# Patient Record
Sex: Female | Born: 1985 | Race: Black or African American | Hispanic: No | Marital: Married | Smoking: Former smoker
Health system: Southern US, Community
[De-identification: ages and names within clinical notes are randomized; demographics above are authoritative.]

## PROBLEM LIST (undated history)

## (undated) DIAGNOSIS — N920 Excessive and frequent menstruation with regular cycle: Secondary | ICD-10-CM

## (undated) DIAGNOSIS — R0600 Dyspnea, unspecified: Secondary | ICD-10-CM

## (undated) DIAGNOSIS — I499 Cardiac arrhythmia, unspecified: Secondary | ICD-10-CM

## (undated) DIAGNOSIS — Z5189 Encounter for other specified aftercare: Secondary | ICD-10-CM

## (undated) DIAGNOSIS — F32A Depression, unspecified: Secondary | ICD-10-CM

## (undated) DIAGNOSIS — J45909 Unspecified asthma, uncomplicated: Secondary | ICD-10-CM

## (undated) DIAGNOSIS — Z8489 Family history of other specified conditions: Secondary | ICD-10-CM

## (undated) DIAGNOSIS — G935 Compression of brain: Secondary | ICD-10-CM

## (undated) DIAGNOSIS — T8859XA Other complications of anesthesia, initial encounter: Secondary | ICD-10-CM

## (undated) DIAGNOSIS — K219 Gastro-esophageal reflux disease without esophagitis: Secondary | ICD-10-CM

## (undated) DIAGNOSIS — E236 Other disorders of pituitary gland: Secondary | ICD-10-CM

## (undated) DIAGNOSIS — F419 Anxiety disorder, unspecified: Secondary | ICD-10-CM

## (undated) DIAGNOSIS — D649 Anemia, unspecified: Secondary | ICD-10-CM

## (undated) HISTORY — DX: Anemia, unspecified: D64.9

## (undated) HISTORY — DX: Excessive and frequent menstruation with regular cycle: N92.0

## (undated) HISTORY — PX: TUBAL LIGATION: SHX77

## (undated) HISTORY — PX: CHOLECYSTECTOMY: SHX55

---
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
## 2003-03-16 ENCOUNTER — Encounter (HOSPITAL_BASED_OUTPATIENT_CLINIC_OR_DEPARTMENT_OTHER): Payer: Self-pay | Admitting: General Surgery

## 2003-03-16 ENCOUNTER — Encounter: Admission: RE | Admit: 2003-03-16 | Discharge: 2003-03-16 | Payer: Self-pay | Admitting: General Surgery

## 2003-06-08 ENCOUNTER — Inpatient Hospital Stay (HOSPITAL_COMMUNITY): Admission: AD | Admit: 2003-06-08 | Discharge: 2003-06-08 | Payer: Self-pay | Admitting: Obstetrics and Gynecology

## 2003-07-22 ENCOUNTER — Inpatient Hospital Stay (HOSPITAL_COMMUNITY): Admission: AD | Admit: 2003-07-22 | Discharge: 2003-07-22 | Payer: Self-pay | Admitting: Obstetrics & Gynecology

## 2003-12-20 ENCOUNTER — Inpatient Hospital Stay (HOSPITAL_COMMUNITY): Admission: AD | Admit: 2003-12-20 | Discharge: 2003-12-20 | Payer: Self-pay | Admitting: *Deleted

## 2003-12-21 ENCOUNTER — Inpatient Hospital Stay (HOSPITAL_COMMUNITY): Admission: AD | Admit: 2003-12-21 | Discharge: 2003-12-21 | Payer: Self-pay | Admitting: Obstetrics and Gynecology

## 2004-03-08 ENCOUNTER — Inpatient Hospital Stay (HOSPITAL_COMMUNITY): Admission: AD | Admit: 2004-03-08 | Discharge: 2004-03-08 | Payer: Self-pay | Admitting: *Deleted

## 2004-03-13 ENCOUNTER — Inpatient Hospital Stay (HOSPITAL_COMMUNITY): Admission: AD | Admit: 2004-03-13 | Discharge: 2004-03-13 | Payer: Self-pay | Admitting: *Deleted

## 2004-05-14 ENCOUNTER — Emergency Department (HOSPITAL_COMMUNITY): Admission: EM | Admit: 2004-05-14 | Discharge: 2004-05-14 | Payer: Self-pay | Admitting: Emergency Medicine

## 2004-05-30 ENCOUNTER — Ambulatory Visit (HOSPITAL_COMMUNITY): Admission: RE | Admit: 2004-05-30 | Discharge: 2004-05-30 | Payer: Self-pay | Admitting: Emergency Medicine

## 2004-05-30 ENCOUNTER — Inpatient Hospital Stay (HOSPITAL_COMMUNITY): Admission: EM | Admit: 2004-05-30 | Discharge: 2004-06-01 | Payer: Self-pay | Admitting: Emergency Medicine

## 2004-05-31 ENCOUNTER — Encounter (INDEPENDENT_AMBULATORY_CARE_PROVIDER_SITE_OTHER): Payer: Self-pay | Admitting: *Deleted

## 2004-06-02 ENCOUNTER — Emergency Department (HOSPITAL_COMMUNITY): Admission: EM | Admit: 2004-06-02 | Discharge: 2004-06-02 | Payer: Self-pay | Admitting: Family Medicine

## 2004-06-25 ENCOUNTER — Emergency Department (HOSPITAL_COMMUNITY): Admission: EM | Admit: 2004-06-25 | Discharge: 2004-06-25 | Payer: Self-pay | Admitting: Emergency Medicine

## 2004-09-12 ENCOUNTER — Emergency Department (HOSPITAL_COMMUNITY): Admission: EM | Admit: 2004-09-12 | Discharge: 2004-09-12 | Payer: Self-pay | Admitting: Family Medicine

## 2005-02-17 ENCOUNTER — Emergency Department (HOSPITAL_COMMUNITY): Admission: EM | Admit: 2005-02-17 | Discharge: 2005-02-17 | Payer: Self-pay | Admitting: Family Medicine

## 2005-02-19 ENCOUNTER — Emergency Department (HOSPITAL_COMMUNITY): Admission: EM | Admit: 2005-02-19 | Discharge: 2005-02-19 | Payer: Self-pay | Admitting: Family Medicine

## 2005-03-10 ENCOUNTER — Emergency Department (HOSPITAL_COMMUNITY): Admission: EM | Admit: 2005-03-10 | Discharge: 2005-03-10 | Payer: Self-pay | Admitting: Family Medicine

## 2005-04-19 ENCOUNTER — Inpatient Hospital Stay (HOSPITAL_COMMUNITY): Admission: AD | Admit: 2005-04-19 | Discharge: 2005-04-19 | Payer: Self-pay | Admitting: Obstetrics and Gynecology

## 2005-04-23 ENCOUNTER — Inpatient Hospital Stay (HOSPITAL_COMMUNITY): Admission: AD | Admit: 2005-04-23 | Discharge: 2005-04-23 | Payer: Self-pay | Admitting: *Deleted

## 2005-06-20 ENCOUNTER — Inpatient Hospital Stay (HOSPITAL_COMMUNITY): Admission: AD | Admit: 2005-06-20 | Discharge: 2005-06-20 | Payer: Self-pay | Admitting: Obstetrics & Gynecology

## 2005-08-30 ENCOUNTER — Inpatient Hospital Stay (HOSPITAL_COMMUNITY): Admission: AD | Admit: 2005-08-30 | Discharge: 2005-08-30 | Payer: Self-pay | Admitting: Obstetrics and Gynecology

## 2005-10-08 ENCOUNTER — Inpatient Hospital Stay (HOSPITAL_COMMUNITY): Admission: AD | Admit: 2005-10-08 | Discharge: 2005-10-08 | Payer: Self-pay | Admitting: *Deleted

## 2005-10-09 ENCOUNTER — Inpatient Hospital Stay (HOSPITAL_COMMUNITY): Admission: AD | Admit: 2005-10-09 | Discharge: 2005-10-09 | Payer: Self-pay | Admitting: Gynecology

## 2005-11-26 ENCOUNTER — Inpatient Hospital Stay (HOSPITAL_COMMUNITY): Admission: AD | Admit: 2005-11-26 | Discharge: 2005-11-26 | Payer: Self-pay | Admitting: Gynecology

## 2005-12-02 ENCOUNTER — Ambulatory Visit (HOSPITAL_COMMUNITY): Admission: RE | Admit: 2005-12-02 | Discharge: 2005-12-02 | Payer: Self-pay | Admitting: Gynecology

## 2005-12-09 ENCOUNTER — Ambulatory Visit: Payer: Self-pay | Admitting: Obstetrics & Gynecology

## 2006-01-30 ENCOUNTER — Inpatient Hospital Stay (HOSPITAL_COMMUNITY): Admission: AD | Admit: 2006-01-30 | Discharge: 2006-01-30 | Payer: Self-pay | Admitting: Obstetrics and Gynecology

## 2006-01-30 ENCOUNTER — Ambulatory Visit: Payer: Self-pay | Admitting: *Deleted

## 2006-02-27 ENCOUNTER — Ambulatory Visit: Payer: Self-pay | Admitting: Obstetrics & Gynecology

## 2006-03-26 ENCOUNTER — Inpatient Hospital Stay (HOSPITAL_COMMUNITY): Admission: AD | Admit: 2006-03-26 | Discharge: 2006-03-26 | Payer: Self-pay | Admitting: Gynecology

## 2006-04-12 ENCOUNTER — Inpatient Hospital Stay (HOSPITAL_COMMUNITY): Admission: AD | Admit: 2006-04-12 | Discharge: 2006-04-12 | Payer: Self-pay | Admitting: Obstetrics & Gynecology

## 2006-04-12 ENCOUNTER — Ambulatory Visit: Payer: Self-pay | Admitting: Obstetrics and Gynecology

## 2006-04-13 ENCOUNTER — Inpatient Hospital Stay (HOSPITAL_COMMUNITY): Admission: AD | Admit: 2006-04-13 | Discharge: 2006-04-13 | Payer: Self-pay | Admitting: Obstetrics & Gynecology

## 2006-04-13 ENCOUNTER — Ambulatory Visit: Payer: Self-pay | Admitting: Obstetrics and Gynecology

## 2006-04-14 ENCOUNTER — Inpatient Hospital Stay (HOSPITAL_COMMUNITY): Admission: AD | Admit: 2006-04-14 | Discharge: 2006-04-16 | Payer: Self-pay | Admitting: Obstetrics & Gynecology

## 2006-04-14 ENCOUNTER — Ambulatory Visit: Payer: Self-pay | Admitting: Gynecology

## 2006-06-24 ENCOUNTER — Inpatient Hospital Stay (HOSPITAL_COMMUNITY): Admission: AD | Admit: 2006-06-24 | Discharge: 2006-06-24 | Payer: Self-pay | Admitting: Family Medicine

## 2007-01-01 ENCOUNTER — Inpatient Hospital Stay (HOSPITAL_COMMUNITY): Admission: AD | Admit: 2007-01-01 | Discharge: 2007-01-01 | Payer: Self-pay | Admitting: Obstetrics and Gynecology

## 2007-02-13 ENCOUNTER — Emergency Department (HOSPITAL_COMMUNITY): Admission: EM | Admit: 2007-02-13 | Discharge: 2007-02-13 | Payer: Self-pay | Admitting: Emergency Medicine

## 2007-06-08 ENCOUNTER — Emergency Department (HOSPITAL_COMMUNITY): Admission: EM | Admit: 2007-06-08 | Discharge: 2007-06-08 | Payer: Self-pay | Admitting: Family Medicine

## 2007-07-29 ENCOUNTER — Inpatient Hospital Stay (HOSPITAL_COMMUNITY): Admission: AD | Admit: 2007-07-29 | Discharge: 2007-07-29 | Payer: Self-pay | Admitting: Obstetrics and Gynecology

## 2007-12-10 ENCOUNTER — Emergency Department (HOSPITAL_COMMUNITY): Admission: EM | Admit: 2007-12-10 | Discharge: 2007-12-10 | Payer: Self-pay | Admitting: Family Medicine

## 2008-01-21 ENCOUNTER — Emergency Department (HOSPITAL_COMMUNITY): Admission: EM | Admit: 2008-01-21 | Discharge: 2008-01-21 | Payer: Self-pay | Admitting: Family Medicine

## 2008-01-22 ENCOUNTER — Emergency Department (HOSPITAL_COMMUNITY): Admission: EM | Admit: 2008-01-22 | Discharge: 2008-01-22 | Payer: Self-pay | Admitting: Emergency Medicine

## 2008-02-06 ENCOUNTER — Inpatient Hospital Stay (HOSPITAL_COMMUNITY): Admission: AD | Admit: 2008-02-06 | Discharge: 2008-02-06 | Payer: Self-pay | Admitting: Family Medicine

## 2008-02-08 ENCOUNTER — Inpatient Hospital Stay (HOSPITAL_COMMUNITY): Admission: AD | Admit: 2008-02-08 | Discharge: 2008-02-08 | Payer: Self-pay | Admitting: Family Medicine

## 2008-02-10 ENCOUNTER — Inpatient Hospital Stay (HOSPITAL_COMMUNITY): Admission: AD | Admit: 2008-02-10 | Discharge: 2008-02-10 | Payer: Self-pay | Admitting: Family Medicine

## 2008-02-10 ENCOUNTER — Encounter: Payer: Self-pay | Admitting: Family Medicine

## 2008-03-30 ENCOUNTER — Emergency Department (HOSPITAL_COMMUNITY): Admission: EM | Admit: 2008-03-30 | Discharge: 2008-03-30 | Payer: Self-pay | Admitting: Family Medicine

## 2008-07-17 ENCOUNTER — Emergency Department (HOSPITAL_COMMUNITY): Admission: EM | Admit: 2008-07-17 | Discharge: 2008-07-17 | Payer: Self-pay | Admitting: Family Medicine

## 2009-04-11 ENCOUNTER — Inpatient Hospital Stay (HOSPITAL_COMMUNITY): Admission: AD | Admit: 2009-04-11 | Discharge: 2009-04-13 | Payer: Self-pay | Admitting: Obstetrics and Gynecology

## 2009-04-12 ENCOUNTER — Encounter (INDEPENDENT_AMBULATORY_CARE_PROVIDER_SITE_OTHER): Payer: Self-pay | Admitting: Obstetrics and Gynecology

## 2009-06-13 ENCOUNTER — Emergency Department (HOSPITAL_COMMUNITY): Admission: EM | Admit: 2009-06-13 | Discharge: 2009-06-13 | Payer: Self-pay | Admitting: Emergency Medicine

## 2009-06-28 ENCOUNTER — Emergency Department (HOSPITAL_COMMUNITY): Admission: EM | Admit: 2009-06-28 | Discharge: 2009-06-28 | Payer: Self-pay | Admitting: Emergency Medicine

## 2009-11-09 ENCOUNTER — Emergency Department (HOSPITAL_COMMUNITY): Admission: EM | Admit: 2009-11-09 | Discharge: 2009-11-09 | Payer: Self-pay | Admitting: Emergency Medicine

## 2009-12-09 ENCOUNTER — Emergency Department (HOSPITAL_COMMUNITY): Admission: EM | Admit: 2009-12-09 | Discharge: 2009-12-09 | Payer: Self-pay | Admitting: Family Medicine

## 2009-12-26 ENCOUNTER — Emergency Department (HOSPITAL_COMMUNITY): Admission: EM | Admit: 2009-12-26 | Discharge: 2009-12-26 | Payer: Self-pay | Admitting: Emergency Medicine

## 2010-04-16 ENCOUNTER — Emergency Department (HOSPITAL_COMMUNITY): Admission: EM | Admit: 2010-04-16 | Discharge: 2010-04-16 | Payer: Self-pay | Admitting: Emergency Medicine

## 2010-05-28 ENCOUNTER — Emergency Department (HOSPITAL_COMMUNITY)
Admission: EM | Admit: 2010-05-28 | Discharge: 2010-05-28 | Payer: Self-pay | Source: Home / Self Care | Admitting: Emergency Medicine

## 2010-07-16 ENCOUNTER — Encounter: Payer: Self-pay | Admitting: Family Medicine

## 2010-08-16 ENCOUNTER — Emergency Department (HOSPITAL_COMMUNITY)
Admission: EM | Admit: 2010-08-16 | Discharge: 2010-08-16 | Disposition: A | Discharge status: Home / Self Care | Payer: Medicaid Other | Attending: Emergency Medicine | Admitting: Emergency Medicine

## 2010-08-16 DIAGNOSIS — X503XXA Overexertion from repetitive movements, initial encounter: Secondary | ICD-10-CM | POA: Insufficient documentation

## 2010-08-16 DIAGNOSIS — M545 Low back pain, unspecified: Secondary | ICD-10-CM | POA: Insufficient documentation

## 2010-08-16 DIAGNOSIS — S335XXA Sprain of ligaments of lumbar spine, initial encounter: Secondary | ICD-10-CM | POA: Insufficient documentation

## 2010-08-16 DIAGNOSIS — K219 Gastro-esophageal reflux disease without esophagitis: Secondary | ICD-10-CM | POA: Insufficient documentation

## 2010-08-16 DIAGNOSIS — Y990 Civilian activity done for income or pay: Secondary | ICD-10-CM | POA: Insufficient documentation

## 2010-09-09 LAB — POCT PREGNANCY, URINE: Preg Test, Ur: NEGATIVE

## 2010-09-10 LAB — RAPID STREP SCREEN (MED CTR MEBANE ONLY): Streptococcus, Group A Screen (Direct): NEGATIVE

## 2010-09-27 LAB — CBC
HCT: 34 % — ABNORMAL LOW (ref 36.0–46.0)
Hemoglobin: 11.2 g/dL — ABNORMAL LOW (ref 12.0–15.0)
MCHC: 32.9 g/dL (ref 30.0–36.0)
Platelets: 165 10*3/uL (ref 150–400)
RBC: 4.58 MIL/uL (ref 3.87–5.11)
WBC: 5.6 10*3/uL (ref 4.0–10.5)
WBC: 5.9 10*3/uL (ref 4.0–10.5)

## 2010-09-27 LAB — RPR: RPR Ser Ql: NONREACTIVE

## 2010-11-09 NOTE — Op Note (Signed)
NAMESUSSIE, MINOR           ACCOUNT NO.:  [id number]   MEDICAL RECORD NO.:  [id number]          PATIENT TYPE:  OUT   LOCATION:  ULT                          FACILITY:  [facility]   PHYSICIAN:  Sandria Bales. Ezzard Standing, M.D.  DATE OF BIRTH:  July 29, 1985   DATE OF PROCEDURE:  05/31/2004  DATE OF DISCHARGE:                                 OPERATIVE REPORT   PREOPERATIVE DIAGNOSIS:  Cholecystitis with cholelithiasis.   POSTOPERATIVE DIAGNOSIS:  Cholecystitis with cholelithiasis.   PROCEDURE:  Laparoscopic cholecystectomy with interoperative cholangiogram,  common bile duct exploration with #4 Fogarty catheter.   SURGEON:  Sandria Bales. Ezzard Standing, M.D.   ASSISTANT:  Gabrielle Dare. Janee Morn, M.D.   ANESTHESIA:  General endotracheal anesthesia.   ESTIMATED BLOOD LOSS:  Minimal.   INDICATIONS FOR PROCEDURE:  Ms. Kamen is a 25 year old black female who  was admitted last evening by Dr. Jimmye Norman with right upper quadrant pain,  cholelithiasis by ultrasound, signs and symptoms consistent with acute  cholecystitis and cholelithiasis.  Dr. Lindie Spruce was tied up this morning and  asked if I could do the operation.   DESCRIPTION OF PROCEDURE:  The patient presents to the operating room and  underwent a general endotracheal anesthetic by Dr. Jairo Ben.  She  was given 1 gram Ancef at the initiation of the procedure.  She had PA  stockings in place.  The abdomen was prepped with Betadine solution and  sterilely draped.  I went through an infraumbilical incision with sharp  dissection carried down to the abdominal cavity.  A 0 degree 10 mm Hasson  trocar was inserted through the umbilical incision and secured with a 0  Vicryl suture.  A 10 mm 0 degree laparoscope was inserted into the abdomen  and abdominal exploration carried out.   Right and left lobes of the liver were unremarkable.  The stomach was  unremarkable.  The remainder of the bowel that I could see was unremarkable.  Three additional  trocars were placed, a 10 mm subxiphoid trocar, a 5 mm  right subcostal, and 5 mm right subcostal.  The gallbladder was grasped and  rotated cephalad.  It was tense and maybe mildly edematous.  What was  impressive was actually the size of her common bile duct which I could see  laparoscopically.  I went on and dissected out the cystic artery which I  doubly endoclipped and divided.  I dissected out the cystic duct and placed  a clip on the gallbladder side of the cystic duct.   I shot an interoperative cholangiogram.  My first interoperative  cholangiogram did not show emptying in the common bile duct, actually the  common bile duct did not empty.  I gave her a mg of Glucagon, waited 10  minutes, shot another cholangiogram, again, the common bile duct did not  empty.  I then used a #4 Fogarty catheter that I passed down into the  duodenum and pulled it back through the ampulla.  At this time, I shot a  cholangiogram that showed, again, dilated common bile duct that emptied into  the duodenum.  The ampulla was fairly  long, it emptied to the third portion  of the duodenum and there was no filling debris noted.  This was felt to be  a normal interoperative cholangiogram.   The cystic duct was endoclipped and divided.  The gallbladder  was then  sharply and bluntly dissected from the gallbladder bed using primarily hook  Bovie coagulation.  Prior to complete division of the gallbladder from the  gallbladder bed, I revisualized the triangle of Calot, revisualized the  gallbladder bed, there is no bleeding and no bile leak.  I then divided the  gallbladder from the liver, placed it in an endocatch bag, and delivered it  through the umbilicus.  The abdomen was irrigated with about 2 liters of  saline.  I resinpected the bed and triangle of Calot and saw no bleeding or  bile leak.   The umbilical port was then closed with a 0 Vicryl suture.  I injected about  15 mL of  0.25% Marcaine.  The  skin at each site was closed with 5-0 Vicryl,  painted with tincture of Benzoin, and covered with Steri-Strips.  The  patient tolerated the procedure well and was transported to the recovery  room in good condition.  Sponge and needle counts were correct at the end of  the case.      Pervis Hocking   DHN/MEDQ  D:  05/31/2004  T:  05/31/2004  Job:  [id number]

## 2010-11-09 NOTE — Discharge Summary (Signed)
Breanna Miranda, Breanna Miranda           ACCOUNT NO.:  [id number]   MEDICAL RECORD NO.:  [id number]          PATIENT TYPE:  INP   LOCATION:  5731                         FACILITY:  [facility]   PHYSICIAN:  Sandria Bales. Ezzard Standing, M.D.  DATE OF BIRTH:  28-Oct-1985   DATE OF ADMISSION:  05/30/2004  DATE OF DISCHARGE:  06/01/2004                                 DISCHARGE SUMMARY   DISCHARGE DIAGNOSES:  1.  Cholecystitis with cholelithiasis.  2.  Common bile duct stone.   OPERATION PERFORMED:  Laparoscopic cholecystectomy with intraoperative  cholangiogram and common bile duct exploration with #4 biliary Fogarty on  05/31/2004.   HISTORY OF ILLNESS:  This is a 25 year old black female who was admitted on  05/30/2004, with biliary colic, cholelithiasis, right upper quadrant pain,  and signs and symptoms consistent with cholecystitis.   She had normal liver functions at that time.  She was originally admitted by  Dr. Megan Mans.  He was occupied and asked if I could proceed to do the case.   HOSPITAL COURSE:  I met and saw the patient on 05/31/2004, at which time, I  took her to the operating room.  She underwent a laparoscopic  cholecystectomy with intraoperative cholangiogram.  Postoperatively, she did  well.  By the first postoperative day, she was sore but afebrile.  Her  abdomen was soft, and she was ready for discharge.   DISCHARGE INSTRUCTIONS:  Vicodin for pain.  She is to return to the office  in two to three weeks.  Low-fat diet.   DISCHARGE CONDITION:  Good.   OF NOTE, THERE IS NO PATHOLOGY REPORT ON THE CHART AT THE TIME OF THIS  DICTATION.      DHN/MEDQ  D:  08/23/2004  T:  08/23/2004  Job:  [id number]

## 2010-11-09 NOTE — H&P (Signed)
NAMESICILY, ZARAGOZA           ACCOUNT NO.:  [id number]   MEDICAL RECORD NO.:  [id number]          PATIENT TYPE:  OUT   LOCATION:  ULT                          FACILITY:  [facility]   PHYSICIAN:  Jimmye Norman III, M.D.  DATE OF BIRTH:  Jul 09, 1985   DATE OF ADMISSION:  05/30/2004  DATE OF DISCHARGE:                                HISTORY & PHYSICAL   IDENTIFICATION/CHIEF COMPLAINT:  The patient is a 25 year old with biliary  colic and possibly early acute cholecystitis.   HISTORY OF PRESENT ILLNESS:  The patient has been having symptoms for about  24-36 hours.  It started off in the epigastrium with what she thought was  her gastroesophageal reflux disease problem which she does take intermittent  medications for; however, over the next day it did not subside and got to  the point where it was bad enough for her to go into the [facility]  on [facility].  There an ultrasound was done which showed  cholelithiasis, perhaps an impaction in the gallbladder wall, and maybe some  early acute cholecystitis changes.  Surgery was asked to see her.   PAST MEDICAL HISTORY:  1.  Unremarkable for hypertension, diabetes, heart disease.  2.  It is questionable whether or not she is a smoker.  3.  She has had no surgery.  4.  She is fertile, has two children, one 3 and one 1, both were delivered      by a normal spontaneous vaginal delivery.   PREVIOUS HOSPITALIZATION:  She was apparently hospitalized once before for  her GERD disease but otherwise has been fine.   MEDICATIONS:  She takes no medications normally.   She has no known drug allergies.   REVIEW OF SYSTEMS:  She has had nausea and vomiting and also severe pain.  No constipation, no blood in the stools, no chest pain, no shortness of  breath.   PHYSICAL EXAMINATION:  VITAL SIGNS:  She is afebrile.  Her other vital signs  are stable.  HEENT:  She is normocephalic and atraumatic.  She has no scleral icterus.  NECK:  Supple with no palpable masses and no bruits.  CHEST:  Clear to auscultation and percussion.  CARDIAC:  Regular rhythm and rate with no murmurs, gallops, __________  or  heaves.  ABDOMEN:  Soft but she is tender in the right upper quadrant.  She has  active bowel sounds.  PELVIC:  Not performed.  RECTAL:  Not performed.   LABORATORY STUDIES:  Were reviewed.  Her hemoglobin was in the 11-12 range.  Her hematocrit was about 34%.  Her liver function tests were normal as were  her amylase and lipase.  Her white blood cell count was only 5.2.   IMPRESSION:  1.  Symptomatic cholelithiasis, biliary colic, and possibly early acute      cholecystitis in an otherwise healthy 25 year old female.  2.  She is moderately obese also.   PLAN:  1.  Admit her.  2.  Start her on IV antibiotics.  3.  Take out her gallbladder laparoscopically as soon as possible.  JW/MEDQ  D:  05/31/2004  T:  05/31/2004  Job:  [id number]

## 2011-01-24 ENCOUNTER — Inpatient Hospital Stay (INDEPENDENT_AMBULATORY_CARE_PROVIDER_SITE_OTHER)
Admission: RE | Admit: 2011-01-24 | Discharge: 2011-01-24 | Disposition: A | Discharge status: Home / Self Care | Payer: Self-pay | Source: Ambulatory Visit | Attending: Family Medicine | Admitting: Family Medicine

## 2011-01-24 DIAGNOSIS — T148 Other injury of unspecified body region: Secondary | ICD-10-CM

## 2011-01-24 DIAGNOSIS — L259 Unspecified contact dermatitis, unspecified cause: Secondary | ICD-10-CM

## 2011-03-15 LAB — POCT PREGNANCY, URINE
Operator id: 280671
Preg Test, Ur: NEGATIVE

## 2011-03-15 LAB — WET PREP, GENITAL: Trich, Wet Prep: NONE SEEN

## 2011-03-15 LAB — URINALYSIS, ROUTINE W REFLEX MICROSCOPIC
Bilirubin Urine: NEGATIVE
Nitrite: NEGATIVE
Specific Gravity, Urine: 1.01
pH: 6.5

## 2011-03-15 LAB — GC/CHLAMYDIA PROBE AMP, GENITAL: GC Probe Amp, Genital: NEGATIVE

## 2011-03-21 LAB — POCT URINALYSIS DIP (DEVICE)
Glucose, UA: NEGATIVE
Operator id: 247071
pH: 7

## 2011-03-21 LAB — POCT PREGNANCY, URINE: Operator id: 247071

## 2011-03-21 LAB — WET PREP, GENITAL

## 2011-03-21 LAB — URINE CULTURE: Colony Count: 50000

## 2011-03-22 LAB — GC/CHLAMYDIA PROBE AMP, GENITAL: GC Probe Amp, Genital: POSITIVE — AB

## 2011-03-22 LAB — URINE CULTURE: Colony Count: >=100000

## 2011-03-22 LAB — POCT URINALYSIS DIP (DEVICE)
Bilirubin Urine: NEGATIVE
Glucose, UA: NEGATIVE
Nitrite: POSITIVE — AB
Operator id: 235561
Urobilinogen, UA: 0.2

## 2011-03-22 LAB — WET PREP, GENITAL
Clue Cells Wet Prep HPF POC: NONE SEEN
Trich, Wet Prep: NONE SEEN
Yeast Wet Prep HPF POC: NONE SEEN

## 2011-03-22 LAB — POCT PREGNANCY, URINE: Preg Test, Ur: POSITIVE

## 2011-04-05 LAB — RAPID STREP SCREEN (MED CTR MEBANE ONLY): Streptococcus, Group A Screen (Direct): NEGATIVE

## 2011-04-09 LAB — CBC
HCT: 38.6
Platelets: 237
RDW: 14.6 — ABNORMAL HIGH
WBC: 5.7

## 2011-04-09 LAB — DIFFERENTIAL
Basophils Absolute: 0
Eosinophils Relative: 2
Lymphocytes Relative: 32
Lymphs Abs: 1.8
Neutro Abs: 3.1

## 2011-04-09 LAB — GC/CHLAMYDIA PROBE AMP, GENITAL
Chlamydia, DNA Probe: NEGATIVE
GC Probe Amp, Genital: POSITIVE — AB

## 2011-04-09 LAB — POCT PREGNANCY, URINE: Operator id: 120561

## 2011-04-09 LAB — WET PREP, GENITAL: Trich, Wet Prep: NONE SEEN

## 2011-04-26 ENCOUNTER — Inpatient Hospital Stay (INDEPENDENT_AMBULATORY_CARE_PROVIDER_SITE_OTHER)
Admission: RE | Admit: 2011-04-26 | Discharge: 2011-04-26 | Disposition: A | Discharge status: Home / Self Care | Payer: Self-pay | Source: Ambulatory Visit | Attending: Family Medicine | Admitting: Family Medicine

## 2011-04-26 DIAGNOSIS — A5901 Trichomonal vulvovaginitis: Secondary | ICD-10-CM

## 2011-04-26 LAB — POCT URINALYSIS DIP (DEVICE)
Bilirubin Urine: NEGATIVE
Glucose, UA: NEGATIVE mg/dL
Hgb urine dipstick: NEGATIVE
Ketones, ur: NEGATIVE mg/dL
Leukocytes, UA: NEGATIVE
Nitrite: NEGATIVE
Protein, ur: NEGATIVE mg/dL
Specific Gravity, Urine: >=1.03 (ref 1.005–1.030)
Urobilinogen, UA: 1 mg/dL (ref 0.0–1.0)
pH: 6 (ref 5.0–8.0)

## 2011-04-26 LAB — WET PREP, GENITAL: Yeast Wet Prep HPF POC: NONE SEEN

## 2011-04-27 LAB — GC/CHLAMYDIA PROBE AMP, GENITAL
Chlamydia, DNA Probe: NEGATIVE
GC Probe Amp, Genital: NEGATIVE

## 2011-05-01 ENCOUNTER — Telehealth (HOSPITAL_COMMUNITY): Payer: Self-pay | Admitting: *Deleted

## 2011-05-01 NOTE — ED Notes (Signed)
The patient called for her lab results. Pt.verified x 2 and given neg. results. ( GC neg., Chlamydia neg.,  Wet prep: mod. clue cells.)

## 2011-06-18 ENCOUNTER — Emergency Department (HOSPITAL_COMMUNITY)
Admission: EM | Admit: 2011-06-18 | Discharge: 2011-06-18 | Disposition: A | Discharge status: Home / Self Care | Payer: Self-pay | Attending: Emergency Medicine | Admitting: Emergency Medicine

## 2011-06-18 ENCOUNTER — Encounter: Payer: Self-pay | Admitting: *Deleted

## 2011-06-18 DIAGNOSIS — B349 Viral infection, unspecified: Secondary | ICD-10-CM

## 2011-06-18 DIAGNOSIS — R51 Headache: Secondary | ICD-10-CM | POA: Insufficient documentation

## 2011-06-18 DIAGNOSIS — R42 Dizziness and giddiness: Secondary | ICD-10-CM | POA: Insufficient documentation

## 2011-06-18 DIAGNOSIS — B9789 Other viral agents as the cause of diseases classified elsewhere: Secondary | ICD-10-CM | POA: Insufficient documentation

## 2011-06-18 DIAGNOSIS — J3489 Other specified disorders of nose and nasal sinuses: Secondary | ICD-10-CM | POA: Insufficient documentation

## 2011-06-18 DIAGNOSIS — R5383 Other fatigue: Secondary | ICD-10-CM | POA: Insufficient documentation

## 2011-06-18 DIAGNOSIS — IMO0001 Reserved for inherently not codable concepts without codable children: Secondary | ICD-10-CM | POA: Insufficient documentation

## 2011-06-18 DIAGNOSIS — R070 Pain in throat: Secondary | ICD-10-CM | POA: Insufficient documentation

## 2011-06-18 DIAGNOSIS — R5381 Other malaise: Secondary | ICD-10-CM | POA: Insufficient documentation

## 2011-06-18 MED ORDER — IBUPROFEN 800 MG PO TABS
800.0000 mg | ORAL_TABLET | Freq: Once | ORAL | Status: AC
  Administered 2011-06-18: 800 mg via ORAL
  Filled 2011-06-18: qty 1

## 2011-06-18 NOTE — ED Provider Notes (Signed)
History     CSN: [id number]  Arrival date & time 06/18/11  1416   First MD Initiated Contact with Patient 06/18/11 1519      Chief Complaint  Patient presents with  . Headache  . Dizziness    (Consider location/radiation/quality/duration/timing/severity/associated sxs/prior treatment) HPI Comments: Presents with diffuse headache, body aches, lightheadedness, rhinorrhea and sore throat for the past 2 days. She does have exposure to flu. She has not checked her temperature at home. She denies any nausea, vomiting, abdominal pain, chest pain or shortness of breath. Is not have any rash. Has good by mouth intake and urine output. Been taking Delsym for her symptoms without relief. She's not take anything for the headache or body aches. Endorses a feeling of lightheadedness but denies vertigo.  The history is provided by the patient.    History reviewed. No pertinent past medical history.  History reviewed. No pertinent past surgical history.  History reviewed. No pertinent family history.  History  Substance Use Topics  . Smoking status: Former Games developer  . Smokeless tobacco: Not on file  . Alcohol Use: No    OB History    Grav Para Term Preterm Abortions TAB SAB Ect Mult Living                  Review of Systems  Constitutional: Positive for fatigue. Negative for fever, activity change and appetite change.  HENT: Positive for congestion, sore throat and rhinorrhea.   Respiratory: Positive for cough. Negative for chest tightness and shortness of breath.   Cardiovascular: Negative for chest pain.  Gastrointestinal: Negative for nausea, vomiting and abdominal pain.  Genitourinary: Negative for dysuria and hematuria.  Musculoskeletal: Negative for back pain.  Skin: Negative for rash.  Neurological: Positive for dizziness, light-headedness and headaches.    Allergies  Review of patient's allergies indicates no known allergies.  Home Medications   Current Outpatient Rx    Name Route Sig Dispense Refill  . DEXTROMETHORPHAN POLISTIREX ER 30 MG/5ML PO LQCR Oral Take 60 mg by mouth as needed. cough       BP 109/60  Pulse 88  Temp(Src) 99.5 F (37.5 C) (Oral)  Resp 18  SpO2 99%  LMP 06/07/2011  Physical Exam  Constitutional: She is oriented to person, place, and time. She appears well-developed and well-nourished. No distress.  HENT:  Head: Normocephalic and atraumatic.  Mouth/Throat: Oropharynx is clear and moist. No oropharyngeal exudate.       Well-groomed with fake eyelashes and makeup  Eyes: Conjunctivae are normal. Pupils are equal, round, and reactive to light.  Neck: Normal range of motion. Neck supple.       No meningismus  Cardiovascular: Normal rate, regular rhythm and normal heart sounds.   Pulmonary/Chest: Effort normal and breath sounds normal. She has no wheezes.  Abdominal: Soft. There is no tenderness. There is no rebound and no guarding.  Musculoskeletal: Normal range of motion. She exhibits no edema and no tenderness.  Neurological: She is alert and oriented to person, place, and time. No cranial nerve deficit.  Skin: Skin is warm.    ED Course  Procedures (including critical care time)   Labs Reviewed  RAPID STREP SCREEN   No results found.   1. Viral syndrome       MDM  Viral syndrome with body aches, headache, lightheadedness, cough and sore throat. Vitals stable patient no distress.  Supportive care, antipyretics, PO hydration.       Glynn Octave, MD 06/18/11 [phone]

## 2011-06-18 NOTE — ED Notes (Signed)
Pt c/o sore throat and body aches x's 3 days.  Skin warm and dry, color appropriate

## 2011-06-18 NOTE — ED Notes (Signed)
Reports generalized bodyaches, dizziness, headache x 2 days.

## 2011-09-10 ENCOUNTER — Emergency Department (HOSPITAL_COMMUNITY)
Admission: EM | Admit: 2011-09-10 | Discharge: 2011-09-11 | Disposition: A | Discharge status: Home / Self Care | Payer: Self-pay | Attending: Emergency Medicine | Admitting: Emergency Medicine

## 2011-09-10 ENCOUNTER — Encounter (HOSPITAL_COMMUNITY): Payer: Self-pay | Admitting: *Deleted

## 2011-09-10 DIAGNOSIS — R1013 Epigastric pain: Secondary | ICD-10-CM | POA: Insufficient documentation

## 2011-09-10 DIAGNOSIS — R110 Nausea: Secondary | ICD-10-CM | POA: Insufficient documentation

## 2011-09-10 DIAGNOSIS — R197 Diarrhea, unspecified: Secondary | ICD-10-CM | POA: Insufficient documentation

## 2011-09-10 DIAGNOSIS — A084 Viral intestinal infection, unspecified: Secondary | ICD-10-CM

## 2011-09-10 DIAGNOSIS — R630 Anorexia: Secondary | ICD-10-CM | POA: Insufficient documentation

## 2011-09-10 DIAGNOSIS — M549 Dorsalgia, unspecified: Secondary | ICD-10-CM | POA: Insufficient documentation

## 2011-09-10 DIAGNOSIS — A088 Other specified intestinal infections: Secondary | ICD-10-CM | POA: Insufficient documentation

## 2011-09-10 LAB — DIFFERENTIAL
Eosinophils Absolute: 0.1 10*3/uL (ref 0.0–0.7)
Eosinophils Relative: 1 % (ref 0–5)
Lymphocytes Relative: 37 % (ref 12–46)
Lymphs Abs: 2.8 10*3/uL (ref 0.7–4.0)
Monocytes Relative: 7 % (ref 3–12)

## 2011-09-10 LAB — CBC
Hemoglobin: 9.6 g/dL — ABNORMAL LOW (ref 12.0–15.0)
MCH: 21.1 pg — ABNORMAL LOW (ref 26.0–34.0)
MCV: 66.5 fL — ABNORMAL LOW (ref 78.0–100.0)
RBC: 4.54 MIL/uL (ref 3.87–5.11)

## 2011-09-10 MED ORDER — ONDANSETRON HCL 4 MG/2ML IJ SOLN
4.0000 mg | Freq: Once | INTRAMUSCULAR | Status: AC
  Administered 2011-09-10: 4 mg via INTRAVENOUS
  Filled 2011-09-10: qty 2

## 2011-09-10 MED ORDER — ONDANSETRON HCL 4 MG/2ML IJ SOLN
INTRAMUSCULAR | Status: AC
  Administered 2011-09-10
  Filled 2011-09-10: qty 2

## 2011-09-10 MED ORDER — GI COCKTAIL ~~LOC~~
30.0000 mL | Freq: Once | ORAL | Status: AC
  Administered 2011-09-10: 30 mL via ORAL
  Filled 2011-09-10: qty 30

## 2011-09-10 MED ORDER — SODIUM CHLORIDE 0.9 % IV BOLUS (SEPSIS)
1000.0000 mL | Freq: Once | INTRAVENOUS | Status: AC
  Administered 2011-09-10: 500 mL via INTRAVENOUS

## 2011-09-10 MED ORDER — HYDROMORPHONE HCL PF 1 MG/ML IJ SOLN
1.0000 mg | Freq: Once | INTRAMUSCULAR | Status: AC
  Administered 2011-09-10: 1 mg via INTRAVENOUS
  Filled 2011-09-10: qty 1

## 2011-09-10 MED ORDER — PANTOPRAZOLE SODIUM 40 MG IV SOLR
40.0000 mg | Freq: Once | INTRAVENOUS | Status: AC
  Administered 2011-09-10: 40 mg via INTRAVENOUS
  Filled 2011-09-10: qty 40

## 2011-09-10 NOTE — ED Notes (Signed)
Per EMS: pt c/o abdominal pain x 1 hour (epigastric and through to back), diarrhea x 1 day.  Nausea with no vomiting, last ate yesterday evening.  20g LAC

## 2011-09-10 NOTE — ED Provider Notes (Signed)
History     CSN: [id number]  Arrival date & time 09/10/11  2331   First MD Initiated Contact with Patient 09/10/11 2333      Chief Complaint  Patient presents with  . Abdominal Pain    (Consider location/radiation/quality/duration/timing/severity/associated sxs/prior treatment) Patient is a 26 y.o. female presenting with abdominal pain. The history is provided by the patient. No language interpreter was used.  Abdominal Pain The primary symptoms of the illness include abdominal pain, nausea and diarrhea. The primary symptoms of the illness do not include fever, fatigue, shortness of breath, vomiting or dysuria. The current episode started 2 days ago. The onset of the illness was gradual. The problem has been gradually worsening.  The pain came on gradually. The abdominal pain has been gradually worsening since its onset. The abdominal pain is located in the epigastric region. The abdominal pain radiates to the back. The abdominal pain is relieved by nothing. The abdominal pain is exacerbated by vomiting.  Nausea began today.  The diarrhea began 2 days ago. The diarrhea is watery. The diarrhea occurs 2 to 4 times per day.  The patient states that she believes she is currently not pregnant. The patient has not had a change in bowel habit. Additional symptoms associated with the illness include anorexia. Symptoms associated with the illness do not include chills, urgency, frequency or back pain.    History reviewed. No pertinent past medical history.  Past Surgical History  Procedure Date  . Cholecystectomy   . Tubal ligation     History reviewed. No pertinent family history.  History  Substance Use Topics  . Smoking status: Former Games developer  . Smokeless tobacco: Not on file  . Alcohol Use: No    OB History    Grav Para Term Preterm Abortions TAB SAB Ect Mult Living                  Review of Systems  Constitutional: Negative for fever, chills, activity change, appetite  change and fatigue.  HENT: Negative for congestion, sore throat, rhinorrhea, neck pain and neck stiffness.   Respiratory: Negative for cough and shortness of breath.   Cardiovascular: Negative for chest pain and palpitations.  Gastrointestinal: Positive for nausea, abdominal pain, diarrhea and anorexia. Negative for vomiting and blood in stool.  Genitourinary: Negative for dysuria, urgency, frequency and flank pain.  Musculoskeletal: Negative for myalgias, back pain and arthralgias.  Neurological: Negative for dizziness, weakness, light-headedness, numbness and headaches.  All other systems reviewed and are negative.    Allergies  Review of patient's allergies indicates no known allergies.  Home Medications   Current Outpatient Rx  Name Route Sig Dispense Refill  . HYDROCODONE-ACETAMINOPHEN 5-500 MG PO TABS Oral Take 1-2 tablets by mouth every 6 (six) hours as needed for pain. 15 tablet 0  . ONDANSETRON HCL 4 MG PO TABS Oral Take 1 tablet (4 mg total) by mouth every 6 (six) hours. 12 tablet 0    BP 103/65  Pulse 80  Temp(Src) 97.8 F (36.6 C) (Oral)  Resp 15  SpO2 100%  Physical Exam  Nursing note and vitals reviewed. Constitutional: She is oriented to person, place, and time. She appears well-developed and well-nourished.       Appears uncomfortable  HENT:  Head: Normocephalic and atraumatic.  Mouth/Throat: Oropharynx is clear and moist.  Eyes: Conjunctivae and EOM are normal. Pupils are equal, round, and reactive to light.  Neck: Normal range of motion. Neck supple.  Cardiovascular: Normal rate, regular  rhythm, normal heart sounds and intact distal pulses.  Exam reveals no gallop and no friction rub.   No murmur heard. Pulmonary/Chest: Effort normal and breath sounds normal. No respiratory distress. She exhibits no tenderness.  Abdominal: Soft. Bowel sounds are normal. There is tenderness (epigastric abdominal tenderness ). There is guarding (voluntary by grabbing examiner  hand during PE). There is no rebound.  Genitourinary: Guaiac negative stool.  Musculoskeletal: Normal range of motion. She exhibits no tenderness.  Neurological: She is alert and oriented to person, place, and time. No cranial nerve deficit.  Skin: Skin is warm and dry. No rash noted.    ED Course  Procedures (including critical care time)   Date: 09/10/2011  Rate: 68  Rhythm: normal sinus rhythm  QRS Axis: normal  Intervals: normal  ST/T Wave abnormalities: normal  Conduction Disutrbances:none  Narrative Interpretation:   Old EKG Reviewed: none available  Labs Reviewed  URINALYSIS, ROUTINE W REFLEX MICROSCOPIC - Abnormal; Notable for the following:    APPearance CLOUDY (*)    Protein, ur 30 (*)    Leukocytes, UA SMALL (*)    All other components within normal limits  COMPREHENSIVE METABOLIC PANEL - Abnormal; Notable for the following:    Potassium 3.2 (*)    Glucose, Bld 170 (*)    AST 44 (*)    All other components within normal limits  CBC - Abnormal; Notable for the following:    Hemoglobin 9.6 (*)    HCT 30.2 (*)    MCV 66.5 (*)    MCH 21.1 (*)    RDW 16.0 (*)    All other components within normal limits  URINE MICROSCOPIC-ADD ON - Abnormal; Notable for the following:    Squamous Epithelial / LPF MANY (*)    All other components within normal limits  LIPASE, BLOOD  DIFFERENTIAL  POCT PREGNANCY, URINE   AAS negative for acute process  No results found.   1. Abdominal pain   2. Viral gastroenteritis       MDM  Symptoms likely secondary to viral enteritis. Symptoms improved after treatment in emergency Department pain medication and antiemetics. Just received IV fluids. Encouraged to continue treatment at home with pain medication, antiemetics, aggressive oral hydration. She anemia on her laboratory studies however there is no evidence of blood in her stool. I encouraged her to followup with her primary care physician for further evaluation of this  matter.        Dayton Bailiff, MD 09/11/11 [phone]

## 2011-09-11 ENCOUNTER — Emergency Department (HOSPITAL_COMMUNITY): Payer: Self-pay

## 2011-09-11 LAB — COMPREHENSIVE METABOLIC PANEL
AST: 44 U/L — ABNORMAL HIGH (ref 0–37)
Albumin: 3.5 g/dL (ref 3.5–5.2)
Calcium: 8.7 mg/dL (ref 8.4–10.5)
Creatinine, Ser: 0.73 mg/dL (ref 0.50–1.10)
Total Protein: 7 g/dL (ref 6.0–8.3)

## 2011-09-11 LAB — URINALYSIS, ROUTINE W REFLEX MICROSCOPIC
Glucose, UA: NEGATIVE mg/dL
Nitrite: NEGATIVE
Protein, ur: 30 mg/dL — AB
Urobilinogen, UA: 1 mg/dL (ref 0.0–1.0)

## 2011-09-11 LAB — LIPASE, BLOOD: Lipase: 38 U/L (ref 11–59)

## 2011-09-11 LAB — URINE MICROSCOPIC-ADD ON

## 2011-09-11 MED ORDER — HYDROCODONE-ACETAMINOPHEN 5-500 MG PO TABS: 1.0000 | ORAL_TABLET | Freq: Four times a day (QID) | ORAL | Status: AC | PRN

## 2011-09-11 MED ORDER — ONDANSETRON HCL 4 MG/2ML IJ SOLN
4.0000 mg | Freq: Once | INTRAMUSCULAR | Status: DC
  Filled 2011-09-11: qty 2

## 2011-09-11 MED ORDER — HYDROMORPHONE HCL PF 1 MG/ML IJ SOLN
1.0000 mg | Freq: Once | INTRAMUSCULAR | Status: DC
  Filled 2011-09-11: qty 1

## 2011-09-11 MED ORDER — SODIUM CHLORIDE 0.9 % IV BOLUS (SEPSIS)
1000.0000 mL | Freq: Once | INTRAVENOUS | Status: AC
  Administered 2011-09-11: 1000 mL via INTRAVENOUS

## 2011-09-11 MED ORDER — ONDANSETRON HCL 4 MG PO TABS: 4.0000 mg | ORAL_TABLET | Freq: Four times a day (QID) | ORAL | Status: AC

## 2011-09-11 MED ORDER — POTASSIUM CHLORIDE CRYS ER 20 MEQ PO TBCR
40.0000 meq | EXTENDED_RELEASE_TABLET | Freq: Once | ORAL | Status: AC
  Administered 2011-09-11: 40 meq via ORAL
  Filled 2011-09-11: qty 2

## 2011-09-11 NOTE — Discharge Instructions (Signed)

## 2011-09-11 NOTE — ED Notes (Signed)
Rx x 2, pt voiced understanding to f/u with PCP for worsening condition. 

## 2011-09-11 NOTE — ED Notes (Signed)
Pt c/o diarrhea all day today, states she has had multiple episodes.  States abdominal pain started at 2030 tonight.  Tender in the substernal region with constant nausea, no vomiting.  States she has not eaten today.  Denies chills, urinary frequency, vaginal discharge.

## 2011-09-11 NOTE — ED Notes (Signed)
Pt refuses Dilaudid and Zofran at this time stating she does not feel she needs them right now.

## 2012-05-03 ENCOUNTER — Emergency Department (HOSPITAL_COMMUNITY)
Admission: EM | Admit: 2012-05-03 | Discharge: 2012-05-03 | Disposition: A | Discharge status: Home / Self Care | Payer: Self-pay | Attending: Emergency Medicine | Admitting: Emergency Medicine

## 2012-05-03 ENCOUNTER — Emergency Department (HOSPITAL_COMMUNITY): Payer: Self-pay

## 2012-05-03 DIAGNOSIS — N939 Abnormal uterine and vaginal bleeding, unspecified: Secondary | ICD-10-CM

## 2012-05-03 DIAGNOSIS — Z87891 Personal history of nicotine dependence: Secondary | ICD-10-CM | POA: Insufficient documentation

## 2012-05-03 DIAGNOSIS — Z9851 Tubal ligation status: Secondary | ICD-10-CM | POA: Insufficient documentation

## 2012-05-03 DIAGNOSIS — R55 Syncope and collapse: Secondary | ICD-10-CM | POA: Insufficient documentation

## 2012-05-03 DIAGNOSIS — N938 Other specified abnormal uterine and vaginal bleeding: Secondary | ICD-10-CM | POA: Insufficient documentation

## 2012-05-03 DIAGNOSIS — N949 Unspecified condition associated with female genital organs and menstrual cycle: Secondary | ICD-10-CM | POA: Insufficient documentation

## 2012-05-03 LAB — CBC WITH DIFFERENTIAL/PLATELET
Basophils Absolute: 0 10*3/uL (ref 0.0–0.1)
Basophils Relative: 0 % (ref 0–1)
Eosinophils Absolute: 0 10*3/uL (ref 0.0–0.7)
Lymphs Abs: 1.1 10*3/uL (ref 0.7–4.0)
MCH: 18.7 pg — ABNORMAL LOW (ref 26.0–34.0)
MCHC: 30.1 g/dL (ref 30.0–36.0)
Neutrophils Relative %: 71 % (ref 43–77)
Platelets: 279 10*3/uL (ref 150–400)
RBC: 4.33 MIL/uL (ref 3.87–5.11)
RDW: 18.8 % — ABNORMAL HIGH (ref 11.5–15.5)

## 2012-05-03 LAB — COMPREHENSIVE METABOLIC PANEL
ALT: 10 U/L (ref 0–35)
AST: 18 U/L (ref 0–37)
Albumin: 3.2 g/dL — ABNORMAL LOW (ref 3.5–5.2)
Alkaline Phosphatase: 55 U/L (ref 39–117)
Potassium: 4 mEq/L (ref 3.5–5.1)
Sodium: 142 mEq/L (ref 135–145)
Total Protein: 6.9 g/dL (ref 6.0–8.3)

## 2012-05-03 LAB — WET PREP, GENITAL: Trich, Wet Prep: NONE SEEN

## 2012-05-03 LAB — URINALYSIS, ROUTINE W REFLEX MICROSCOPIC
Bilirubin Urine: NEGATIVE
Hgb urine dipstick: NEGATIVE
Specific Gravity, Urine: 1.026 (ref 1.005–1.030)
pH: 7.5 (ref 5.0–8.0)

## 2012-05-03 LAB — POCT PREGNANCY, URINE: Preg Test, Ur: NEGATIVE

## 2012-05-03 MED ORDER — SODIUM CHLORIDE 0.9 % IV BOLUS (SEPSIS)
1000.0000 mL | Freq: Once | INTRAVENOUS | Status: AC
  Administered 2012-05-03: 1000 mL via INTRAVENOUS

## 2012-05-03 MED ORDER — METRONIDAZOLE 500 MG PO TABS: 500.0000 mg | ORAL_TABLET | Freq: Two times a day (BID) | ORAL | Status: DC

## 2012-05-03 MED ORDER — MEGESTROL ACETATE 40 MG PO TABS
40.0000 mg | ORAL_TABLET | Freq: Every day | ORAL | Status: DC
  Administered 2012-05-03: 40 mg via ORAL
  Filled 2012-05-03: qty 1

## 2012-05-03 MED ORDER — NORGESTIMATE-ETH ESTRADIOL 0.25-35 MG-MCG PO TABS: 2.0000 | ORAL_TABLET | Freq: Every day | ORAL | Status: DC

## 2012-05-03 NOTE — ED Notes (Signed)
Pt states she passed out twice this morning at home.

## 2012-05-03 NOTE — ED Provider Notes (Addendum)
History     CSN: [id number]  Arrival date & time 05/03/12  2952   First MD Initiated Contact with Patient 05/03/12 0740      Chief Complaint  Patient presents with  . Near Syncope     HPI The patient presents after 3 syncopal episodes.  She states that other than a recent increase in vaginal bleeding, attributed to her menstrual cycle, she has been in her usual state of health.  3 times prior to arrival this morning the patient had syncopal episodes.  Each episode was prodromal, with a brief period of lightheadedness.  Each event resulted in the patient awakening on the floor, with no chest pain, no dyspnea, no confusion, no time period consistent with a post ictal phase.  On arrival the patient is without complaints. She does state that chronically she has episodes of palpitations.   No past medical history on file.  Past Surgical History  Procedure Date  . Cholecystectomy   . Tubal ligation     No family history on file.  History  Substance Use Topics  . Smoking status: Former Games developer  . Smokeless tobacco: Not on file  . Alcohol Use: No    OB History    Grav Para Term Preterm Abortions TAB SAB Ect Mult Living                  Review of Systems  Constitutional:       Per HPI, otherwise negative  HENT:       Per HPI, otherwise negative  Eyes: Negative.   Respiratory:       Per HPI, otherwise negative  Cardiovascular:       Per HPI, otherwise negative  Gastrointestinal: Negative for nausea, vomiting and diarrhea.  Genitourinary: Positive for vaginal bleeding. Negative for dysuria, vaginal discharge and vaginal pain.  Musculoskeletal:       Per HPI, otherwise negative  Skin: Negative.   Neurological: Positive for syncope. Negative for seizures, speech difficulty and weakness.    Allergies  Review of patient's allergies indicates no known allergies.  Home Medications  No current outpatient prescriptions on file.  BP 105/67  Pulse 89  Temp 98.4 F (36.9  C) (Oral)  Resp 14  Ht 5\' 4"  (1.626 m)  Wt 184 lb (83.462 kg)  BMI 31.58 kg/m2  SpO2 100%  LMP 04/23/2012  Physical Exam  Nursing note and vitals reviewed. Constitutional: She is oriented to person, place, and time. She appears well-developed and well-nourished. No distress.  HENT:  Head: Normocephalic and atraumatic.  Eyes: Conjunctivae normal and EOM are normal.  Cardiovascular: Normal rate and regular rhythm.   Pulmonary/Chest: Effort normal and breath sounds normal. No stridor. No respiratory distress.  Abdominal: She exhibits no distension.  Musculoskeletal: She exhibits no edema.  Neurological: She is alert and oriented to person, place, and time. No cranial nerve deficit.  Skin: Skin is warm and dry.  Psychiatric: She has a normal mood and affect.    ED Course  Procedures (including critical care time)  Labs Reviewed  CBC WITH DIFFERENTIAL - Abnormal; Notable for the following:    Hemoglobin 8.1 (*)     HCT 26.9 (*)     MCV 62.1 (*)     MCH 18.7 (*)     RDW 18.8 (*)     All other components within normal limits  COMPREHENSIVE METABOLIC PANEL  TROPONIN I  URINALYSIS, ROUTINE W REFLEX MICROSCOPIC   Dg Chest 2 View  05/03/2012  *  RADIOLOGY REPORT*  Clinical Data:  Syncope.  CHEST - 2 VIEW  Comparison: None  Findings: The heart size and mediastinal contours are within normal limits.  Both lungs are clear.  The visualized skeletal structures are unremarkable.  IMPRESSION: No active disease.   Original Report Authenticated By: Irish Lack, M.D.      No diagnosis found.   Date: 05/03/2012  Rate: 80  Rhythm: normal sinus rhythm  QRS Axis: normal  Intervals: normal  ST/T Wave abnormalities: normal  Conduction Disutrbances: none  Narrative Interpretation: unremarkable   O2- 99%ra, normal  10:43 AM W Hgb ~8 - active bleeding, I discussed the case with OB on call.  She advised Megace here and D/C w OCP and Ob F/U.  They will call w info.  VS notable for  persistent mild tachycardia  Update, the patient's vital signs are normal.  She defers transfusion MDM   This previously well 26 year old female presents after a series of syncopal episodes.  The patient's description of a brief prodrome, and no chest pain, no subsequent dyspnea or other focal complaints is reassuring for the low suspicion of acute cardiac pathology.  Given the patient's history of new vaginal bleeding, but does not seem correspondent to her typical menstrual cycle there is a suspicion of dysfunctional uterine bleeding with symptomatic anemia.  I recommended transfusion, but the patient deferred this suggestion.  I discussed her case with our gynecologist on call, and the patient will be started on contraceptives.  She'll receive a call to arrange additional evaluation with her gynecologist in 2 days.  The patient also had e/o BV, which was treated.  Gerhard Munch, MD 05/03/12 1253  Gerhard Munch, MD 05/03/12 1326

## 2012-05-05 LAB — GC/CHLAMYDIA PROBE AMP
CT Probe RNA: NEGATIVE
GC Probe RNA: NEGATIVE

## 2012-05-06 ENCOUNTER — Encounter: Payer: Self-pay | Admitting: Obstetrics & Gynecology

## 2012-05-06 ENCOUNTER — Ambulatory Visit (INDEPENDENT_AMBULATORY_CARE_PROVIDER_SITE_OTHER): Payer: Self-pay | Admitting: Obstetrics & Gynecology

## 2012-05-06 VITALS — BP 123/70 | HR 88 | Temp 97.1°F | Ht 64.0 in | Wt 191.7 lb

## 2012-05-06 DIAGNOSIS — D649 Anemia, unspecified: Secondary | ICD-10-CM | POA: Insufficient documentation

## 2012-05-06 DIAGNOSIS — N920 Excessive and frequent menstruation with regular cycle: Secondary | ICD-10-CM

## 2012-05-06 MED ORDER — INTEGRA F 125-1 MG PO CAPS: 1.0000 | ORAL_CAPSULE | Freq: Every day | ORAL | Status: DC

## 2012-05-06 NOTE — Patient Instructions (Addendum)

## 2012-05-06 NOTE — Progress Notes (Signed)
Patient ID: Breanna Miranda, female   DOB: 1985/06/26, 26 y.o.   MRN: [id number] Pt seen in the ED had 2 cycles back to back.  Was started on OCP's in the ED and the bleeding has abated.  Pt with asymptomatic anemia.  O/ pt int NAD BP 123/70  Pulse 88  Temp 97.1 F (36.2 C)  Ht 5\' 4"  (1.626 m)  Wt 191 lb 11.2 oz (86.955 kg)  BMI 32.91 kg/m2  LMP 04/23/2012  A/P Pt with irregular menses- improved. Keep Sprintec Integra 1 po q day F/u prn  Narcisa Ganesh L. Harraway-Smith, M.D., [place]n Core

## 2012-05-07 ENCOUNTER — Emergency Department (HOSPITAL_COMMUNITY)
Admission: EM | Admit: 2012-05-07 | Discharge: 2012-05-07 | Disposition: A | Discharge status: Home / Self Care | Payer: Self-pay | Attending: Emergency Medicine | Admitting: Emergency Medicine

## 2012-05-07 ENCOUNTER — Encounter (HOSPITAL_COMMUNITY): Payer: Self-pay | Admitting: *Deleted

## 2012-05-07 ENCOUNTER — Other Ambulatory Visit: Payer: Self-pay

## 2012-05-07 DIAGNOSIS — Z8742 Personal history of other diseases of the female genital tract: Secondary | ICD-10-CM | POA: Insufficient documentation

## 2012-05-07 DIAGNOSIS — F430 Acute stress reaction: Secondary | ICD-10-CM | POA: Insufficient documentation

## 2012-05-07 DIAGNOSIS — F411 Generalized anxiety disorder: Secondary | ICD-10-CM | POA: Insufficient documentation

## 2012-05-07 DIAGNOSIS — Z79899 Other long term (current) drug therapy: Secondary | ICD-10-CM | POA: Insufficient documentation

## 2012-05-07 DIAGNOSIS — Z87891 Personal history of nicotine dependence: Secondary | ICD-10-CM | POA: Insufficient documentation

## 2012-05-07 DIAGNOSIS — F419 Anxiety disorder, unspecified: Secondary | ICD-10-CM

## 2012-05-07 DIAGNOSIS — Z1159 Encounter for screening for other viral diseases: Secondary | ICD-10-CM | POA: Insufficient documentation

## 2012-05-07 DIAGNOSIS — Z862 Personal history of diseases of the blood and blood-forming organs and certain disorders involving the immune mechanism: Secondary | ICD-10-CM | POA: Insufficient documentation

## 2012-05-07 LAB — CBC WITH DIFFERENTIAL/PLATELET
Basophils Relative: 0 % (ref 0–1)
Eosinophils Absolute: 0.1 10*3/uL (ref 0.0–0.7)
Eosinophils Relative: 2 % (ref 0–5)
Hemoglobin: 8.7 g/dL — ABNORMAL LOW (ref 12.0–15.0)
Lymphs Abs: 1.7 10*3/uL (ref 0.7–4.0)
MCH: 18.1 pg — ABNORMAL LOW (ref 26.0–34.0)
MCHC: 30 g/dL (ref 30.0–36.0)
MCV: 60.4 fL — ABNORMAL LOW (ref 78.0–100.0)
Monocytes Absolute: 0.5 10*3/uL (ref 0.1–1.0)
Neutrophils Relative %: 53 % (ref 43–77)

## 2012-05-07 LAB — RAPID HIV SCREEN (WH-MAU): Rapid HIV Screen: NONREACTIVE

## 2012-05-07 MED ORDER — LORAZEPAM 1 MG PO TABS
1.0000 mg | ORAL_TABLET | Freq: Once | ORAL | Status: AC
  Administered 2012-05-07: 1 mg via ORAL
  Filled 2012-05-07: qty 1

## 2012-05-07 MED ORDER — LORAZEPAM 1 MG PO TABS: 1.0000 mg | ORAL_TABLET | Freq: Three times a day (TID) | ORAL | Status: DC | PRN

## 2012-05-07 NOTE — ED Notes (Signed)
MD at bedside. 

## 2012-05-07 NOTE — ED Notes (Signed)
Orthostatics reported by [facility]: supine 122/88 pulse 100, standing 124/80 pulse 100

## 2012-05-07 NOTE — ED Notes (Signed)
Pt here per [facility] with c/o anxiety and "feeling heaviness" in her chest.  States she was having the "feeling last night and earlier in the week" where she was seen her for same. Pt advises she drove to work d/t being out of work all week and "just couldn't do it".  [facility] transported here with stable vital signs, Finger stick 137.

## 2012-05-07 NOTE — ED Provider Notes (Signed)
History     CSN: [id number]  Arrival date & time 05/07/12  4098   First MD Initiated Contact with Patient 05/07/12 [phone]      Chief Complaint  Patient presents with  . Anxiety    (Consider location/radiation/quality/duration/timing/severity/associated sxs/prior treatment) HPI Comments:  This is a 26 year old female, who presents to the emergency department with chief complaint of heaviness in her chest. Patient states that she has had a feeling are not for the past week or so. She has been seen for the same in the past. She states that she has no family history of heart disease, sudden cardiac death, denies recent travel. Patient states that she has increased stress in her life due to family situations. She describes her chest heaviness, as a feeling of her airway tightening and being hard to breathe. She denies any tearing, stabbing, radiating chest pain. She says that her discomfort is moderate.  The history is provided by the patient. No language interpreter was used.    Past Medical History  Diagnosis Date  . Menorrhagia   . Anemia     Past Surgical History  Procedure Date  . Cholecystectomy   . Tubal ligation     Family History  Problem Relation Age of Onset  . Diabetes Father     History  Substance Use Topics  . Smoking status: Former Games developer  . Smokeless tobacco: Never Used  . Alcohol Use: 0.6 oz/week    1 Glasses of wine per week     Comment: occaional    OB History    Grav Para Term Preterm Abortions TAB SAB Ect Mult Living   5 4 2 2 1  1   4       Review of Systems  All other systems reviewed and are negative.    Allergies  Shrimp  Home Medications   Current Outpatient Rx  Name  Route  Sig  Dispense  Refill  . INTEGRA F 125-1 MG PO CAPS   Oral   Take 1 capsule by mouth daily.   30 capsule   2   . METRONIDAZOLE 500 MG PO TABS   Oral   Take 1 tablet (500 mg total) by mouth 2 (two) times daily.   14 tablet   0   . NORGESTIMATE-ETH  ESTRADIOL 0.25-35 MG-MCG PO TABS   Oral   Take 2 tablets by mouth daily.   1 Package   11     Take two tabs daily for one week.  Then take one t ...   . LORAZEPAM 1 MG PO TABS   Oral   Take 1 tablet (1 mg total) by mouth 3 (three) times daily as needed for anxiety.   15 tablet   0     BP 103/57  Pulse 86  Temp 98.7 F (37.1 C) (Oral)  Resp 16  Ht 5\' 4"  (1.626 m)  Wt 194 lb (87.998 kg)  BMI 33.30 kg/m2  SpO2 100%  LMP 05/06/2012  Physical Exam  Nursing note and vitals reviewed. Constitutional: She is oriented to person, place, and time. She appears well-developed and well-nourished.  HENT:  Head: Normocephalic and atraumatic.  Eyes: Conjunctivae normal and EOM are normal. Pupils are equal, round, and reactive to light.  Neck: Normal range of motion. Neck supple.  Cardiovascular: Normal rate and regular rhythm.  Exam reveals no gallop and no friction rub.   No murmur heard. Pulmonary/Chest: Effort normal and breath sounds normal. No respiratory distress. She has no  wheezes. She has no rales. She exhibits no tenderness.  Abdominal: Soft. Bowel sounds are normal. She exhibits no distension and no mass. There is no tenderness. There is no rebound and no guarding.  Musculoskeletal: Normal range of motion. She exhibits no edema and no tenderness.  Neurological: She is alert and oriented to person, place, and time.  Skin: Skin is warm and dry.  Psychiatric: She has a normal mood and affect. Her behavior is normal. Judgment and thought content normal.       Patient is anxious and tearful at bedside    ED Course  Procedures (including critical care time)  Labs Reviewed  CBC WITH DIFFERENTIAL - Abnormal; Notable for the following:    Hemoglobin 8.7 (*)     HCT 29.0 (*)     MCV 60.4 (*)     MCH 18.1 (*)     RDW 18.6 (*)     All other components within normal limits  RAPID HIV SCREEN [facility])   Results for orders placed during the hospital encounter of 05/07/12  CBC WITH  DIFFERENTIAL      Component Value Range   WBC 4.9  4.0 - 10.5 K/uL   RBC 4.80  3.87 - 5.11 MIL/uL   Hemoglobin 8.7 (*) 12.0 - 15.0 g/dL   HCT 16.1 (*) 09.6 - 04.5 %   MCV 60.4 (*) 78.0 - 100.0 fL   MCH 18.1 (*) 26.0 - 34.0 pg   MCHC 30.0  30.0 - 36.0 g/dL   RDW 40.9 (*) 81.1 - 91.4 %   Platelets PENDING  150 - 400 K/uL   Neutrophils Relative 53  43 - 77 %   Lymphocytes Relative 35  12 - 46 %   Monocytes Relative 10  3 - 12 %   Eosinophils Relative 2  0 - 5 %   Basophils Relative 0  0 - 1 %   Neutro Abs 2.6  1.7 - 7.7 K/uL   Lymphs Abs 1.7  0.7 - 4.0 K/uL   Monocytes Absolute 0.5  0.1 - 1.0 K/uL   Eosinophils Absolute 0.1  0.0 - 0.7 K/uL   Basophils Absolute 0.0  0.0 - 0.1 K/uL   RBC Morphology ELLIPTOCYTES     Dg Chest 2 View  05/03/2012  *RADIOLOGY REPORT*  Clinical Data:  Syncope.  CHEST - 2 VIEW  Comparison: None  Findings: The heart size and mediastinal contours are within normal limits.  Both lungs are clear.  The visualized skeletal structures are unremarkable.  IMPRESSION: No active disease.   Original Report Authenticated By: Irish Lack, M.D.        1. Anxiety       MDM  25 year old female with anxiety. Will give Ativan and check CBC and rapid HIV per patient's request.  9:28 AM Reevaluated the patient. She states that she is feeling much better after receiving 1 mg of Ativan. She says that her symptoms have resolved completely. I believe the her symptoms are caused by anxiety and stress from family situations. Notify the patient that she will be called if her rapid HIV test is positive. I'm going to send the patient home with some Ativan and recommend followup with primary care provider. I will give the resource list to the patient. Patient is agreeable with this plan. Patient is stable and ready for discharge.  I have discussed the patient with Dr. Rubin Payor.        Roxy Horseman, PA-C 05/07/12 [phone]

## 2012-05-08 NOTE — ED Provider Notes (Signed)
Medical screening examination/treatment/procedure(s) were performed by non-physician practitioner and as supervising physician I was immediately available for consultation/collaboration.  Juliet Rude. Rubin Payor, MD 05/08/12 1056

## 2012-05-13 ENCOUNTER — Encounter (HOSPITAL_COMMUNITY): Payer: Self-pay | Admitting: Emergency Medicine

## 2012-05-13 ENCOUNTER — Emergency Department (HOSPITAL_COMMUNITY)
Admission: EM | Admit: 2012-05-13 | Discharge: 2012-05-13 | Disposition: A | Discharge status: Home / Self Care | Payer: Self-pay | Attending: Emergency Medicine | Admitting: Emergency Medicine

## 2012-05-13 DIAGNOSIS — Z8742 Personal history of other diseases of the female genital tract: Secondary | ICD-10-CM | POA: Insufficient documentation

## 2012-05-13 DIAGNOSIS — Z87891 Personal history of nicotine dependence: Secondary | ICD-10-CM | POA: Insufficient documentation

## 2012-05-13 DIAGNOSIS — R0602 Shortness of breath: Secondary | ICD-10-CM | POA: Insufficient documentation

## 2012-05-13 DIAGNOSIS — M7710 Lateral epicondylitis, unspecified elbow: Secondary | ICD-10-CM | POA: Insufficient documentation

## 2012-05-13 DIAGNOSIS — Z862 Personal history of diseases of the blood and blood-forming organs and certain disorders involving the immune mechanism: Secondary | ICD-10-CM | POA: Insufficient documentation

## 2012-05-13 DIAGNOSIS — M2550 Pain in unspecified joint: Secondary | ICD-10-CM | POA: Insufficient documentation

## 2012-05-13 MED ORDER — NAPROXEN 375 MG PO TABS: 375.0000 mg | ORAL_TABLET | Freq: Two times a day (BID) | ORAL | Status: DC

## 2012-05-13 MED ORDER — OXYCODONE-ACETAMINOPHEN 5-325 MG PO TABS: 2.0000 | ORAL_TABLET | ORAL | Status: DC | PRN

## 2012-05-13 MED ORDER — OXYCODONE-ACETAMINOPHEN 5-325 MG PO TABS
1.0000 | ORAL_TABLET | Freq: Once | ORAL | Status: AC
  Administered 2012-05-13: 1 via ORAL
  Filled 2012-05-13: qty 1

## 2012-05-13 MED ORDER — KETOROLAC TROMETHAMINE 30 MG/ML IJ SOLN
30.0000 mg | Freq: Once | INTRAMUSCULAR | Status: AC
  Administered 2012-05-13: 30 mg via INTRAMUSCULAR
  Filled 2012-05-13: qty 1

## 2012-05-13 NOTE — ED Notes (Addendum)
Pt reports for about 2 days having L arm tingling/pain; pt denies CP, but does report having some SOB; reports was recently here for passing out; pt in NAD; also reports at home her BP was in the 90's; requesting to be checked for blood clots

## 2012-05-13 NOTE — ED Provider Notes (Addendum)
History     CSN: [id number]  Arrival date & time 05/13/12  0137   First MD Initiated Contact with Patient 05/13/12 0139      Chief Complaint  Patient presents with  . Arm Pain  . Shortness of Breath    (Consider location/radiation/quality/duration/timing/severity/associated sxs/prior treatment) Patient is a 26 y.o. female presenting with arm pain and shortness of breath. The history is provided by the patient.  Arm Pain This is a new problem. The current episode started 3 to 5 hours ago. The problem occurs constantly. The problem has not changed since onset.Pertinent negatives include no shortness of breath. The symptoms are aggravated by bending. Nothing relieves the symptoms. She has tried nothing for the symptoms.  Shortness of Breath  Pertinent negatives include no shortness of breath.    Past Medical History  Diagnosis Date  . Menorrhagia   . Anemia     Past Surgical History  Procedure Date  . Cholecystectomy   . Tubal ligation     Family History  Problem Relation Age of Onset  . Diabetes Father     History  Substance Use Topics  . Smoking status: Former Smoker    Quit date: 05/13/2009  . Smokeless tobacco: Never Used  . Alcohol Use: 0.6 oz/week    1 Glasses of wine per week     Comment: occaional    OB History    Grav Para Term Preterm Abortions TAB SAB Ect Mult Living   5 4 2 2 1  1   4       Review of Systems  Respiratory: Negative for shortness of breath.   Musculoskeletal: Positive for arthralgias.  All other systems reviewed and are negative.    Allergies  Shrimp  Home Medications   Current Outpatient Rx  Name  Route  Sig  Dispense  Refill  . INTEGRA F 125-1 MG PO CAPS   Oral   Take 1 capsule by mouth daily.   30 capsule   2   . LORAZEPAM 1 MG PO TABS   Oral   Take 1 tablet (1 mg total) by mouth 3 (three) times daily as needed for anxiety.   15 tablet   0   . METRONIDAZOLE 500 MG PO TABS   Oral   Take 1 tablet (500 mg  total) by mouth 2 (two) times daily.   14 tablet   0   . NORGESTIMATE-ETH ESTRADIOL 0.25-35 MG-MCG PO TABS   Oral   Take 2 tablets by mouth daily.   1 Package   11     Take two tabs daily for one week.  Then take one t ...     BP 118/62  Pulse 86  Temp 97.6 F (36.4 C) (Oral)  Resp 18  SpO2 100%  LMP 05/06/2012  Physical Exam  Constitutional: She is oriented to person, place, and time. She appears well-developed and well-nourished.  HENT:  Head: Normocephalic and atraumatic.  Eyes: Conjunctivae normal and EOM are normal. Pupils are equal, round, and reactive to light.  Neck: Normal range of motion.  Cardiovascular: Normal rate, regular rhythm and normal heart sounds.   Pulmonary/Chest: Effort normal and breath sounds normal.  Abdominal: Soft. Bowel sounds are normal.  Musculoskeletal: Normal range of motion.       Left tennis elbow   Neurological: She is alert and oriented to person, place, and time.  Skin: Skin is warm and dry.  Psychiatric: She has a normal mood and affect. Her behavior  is normal.    ED Course  Procedures (including critical care time)  Labs Reviewed - No data to display No results found.   No diagnosis found.   Date: 05/13/2012  Rate: 81  Rhythm: normal sinus rhythm  QRS Axis: normal  Intervals: normal  ST/T Wave abnormalities: normal  Conduction Disutrbances: none  Narrative Interpretation: unremarkable     MDM  + tennis elbow.  Will ekg.  Analgesia.  Anticipate discharge.  No ocp, no hx of blood clot,  No swelling noted.  + repetivie motion at work.          Rosanne Ashing, MD 05/13/12 0157  Rosanne Ashing, MD 05/13/12 [phone]

## 2012-06-13 ENCOUNTER — Emergency Department (INDEPENDENT_AMBULATORY_CARE_PROVIDER_SITE_OTHER)
Admission: EM | Admit: 2012-06-13 | Discharge: 2012-06-13 | Disposition: A | Discharge status: Home / Self Care | Payer: Self-pay | Source: Home / Self Care | Attending: Emergency Medicine | Admitting: Emergency Medicine

## 2012-06-13 ENCOUNTER — Encounter (HOSPITAL_COMMUNITY): Payer: Self-pay | Admitting: *Deleted

## 2012-06-13 DIAGNOSIS — J069 Acute upper respiratory infection, unspecified: Secondary | ICD-10-CM

## 2012-06-13 MED ORDER — NAPROXEN 500 MG PO TABS: 500.0000 mg | ORAL_TABLET | Freq: Two times a day (BID) | ORAL | Status: DC

## 2012-06-13 MED ORDER — FEXOFENADINE-PSEUDOEPHED ER 60-120 MG PO TB12: 1.0000 | ORAL_TABLET | Freq: Two times a day (BID) | ORAL | Status: DC

## 2012-06-13 MED ORDER — OSELTAMIVIR PHOSPHATE 75 MG PO CAPS: 75.0000 mg | ORAL_CAPSULE | Freq: Two times a day (BID) | ORAL | Status: DC

## 2012-06-13 NOTE — ED Notes (Signed)
Patient complains of head congestion, bilateral ear pain, head aches, body aches, and sore throat x 3 days. Patient denies fever chills, nausea, vomiting, diarrhea.

## 2012-06-14 NOTE — ED Provider Notes (Signed)
Chief Complaint  Patient presents with  . URI    History of Present Illness:   The patient is a 26 year old female who has had a three-day history of sore throat, aching in her ears, headache, nasal congestion with clear rhinorrhea, abdominal pain, generalized aching, chills, and has felt feverish. She has been exposed to influenza. She has tried ibuprofen, Tylenol, and NyQuil for symptomatic relief.  Review of Systems:  Other than noted above, the patient denies any of the following symptoms. Systemic:  No fever, chills, sweats, fatigue, myalgias, headache, or anorexia. Eye:  No redness, pain or drainage. ENT:  No earache, ear congestion, nasal congestion, sneezing, rhinorrhea, sinus pressure, sinus pain, post nasal drip, or sore throat. Lungs:  No cough, sputum production, wheezing, shortness of breath, or chest pain. GI:  No abdominal pain, nausea, vomiting, or diarrhea.  PMFSH:  Past medical history, family history, social history, meds, and allergies were reviewed.  Physical Exam:   Vital signs:  BP 115/70  Pulse 82  Temp 98.4 F (36.9 C) (Oral)  Resp 16  SpO2 100% General:  Alert, in no distress. Eye:  No conjunctival injection or drainage. Lids were normal. ENT:  TMs and canals were normal, without erythema or inflammation.  Nasal mucosa was clear and uncongested, without drainage.  Mucous membranes were moist.  Pharynx was clear, without exudate or drainage.  There were no oral ulcerations or lesions. Neck:  Supple, no adenopathy, tenderness or mass. Lungs:  No respiratory distress.  Lungs were clear to auscultation, without wheezes, rales or rhonchi.  Breath sounds were clear and equal bilaterally.  Heart:  Regular rhythm, without gallops, murmers or rubs. Skin:  Clear, warm, and dry, without rash or lesions.  Labs:   Results for orders placed during the hospital encounter of 06/13/12  POCT RAPID STREP A ([facility] ONLY)      Component Value Range   Streptococcus, Group  A Screen (Direct) NEGATIVE  NEGATIVE   Assessment:  The encounter diagnosis was Viral upper respiratory infection.  Plan:   1.  The following meds were prescribed:   New Prescriptions   FEXOFENADINE-PSEUDOEPHEDRINE (ALLEGRA-D) 60-120 MG PER TABLET    Take 1 tablet by mouth every 12 (twelve) hours.   NAPROXEN (NAPROSYN) 500 MG TABLET    Take 1 tablet (500 mg total) by mouth 2 (two) times daily.   OSELTAMIVIR (TAMIFLU) 75 MG CAPSULE    Take 1 capsule (75 mg total) by mouth every 12 (twelve) hours.   2.  The patient was instructed in symptomatic care and handouts were given. 3.  The patient was told to return if becoming worse in any way, if no better in 3 or 4 days, and given some red flag symptoms that would indicate earlier return.   Reuben Likes, MD 06/14/12 [phone]

## 2012-08-30 ENCOUNTER — Emergency Department (HOSPITAL_COMMUNITY)
Admission: EM | Admit: 2012-08-30 | Discharge: 2012-08-30 | Disposition: A | Discharge status: Home / Self Care | Payer: Self-pay | Attending: Emergency Medicine | Admitting: Emergency Medicine

## 2012-08-30 ENCOUNTER — Encounter (HOSPITAL_COMMUNITY): Payer: Self-pay | Admitting: *Deleted

## 2012-08-30 DIAGNOSIS — Z9089 Acquired absence of other organs: Secondary | ICD-10-CM | POA: Insufficient documentation

## 2012-08-30 DIAGNOSIS — Z8742 Personal history of other diseases of the female genital tract: Secondary | ICD-10-CM | POA: Insufficient documentation

## 2012-08-30 DIAGNOSIS — R1110 Vomiting, unspecified: Secondary | ICD-10-CM | POA: Insufficient documentation

## 2012-08-30 DIAGNOSIS — K529 Noninfective gastroenteritis and colitis, unspecified: Secondary | ICD-10-CM

## 2012-08-30 DIAGNOSIS — Z87891 Personal history of nicotine dependence: Secondary | ICD-10-CM | POA: Insufficient documentation

## 2012-08-30 DIAGNOSIS — Z862 Personal history of diseases of the blood and blood-forming organs and certain disorders involving the immune mechanism: Secondary | ICD-10-CM | POA: Insufficient documentation

## 2012-08-30 DIAGNOSIS — F411 Generalized anxiety disorder: Secondary | ICD-10-CM | POA: Insufficient documentation

## 2012-08-30 DIAGNOSIS — Z9851 Tubal ligation status: Secondary | ICD-10-CM | POA: Insufficient documentation

## 2012-08-30 DIAGNOSIS — K5289 Other specified noninfective gastroenteritis and colitis: Secondary | ICD-10-CM | POA: Insufficient documentation

## 2012-08-30 DIAGNOSIS — Z79899 Other long term (current) drug therapy: Secondary | ICD-10-CM | POA: Insufficient documentation

## 2012-08-30 HISTORY — DX: Anxiety disorder, unspecified: F41.9

## 2012-08-30 NOTE — ED Provider Notes (Signed)
History     CSN: [id number]  Arrival date & time 08/30/12  0800   First MD Initiated Contact with Patient 08/30/12 0820      Chief Complaint  Patient presents with  . Abdominal Cramping  . Diarrhea  . Emesis    (Consider location/radiation/quality/duration/timing/severity/associated sxs/prior treatment) Patient is a 27 y.o. female presenting with diarrhea and vomiting. The history is provided by the patient.  Diarrhea Quality:  Unable to specify Severity:  Mild Onset quality:  Gradual Number of episodes:  8 Duration:  2 days Timing:  Intermittent Progression:  Improving Relieved by:  None tried Worsened by:  Nothing tried Ineffective treatments:  None tried Associated symptoms: vomiting   Associated symptoms: no abdominal pain (cramping ), no arthralgias, no chills, no recent cough, no diaphoresis, no fever, no headaches, no myalgias and no URI   Risk factors: sick contacts   Risk factors: no recent antibiotic use, no suspicious food intake and no travel to endemic areas   Emesis Severity:  Mild Duration:  1 day Timing:  Intermittent Number of daily episodes:  4 Quality:  Unable to specify Able to tolerate:  Liquids and solids Progression:  Resolved Chronicity:  New Associated symptoms: diarrhea   Associated symptoms: no abdominal pain (cramping ), no arthralgias, no chills, no cough, no headaches, no myalgias and no URI     Past Medical History  Diagnosis Date  . Menorrhagia   . Anemia   . Anxiety     Past Surgical History  Procedure Laterality Date  . Cholecystectomy    . Tubal ligation      Family History  Problem Relation Age of Onset  . Diabetes Father     History  Substance Use Topics  . Smoking status: Former Smoker    Quit date: 05/13/2009  . Smokeless tobacco: Never Used  . Alcohol Use: 0.6 oz/week    1 Glasses of wine per week     Comment: occaional    OB History   Grav Para Term Preterm Abortions TAB SAB Ect Mult Living   5 4 2 2 1   1   4       Review of Systems  Constitutional: Negative for fever, chills, diaphoresis and activity change.  HENT: Negative for congestion and neck pain.   Respiratory: Negative for cough.   Gastrointestinal: Positive for vomiting and diarrhea. Negative for abdominal pain (cramping ).  Genitourinary: Negative for dysuria.  Musculoskeletal: Negative for myalgias and arthralgias.  Skin: Negative for color change and wound.  Neurological: Negative for headaches.  All other systems reviewed and are negative.    Allergies  Shrimp  Home Medications   Current Outpatient Rx  Name  Route  Sig  Dispense  Refill  . LORazepam (ATIVAN) 1 MG tablet   Oral   Take 1 tablet (1 mg total) by mouth 3 (three) times daily as needed for anxiety.   15 tablet   0     BP 103/67  Pulse 90  Temp(Src) 97.7 F (36.5 C)  Resp 18  SpO2 98%  LMP 08/07/2012  Physical Exam  Nursing note and vitals reviewed. Constitutional: Vital signs are normal. She appears well-developed and well-nourished. No distress.  HENT:  Head: Normocephalic and atraumatic.  Mouth/Throat: Uvula is midline, oropharynx is clear and moist and mucous membranes are normal.  Eyes: Conjunctivae and EOM are normal. Pupils are equal, round, and reactive to light.  Neck: Normal range of motion and full passive range of motion without  pain. Neck supple. No spinous process tenderness and no muscular tenderness present. No rigidity. No Brudzinski's sign noted.  Cardiovascular: Normal rate and regular rhythm.   Pulmonary/Chest: Effort normal and breath sounds normal. No accessory muscle usage. Not tachypneic. No respiratory distress.  Abdominal: Soft. Normal appearance. She exhibits no distension, no ascites, no pulsatile midline mass and no mass. There is no tenderness. There is no CVA tenderness. No hernia.  Lymphadenopathy:    She has no cervical adenopathy.  Neurological: She is alert.  Skin: Skin is warm and dry. No rash noted. She  is not diaphoretic.  Psychiatric: She has a normal mood and affect. Her speech is normal and behavior is normal.    ED Course  Procedures (including critical care time)  Labs Reviewed - No data to display No results found.   No diagnosis found.   BP 103/67  Pulse 90  Temp(Src) 97.7 F (36.5 C)  Resp 18  SpO2 98%  LMP 08/07/2012  MDM  Gastroenteritis, improved  Patient with symptoms consistent with viral gastroenteritis.  Vitals are stable, no fever.  No signs of dehydration, tolerating PO fluids > 6 oz.  Lungs are clear.  No focal abdominal pain, no concern for appendicitis, cholecystitis, pancreatitis, ruptured viscus, UTI, kidney stone, or any other abdominal etiology.  Supportive therapy indicated with return if symptoms worsen.  Patient counseled.         Jaci Carrel, [place] 08/30/12 [phone]

## 2012-08-30 NOTE — ED Provider Notes (Signed)
Medical screening examination/treatment/procedure(s) were performed by non-physician practitioner and as supervising physician I was immediately available for consultation/collaboration.   Gavin Pound. Oletta Lamas, MD 08/30/12 [phone]

## 2012-08-30 NOTE — ED Notes (Signed)
Pt reports that she started vomiting and having diarrhea on Friday.  She also has abdominal cramping that is worse prior to BM and better after.  Last emesis was yesterday and pt has been drinking water.  Family members in the home with the same symptoms.  She repots that she is a lot better today.

## 2013-06-03 ENCOUNTER — Emergency Department (HOSPITAL_COMMUNITY)
Admission: EM | Admit: 2013-06-03 | Discharge: 2013-06-03 | Disposition: A | Discharge status: Home / Self Care | Payer: Self-pay | Attending: Emergency Medicine | Admitting: Emergency Medicine

## 2013-06-03 ENCOUNTER — Other Ambulatory Visit: Payer: Self-pay

## 2013-06-03 ENCOUNTER — Encounter (HOSPITAL_COMMUNITY): Payer: Self-pay | Admitting: Emergency Medicine

## 2013-06-03 ENCOUNTER — Emergency Department (HOSPITAL_COMMUNITY): Payer: Self-pay

## 2013-06-03 DIAGNOSIS — Z3202 Encounter for pregnancy test, result negative: Secondary | ICD-10-CM | POA: Insufficient documentation

## 2013-06-03 DIAGNOSIS — Z8659 Personal history of other mental and behavioral disorders: Secondary | ICD-10-CM | POA: Insufficient documentation

## 2013-06-03 DIAGNOSIS — Z87891 Personal history of nicotine dependence: Secondary | ICD-10-CM | POA: Insufficient documentation

## 2013-06-03 DIAGNOSIS — Z8742 Personal history of other diseases of the female genital tract: Secondary | ICD-10-CM | POA: Insufficient documentation

## 2013-06-03 DIAGNOSIS — Z862 Personal history of diseases of the blood and blood-forming organs and certain disorders involving the immune mechanism: Secondary | ICD-10-CM | POA: Insufficient documentation

## 2013-06-03 DIAGNOSIS — K219 Gastro-esophageal reflux disease without esophagitis: Secondary | ICD-10-CM | POA: Insufficient documentation

## 2013-06-03 LAB — CBC
HCT: 25 % — ABNORMAL LOW (ref 36.0–46.0)
MCHC: 30.4 g/dL (ref 30.0–36.0)
RDW: 18.3 % — ABNORMAL HIGH (ref 11.5–15.5)
WBC: 6.2 10*3/uL (ref 4.0–10.5)

## 2013-06-03 LAB — BASIC METABOLIC PANEL
BUN: 13 mg/dL (ref 6–23)
Chloride: 107 mEq/L (ref 96–112)
GFR calc Af Amer: >90 mL/min (ref >90)
GFR calc non Af Amer: >90 mL/min (ref >90)
Potassium: 3.3 mEq/L — ABNORMAL LOW (ref 3.5–5.1)
Sodium: 137 mEq/L (ref 135–145)

## 2013-06-03 LAB — POCT PREGNANCY, URINE: Preg Test, Ur: NEGATIVE

## 2013-06-03 LAB — POCT I-STAT TROPONIN I: Troponin i, poc: 0 ng/mL (ref 0.00–0.08)

## 2013-06-03 MED ORDER — ESOMEPRAZOLE MAGNESIUM 40 MG PO CPDR: 40.0000 mg | DELAYED_RELEASE_CAPSULE | Freq: Every day | ORAL | Status: DC

## 2013-06-03 MED ORDER — GI COCKTAIL ~~LOC~~
30.0000 mL | Freq: Once | ORAL | Status: AC
  Administered 2013-06-03: 30 mL via ORAL
  Filled 2013-06-03: qty 30

## 2013-06-03 MED ORDER — FERROUS SULFATE 325 (65 FE) MG PO TABS: 325.0000 mg | ORAL_TABLET | Freq: Three times a day (TID) | ORAL | Status: DC

## 2013-06-03 NOTE — ED Provider Notes (Signed)
CSN: [id number]     Arrival date & time 06/03/13  0015 History   First MD Initiated Contact with Patient 06/03/13 0142     Chief Complaint  Patient presents with  . Chest Pain   (Consider location/radiation/quality/duration/timing/severity/associated sxs/prior Treatment) Patient is a 27 y.o. female presenting with abdominal pain. The history is provided by the patient. A language interpreter was used.  Abdominal Pain Pain location:  Epigastric Pain quality: burning   Pain radiates to:  Does not radiate Pain severity:  Moderate Onset quality:  Gradual Timing:  Constant Progression:  Unchanged Chronicity:  Recurrent Context: not diet changes, not recent illness and not sick contacts   Relieved by:  Nothing Worsened by:  NSAIDs Ineffective treatments:  NSAIDs Associated symptoms: no anorexia, no shortness of breath and no vomiting   Risk factors: not pregnant   Feels like it is her GERD as she is off her meds  Past Medical History  Diagnosis Date  . Menorrhagia   . Anemia   . Anxiety    Past Surgical History  Procedure Laterality Date  . Cholecystectomy    . Tubal ligation     Family History  Problem Relation Age of Onset  . Diabetes Father    History  Substance Use Topics  . Smoking status: Former Smoker    Quit date: 05/13/2009  . Smokeless tobacco: Never Used  . Alcohol Use: 0.6 oz/week    1 Glasses of wine per week     Comment: occaional   OB History   Grav Para Term Preterm Abortions TAB SAB Ect Mult Living   5 4 2 2 1  1   4      Review of Systems  Respiratory: Negative for shortness of breath.   Cardiovascular: Negative for palpitations and leg swelling.  Gastrointestinal: Positive for abdominal pain. Negative for vomiting and anorexia.  All other systems reviewed and are negative.    Allergies  Shrimp  Home Medications   Current Outpatient Rx  Name  Route  Sig  Dispense  Refill  . ibuprofen (ADVIL,MOTRIN) 200 MG tablet   Oral   Take 800 mg  by mouth every 6 (six) hours as needed for mild pain or moderate pain.          BP 100/57  Pulse 75  Temp(Src) 98 F (36.7 C) (Oral)  Resp 14  SpO2 99%  LMP 05/17/2013 Physical Exam  Constitutional: She is oriented to person, place, and time. She appears well-developed and well-nourished. No distress.  HENT:  Head: Normocephalic and atraumatic.  Mouth/Throat: Oropharynx is clear and moist.  Eyes: Conjunctivae are normal. Pupils are equal, round, and reactive to light.  Neck: Normal range of motion. Neck supple.  Cardiovascular: Normal rate, regular rhythm and intact distal pulses.   Pulmonary/Chest: Effort normal and breath sounds normal. She has no wheezes. She has no rales.  Abdominal: Soft. Bowel sounds are normal. There is no tenderness. There is no rebound and no guarding.  Musculoskeletal: Normal range of motion.  Neurological: She is alert and oriented to person, place, and time.  Skin: Skin is warm and dry.  Psychiatric: She has a normal mood and affect.    ED Course  Procedures (including critical care time) Labs Review Labs Reviewed  CBC - Abnormal; Notable for the following:    Hemoglobin 7.6 (*)    HCT 25.0 (*)    MCV 58.7 (*)    MCH 17.8 (*)    RDW 18.3 (*)  All other components within normal limits  BASIC METABOLIC PANEL - Abnormal; Notable for the following:    Potassium 3.3 (*)    Glucose, Bld 124 (*)    Calcium 8.0 (*)    All other components within normal limits  POCT I-STAT TROPONIN I   Imaging Review Dg Chest 2 View  06/03/2013   CLINICAL DATA:  Mid chest pain for 5 hr.  EXAM: CHEST  2 VIEW  COMPARISON:  05/03/2012  FINDINGS: The heart is upper limits normal in size. There are no focal consolidations or pleural effusions. No pulmonary edema. Surgical clips are identified in the right upper quadrant of the abdomen. Visualized osseous structures have a normal appearance. Zero  IMPRESSION: No active cardiopulmonary disease.   Electronically Signed    By: Rosalie Gums M.D.   On: 06/03/2013 01:32    EKG Interpretation   None       MDM  No diagnosis found. PERC negative and wells 0  Symptoms consistent with GERD, syncope vagal in response to pain.  Will treat for anemia from menorrhagia  Date: 06/03/2013  Rate: 80  Rhythm: normal sinus rhythm  QRS Axis: normal  Intervals: normal  ST/T Wave abnormalities: normal  Conduction Disutrbances: none  Narrative Interpretation: unremarkable      Khamryn Calderone K Sharise Lippy-Rasch, MD 06/03/13 [phone]

## 2013-06-03 NOTE — ED Notes (Signed)
Patient transported to X-ray 

## 2013-06-03 NOTE — ED Notes (Signed)
preg test is negative

## 2013-06-03 NOTE — ED Notes (Signed)
POC preg test Negative  

## 2013-06-03 NOTE — ED Notes (Signed)
According to the patient she started having chest pain about 2000, and she took ibuprofen.  Later about 2300, she fainted.  She tried to walk to her son and she fainted again, her son witnessed the episode so he called EMS.  Upon their arrival they gave her 1 SL Nitro and it 10/10 to about 8/10.  The  Patient was transported to [facility].

## 2013-06-22 ENCOUNTER — Encounter (HOSPITAL_COMMUNITY): Payer: Self-pay | Admitting: Emergency Medicine

## 2013-06-22 ENCOUNTER — Emergency Department (HOSPITAL_COMMUNITY)
Admission: EM | Admit: 2013-06-22 | Discharge: 2013-06-22 | Disposition: A | Discharge status: Home / Self Care | Payer: Self-pay | Attending: Emergency Medicine | Admitting: Emergency Medicine

## 2013-06-22 DIAGNOSIS — F411 Generalized anxiety disorder: Secondary | ICD-10-CM | POA: Insufficient documentation

## 2013-06-22 DIAGNOSIS — Z79899 Other long term (current) drug therapy: Secondary | ICD-10-CM | POA: Insufficient documentation

## 2013-06-22 DIAGNOSIS — R110 Nausea: Secondary | ICD-10-CM | POA: Insufficient documentation

## 2013-06-22 DIAGNOSIS — Z862 Personal history of diseases of the blood and blood-forming organs and certain disorders involving the immune mechanism: Secondary | ICD-10-CM | POA: Insufficient documentation

## 2013-06-22 DIAGNOSIS — R1110 Vomiting, unspecified: Secondary | ICD-10-CM | POA: Insufficient documentation

## 2013-06-22 DIAGNOSIS — Z3202 Encounter for pregnancy test, result negative: Secondary | ICD-10-CM | POA: Insufficient documentation

## 2013-06-22 DIAGNOSIS — Z8742 Personal history of other diseases of the female genital tract: Secondary | ICD-10-CM | POA: Insufficient documentation

## 2013-06-22 DIAGNOSIS — R51 Headache: Secondary | ICD-10-CM | POA: Insufficient documentation

## 2013-06-22 DIAGNOSIS — R002 Palpitations: Secondary | ICD-10-CM | POA: Insufficient documentation

## 2013-06-22 DIAGNOSIS — K089 Disorder of teeth and supporting structures, unspecified: Secondary | ICD-10-CM | POA: Insufficient documentation

## 2013-06-22 DIAGNOSIS — Z9089 Acquired absence of other organs: Secondary | ICD-10-CM | POA: Insufficient documentation

## 2013-06-22 DIAGNOSIS — Z87891 Personal history of nicotine dependence: Secondary | ICD-10-CM | POA: Insufficient documentation

## 2013-06-22 DIAGNOSIS — K0889 Other specified disorders of teeth and supporting structures: Secondary | ICD-10-CM

## 2013-06-22 DIAGNOSIS — R5381 Other malaise: Secondary | ICD-10-CM | POA: Insufficient documentation

## 2013-06-22 LAB — CBC WITH DIFFERENTIAL/PLATELET
Basophils Absolute: 0 10*3/uL (ref 0.0–0.1)
Basophils Relative: 1 % (ref 0–1)
Eosinophils Absolute: 0 10*3/uL (ref 0.0–0.7)
Eosinophils Relative: 1 % (ref 0–5)
HCT: 28.1 % — ABNORMAL LOW (ref 36.0–46.0)
Hemoglobin: 8.5 g/dL — ABNORMAL LOW (ref 12.0–15.0)
Lymphocytes Relative: 47 % — ABNORMAL HIGH (ref 12–46)
Lymphs Abs: 1.8 10*3/uL (ref 0.7–4.0)
MCH: 17.9 pg — ABNORMAL LOW (ref 26.0–34.0)
MCHC: 30.2 g/dL (ref 30.0–36.0)
MCV: 59 fL — ABNORMAL LOW (ref 78.0–100.0)
Monocytes Absolute: 0.3 10*3/uL (ref 0.1–1.0)
Monocytes Relative: 8 % (ref 3–12)
Neutro Abs: 1.5 10*3/uL — ABNORMAL LOW (ref 1.7–7.7)
Neutrophils Relative %: 43 % (ref 43–77)
Platelets: 289 10*3/uL (ref 150–400)
RBC: 4.76 MIL/uL (ref 3.87–5.11)
RDW: 20.2 % — ABNORMAL HIGH (ref 11.5–15.5)
WBC: 3.6 10*3/uL — ABNORMAL LOW (ref 4.0–10.5)

## 2013-06-22 LAB — URINALYSIS, ROUTINE W REFLEX MICROSCOPIC
Bilirubin Urine: NEGATIVE
Glucose, UA: NEGATIVE mg/dL
Hgb urine dipstick: NEGATIVE
Ketones, ur: NEGATIVE mg/dL
Nitrite: NEGATIVE
Protein, ur: NEGATIVE mg/dL
Specific Gravity, Urine: 1.01 (ref 1.005–1.030)
Urobilinogen, UA: 0.2 mg/dL (ref 0.0–1.0)
pH: 7 (ref 5.0–8.0)

## 2013-06-22 LAB — COMPREHENSIVE METABOLIC PANEL
ALT: 11 U/L (ref 0–35)
AST: 20 U/L (ref 0–37)
Albumin: 3.7 g/dL (ref 3.5–5.2)
Alkaline Phosphatase: 61 U/L (ref 39–117)
BUN: 6 mg/dL (ref 6–23)
CO2: 27 mEq/L (ref 19–32)
Calcium: 9.1 mg/dL (ref 8.4–10.5)
Chloride: 102 mEq/L (ref 96–112)
Creatinine, Ser: 0.67 mg/dL (ref 0.50–1.10)
GFR calc Af Amer: >90 mL/min (ref >90)
GFR calc non Af Amer: >90 mL/min (ref >90)
Glucose, Bld: 90 mg/dL (ref 70–99)
Potassium: 3.8 mEq/L (ref 3.7–5.3)
Sodium: 140 mEq/L (ref 137–147)
Total Bilirubin: 0.4 mg/dL (ref 0.3–1.2)
Total Protein: 7.6 g/dL (ref 6.0–8.3)

## 2013-06-22 LAB — URINE MICROSCOPIC-ADD ON

## 2013-06-22 LAB — LIPASE, BLOOD: Lipase: 48 U/L (ref 11–59)

## 2013-06-22 LAB — POCT I-STAT TROPONIN I

## 2013-06-22 MED ORDER — PENICILLIN V POTASSIUM 500 MG PO TABS: 500.0000 mg | ORAL_TABLET | Freq: Four times a day (QID) | ORAL | Status: AC

## 2013-06-22 MED ORDER — OXYCODONE-ACETAMINOPHEN 5-325 MG PO TABS: 1.0000 | ORAL_TABLET | ORAL | Status: DC | PRN

## 2013-06-22 MED ORDER — IBUPROFEN 600 MG PO TABS: 600.0000 mg | ORAL_TABLET | Freq: Four times a day (QID) | ORAL | Status: DC | PRN

## 2013-06-22 NOTE — ED Notes (Signed)
Pt is here with vomiting, toothache, ear pain, headache and heart fluttering. Pt has pain to left upper abdominal area

## 2013-06-27 NOTE — ED Provider Notes (Signed)
CSN: [id number]     Arrival date & time 06/22/13  0910 History   First MD Initiated Contact with Patient 06/22/13 1143     Chief Complaint  Patient presents with  . Emesis  . Dental Pain  . Headache  . Irregular Heart Beat   (Consider location/radiation/quality/duration/timing/severity/associated sxs/prior Treatment) HPI 28 year old female multiple complaints.  Complaint is right-sided facial pain. Relates this to a cracked tooth. Right lower molar. Denies trauma. Pain is constant. Onset about a week ago. Worse with the last 2-3 days. Worse with eating anything this area. No drainage. No fevers or chills. No difficulty breathing or swallowing. She is having sharp radiating pain to her right ear. Additionally she is complaining of multiple other things palpitations, upper, pain, fatigue, nausea.   Past Medical History  Diagnosis Date  . Menorrhagia   . Anemia   . Anxiety    Past Surgical History  Procedure Laterality Date  . Cholecystectomy    . Tubal ligation     Family History  Problem Relation Age of Onset  . Diabetes Father    History  Substance Use Topics  . Smoking status: Former Smoker    Quit date: 05/13/2009  . Smokeless tobacco: Never Used  . Alcohol Use: 0.6 oz/week    1 Glasses of wine per week     Comment: occaional   OB History   Grav Para Term Preterm Abortions TAB SAB Ect Mult Living   5 4 2 2 1  1   4      Review of Systems  All systems reviewed and negative, other than as noted in HPI.   Allergies  Shrimp  Home Medications   Current Outpatient Rx  Name  Route  Sig  Dispense  Refill  . acetaminophen (TYLENOL) 500 MG tablet   Oral   Take 1,000 mg by mouth every 6 (six) hours as needed for mild pain.         . Aspirin-Acetaminophen-Caffeine (GOODY HEADACHE PO)   Oral   Take 1 packet by mouth daily as needed (for pain).         Marland Kitchen ibuprofen (ADVIL,MOTRIN) 200 MG tablet   Oral   Take 800 mg by mouth every 8 (eight) hours as needed for  moderate pain.         Marland Kitchen LORazepam (ATIVAN) 1 MG tablet   Oral   Take 1 mg by mouth every 8 (eight) hours as needed for anxiety.         . Prenatal Vit-Fe Fumarate-FA (PRENATAL MULTIVITAMIN) TABS tablet   Oral   Take 1 tablet by mouth daily at 12 noon.         Marland Kitchen RANITIDINE HCL PO   Oral   Take 1 tablet by mouth 2 (two) times daily.         Marland Kitchen ibuprofen (ADVIL,MOTRIN) 600 MG tablet   Oral   Take 1 tablet (600 mg total) by mouth every 6 (six) hours as needed.   30 tablet   0   . oxyCODONE-acetaminophen (PERCOCET/ROXICET) 5-325 MG per tablet   Oral   Take 1-2 tablets by mouth every 4 (four) hours as needed for severe pain.   20 tablet   0   . penicillin v potassium (VEETID) 500 MG tablet   Oral   Take 1 tablet (500 mg total) by mouth 4 (four) times daily.   28 tablet   0    BP 103/55  Pulse 72  Temp(Src) 98.2 F (36.8  C) (Oral)  Resp 20  Wt 194 lb (87.998 kg)  SpO2 100%  LMP 06/10/2013 Physical Exam  Nursing note and vitals reviewed. Constitutional: She appears well-developed and well-nourished. No distress.  HENT:  Head: Normocephalic and atraumatic.  Cracked right lower molar. Stranding gingivitis. No drainable collection. Submental tissues are soft. Posterior pharynx is clear. Uvula is midline. Handling secretions. Normal stomach phonation. Pain in her ear is likely referred as her external auditory canals and tympanic membranes are grossly normal in appearance. No nodes. Neck is supple.  Eyes: Conjunctivae are normal. Right eye exhibits no discharge. Left eye exhibits no discharge.  Neck: Neck supple.  Cardiovascular: Normal rate, regular rhythm and normal heart sounds.  Exam reveals no gallop and no friction rub.   No murmur heard. Pulmonary/Chest: Effort normal and breath sounds normal. No respiratory distress.  Abdominal: Soft. She exhibits no distension. There is no tenderness.  Musculoskeletal: She exhibits no edema and no tenderness.  Lower  extremities symmetric as compared to each other. No calf tenderness. Negative Homan's. No palpable cords.   Neurological: She is alert.  Skin: Skin is warm and dry.  Psychiatric: She has a normal mood and affect. Her behavior is normal. Thought content normal.    ED Course  Procedures (including critical care time) Labs Review Labs Reviewed  CBC WITH DIFFERENTIAL - Abnormal; Notable for the following:    WBC 3.6 (*)    Hemoglobin 8.5 (*)    HCT 28.1 (*)    MCV 59.0 (*)    MCH 17.9 (*)    RDW 20.2 (*)    Lymphocytes Relative 47 (*)    Neutro Abs 1.5 (*)    All other components within normal limits  URINALYSIS, ROUTINE W REFLEX MICROSCOPIC - Abnormal; Notable for the following:    APPearance CLOUDY (*)    Leukocytes, UA TRACE (*)    All other components within normal limits  URINE MICROSCOPIC-ADD ON - Abnormal; Notable for the following:    Squamous Epithelial / LPF MANY (*)    All other components within normal limits  COMPREHENSIVE METABOLIC PANEL  LIPASE, BLOOD  POCT I-STAT TROPONIN I  POCT PREGNANCY, URINE   Imaging Review No results found.  EKG Interpretation    Date/Time:  Tuesday June 22 2013 09:35:38 EST Ventricular Rate:  84 PR Interval:  168 QRS Duration: 66 QT Interval:  360 QTC Calculation: 425 R Axis:   63 Text Interpretation:  Normal sinus rhythm Normal ECG ED PHYSICIAN INTERPRETATION AVAILABLE IN [facility] Confirmed by TEST, RECORD ([id number]) on 06/24/2013 11:34:31 AM            MDM   1. Pain, dental   2. Heart palpitations    28 year old female with dental pain. Offered dental block, but patient is declining. Additionally, palpitation. Hemodynamically stable. EKG is not particularly concerning. She is anemic, but this is chronic and not simply change from recent labs. Symptoms persist she may benefit from Holter monitor. Return cautions were discussed. Outpatient followup otherwise.    Virgel Manifold, MD 06/27/13 [phone]

## 2013-07-17 ENCOUNTER — Emergency Department (HOSPITAL_COMMUNITY)
Admission: EM | Admit: 2013-07-17 | Discharge: 2013-07-17 | Disposition: A | Discharge status: Home / Self Care | Payer: Self-pay | Attending: Emergency Medicine | Admitting: Emergency Medicine

## 2013-07-17 ENCOUNTER — Encounter (HOSPITAL_COMMUNITY): Payer: Self-pay | Admitting: Emergency Medicine

## 2013-07-17 ENCOUNTER — Emergency Department (HOSPITAL_COMMUNITY): Payer: Self-pay

## 2013-07-17 DIAGNOSIS — Z87891 Personal history of nicotine dependence: Secondary | ICD-10-CM | POA: Insufficient documentation

## 2013-07-17 DIAGNOSIS — Z8742 Personal history of other diseases of the female genital tract: Secondary | ICD-10-CM | POA: Insufficient documentation

## 2013-07-17 DIAGNOSIS — Z79899 Other long term (current) drug therapy: Secondary | ICD-10-CM | POA: Insufficient documentation

## 2013-07-17 DIAGNOSIS — R569 Unspecified convulsions: Secondary | ICD-10-CM | POA: Insufficient documentation

## 2013-07-17 DIAGNOSIS — D649 Anemia, unspecified: Secondary | ICD-10-CM | POA: Insufficient documentation

## 2013-07-17 DIAGNOSIS — Z3202 Encounter for pregnancy test, result negative: Secondary | ICD-10-CM | POA: Insufficient documentation

## 2013-07-17 DIAGNOSIS — F411 Generalized anxiety disorder: Secondary | ICD-10-CM | POA: Insufficient documentation

## 2013-07-17 LAB — POCT I-STAT, CHEM 8
BUN: 7 mg/dL (ref 6–23)
CREATININE: 0.7 mg/dL (ref 0.50–1.10)
Calcium, Ion: 1.21 mmol/L (ref 1.12–1.23)
Chloride: 105 mEq/L (ref 96–112)
GLUCOSE: 113 mg/dL — AB (ref 70–99)
HEMATOCRIT: 27 % — AB (ref 36.0–46.0)
HEMOGLOBIN: 9.2 g/dL — AB (ref 12.0–15.0)
Potassium: 3.6 mEq/L — ABNORMAL LOW (ref 3.7–5.3)
Sodium: 138 mEq/L (ref 137–147)
TCO2: 25 mmol/L (ref 0–100)

## 2013-07-17 LAB — POCT PREGNANCY, URINE: PREG TEST UR: NEGATIVE

## 2013-07-17 NOTE — ED Notes (Signed)
Pt reports two witnessed seizures this morning by her husband that occurred at 2am and 5am.  Pt c/o of pounding headache and sore throat.  Pt was in the ED about 1 month ago for fainting episodes.  Pt was able to ambulate to the ED room for triage.

## 2013-07-17 NOTE — ED Provider Notes (Signed)
CSN: [id number]     Arrival date & time 07/17/13  0654 History   First MD Initiated Contact with Patient 07/17/13 [phone]     Chief Complaint  Patient presents with  . Seizures   (Consider location/radiation/quality/duration/timing/severity/associated sxs/prior Treatment) Patient is a 28 y.o. female presenting with seizures. The history is provided by the patient.  Seizures  patient here after having 2 witnessed seizures by her husband just prior to arrival. Marena Chancy how long the seizures lasted for an and described as generalized tonic-clonic with a postictal period as well as some bladder incontinence. She has a prior history of seizures and now does have a headache. No recent illnesses. Denies any alcohol or drug use. Feels back to her baseline at this time. Denies any focal neurological deficits. No photophobia or neck pain. No medications used prior to arrival  Past Medical History  Diagnosis Date  . Menorrhagia   . Anemia   . Anxiety    Past Surgical History  Procedure Laterality Date  . Cholecystectomy    . Tubal ligation     Family History  Problem Relation Age of Onset  . Diabetes Father    History  Substance Use Topics  . Smoking status: Former Smoker    Quit date: 05/13/2009  . Smokeless tobacco: Never Used  . Alcohol Use: 0.6 oz/week    1 Glasses of wine per week     Comment: occaional   OB History   Grav Para Term Preterm Abortions TAB SAB Ect Mult Living   5 4 2 2 1  1   4      Review of Systems  Neurological: Positive for seizures.  All other systems reviewed and are negative.    Allergies  Shrimp  Home Medications   Current Outpatient Rx  Name  Route  Sig  Dispense  Refill  . acetaminophen (TYLENOL) 500 MG tablet   Oral   Take 1,000 mg by mouth every 6 (six) hours as needed for mild pain.         . Aspirin-Acetaminophen-Caffeine (GOODY HEADACHE PO)   Oral   Take 1 packet by mouth daily as needed (for pain).         Marland Kitchen ibuprofen (ADVIL,MOTRIN)  200 MG tablet   Oral   Take 800 mg by mouth every 8 (eight) hours as needed for moderate pain.         Marland Kitchen ibuprofen (ADVIL,MOTRIN) 600 MG tablet   Oral   Take 1 tablet (600 mg total) by mouth every 6 (six) hours as needed.   30 tablet   0   . LORazepam (ATIVAN) 1 MG tablet   Oral   Take 1 mg by mouth every 8 (eight) hours as needed for anxiety.         Marland Kitchen oxyCODONE-acetaminophen (PERCOCET/ROXICET) 5-325 MG per tablet   Oral   Take 1-2 tablets by mouth every 4 (four) hours as needed for severe pain.   20 tablet   0   . Prenatal Vit-Fe Fumarate-FA (PRENATAL MULTIVITAMIN) TABS tablet   Oral   Take 1 tablet by mouth daily at 12 noon.         Marland Kitchen RANITIDINE HCL PO   Oral   Take 1 tablet by mouth 2 (two) times daily.          BP 103/40  Pulse 86  Temp(Src) 98.1 F (36.7 C) (Oral)  Resp 14  Ht 5\' 4"  (1.626 m)  Wt 193 lb (87.544 kg)  BMI  33.11 kg/m2  SpO2 99%  LMP 07/04/2013 Physical Exam  Nursing note and vitals reviewed. Constitutional: She is oriented to person, place, and time. She appears well-developed and well-nourished.  Non-toxic appearance. No distress.  HENT:  Head: Normocephalic and atraumatic.  Eyes: Conjunctivae, EOM and lids are normal. Pupils are equal, round, and reactive to light.  Neck: Normal range of motion. Neck supple. No tracheal deviation present. No mass present.  Cardiovascular: Normal rate, regular rhythm and normal heart sounds.  Exam reveals no gallop.   No murmur heard. Pulmonary/Chest: Effort normal and breath sounds normal. No stridor. No respiratory distress. She has no decreased breath sounds. She has no wheezes. She has no rhonchi. She has no rales.  Abdominal: Soft. Normal appearance and bowel sounds are normal. She exhibits no distension. There is no tenderness. There is no rebound and no CVA tenderness.  Musculoskeletal: Normal range of motion. She exhibits no edema and no tenderness.  Neurological: She is alert and oriented to  person, place, and time. She has normal strength. No cranial nerve deficit or sensory deficit. GCS eye subscore is 4. GCS verbal subscore is 5. GCS motor subscore is 6.  Skin: Skin is warm and dry. No abrasion and no rash noted.  Psychiatric: She has a normal mood and affect. Her speech is normal and behavior is normal.    ED Course  Procedures (including critical care time) Labs Review Labs Reviewed - No data to display Imaging Review No results found.  EKG Interpretation   None       MDM  No diagnosis found. Patient's head CT is negative and electrolytes show no severe abnormalities. Neurological exam is stable here. Patient to be given referral to neurology as well it as seizure precautions and was told not to drive a vehicle   Leota Jacobsen, MD 07/17/13 [phone]

## 2013-07-17 NOTE — ED Notes (Signed)
PT offered Pain med for HA but refused . Pt stated she would take  Motrin when home.

## 2013-07-17 NOTE — ED Notes (Signed)
PT refused oxycodone for HA

## 2013-07-17 NOTE — Discharge Instructions (Signed)
Seizure, Adult °A seizure is abnormal electrical activity in the brain. Seizures usually last from 30 seconds to 2 minutes. There are various types of seizures. °Before a seizure, you may have a warning sensation (aura) that a seizure is about to occur. An aura may include the following symptoms:  °· Fear or anxiety. °· Nausea. °· Feeling like the room is spinning (vertigo). °· Vision changes, such as seeing flashing lights or spots. °Common symptoms during a seizure include: °· A change in attention or behavior (altered mental status). °· Convulsions with rhythmic jerking movements. °· Drooling. °· Rapid eye movements. °· Grunting. °· Loss of bladder and bowel control. °· Bitter taste in the mouth. °· Tongue biting. °After a seizure, you may feel confused and sleepy. You may also have an injury resulting from convulsions during the seizure. °HOME CARE INSTRUCTIONS  °· If you are given medicines, take them exactly as prescribed by your health care provider. °· Keep all follow-up appointments as directed by your health care provider. °· Do not swim or drive or engage in risky activity during which a seizure could cause further injury to you or others until your health care provider says it is OK. °· Get adequate rest. °· Teach friends and family what to do if you have a seizure. They should: °· Lay you on the ground to prevent a fall. °· Put a cushion under your head. °· Loosen any tight clothing around your neck. °· Turn you on your side. If vomiting occurs, this helps keep your airway clear. °· Stay with you until you recover. °· Know whether or not you need emergency care. °SEEK IMMEDIATE MEDICAL CARE IF: °· The seizure lasts longer than 5 minutes. °· The seizure is severe or you do not wake up immediately after the seizure. °· You have an altered mental status after the seizure. °· You are having more frequent or worsening seizures. °Someone should drive you to the emergency department or call local emergency  services (911 in [place] °MAKE SURE YOU: °· Understand these instructions. °· Will watch your condition. °· Will get help right away if you are not doing well or get worse. °Document Released: 06/07/2000 Document Revised: 03/31/2013 Document Reviewed: 01/20/2013 °[employer]® Patient Information ©2014 [employer]. ° °Driving and Equipment Restrictions °Some medical problems make it dangerous to drive, ride a bike, or use machines. Some of these problems are: °· A hard blow to the head (concussion). °· Passing out (fainting). °· Twitching and shaking (seizures). °· Low blood sugar. °· Taking medicine to help you relax (sedatives). °· Taking pain medicines. °· Wearing an eye patch. °· Wearing splints. This can make it hard to use parts of your body that you need to drive safely. °HOME CARE  °· Do not drive until your doctor says it is okay. °· Do not use machines until your doctor says it is okay. °You may need a form signed by your doctor (medical release) before you can drive again. You may also need this form before you do other tasks where you need to be fully alert. °MAKE SURE YOU: °· Understand these instructions. °· Will watch your condition. °· Will get help right away if you are not doing well or get worse. °Document Released: 07/18/2004 Document Revised: 09/02/2011 Document Reviewed: 10/18/2009 °[employer]® Patient Information ©2014 [employer]. ° °

## 2013-07-26 ENCOUNTER — Emergency Department (HOSPITAL_COMMUNITY)
Admission: EM | Admit: 2013-07-26 | Discharge: 2013-07-26 | Disposition: A | Discharge status: Home / Self Care | Payer: Self-pay | Attending: Emergency Medicine | Admitting: Emergency Medicine

## 2013-07-26 ENCOUNTER — Encounter (HOSPITAL_COMMUNITY): Payer: Self-pay | Admitting: Emergency Medicine

## 2013-07-26 DIAGNOSIS — Z87891 Personal history of nicotine dependence: Secondary | ICD-10-CM | POA: Insufficient documentation

## 2013-07-26 DIAGNOSIS — R51 Headache: Secondary | ICD-10-CM | POA: Insufficient documentation

## 2013-07-26 DIAGNOSIS — F411 Generalized anxiety disorder: Secondary | ICD-10-CM | POA: Insufficient documentation

## 2013-07-26 DIAGNOSIS — Z3202 Encounter for pregnancy test, result negative: Secondary | ICD-10-CM | POA: Insufficient documentation

## 2013-07-26 DIAGNOSIS — D509 Iron deficiency anemia, unspecified: Secondary | ICD-10-CM | POA: Insufficient documentation

## 2013-07-26 DIAGNOSIS — D649 Anemia, unspecified: Secondary | ICD-10-CM | POA: Insufficient documentation

## 2013-07-26 DIAGNOSIS — Z8742 Personal history of other diseases of the female genital tract: Secondary | ICD-10-CM | POA: Insufficient documentation

## 2013-07-26 DIAGNOSIS — Z79899 Other long term (current) drug therapy: Secondary | ICD-10-CM | POA: Insufficient documentation

## 2013-07-26 DIAGNOSIS — R55 Syncope and collapse: Secondary | ICD-10-CM | POA: Insufficient documentation

## 2013-07-26 DIAGNOSIS — R002 Palpitations: Secondary | ICD-10-CM | POA: Insufficient documentation

## 2013-07-26 LAB — BASIC METABOLIC PANEL
BUN: 9 mg/dL (ref 6–23)
CALCIUM: 8.8 mg/dL (ref 8.4–10.5)
CO2: 23 mEq/L (ref 19–32)
Chloride: 104 mEq/L (ref 96–112)
Creatinine, Ser: 0.69 mg/dL (ref 0.50–1.10)
GFR calc Af Amer: >90 mL/min (ref >90)
GFR calc non Af Amer: >90 mL/min (ref >90)
GLUCOSE: 94 mg/dL (ref 70–99)
POTASSIUM: 4 meq/L (ref 3.7–5.3)
SODIUM: 139 meq/L (ref 137–147)

## 2013-07-26 LAB — URINALYSIS, ROUTINE W REFLEX MICROSCOPIC
Bilirubin Urine: NEGATIVE
Glucose, UA: NEGATIVE mg/dL
Hgb urine dipstick: NEGATIVE
Ketones, ur: NEGATIVE mg/dL
LEUKOCYTES UA: NEGATIVE
Nitrite: NEGATIVE
PROTEIN: NEGATIVE mg/dL
SPECIFIC GRAVITY, URINE: 1.025 (ref 1.005–1.030)
UROBILINOGEN UA: 0.2 mg/dL (ref 0.0–1.0)
pH: 7 (ref 5.0–8.0)

## 2013-07-26 LAB — CBC WITH DIFFERENTIAL/PLATELET
BASOS PCT: 1 % (ref 0–1)
Basophils Absolute: 0 10*3/uL (ref 0.0–0.1)
Eosinophils Absolute: 0.1 10*3/uL (ref 0.0–0.7)
Eosinophils Relative: 2 % (ref 0–5)
HCT: 27.6 % — ABNORMAL LOW (ref 36.0–46.0)
HEMOGLOBIN: 8.4 g/dL — AB (ref 12.0–15.0)
LYMPHS PCT: 45 % (ref 12–46)
Lymphs Abs: 1.8 10*3/uL (ref 0.7–4.0)
MCH: 18.8 pg — AB (ref 26.0–34.0)
MCHC: 30.4 g/dL (ref 30.0–36.0)
MCV: 61.6 fL — ABNORMAL LOW (ref 78.0–100.0)
MONO ABS: 0.3 10*3/uL (ref 0.1–1.0)
Monocytes Relative: 7 % (ref 3–12)
NEUTROS PCT: 45 % (ref 43–77)
Neutro Abs: 1.8 10*3/uL (ref 1.7–7.7)
PLATELETS: 226 10*3/uL (ref 150–400)
RBC: 4.48 MIL/uL (ref 3.87–5.11)
RDW: 21.1 % — ABNORMAL HIGH (ref 11.5–15.5)
WBC: 4 10*3/uL (ref 4.0–10.5)

## 2013-07-26 LAB — PREGNANCY, URINE: PREG TEST UR: NEGATIVE

## 2013-07-26 NOTE — Discharge Instructions (Signed)
Begin taking iron sulfate (FeSO4) 325 mg once daily in addition to your vitamins.  Dr. Irven Shelling office will call you to arrange a followup appointment and set up Holter monitoring.  Return to the emergency department if you develop severe chest pain or other new or concerning symptoms.   Palpitations  A palpitation is the feeling that your heartbeat is irregular or is faster than normal. It may feel like your heart is fluttering or skipping a beat. Palpitations are usually not a serious problem. However, in some cases, you may need further medical evaluation. CAUSES  Palpitations can be caused by:  Smoking.  Caffeine or other stimulants, such as diet pills or energy drinks.  Alcohol.  Stress and anxiety.  Strenuous physical activity.  Fatigue.  Certain medicines.  Heart disease, especially if you have a history of arrhythmias. This includes atrial fibrillation, atrial flutter, or supraventricular tachycardia.  An improperly working pacemaker or defibrillator. DIAGNOSIS  To find the cause of your palpitations, your caregiver will take your history and perform a physical exam. Tests may also be done, including:  Electrocardiography (ECG). This test records the heart's electrical activity.  Cardiac monitoring. This allows your caregiver to monitor your heart rate and rhythm in real time.  Holter monitor. This is a portable device that records your heartbeat and can help diagnose heart arrhythmias. It allows your caregiver to track your heart activity for several days, if needed.  Stress tests by exercise or by giving medicine that makes the heart beat faster. TREATMENT  Treatment of palpitations depends on the cause of your symptoms and can vary greatly. Most cases of palpitations do not require any treatment other than time, relaxation, and monitoring your symptoms. Other causes, such as atrial fibrillation, atrial flutter, or supraventricular tachycardia, usually require further  treatment. HOME CARE INSTRUCTIONS   Avoid:  Caffeinated coffee, tea, soft drinks, diet pills, and energy drinks.  Chocolate.  Alcohol.  Stop smoking if you smoke.  Reduce your stress and anxiety. Things that can help you relax include:  A method that measures bodily functions so you can learn to control them (biofeedback).  Yoga.  Meditation.  Physical activity such as swimming, jogging, or walking.  Get plenty of rest and sleep. SEEK MEDICAL CARE IF:   You continue to have a fast or irregular heartbeat beyond 24 hours.  Your palpitations occur more often. SEEK IMMEDIATE MEDICAL CARE IF:  You develop chest pain or shortness of breath.  You have a severe headache.  You feel dizzy, or you faint. MAKE SURE YOU:  Understand these instructions.  Will watch your condition.  Will get help right away if you are not doing well or get worse. Document Released: 06/07/2000 Document Revised: 10/05/2012 Document Reviewed: 08/09/2011 [facility] Patient Information 2014 [place].  Syncope Syncope is a fainting spell. This means the person loses consciousness and drops to the ground. The person is generally unconscious for less than 5 minutes. The person may have some muscle twitches for up to 15 seconds before waking up and returning to normal. Syncope occurs more often in elderly people, but it can happen to anyone. While most causes of syncope are not dangerous, syncope can be a sign of a serious medical problem. It is important to seek medical care.  CAUSES  Syncope is caused by a sudden decrease in blood flow to the brain. The specific cause is often not determined. Factors that can trigger syncope include:  Taking medicines that lower blood pressure.  Sudden  changes in posture, such as standing up suddenly.  Taking more medicine than prescribed.  Standing in one place for too long.  Seizure disorders.  Dehydration and excessive exposure to heat.  Low blood  sugar (hypoglycemia).  Straining to have a bowel movement.  Heart disease, irregular heartbeat, or other circulatory problems.  Fear, emotional distress, seeing blood, or severe pain. SYMPTOMS  Right before fainting, you may:  Feel dizzy or lightheaded.  Feel nauseous.  See all white or all black in your field of vision.  Have cold, clammy skin. DIAGNOSIS  Your caregiver will ask about your symptoms, perform a physical exam, and perform electrocardiography (ECG) to record the electrical activity of your heart. Your caregiver may also perform other heart or blood tests to determine the cause of your syncope. TREATMENT  In most cases, no treatment is needed. Depending on the cause of your syncope, your caregiver may recommend changing or stopping some of your medicines. HOME CARE INSTRUCTIONS  Have someone stay with you until you feel stable.  Do not drive, operate machinery, or play sports until your caregiver says it is okay.  Keep all follow-up appointments as directed by your caregiver.  Lie down right away if you start feeling like you might faint. Breathe deeply and steadily. Wait until all the symptoms have passed.  Drink enough fluids to keep your urine clear or pale yellow.  If you are taking blood pressure or heart medicine, get up slowly, taking several minutes to sit and then stand. This can reduce dizziness. SEEK IMMEDIATE MEDICAL CARE IF:   You have a severe headache.  You have unusual pain in the chest, abdomen, or back.  You are bleeding from the mouth or rectum, or you have black or tarry stool.  You have an irregular or very fast heartbeat.  You have pain with breathing.  You have repeated fainting or seizure-like jerking during an episode.  You faint when sitting or lying down.  You have confusion.  You have difficulty walking.  You have severe weakness.  You have vision problems. If you fainted, call your local emergency services (911 in  [place] Do not drive yourself to the hospital.  MAKE SURE YOU:  Understand these instructions.  Will watch your condition.  Will get help right away if you are not doing well or get worse. Document Released: 06/10/2005 Document Revised: 12/10/2011 Document Reviewed: 08/09/2011 [facility] Patient Information 2014 [place].  Iron Deficiency Anemia, Adult Anemia is a condition in which there are less red blood cells or hemoglobin in the blood than normal. Hemoglobin is this part of red blood cells that carries oxygen. Iron deficiency anemia is anemia caused by too little iron. It is the most common type of anemia. It may leave you tired and short of breath. CAUSES   Lack of iron in the diet.  Poor absorption of iron, as seen with intestinal disorders.  Intestinal bleeding.  Heavy periods. SIGNS AND SYMPTOMS  Mild anemia may not be noticeable. Symptoms may include:  Fatigue.  Headache.  Pale skin.  Weakness.  Tiredness.  Shortness of breath.  Dizziness.  Cold hands and feet.  Fast or irregular heartbeat. DIAGNOSIS  Diagnosis requires a thorough evaluation and physical exam by your health care provider. Blood tests are generally used to confirm iron deficiency anemia. Additional tests may be done to find the underlying cause of your anemia. These may include:  Testing for blood in the stool (fecal occult blood test).  A procedure  to see inside the colon and rectum (colonoscopy).  A procedure to see inside the esophagus and stomach (endoscopy). TREATMENT  Iron deficiency anemia is treated by correcting the cause of the deficiency. Treatment may involve:  Adding iron-rich foods to your diet.  Taking iron supplements. Pregnant or breastfeeding women need to take extra iron, because their normal diet usually does not provide the required amount.  Taking vitamins. Vitamin C improves the absorption of iron. Your health care provider may recommend taking your iron  tablets with a glass of orange juice or vitamin C supplement.  Medicines to make heavy menstrual flow lighter.  Surgery. HOME CARE INSTRUCTIONS   Take iron as directed by your health care provider.  If you cannot tolerate taking iron supplements by mouth, talk to your health care provider about taking them through a vein (intravenously) or an injection into a muscle.  For the best iron absorption, iron supplements should be taken on an empty stomach. If you cannot tolerate them on an empty stomach, you may need to take them with food.  Do not drink milk or take antacids at the same time as your iron supplements. Milk and antacids may interfere with the absorption of iron.  Iron supplements can cause constipation. Make sure to include fiber in your diet to prevent constipation. A stool softener may also be recommended.  Take vitamins as directed by your health care provider.  Eat a diet rich in iron. Foods high in iron include liver, lean beef, whole-grain bread, eggs, dried fruit, and dark green, leafy vegetables. SEEK IMMEDIATE MEDICAL CARE IF:   You faint. If this happens, do not drive. Call your local emergency services (911 in [place] if no other help is available.  You have chest pain.  You feel nauseous or vomit.  You have severe or increased shortness of breath with activity.  You feel weak.  You have a rapid heartbeat.  You have unexplained sweating.  You become lightheaded when getting up from a chair or bed. MAKE SURE YOU:   Understand these instructions.  Will watch your condition.  Will get help right away if you are not doing well or get worse. Document Released: 06/07/2000 Document Revised: 03/31/2013 Document Reviewed: 02/15/2013 [facility] Patient Information 2014 [place].

## 2013-07-26 NOTE — ED Notes (Signed)
Pt. Ambulated to bathroom w/o difficulty

## 2013-07-26 NOTE — ED Provider Notes (Signed)
CSN: [id number]     Arrival date & time 07/26/13  0750 History   First MD Initiated Contact with Patient 07/26/13 [id number]     No chief complaint on file.  (Consider location/radiation/quality/duration/timing/severity/associated sxs/prior Treatment) HPI Comments: Patient is a 28 year old female with history of anxiety. She presents today after a syncopal or seizure like episode that occurred at home. She states that she was walking into the bathroom and felt her heart racing. She then fell to the floor and went unresponsive. There was reported tonic clonic type movements that were witnessed by her husband. Her unresponsiveness lasted several moments and she then woke up. She reports a headache but denies any bowel or bladder incontinence. She had a similar episode 2 weeks ago for which she was seen here. Head CT and labs at that time were unremarkable. She tells me that she is waking from sleep pretty much every night with her heart beating rapidly. She's been told in the past that she likely has anxiety.  The history is provided by the patient.    Past Medical History  Diagnosis Date  . Menorrhagia   . Anemia   . Anxiety    Past Surgical History  Procedure Laterality Date  . Cholecystectomy    . Tubal ligation     Family History  Problem Relation Age of Onset  . Diabetes Father    History  Substance Use Topics  . Smoking status: Former Smoker    Quit date: 05/13/2009  . Smokeless tobacco: Never Used  . Alcohol Use: 0.6 oz/week    1 Glasses of wine per week     Comment: occaional   OB History   Grav Para Term Preterm Abortions TAB SAB Ect Mult Living   5 4 2 2 1  1   4      Review of Systems  All other systems reviewed and are negative.    Allergies  Shrimp  Home Medications   Current Outpatient Rx  Name  Route  Sig  Dispense  Refill  . esomeprazole (NEXIUM) 20 MG capsule   Oral   Take 20 mg by mouth daily at 12 noon.         Marland Kitchen LORazepam (ATIVAN) 1 MG tablet  Oral   Take 1 mg by mouth every 8 (eight) hours as needed for anxiety.         . Prenatal Vit-Fe Fumarate-FA (PRENATAL MULTIVITAMIN) TABS tablet   Oral   Take 1 tablet by mouth daily at 12 noon.          BP 106/54  Pulse 87  Temp(Src) 98.9 F (37.2 C)  Resp 24  Ht 5\' 4"  (1.626 m)  Wt 190 lb (86.183 kg)  BMI 32.60 kg/m2  SpO2 99%  LMP 07/04/2013 Physical Exam  Nursing note and vitals reviewed. Constitutional: She is oriented to person, place, and time. She appears well-developed and well-nourished. No distress.  HENT:  Head: Normocephalic and atraumatic.  Eyes: EOM are normal. Pupils are equal, round, and reactive to light.  Neck: Normal range of motion. Neck supple.  Cardiovascular: Normal rate and regular rhythm.  Exam reveals no gallop and no friction rub.   No murmur heard. Pulmonary/Chest: Effort normal and breath sounds normal. No respiratory distress. She has no wheezes.  Abdominal: Soft. Bowel sounds are normal. She exhibits no distension. There is no tenderness.  Musculoskeletal: Normal range of motion.  Neurological: She is alert and oriented to person, place, and time. No cranial nerve deficit.  She exhibits normal muscle tone. Coordination normal.  Skin: Skin is warm and dry. She is not diaphoretic.    ED Course  Procedures (including critical care time) Labs Review Labs Reviewed  CBC WITH DIFFERENTIAL  BASIC METABOLIC PANEL  URINALYSIS, ROUTINE W REFLEX MICROSCOPIC  PREGNANCY, URINE   Imaging Review No results found.  EKG Interpretation    Date/Time:  Monday July 26 2013 07:56:52 EST Ventricular Rate:  89 PR Interval:  175 QRS Duration: 74 QT Interval:  365 QTC Calculation: 444 R Axis:   68 Text Interpretation:  Sinus rhythm Confirmed by DELOS  MD, Moreen Piggott ([id number]) on 07/26/2013 8:27:08 AM            MDM  No diagnosis found. Patient is a 28 year old female with no significant past medical history. She presents today with complaints of  palpitations been occurring intermittently for many months. They seem to occur mostly at night. She has had 2 syncopal episodes in the past 2 weeks. Today's episode was preceded by palpitations. Reviewing her laboratory studies, it is noted that she has been chronically anemic with what appears to be an iron deficiency. Her EKG reveals a normal sinus rhythm with no acute abnormality. It is my opinion that this patient requires Holter monitoring to further evaluate the cause of these symptoms and episodes. I've spoken with Dr. Einar Gip from cardiology who agrees to see the patient in followup and make these arrangements. Her contact information has been given to him and he will have his office call to make these arrangements.    Veryl Speak, MD 07/26/13 1025

## 2013-07-26 NOTE — ED Notes (Signed)
Pt. States that her husband witnessed her have a seizure. Pt does not recall event due to decrease LOC. Has Hx seizing 2 weeks ago. Pt did not take any OTC meds.

## 2013-09-17 ENCOUNTER — Emergency Department (HOSPITAL_COMMUNITY)
Admission: EM | Admit: 2013-09-17 | Discharge: 2013-09-17 | Disposition: A | Discharge status: Home / Self Care | Payer: Self-pay | Attending: Emergency Medicine | Admitting: Emergency Medicine

## 2013-09-17 ENCOUNTER — Encounter (HOSPITAL_COMMUNITY): Payer: Self-pay | Admitting: Emergency Medicine

## 2013-09-17 DIAGNOSIS — Z87891 Personal history of nicotine dependence: Secondary | ICD-10-CM | POA: Insufficient documentation

## 2013-09-17 DIAGNOSIS — S39012A Strain of muscle, fascia and tendon of lower back, initial encounter: Secondary | ICD-10-CM

## 2013-09-17 DIAGNOSIS — Z79899 Other long term (current) drug therapy: Secondary | ICD-10-CM | POA: Insufficient documentation

## 2013-09-17 DIAGNOSIS — Y92410 Unspecified street and highway as the place of occurrence of the external cause: Secondary | ICD-10-CM | POA: Insufficient documentation

## 2013-09-17 DIAGNOSIS — M545 Low back pain, unspecified: Secondary | ICD-10-CM

## 2013-09-17 DIAGNOSIS — Z8742 Personal history of other diseases of the female genital tract: Secondary | ICD-10-CM | POA: Insufficient documentation

## 2013-09-17 DIAGNOSIS — Y9389 Activity, other specified: Secondary | ICD-10-CM | POA: Insufficient documentation

## 2013-09-17 DIAGNOSIS — Z862 Personal history of diseases of the blood and blood-forming organs and certain disorders involving the immune mechanism: Secondary | ICD-10-CM | POA: Insufficient documentation

## 2013-09-17 DIAGNOSIS — F411 Generalized anxiety disorder: Secondary | ICD-10-CM | POA: Insufficient documentation

## 2013-09-17 DIAGNOSIS — S335XXA Sprain of ligaments of lumbar spine, initial encounter: Secondary | ICD-10-CM | POA: Insufficient documentation

## 2013-09-17 MED ORDER — METHOCARBAMOL 500 MG PO TABS
500.0000 mg | ORAL_TABLET | Freq: Once | ORAL | Status: AC
  Administered 2013-09-17: 500 mg via ORAL
  Filled 2013-09-17: qty 1

## 2013-09-17 MED ORDER — IBUPROFEN 400 MG PO TABS
800.0000 mg | ORAL_TABLET | Freq: Once | ORAL | Status: AC
  Administered 2013-09-17: 800 mg via ORAL
  Filled 2013-09-17: qty 2

## 2013-09-17 MED ORDER — NAPROXEN 500 MG PO TABS: 500.0000 mg | ORAL_TABLET | Freq: Two times a day (BID) | ORAL | Status: DC | PRN

## 2013-09-17 MED ORDER — METHOCARBAMOL 750 MG PO TABS: 750.0000 mg | ORAL_TABLET | Freq: Four times a day (QID) | ORAL | Status: DC | PRN

## 2013-09-17 NOTE — Discharge Instructions (Signed)
1. Medications: robaxin, naproxyn, usual home medications 2. Treatment: rest, drink plenty of fluids, gentle stretching as discussed, alternate ice and heat 3. Follow Up: Please followup with your primary doctor for discussion of your diagnoses and further evaluation after today's visit; if you do not have a primary care doctor use the resource guide provided to find one;   Back Exercises Back exercises help treat and prevent back injuries. The goal of back exercises is to increase the strength of your abdominal and back muscles and the flexibility of your back. These exercises should be started when you no longer have back pain. Back exercises include:  Pelvic Tilt. Lie on your back with your knees bent. Tilt your pelvis until the lower part of your back is against the floor. Hold this position 5 to 10 sec and repeat 5 to 10 times.  Knee to Chest. Pull first 1 knee up against your chest and hold for 20 to 30 seconds, repeat this with the other knee, and then both knees. This may be done with the other leg straight or bent, whichever feels better.  Sit-Ups or Curl-Ups. Bend your knees 90 degrees. Start with tilting your pelvis, and do a partial, slow sit-up, lifting your trunk only 30 to 45 degrees off the floor. Take at least 2 to 3 seconds for each sit-up. Do not do sit-ups with your knees out straight. If partial sit-ups are difficult, simply do the above but with only tightening your abdominal muscles and holding it as directed.  Hip-Lift. Lie on your back with your knees flexed 90 degrees. Push down with your feet and shoulders as you raise your hips a couple inches off the floor; hold for 10 seconds, repeat 5 to 10 times.  Back arches. Lie on your stomach, propping yourself up on bent elbows. Slowly press on your hands, causing an arch in your low back. Repeat 3 to 5 times. Any initial stiffness and discomfort should lessen with repetition over time.  Shoulder-Lifts. Lie face down with arms  beside your body. Keep hips and torso pressed to floor as you slowly lift your head and shoulders off the floor. Do not overdo your exercises, especially in the beginning. Exercises may cause you some mild back discomfort which lasts for a few minutes; however, if the pain is more severe, or lasts for more than 15 minutes, do not continue exercises until you see your caregiver. Improvement with exercise therapy for back problems is slow.  See your caregivers for assistance with developing a proper back exercise program. Document Released: 07/18/2004 Document Revised: 09/02/2011 Document Reviewed: 04/11/2011 [facility] Patient Information 2014 [place].   Lumbosacral Strain Lumbosacral strain is a strain of any of the parts that make up your lumbosacral vertebrae. Your lumbosacral vertebrae are the bones that make up the lower third of your backbone. Your lumbosacral vertebrae are held together by muscles and tough, fibrous tissue (ligaments).  CAUSES  A sudden blow to your back can cause lumbosacral strain. Also, anything that causes an excessive stretch of the muscles in the low back can cause this strain. This is typically seen when people exert themselves strenuously, fall, lift heavy objects, bend, or crouch repeatedly. RISK FACTORS  Physically demanding work.  Participation in pushing or pulling sports or sports that require sudden twist of the back (tennis, golf, baseball).  Weight lifting.  Excessive lower back curvature.  Forward-tilted pelvis.  Weak back or abdominal muscles or both.  Tight hamstrings. SIGNS AND SYMPTOMS  Lumbosacral  strain may cause pain in the area of your injury or pain that moves (radiates) down your leg.  DIAGNOSIS Your health care provider can often diagnose lumbosacral strain through a physical exam. In some cases, you may need tests such as X-ray exams.  TREATMENT  Treatment for your lower back injury depends on many factors that your clinician  will have to evaluate. However, most treatment will include the use of anti-inflammatory medicines. HOME CARE INSTRUCTIONS   Avoid hard physical activities (tennis, racquetball, waterskiing) if you are not in proper physical condition for it. This may aggravate or create problems.  If you have a back problem, avoid sports requiring sudden body movements. Swimming and walking are generally safer activities.  Maintain good posture.  Maintain a healthy weight.  For acute conditions, you may put ice on the injured area.  Put ice in a plastic bag.  Place a towel between your skin and the bag.  Leave the ice on for 20 minutes, 2 3 times a day.  When the low back starts healing, stretching and strengthening exercises may be recommended. SEEK MEDICAL CARE IF:  Your back pain is getting worse.  You experience severe back pain not relieved with medicines. SEEK IMMEDIATE MEDICAL CARE IF:   You have numbness, tingling, weakness, or problems with the use of your arms or legs.  There is a change in bowel or bladder control.  You have increasing pain in any area of the body, including your belly (abdomen).  You notice shortness of breath, dizziness, or feel faint.  You feel sick to your stomach (nauseous), are throwing up (vomiting), or become sweaty.  You notice discoloration of your toes or legs, or your feet get very cold. MAKE SURE YOU:   Understand these instructions.  Will watch your condition.  Will get help right away if you are not doing well or get worse. Document Released: 03/20/2005 Document Revised: 03/31/2013 Document Reviewed: 01/27/2013 [facility] Patient Information 2014 [place], [place].    Emergency Department Resource Guide 1) Find a Doctor and Pay Out of Pocket Although you won't have to find out who is covered by your insurance plan, it is a good idea to ask around and get recommendations. You will then need to call the office and see if the doctor you have  chosen will accept you as a new patient and what types of options they offer for patients who are self-pay. Some doctors offer discounts or will set up payment plans for their patients who do not have insurance, but you will need to ask so you aren't surprised when you get to your appointment.  2) Contact Your Local Health Department Not all health departments have doctors that can see patients for sick visits, but many do, so it is worth a call to see if yours does. If you don't know where your local health department is, you can check in your phone book. The CDC also has a tool to help you locate your state's health department, and many state websites also have listings of all of their local health departments.  3) Find a Walk-in Clinic If your illness is not likely to be very severe or complicated, you may want to try a walk in clinic. These are popping up all over the country in pharmacies, drugstores, and shopping centers. They're usually staffed by nurse practitioners or physician assistants that have been trained to treat common illnesses and complaints. They're usually fairly quick and inexpensive. However, if you have serious  medical issues or chronic medical problems, these are probably not your best option.  No Primary Care Doctor: - Call Health Connect at  [phone] - they can help you locate a primary care doctor that  accepts your insurance, provides certain services, etc. - Physician Referral Service- [phone]  Chronic Pain Problems: Organization         Address  Phone   Notes  [facility]  [phone] Patients need to be referred by their primary care doctor.   Medication Assistance: Organization         Address  Phone   Notes  [facility] [address] [place], [place] [address] [phone] --Must be a resident of [facility] -- Must have NO insurance coverage whatsoever (no Medicaid/ Medicare,  etc.) -- The pt. MUST have a primary care doctor that directs their care regularly and follows them in the community   MedAssist  [phone]   [employer]  [phone]    Agencies that provide inexpensive medical care: Organization         Address  Phone   Notes  [place]  [phone]   [facility]    [phone]   [facility] [place],  [phone]   [place] [address] [place] [phone]   Planned Parenthood    [phone]   [facility]    [phone]   [place] and [place] [address], [place] Phone:  [phone], Fax:  [phone] Hours of Operation:  9 am - 6 pm, M-F.  Also accepts Medicaid/Medicare and self-pay.  [facility] for [place], [address], [place] Phone: [phone], Fax: [phone]. Hours of Operation:  8:30 am - 5:30 pm, M-F.  Also accepts Medicaid and self-pay.  [facility] [address] [place] Phone: [phone]   [place], [place], [place] [phone], Ext. [phone] Mondays & Thursdays: 7-9 AM.  First 15 patients are seen on a first come, first serve basis.    [place] Providers:  Organization         Address  Phone   Notes  [facility] [address], [place] [phone] Also accepts self-pay patients.  [facility] [address], [place]  [phone]   [place], [address], [place] [phone]   [facility] [address] [place] [phone]   Lucianne Lei [address], [place]   [phone] Only accepts [place] Access [place] patients after they have their name applied to their card.   Self-Pay (no  insurance) in [facility]:  Organization         Address  Phone   Notes  Sickle Cell Patients, [facility] [place] [phone]   [facility] [place] [phone]   [facility] [place]  [place], [address], [place] [phone]   [facility]/Dr. Osei-Bonsu  [address] [place] or [address], [place] [phone] Phone number for both [place] and [place] locations is  the same.  Urgent Medical and [facility] [address] [place] [phone]   [facility] [address], [place] or [address] [phone]   [facility] [address] [place] [phone], phone; [phone], fax Sees patients 1st and 3rd Saturday of every month.  Must not qualify for public or private insurance (i.e. Medicaid, Medicare, [place] Health Choice, Veterans' Benefits)  Household income should be no more than 200% of the poverty level The clinic cannot treat you if you are pregnant or think you are pregnant  Sexually transmitted diseases are not treated at the clinic.   Dental Care: Organization         Address  Phone  Notes  [facility] Department of [facility] [place] [phone] Accepts children up to age 8 who are enrolled in [place] or [place]; pregnant women with a Medicaid card; and children who have applied for Medicaid or [place] Health Choice, but were declined, whose parents can pay a reduced fee at time of service.  [facility] Department of [facility]  [address], [place] [phone] Accepts children up to age 88 who are enrolled in [place] or [place]; pregnant women with a Medicaid card; and children who have applied for Medicaid or [place] Health Choice, but were declined,  whose parents can pay a reduced fee at time of service.  [place] Adult Dental Access PROGRAM  [place] [phone] Patients are seen by appointment only. Walk-ins are not accepted. [place] will see patients 43 years of age and older. Monday - Tuesday (8am-5pm) Most Wednesdays (8:30-5pm) $30 per visit, cash only  [facility] Adult Dental Access PROGRAM  [address], [facility] [phone] Patients are seen by appointment only. Walk-ins are not accepted. [place] will see patients 26 years of age and older. One Wednesday Evening (Monthly: Volunteer Based).  $30 per visit, cash only  [place]  [phone] for adults; Children under age 42, call Graduate Pediatric Dentistry at [phone]. Children aged 59-14, please call [phone] to request a pediatric application.  Dental services are provided in all areas of dental care including fillings, crowns and bridges, complete and partial dentures, implants, gum treatment, root canals, and extractions. Preventive care is also provided. Treatment is provided to both adults and children. Patients are selected via a lottery and there is often a waiting list.   [facility] [address] [place]  [phone] [email or web address]   [facility] [address] [place], [place] [phone], Ext. [phone] Second and Fourth Thursday of each month, opens at 6:30 AM; Clinic ends at 9 AM.  Patients are seen on a first-come first-served basis, and a limited number are seen during each clinic.   [facility]  [address] Hillard Danker [place], [place] [phone]   Eligibility Requirements You must have lived in [place], [place], or [place] counties for at least the last three months.   You cannot be eligible for state or federal sponsored [employer], including [employer], [place], or [employer].   You generally cannot be eligible  for healthcare insurance through your employer.    How to apply: Eligibility screenings are held every Tuesday and Wednesday afternoon from 1:00 pm until 4:00 pm. You do not need an appointment for  the interview!  [facility] [address] [place], [place]   [place]  [place] Department  [place]  [phone]    Behavioral Health Resources in the Community: Intensive Outpatient Programs Organization         Address  Phone  Notes  [place]. [address], [place], [place] [phone]   [facility] Outpatient [address], [place], [place]   ADS: Alcohol & Drug Svcs [address], [place], [place]   [place] [address],  [place], [place] or [phone]   Substance Abuse Resources Organization         Address  Phone  Notes  Alcohol and Drug Services  [phone]   [place]  [phone]   [facility] [place]   Chinita Pester  [phone]   Residential & Outpatient Substance Abuse Program  [phone]   Psychological Services Organization         Address  Phone  Notes  [facility] [place]  [place]  [phone]   [place] [address], [place] or [phone]    Mobile Crisis Teams Organization         Address  Phone  Notes  [facility], Mobile Crisis Care Unit  [phone]   [facility]  [address] [place], [place]   [place] [address], [place] [phone]    Self-Help/Support Groups Organization         Address  Phone             Notes  [place]. of [place] - variety of support groups  [place] Call for more information  Narcotics  Anonymous (NA), [facility] [address], [employer] [place]  2 meetings at this location   Special educational needs teacher         Address  Phone  Notes  [facility] [place],    [place]  [phone]   [facility]  [address] [place] [id number], [place], [place]   [place], [place] [phone] Admissions: 8am-3pm M-F  Incentives Substance [place] [address]    [place], [place] [phone]   [facility] [address] [place], [place], [place]   [facility] [address],  [place], [place]   [facility] Programs - Intensive Outpatient [address] Kristeen Mans [address], [place], [place]   [facility] ([place].) [place].,  [place], [place] [phone] or [phone]   [facility] (RTS) [address], [place], [place] Accepts Medicaid  [facility] [place] [address],  [place] [phone] Substance Abuse/Addiction Treatment   [facility] Organization         Address  Phone  Notes  [facility]  [phone]   Domenic Schwab, PhD [address] Arlis Porta [place], [place]   [phone] or [phone]   [place], [place] [phone]   [facility] [address] [place], [place] [phone] Insurance/Medicaid/sponsorship through [employer] and Families [address] [id number]  [place], [place] [phone] [place], [place] [phone]    Dr. Adele Schilder  [phone]   [facility] Dept. 1) [address] [place] 2) [place] 3)  [place] [address], [place] [phone]  [phone]   [place] [phone] or [phone] (After Hours)
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
## 2013-09-19 NOTE — ED Provider Notes (Signed)
Medical screening examination/treatment/procedure(s) were performed by non-physician practitioner and as supervising physician I was immediately available for consultation/collaboration.   [name]. Dorna Mai, MD 09/19/13 0025

## 2013-10-21 ENCOUNTER — Emergency Department (HOSPITAL_COMMUNITY)
Admission: EM | Admit: 2013-10-21 | Discharge: 2013-10-21 | Disposition: A | Discharge status: Home / Self Care | Payer: Self-pay | Attending: Emergency Medicine | Admitting: Emergency Medicine

## 2013-10-21 ENCOUNTER — Encounter (HOSPITAL_COMMUNITY): Payer: Self-pay | Admitting: Emergency Medicine

## 2013-10-21 DIAGNOSIS — L299 Pruritus, unspecified: Secondary | ICD-10-CM | POA: Insufficient documentation

## 2013-10-21 DIAGNOSIS — Z79899 Other long term (current) drug therapy: Secondary | ICD-10-CM | POA: Insufficient documentation

## 2013-10-21 DIAGNOSIS — Z8742 Personal history of other diseases of the female genital tract: Secondary | ICD-10-CM | POA: Insufficient documentation

## 2013-10-21 DIAGNOSIS — Z87891 Personal history of nicotine dependence: Secondary | ICD-10-CM | POA: Insufficient documentation

## 2013-10-21 DIAGNOSIS — R112 Nausea with vomiting, unspecified: Secondary | ICD-10-CM | POA: Insufficient documentation

## 2013-10-21 DIAGNOSIS — K089 Disorder of teeth and supporting structures, unspecified: Secondary | ICD-10-CM | POA: Insufficient documentation

## 2013-10-21 DIAGNOSIS — K0889 Other specified disorders of teeth and supporting structures: Secondary | ICD-10-CM

## 2013-10-21 DIAGNOSIS — Z862 Personal history of diseases of the blood and blood-forming organs and certain disorders involving the immune mechanism: Secondary | ICD-10-CM | POA: Insufficient documentation

## 2013-10-21 DIAGNOSIS — Z8659 Personal history of other mental and behavioral disorders: Secondary | ICD-10-CM | POA: Insufficient documentation

## 2013-10-21 DIAGNOSIS — T4995XA Adverse effect of unspecified topical agent, initial encounter: Secondary | ICD-10-CM | POA: Insufficient documentation

## 2013-10-21 DIAGNOSIS — T7840XA Allergy, unspecified, initial encounter: Secondary | ICD-10-CM

## 2013-10-21 DIAGNOSIS — R21 Rash and other nonspecific skin eruption: Secondary | ICD-10-CM | POA: Insufficient documentation

## 2013-10-21 MED ORDER — PREDNISONE 20 MG PO TABS
40.0000 mg | ORAL_TABLET | Freq: Once | ORAL | Status: AC
  Administered 2013-10-21: 40 mg via ORAL
  Filled 2013-10-21: qty 2

## 2013-10-21 MED ORDER — PREDNISONE 20 MG PO TABS: 40.0000 mg | ORAL_TABLET | Freq: Every day | ORAL | Status: DC

## 2013-10-21 MED ORDER — CLINDAMYCIN HCL 150 MG PO CAPS: 150.0000 mg | ORAL_CAPSULE | Freq: Four times a day (QID) | ORAL | Status: DC

## 2013-10-21 NOTE — ED Provider Notes (Signed)
CSN: [id number]     Arrival date & time 10/21/13  0713 History   First MD Initiated Contact with Patient 10/21/13 [phone]     Chief Complaint  Patient presents with  . Allergic Reaction     (Consider location/radiation/quality/duration/timing/severity/associated sxs/prior Treatment) HPI Comments: Pt took some old penicillin prescribed to her a few months ago for first time at both 12 and 12:30 PM yesterday.  She has had PCN in the past when younger with no problems.   She began feeling palpitations, reflux problems and itching about 15-20 minutes following.  At home, she took some benadryl (half tablet only) withour much relief.  She may have felt some tongue swelling, but denies sig swelling now, no wheezing, SOB.  She did have some N/V without abd pain, back pain.  Nausea improved.  She reports she has to go to work today.  Patient is a 28 y.o. female presenting with allergic reaction. The history is provided by the patient.  Allergic Reaction Presenting symptoms: itching and rash   Presenting symptoms: no difficulty breathing, no difficulty swallowing, no swelling and no wheezing   Severity:  Moderate Prior allergic episodes:  No prior episodes Context: medications   Relieved by:  Nothing Worsened by:  Nothing tried Ineffective treatments:  Antihistamines   Past Medical History  Diagnosis Date  . Menorrhagia   . Anemia   . Anxiety    Past Surgical History  Procedure Laterality Date  . Cholecystectomy    . Tubal ligation     Family History  Problem Relation Age of Onset  . Diabetes Father    History  Substance Use Topics  . Smoking status: Former Smoker    Quit date: 05/13/2009  . Smokeless tobacco: Never Used  . Alcohol Use: 0.6 oz/week    1 Glasses of wine per week     Comment: occaional   OB History   Grav Para Term Preterm Abortions TAB SAB Ect Mult Living   5 4 2 2 1  1   4      Review of Systems  Constitutional: Negative for fever and chills.  HENT: Negative  for trouble swallowing.   Respiratory: Negative for shortness of breath and wheezing.   Gastrointestinal: Positive for nausea and vomiting. Negative for abdominal pain.  Skin: Positive for itching and rash.      Allergies  Shrimp  Home Medications   Prior to Admission medications   Medication Sig Start Date End Date Taking? Authorizing Provider  esomeprazole (NEXIUM) 20 MG capsule Take 20 mg by mouth daily at 12 noon.    Historical Provider, MD  methocarbamol (ROBAXIN) 750 MG tablet Take 1 tablet (750 mg total) by mouth 4 (four) times daily as needed for muscle spasms (Take 1 tablet every 6 hours as needed for muscle spasms.). 09/17/13   Hannah Muthersbaugh, PA-C  naproxen (NAPROSYN) 500 MG tablet Take 1 tablet (500 mg total) by mouth 2 (two) times daily as needed. 09/17/13   Jarrett Soho Muthersbaugh, PA-C  Prenatal Vit-Fe Fumarate-FA (PRENATAL MULTIVITAMIN) TABS tablet Take 1 tablet by mouth daily at 12 noon.    Historical Provider, MD   BP 116/97  Pulse 80  Temp(Src) 97.9 F (36.6 C) (Oral)  Resp 18  Wt 184 lb (83.462 kg)  SpO2 100% Physical Exam  Nursing note and vitals reviewed. Constitutional: She appears well-developed and well-nourished. No distress.  HENT:  Head: Normocephalic and atraumatic.  Mouth/Throat: Uvula is midline, oropharynx is clear and moist and mucous membranes are  normal.    Eyes: Conjunctivae are normal. No scleral icterus.  Cardiovascular: Normal rate and regular rhythm.   Pulmonary/Chest: Effort normal. No respiratory distress.  Abdominal: Soft. She exhibits no distension. There is no tenderness. There is no rebound and no guarding.  Neurological: She is alert. Coordination normal.  Skin: Skin is warm. She is not diaphoretic.  Itching, pruritis    ED Course  Procedures (including critical care time) Labs Review Labs Reviewed - No data to display  Imaging Review No results found.   EKG Interpretation None      MDM   Final diagnoses:    Allergic reaction  Pain, dental    Pt with pruritis, timing suggests allergic reaction.  Pt appears well.  Has a h/o anxiety, could be a component.  Suggested benadryl at home, but due to risk of drowsiness, suggested she take at home when not having to work.  Prednisone now.  Change Rx to clindmaycin    [name]. Dorna Mai, MD 10/21/13 [phone]

## 2013-10-21 NOTE — ED Notes (Addendum)
Pt took 2 PCN for a tooth abcess and began to have n/v w/ SOB around 12 noon. Currently having itching and whelps on the skin. Took 1/2 of benadryl for the itching. Currently A/O headache pain 7/10.

## 2013-10-21 NOTE — ED Notes (Signed)
MD at bedside. 

## 2013-10-21 NOTE — Discharge Instructions (Signed)
Dental Care and Dentist Visits Dental care supports good overall health. Regular dental visits can also help you avoid dental pain, bleeding, infection, and other more serious health problems in the future. It is important to keep the mouth healthy because diseases in the teeth, gums, and other oral tissues can spread to other areas of the body. Some problems, such as diabetes, heart disease, and pre-term labor have been associated with poor oral health.  See your dentist every 6 months. If you experience emergency problems such as a toothache or broken tooth, go to the dentist right away. If you see your dentist regularly, you may catch problems early. It is easier to be treated for problems in the early stages.  WHAT TO EXPECT AT A DENTIST VISIT  Your dentist will look for many common oral health problems and recommend proper treatment. At your regular dental visit, you can expect:  Gentle cleaning of the teeth and gums. This includes scraping and polishing. This helps to remove the sticky substance around the teeth and gums (plaque). Plaque forms in the mouth shortly after eating. Over time, plaque hardens on the teeth as tartar. If tartar is not removed regularly, it can cause problems. Cleaning also helps remove stains.  Periodic X-rays. These pictures of the teeth and supporting bone will help your dentist assess the health of your teeth.  Periodic fluoride treatments. Fluoride is a natural mineral shown to help strengthen teeth. Fluoride treatmentinvolves applying a fluoride gel or varnish to the teeth. It is most commonly done in children.  Examination of the mouth, tongue, jaws, teeth, and gums to look for any oral health problems, such as:  Cavities (dental caries). This is decay on the tooth caused by plaque, sugar, and acid in the mouth. It is best to catch a cavity when it is small.  Inflammation of the gums caused by plaque buildup (gingivitis).  Problems with the mouth or malformed  or misaligned teeth.  Oral cancer or other diseases of the soft tissues or jaws. KEEP YOUR TEETH AND GUMS HEALTHY For healthy teeth and gums, follow these general guidelines as well as your dentist's specific advice:  Have your teeth professionally cleaned at the dentist every 6 months.  Brush twice daily with a fluoride toothpaste.  Floss your teeth daily.  Ask your dentist if you need fluoride supplements, treatments, or fluoride toothpaste.  Eat a healthy diet. Reduce foods and drinks with added sugar.  Avoid smoking. TREATMENT FOR ORAL HEALTH PROBLEMS If you have oral health problems, treatment varies depending on the conditions present in your teeth and gums.  Your caregiver will most likely recommend good oral hygiene at each visit.  For cavities, gingivitis, or other oral health disease, your caregiver will perform a procedure to treat the problem. This is typically done at a separate appointment. Sometimes your caregiver will refer you to another dental specialist for specific tooth problems or for surgery. SEEK IMMEDIATE DENTAL CARE IF:  You have pain, bleeding, or soreness in the gum, tooth, jaw, or mouth area.  A permanent tooth becomes loose or separated from the gum socket.  You experience a blow or injury to the mouth or jaw area. Document Released: 02/20/2011 Document Revised: 09/02/2011 Document Reviewed: 02/20/2011 [facility] Patient Information 2014 [place], [place].    Drug Allergy Allergic reactions to medicines are common. Some allergic reactions are mild. A delayed type of drug allergy that occurs 1 week or more after exposure to a medicine or vaccine is called serum  sickness. A life-threatening, sudden (acute) allergic reaction that involves the whole body is called anaphylaxis. CAUSES  "True" drug allergies occur when there is an allergic reaction to a medicine. This is caused by overactivity of the immune system. First, the body becomes sensitized. The  immune system is triggered by your first exposure to the medicine. Following this first exposure, future exposure to the same medicine may be life-threatening. Almost any medicine can cause an allergic reaction. Common ones are:  Penicillin.  Sulfonamides (sulfa drugs).  Local anesthetics.  X-ray dyes that contain iodine. SYMPTOMS  Common symptoms of a minor allergic reaction are:  Swelling around the mouth.  An itchy red rash or hives.  Vomiting or diarrhea. Anaphylaxis can cause swelling of the mouth and throat. This makes it difficult to breathe and swallow. Severe reactions can be fatal within seconds, even after exposure to only a trace amount of the drug that causes the reaction. HOME CARE INSTRUCTIONS   If you are unsure of what caused your reaction, keep a diary of foods and medicines used. Include the symptoms that followed. Avoid anything that causes reactions.  You may want to follow up with an allergy specialist after the reaction has cleared in order to be tested to confirm the allergy. It is important to confirm that your reaction is an allergy, not just a side effect to the medicine. If you have a true allergy to a medicine, this may prevent that medicine and related medicines from being given to you when you are very ill.  If you have hives or a rash:  Take medicines as directed by your caregiver.  You may use an over-the-counter antihistamine (diphenhydramine) as needed.  Apply cold compresses to the skin or take baths in cool water. Avoid hot baths or showers.  If you are severely allergic:  Continuous observation after a severe reaction may be needed. Hospitalization is often required.  Wear a medical alert bracelet or necklace stating your allergy.  You and your family must learn how to use an anaphylaxis kit or give an epinephrine injection to temporarily treat an emergency allergic reaction. If you have had a severe reaction, always carry your epinephrine  injection or anaphylaxis kit with you. This can be lifesaving if you have a severe reaction.  Do not drive or perform tasks after treatment until the medicines used to treat your reaction have worn off, or until your caregiver says it is okay. SEEK MEDICAL CARE IF:   You think you had an allergic reaction. Symptoms usually start within 30 minutes after exposure.  Symptoms are getting worse rather than better.  You develop new symptoms.  The symptoms that brought you to your caregiver return. SEEK IMMEDIATE MEDICAL CARE IF:   You have swelling of the mouth, difficulty breathing, or wheezing.  You have a tight feeling in your chest or throat.  You develop hives, swelling, or itching all over your body.  You develop severe vomiting or diarrhea.  You feel faint or pass out. This is an emergency. Use your epinephrine injection or anaphylaxis kit as you have been instructed. Call for emergency medical help. Even if you improve after the injection, you need to be examined at a hospital emergency department. MAKE SURE YOU:   Understand these instructions.  Will watch your condition.  Will get help right away if you are not doing well or get worse. Document Released: 06/10/2005 Document Revised: 09/02/2011 Document Reviewed: 11/14/2010 [facility] Patient Information 2014 [place], [place].

## 2013-11-02 ENCOUNTER — Encounter (HOSPITAL_COMMUNITY): Payer: Self-pay | Admitting: *Deleted

## 2013-11-02 ENCOUNTER — Inpatient Hospital Stay (HOSPITAL_COMMUNITY)
Admission: AD | Admit: 2013-11-02 | Discharge: 2013-11-02 | Disposition: A | Discharge status: Home / Self Care | Payer: Self-pay | Source: Ambulatory Visit | Attending: Obstetrics & Gynecology | Admitting: Obstetrics & Gynecology

## 2013-11-02 DIAGNOSIS — K047 Periapical abscess without sinus: Secondary | ICD-10-CM | POA: Insufficient documentation

## 2013-11-02 DIAGNOSIS — L293 Anogenital pruritus, unspecified: Secondary | ICD-10-CM | POA: Insufficient documentation

## 2013-11-02 DIAGNOSIS — B379 Candidiasis, unspecified: Secondary | ICD-10-CM

## 2013-11-02 DIAGNOSIS — Z87891 Personal history of nicotine dependence: Secondary | ICD-10-CM | POA: Insufficient documentation

## 2013-11-02 LAB — WET PREP, GENITAL
Clue Cells Wet Prep HPF POC: NONE SEEN
TRICH WET PREP: NONE SEEN

## 2013-11-02 LAB — POCT PREGNANCY, URINE: Preg Test, Ur: NEGATIVE

## 2013-11-02 MED ORDER — FLUCONAZOLE 150 MG PO TABS: ORAL_TABLET | ORAL | Status: DC
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
## 2013-11-02 NOTE — MAU Provider Note (Signed)
Attestation of Attending Supervision of Fellow: Evaluation and management procedures were performed by the Fellow under my supervision and collaboration.  I have reviewed the Fellow's note and chart, and I agree with the management and plan.    

## 2013-11-02 NOTE — Discharge Instructions (Signed)
Candida Infection, Adult A candida infection (also called yeast, fungus and Monilia infection) is an overgrowth of yeast that can occur anywhere on the body. A yeast infection commonly occurs in warm, moist body areas. Usually, the infection remains localized but can spread to become a systemic infection. A yeast infection may be a sign of a more severe disease such as diabetes, leukemia, or AIDS. A yeast infection can occur in both men and women. In women, Candida vaginitis is a vaginal infection. It is one of the most common causes of vaginitis. Men usually do not have symptoms or know they have an infection until other problems develop. Men may find out they have a yeast infection because their sex partner has a yeast infection. Uncircumcised men are more likely to get a yeast infection than circumcised men. This is because the uncircumcised glans is not exposed to air and does not remain as dry as that of a circumcised glans. Older adults may develop yeast infections around dentures. CAUSES  Women  Antibiotics.  Steroid medication taken for a long time.  Being overweight (obese).  Diabetes.  Poor immune condition.  Certain serious medical conditions.  Immune suppressive medications for organ transplant patients.  Chemotherapy.  Pregnancy.  Menstration.  Stress and fatigue.  Intravenous drug use.  Oral contraceptives.  Wearing tight-fitting clothes in the crotch area.  Catching it from a sex partner who has a yeast infection.  Spermicide.  Intravenous, urinary, or other catheters. Men  Catching it from a sex partner who has a yeast infection.  Having oral or anal sex with a person who has the infection.  Spermicide.  Diabetes.  Antibiotics.  Poor immune system.  Medications that suppress the immune system.  Intravenous drug use.  Intravenous, urinary, or other catheters. SYMPTOMS  Women  Thick, white vaginal discharge.  Vaginal itching.  Redness and  swelling in and around the vagina.  Irritation of the lips of the vagina and perineum.  Blisters on the vaginal lips and perineum.  Painful sexual intercourse.  Low blood sugar (hypoglycemia).  Painful urination.  Bladder infections.  Intestinal problems such as constipation, indigestion, bad breath, bloating, increase in gas, diarrhea, or loose stools. Men  Men may develop intestinal problems such as constipation, indigestion, bad breath, bloating, increase in gas, diarrhea, or loose stools.  Dry, cracked skin on the penis with itching or discomfort.  Jock itch.  Dry, flaky skin.  Athlete's foot.  Hypoglycemia. DIAGNOSIS  Women  A history and an exam are performed.  The discharge may be examined under a microscope.  A culture may be taken of the discharge. Men  A history and an exam are performed.  Any discharge from the penis or areas of cracked skin will be looked at under the microscope and cultured.  Stool samples may be cultured. TREATMENT  Women  Vaginal antifungal suppositories and creams.  Medicated creams to decrease irritation and itching on the outside of the vagina.  Warm compresses to the perineal area to decrease swelling and discomfort.  Oral antifungal medications.  Medicated vaginal suppositories or cream for repeated or recurrent infections.  Wash and dry the irritation areas before applying the cream.  Eating yogurt with lactobacillus may help with prevention and treatment.  Sometimes painting the vagina with gentian violet solution may help if creams and suppositories do not work. Men  Antifungal creams and oral antifungal medications.  Sometimes treatment must continue for 30 days after the symptoms go away to prevent recurrence. HOME CARE   INSTRUCTIONS  Women  Use cotton underwear and avoid tight-fitting clothing.  Avoid colored, scented toilet paper and deodorant tampons or pads.  Do not douche.  Keep your diabetes  under control.  Finish all the prescribed medications.  Keep your skin clean and dry.  Consume milk or yogurt with lactobacillus active culture regularly. If you get frequent yeast infections and think that is what the infection is, there are over-the-counter medications that you can get. If the infection does not show healing in 3 days, talk to your caregiver.  Tell your sex partner you have a yeast infection. Your partner may need treatment also, especially if your infection does not clear up or recurs. Men  Keep your skin clean and dry.  Keep your diabetes under control.  Finish all prescribed medications.  Tell your sex partner that you have a yeast infection so they can be treated if necessary. SEEK MEDICAL CARE IF:   Your symptoms do not clear up or worsen in one week after treatment.  You have an oral temperature above 102 F (38.9 C).  You have trouble swallowing or eating for a prolonged time.  You develop blisters on and around your vagina.  You develop vaginal bleeding and it is not your menstrual period.  You develop abdominal pain.  You develop intestinal problems as mentioned above.  You get weak or lightheaded.  You have painful or increased urination.  You have pain during sexual intercourse. MAKE SURE YOU:   Understand these instructions.  Will watch your condition.  Will get help right away if you are not doing well or get worse. Document Released: 07/18/2004 Document Revised: 09/02/2011 Document Reviewed: 10/30/2009 [employer] Patient Information 2014 [employer].  

## 2013-11-02 NOTE — MAU Provider Note (Signed)
History     CSN: [id number]  Arrival date and time: 11/02/13 1028   None     Chief Complaint  Patient presents with  . Back Pain   HPI  28 yo [id number] who presents for vaginal itching.   Pt says that she has been itching for the last 2 weeks without an increase in discharge. Took a one day monistat treatment and then started on her period. Itching returned 3 days later. Has worsened now since being on abx for a dental abscess. Has 4 days left.     No fevers, nausea, vomiting, diarrhea, constipation.     Past Medical History  Diagnosis Date  . Menorrhagia   . Anemia   . Anxiety     Past Surgical History  Procedure Laterality Date  . Cholecystectomy    . Tubal ligation      Family History  Problem Relation Age of Onset  . Diabetes Father     History  Substance Use Topics  . Smoking status: Former Smoker    Quit date: 05/13/2009  . Smokeless tobacco: Never Used  . Alcohol Use: 0.6 oz/week    1 Glasses of wine per week     Comment: occaional    Allergies:  Allergies  Allergen Reactions  . Shrimp [Shellfish Allergy] Anaphylaxis, Hives and Swelling  . Penicillins Itching and Swelling    Swelling -tongue    Prescriptions prior to admission  Medication Sig Dispense Refill  . acetaminophen (TYLENOL) 500 MG tablet Take 1,000 mg by mouth daily as needed for moderate pain.      . clindamycin (CLEOCIN) 150 MG capsule Take 1 capsule (150 mg total) by mouth 4 (four) times daily.  28 capsule  0  . methocarbamol (ROBAXIN) 750 MG tablet Take 750 mg by mouth 4 (four) times daily.      . naproxen (NAPROSYN) 500 MG tablet Take 1 tablet (500 mg total) by mouth 2 (two) times daily as needed.  30 tablet  0  . predniSONE (DELTASONE) 20 MG tablet Take 2 tablets (40 mg total) by mouth daily.  12 tablet  0  . Prenatal Vit-Fe Fumarate-FA (PRENATAL MULTIVITAMIN) TABS tablet Take 1 tablet by mouth daily at 12 noon.      . ranitidine (ZANTAC) 150 MG tablet Take 150 mg by mouth  daily.        Review of Systems  Constitutional: Negative for fever and chills.  Respiratory: Negative for cough.   Cardiovascular: Negative for chest pain.  Gastrointestinal: Negative for nausea and vomiting.  Genitourinary: Negative for dysuria, urgency, frequency and hematuria.  Neurological: Negative for headaches.  Endo/Heme/Allergies: Does not bruise/bleed easily.  Psychiatric/Behavioral: Negative for depression.   Physical Exam   Blood pressure 110/66, pulse 73, temperature 98.5 F (36.9 C), temperature source Oral, resp. rate 16, height 5\' 4"  (1.626 m), weight 85.458 kg (188 lb 6.4 oz), last menstrual period 10/22/2013, SpO2 100.00%.  Physical Exam  Constitutional: She appears well-developed and well-nourished.  HENT:  Head: Normocephalic.  Neck: Neck supple.  Cardiovascular: Normal rate and regular rhythm.   Respiratory: Effort normal and breath sounds normal.  GI: Bowel sounds are normal. She exhibits no distension. There is no tenderness. There is no rebound.  Genitourinary: Uterus normal. Cervix exhibits discharge (watery and curdish discharge). Right adnexum displays no mass and no tenderness. Left adnexum displays no mass and no tenderness. There is erythema (and irritation) around the vagina.    MAU Course  Procedures  MDM Wet prep  Cultures    Assessment and Plan  28 yo [id number] who presents for vaginal itching.    - exam most consistent with yeast..  - rx for diflucan x2. One for now and one for in 5 days.   - f/u if no improvement.    Georg Ang L Rika Daughdrill 11/02/2013, 11:46 AM

## 2013-11-02 NOTE — Progress Notes (Signed)
Written and verbal d/c instructions given and understanding voiced. 

## 2013-11-03 ENCOUNTER — Telehealth: Payer: Self-pay

## 2013-11-03 DIAGNOSIS — A749 Chlamydial infection, unspecified: Secondary | ICD-10-CM

## 2013-11-03 LAB — GC/CHLAMYDIA PROBE AMP
CT Probe RNA: POSITIVE — AB
GC PROBE AMP APTIMA: NEGATIVE

## 2013-11-03 MED ORDER — AZITHROMYCIN 500 MG PO TABS: 1000.0000 mg | ORAL_TABLET | Freq: Every day | ORAL | Status: DC

## 2013-11-03 NOTE — Telephone Encounter (Signed)
Message copied by Geanie Logan on Wed Nov 03, 2013 11:44 AM ------      Message from: Tarry Kos      Created: Wed Nov 03, 2013  8:53 AM       Telephone call to patient regarding positive chlamydia culture, patient notified.  Patient has not been treated and will need Rx called in per protocol to [facility].  Instructed patient to notify her partner for treatment.  Report faxed to health department. ------

## 2013-11-03 NOTE — Telephone Encounter (Signed)
Zithromax 1gm PO e-prescribed. Message left on patient's cell phone informing her medication is ready at pharmacy, call clinic with questions.

## 2013-11-10 ENCOUNTER — Telehealth (HOSPITAL_BASED_OUTPATIENT_CLINIC_OR_DEPARTMENT_OTHER): Payer: Self-pay

## 2013-11-10 NOTE — Telephone Encounter (Signed)
Pt wanted copy of MD records for insurance company given contact info for medical records.

## 2013-11-28 ENCOUNTER — Encounter (HOSPITAL_COMMUNITY): Payer: Self-pay | Admitting: Emergency Medicine

## 2013-11-28 ENCOUNTER — Emergency Department (HOSPITAL_COMMUNITY)
Admission: EM | Admit: 2013-11-28 | Discharge: 2013-11-28 | Disposition: A | Discharge status: Home / Self Care | Payer: Self-pay | Attending: Emergency Medicine | Admitting: Emergency Medicine

## 2013-11-28 DIAGNOSIS — M549 Dorsalgia, unspecified: Secondary | ICD-10-CM | POA: Insufficient documentation

## 2013-11-28 DIAGNOSIS — Z862 Personal history of diseases of the blood and blood-forming organs and certain disorders involving the immune mechanism: Secondary | ICD-10-CM | POA: Insufficient documentation

## 2013-11-28 DIAGNOSIS — F411 Generalized anxiety disorder: Secondary | ICD-10-CM | POA: Insufficient documentation

## 2013-11-28 DIAGNOSIS — N898 Other specified noninflammatory disorders of vagina: Secondary | ICD-10-CM | POA: Insufficient documentation

## 2013-11-28 DIAGNOSIS — Z3202 Encounter for pregnancy test, result negative: Secondary | ICD-10-CM | POA: Insufficient documentation

## 2013-11-28 DIAGNOSIS — IMO0002 Reserved for concepts with insufficient information to code with codable children: Secondary | ICD-10-CM | POA: Insufficient documentation

## 2013-11-28 DIAGNOSIS — Z880 Allergy status to penicillin: Secondary | ICD-10-CM | POA: Insufficient documentation

## 2013-11-28 DIAGNOSIS — Z792 Long term (current) use of antibiotics: Secondary | ICD-10-CM | POA: Insufficient documentation

## 2013-11-28 DIAGNOSIS — Z87891 Personal history of nicotine dependence: Secondary | ICD-10-CM | POA: Insufficient documentation

## 2013-11-28 DIAGNOSIS — Z79899 Other long term (current) drug therapy: Secondary | ICD-10-CM | POA: Insufficient documentation

## 2013-11-28 LAB — URINALYSIS, ROUTINE W REFLEX MICROSCOPIC
BILIRUBIN URINE: NEGATIVE
GLUCOSE, UA: NEGATIVE mg/dL
Hgb urine dipstick: NEGATIVE
KETONES UR: NEGATIVE mg/dL
NITRITE: NEGATIVE
PH: 6.5 (ref 5.0–8.0)
PROTEIN: NEGATIVE mg/dL
Specific Gravity, Urine: 1.024 (ref 1.005–1.030)
Urobilinogen, UA: 0.2 mg/dL (ref 0.0–1.0)

## 2013-11-28 LAB — WET PREP, GENITAL
Clue Cells Wet Prep HPF POC: NONE SEEN
Trich, Wet Prep: NONE SEEN
YEAST WET PREP: NONE SEEN

## 2013-11-28 LAB — URINE MICROSCOPIC-ADD ON

## 2013-11-28 LAB — PREGNANCY, URINE: Preg Test, Ur: NEGATIVE

## 2013-11-28 LAB — HIV ANTIBODY (ROUTINE TESTING W REFLEX): HIV 1&2 Ab, 4th Generation: NONREACTIVE

## 2013-11-28 LAB — RPR

## 2013-11-28 MED ORDER — AZITHROMYCIN 250 MG PO TABS
1000.0000 mg | ORAL_TABLET | Freq: Once | ORAL | Status: AC
  Administered 2013-11-28: 1000 mg via ORAL
  Filled 2013-11-28: qty 4

## 2013-11-28 MED ORDER — CEFTRIAXONE SODIUM 250 MG IJ SOLR
250.0000 mg | Freq: Once | INTRAMUSCULAR | Status: AC
  Administered 2013-11-28: 250 mg via INTRAMUSCULAR
  Filled 2013-11-28: qty 250

## 2013-11-28 MED ORDER — CYCLOBENZAPRINE HCL 10 MG PO TABS: 10.0000 mg | ORAL_TABLET | Freq: Two times a day (BID) | ORAL | Status: DC | PRN

## 2013-11-28 NOTE — Discharge Instructions (Signed)
Read the information below.  Use the prescribed medication as directed.  Please discuss all new medications with your pharmacist.  You may return to the Emergency Department at any time for worsening condition or any new symptoms that concern you.    If you develop fevers, loss of control of bowel or bladder, weakness or numbness in your legs, or are unable to walk, return to the ER for a recheck.  ° °Back Pain, Adult °Low back pain is very common. About 1 in 5 people have back pain. The cause of low back pain is rarely dangerous. The pain often gets better over time. About half of people with a sudden onset of back pain feel better in just 2 weeks. About 8 in 10 people feel better by 6 weeks.  °CAUSES °Some common causes of back pain include: °· Strain of the muscles or ligaments supporting the spine. °· Wear and tear (degeneration) of the spinal discs. °· Arthritis. °· Direct injury to the back. °DIAGNOSIS °Most of the time, the direct cause of low back pain is not known. However, back pain can be treated effectively even when the exact cause of the pain is unknown. Answering your caregiver's questions about your overall health and symptoms is one of the most accurate ways to make sure the cause of your pain is not dangerous. If your caregiver needs more information, he or she may order lab work or imaging tests (X-rays or MRIs). However, even if imaging tests show changes in your back, this usually does not require surgery. °HOME CARE INSTRUCTIONS °For many people, back pain returns. Since low back pain is rarely dangerous, it is often a condition that people can learn to manage on their own.  °· Remain active. It is stressful on the back to sit or stand in one place. Do not sit, drive, or stand in one place for more than 30 minutes at a time. Take short walks on level surfaces as soon as pain allows. Try to increase the length of time you walk each day. °· Do not stay in bed. Resting more than 1 or 2 days can  delay your recovery. °· Do not avoid exercise or work. Your body is made to move. It is not dangerous to be active, even though your back may hurt. Your back will likely heal faster if you return to being active before your pain is gone. °· Pay attention to your body when you  bend and lift. Many people have less discomfort when lifting if they bend their knees, keep the load close to their bodies, and avoid twisting. Often, the most comfortable positions are those that put less stress on your recovering back. °· Find a comfortable position to sleep. Use a firm mattress and lie on your side with your knees slightly bent. If you lie on your back, put a pillow under your knees. °· Only take over-the-counter or prescription medicines as directed by your caregiver. Over-the-counter medicines to reduce pain and inflammation are often the most helpful. Your caregiver may prescribe muscle relaxant drugs. These medicines help dull your pain so you can more quickly return to your normal activities and healthy exercise. °· Put ice on the injured area. °· Put ice in a plastic bag. °· Place a towel between your skin and the bag. °· Leave the ice on for 15-20 minutes, 03-04 times a day for the first 2 to 3 days. After that, ice and heat may be alternated to reduce pain and spasms. °· Ask your caregiver about trying back exercises and gentle massage. This may be of some   benefit.  Avoid feeling anxious or stressed.Stress increases muscle tension and can worsen back pain.It is important to recognize when you are anxious or stressed and learn ways to manage it.Exercise is a great option. SEEK MEDICAL CARE IF:  You have pain that is not relieved with rest or medicine.  You have pain that does not improve in 1 week.  You have new symptoms.  You are generally not feeling well. SEEK IMMEDIATE MEDICAL CARE IF:   You have pain that radiates from your back into your legs.  You develop new bowel or bladder control  problems.  You have unusual weakness or numbness in your arms or legs.  You develop nausea or vomiting.  You develop abdominal pain.  You feel faint. Document Released: 06/10/2005 Document Revised: 12/10/2011 Document Reviewed: 10/29/2010 [facility] Patient Information 2014 [place], [place].  Back Exercises Back exercises help treat and prevent back injuries. The goal of back exercises is to increase the strength of your abdominal and back muscles and the flexibility of your back. These exercises should be started when you no longer have back pain. Back exercises include:  Pelvic Tilt. Lie on your back with your knees bent. Tilt your pelvis until the lower part of your back is against the floor. Hold this position 5 to 10 sec and repeat 5 to 10 times.  Knee to Chest. Pull first 1 knee up against your chest and hold for 20 to 30 seconds, repeat this with the other knee, and then both knees. This may be done with the other leg straight or bent, whichever feels better.  Sit-Ups or Curl-Ups. Bend your knees 90 degrees. Start with tilting your pelvis, and do a partial, slow sit-up, lifting your trunk only 30 to 45 degrees off the floor. Take at least 2 to 3 seconds for each sit-up. Do not do sit-ups with your knees out straight. If partial sit-ups are difficult, simply do the above but with only tightening your abdominal muscles and holding it as directed.  Hip-Lift. Lie on your back with your knees flexed 90 degrees. Push down with your feet and shoulders as you raise your hips a couple inches off the floor; hold for 10 seconds, repeat 5 to 10 times.  Back arches. Lie on your stomach, propping yourself up on bent elbows. Slowly press on your hands, causing an arch in your low back. Repeat 3 to 5 times. Any initial stiffness and discomfort should lessen with repetition over time.  Shoulder-Lifts. Lie face down with arms beside your body. Keep hips and torso pressed to floor as you slowly lift your  head and shoulders off the floor. Do not overdo your exercises, especially in the beginning. Exercises may cause you some mild back discomfort which lasts for a few minutes; however, if the pain is more severe, or lasts for more than 15 minutes, do not continue exercises until you see your caregiver. Improvement with exercise therapy for back problems is slow.  See your caregivers for assistance with developing a proper back exercise program. Document Released: 07/18/2004 Document Revised: 09/02/2011 Document Reviewed: 04/11/2011 [facility] Patient Information 2014 [place].
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
## 2013-11-29 LAB — GC/CHLAMYDIA PROBE AMP
CT PROBE, AMP APTIMA: NEGATIVE
GC PROBE AMP APTIMA: NEGATIVE

## 2013-11-29 NOTE — ED Provider Notes (Signed)
History/physical exam/procedure(s) were performed by non-physician practitioner and as supervising physician I was immediately available for consultation/collaboration. I have reviewed all notes and am in agreement with care and plan.   Shaune Pollack, MD 11/29/13 [phone]

## 2014-01-13 ENCOUNTER — Encounter (HOSPITAL_COMMUNITY): Payer: Self-pay | Admitting: Emergency Medicine

## 2014-01-13 ENCOUNTER — Emergency Department (INDEPENDENT_AMBULATORY_CARE_PROVIDER_SITE_OTHER)
Admission: EM | Admit: 2014-01-13 | Discharge: 2014-01-13 | Disposition: A | Discharge status: Home / Self Care | Payer: BC Managed Care – PPO | Source: Home / Self Care | Attending: Family Medicine | Admitting: Family Medicine

## 2014-01-13 DIAGNOSIS — M549 Dorsalgia, unspecified: Secondary | ICD-10-CM

## 2014-01-13 DIAGNOSIS — F419 Anxiety disorder, unspecified: Secondary | ICD-10-CM

## 2014-01-13 DIAGNOSIS — K029 Dental caries, unspecified: Secondary | ICD-10-CM

## 2014-01-13 DIAGNOSIS — G8929 Other chronic pain: Secondary | ICD-10-CM

## 2014-01-13 DIAGNOSIS — F411 Generalized anxiety disorder: Secondary | ICD-10-CM

## 2014-01-13 MED ORDER — LIDOCAINE VISCOUS 2 % MT SOLN: OROMUCOSAL | Status: DC

## 2014-01-13 MED ORDER — CLINDAMYCIN HCL 300 MG PO CAPS: 300.0000 mg | ORAL_CAPSULE | Freq: Four times a day (QID) | ORAL | Status: DC

## 2014-01-13 MED ORDER — DICLOFENAC SODIUM 75 MG PO TBEC: 75.0000 mg | DELAYED_RELEASE_TABLET | Freq: Two times a day (BID) | ORAL | Status: DC
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
## 2014-01-13 NOTE — Discharge Instructions (Signed)
Chronic Back Pain  When back pain lasts longer than 3 months, it is called chronic back pain.People with chronic back pain often go through certain periods that are more intense (flare-ups).  CAUSES Chronic back pain can be caused by wear and tear (degeneration) on different structures in your back. These structures include:  The bones of your spine (vertebrae) and the joints surrounding your spinal cord and nerve roots (facets).  The strong, fibrous tissues that connect your vertebrae (ligaments). Degeneration of these structures may result in pressure on your nerves. This can lead to constant pain. HOME CARE INSTRUCTIONS  Avoid bending, heavy lifting, prolonged sitting, and activities which make the problem worse.  Take brief periods of rest throughout the day to reduce your pain. Lying down or standing usually is better than sitting while you are resting.  Take over-the-counter or prescription medicines only as directed by your caregiver. SEEK IMMEDIATE MEDICAL CARE IF:   You have weakness or numbness in one of your legs or feet.  You have trouble controlling your bladder or bowels.  You have nausea, vomiting, abdominal pain, shortness of breath, or fainting. Document Released: 07/18/2004 Document Revised: 09/02/2011 Document Reviewed: 05/25/2011 [facility] Patient Information 2015 [place], [place]. This information is not intended to replace advice given to you by your health care provider. Make sure you discuss any questions you have with your health care provider.  Dental Care and Dentist Visits Dental care supports good overall health. Regular dental visits can also help you avoid dental pain, bleeding, infection, and other more serious health problems in the future. It is important to keep the mouth healthy because diseases in the teeth, gums, and other oral tissues can spread to other areas of the body. Some problems, such as diabetes, heart disease, and pre-term labor have been  associated with poor oral health.  See your dentist every 6 months. If you experience emergency problems such as a toothache or broken tooth, go to the dentist right away. If you see your dentist regularly, you may catch problems early. It is easier to be treated for problems in the early stages.  WHAT TO EXPECT AT A DENTIST VISIT  Your dentist will look for many common oral health problems and recommend proper treatment. At your regular dental visit, you can expect:  Gentle cleaning of the teeth and gums. This includes scraping and polishing. This helps to remove the sticky substance around the teeth and gums (plaque). Plaque forms in the mouth shortly after eating. Over time, plaque hardens on the teeth as tartar. If tartar is not removed regularly, it can cause problems. Cleaning also helps remove stains.  Periodic X-rays. These pictures of the teeth and supporting bone will help your dentist assess the health of your teeth.  Periodic fluoride treatments. Fluoride is a natural mineral shown to help strengthen teeth. Fluoride treatmentinvolves applying a fluoride gel or varnish to the teeth. It is most commonly done in children.  Examination of the mouth, tongue, jaws, teeth, and gums to look for any oral health problems, such as:  Cavities (dental caries). This is decay on the tooth caused by plaque, sugar, and acid in the mouth. It is best to catch a cavity when it is small.  Inflammation of the gums caused by plaque buildup (gingivitis).  Problems with the mouth or malformed or misaligned teeth.  Oral cancer or other diseases of the soft tissues or jaws. KEEP YOUR TEETH AND GUMS HEALTHY For healthy teeth and gums, follow these general  guidelines as well as your dentist's specific advice:  Have your teeth professionally cleaned at the dentist every 6 months.  Brush twice daily with a fluoride toothpaste.  Floss your teeth daily.  Ask your dentist if you need fluoride supplements,  treatments, or fluoride toothpaste.  Eat a healthy diet. Reduce foods and drinks with added sugar.  Avoid smoking. TREATMENT FOR ORAL HEALTH PROBLEMS If you have oral health problems, treatment varies depending on the conditions present in your teeth and gums.  Your caregiver will most likely recommend good oral hygiene at each visit.  For cavities, gingivitis, or other oral health disease, your caregiver will perform a procedure to treat the problem. This is typically done at a separate appointment. Sometimes your caregiver will refer you to another dental specialist for specific tooth problems or for surgery. SEEK IMMEDIATE DENTAL CARE IF:  You have pain, bleeding, or soreness in the gum, tooth, jaw, or mouth area.  A permanent tooth becomes loose or separated from the gum socket.  You experience a blow or injury to the mouth or jaw area. Document Released: 02/20/2011 Document Revised: 09/02/2011 Document Reviewed: 02/20/2011 [facility] Patient Information 2015 [place], [place]. This information is not intended to replace advice given to you by your health care provider. Make sure you discuss any questions you have with your health care provider.  Dental Caries Dental caries (also called tooth decay) is the most common oral disease. It can occur at any age but is more common in children and young adults.  HOW DENTAL CARIES DEVELOPS  The process of decay begins when bacteria and foods (particularly sugars and starches) combine in your mouth to produce plaque. Plaque is a substance that sticks to the hard, outer surface of a tooth (enamel). The bacteria in plaque produce acids that attack enamel. These acids may also attack the root surface of a tooth (cementum) if it is exposed. Repeated attacks dissolve these surfaces and create holes in the tooth (cavities). If left untreated, the acids destroy the other layers of the tooth.  RISK FACTORS  Frequent sipping of sugary beverages.    Frequent snacking on sugary and starchy foods, especially those that easily get stuck in the teeth.   Poor oral hygiene.   Dry mouth.   Substance abuse such as methamphetamine abuse.   Broken or poor-fitting dental restorations.   Eating disorders.   Gastroesophageal reflux disease (GERD).   Certain radiation treatments to the head and neck. SYMPTOMS In the early stages of dental caries, symptoms are seldom present. Sometimes white, chalky areas may be seen on the enamel or other tooth layers. In later stages, symptoms may include:  Pits and holes on the enamel.  Toothache after sweet, hot, or cold foods or drinks are consumed.  Pain around the tooth.  Swelling around the tooth. DIAGNOSIS  Most of the time, dental caries is detected during a regular dental checkup. A diagnosis is made after a thorough medical and dental history is taken and the surfaces of your teeth are checked for signs of dental caries. Sometimes special instruments, such as lasers, are used to check for dental caries. Dental X-ray exams may be taken so that areas not visible to the eye (such as between the contact areas of the teeth) can be checked for cavities.  TREATMENT  If dental caries is in its early stages, it may be reversed with a fluoride treatment or an application of a remineralizing agent at the dental office. Thorough brushing and flossing  at home is needed to aid these treatments. If it is in its later stages, treatment depends on the location and extent of tooth destruction:   If a small area of the tooth has been destroyed, the destroyed area will be removed and cavities will be filled with a material such as gold, silver amalgam, or composite resin.   If a large area of the tooth has been destroyed, the destroyed area will be removed and a cap (crown) will be fitted over the remaining tooth structure.   If the center part of the tooth (pulp) is affected, a procedure called a root  canal will be needed before a filling or crown can be placed.   If most of the tooth has been destroyed, the tooth may need to be pulled (extracted). HOME CARE INSTRUCTIONS You can prevent, stop, or reverse dental caries at home by practicing good oral hygiene. Good oral hygiene includes:  Thoroughly cleaning your teeth at least twice a day with a toothbrush and dental floss.   Using a fluoride toothpaste. A fluoride mouth rinse may also be used if recommended by your dentist or health care provider.   Restricting the amount of sugary and starchy foods and sugary liquids you consume.   Avoiding frequent snacking on these foods and sipping of these liquids.   Keeping regular visits with a dentist for checkups and cleanings. PREVENTION   Practice good oral hygiene.  Consider a dental sealant. A dental sealant is a coating material that is applied by your dentist to the pits and grooves of teeth. The sealant prevents food from being trapped in them. It may protect the teeth for several years.  Ask about fluoride supplements if you live in a community without fluorinated water or with water that has a low fluoride content. Use fluoride supplements as directed by your dentist or health care provider.  Allow fluoride varnish applications to teeth if directed by your dentist or health care provider. Document Released: 03/02/2002 Document Revised: 10/25/2013 Document Reviewed: 06/12/2012 [facility] Patient Information 2015 [place], [place]. This information is not intended to replace advice given to you by your health care provider. Make sure you discuss any questions you have with your health care provider.

## 2014-01-13 NOTE — ED Provider Notes (Signed)
Medical screening examination/treatment/procedure(s) were performed by a resident physician or non-physician practitioner and as the supervising physician I was immediately available for consultation/collaboration.  Linna Darner, MD Family Medicine   Waldemar Dickens, MD 01/13/14 [phone]

## 2014-01-13 NOTE — ED Provider Notes (Signed)
CSN: [id number]     Arrival date & time 01/13/14  1221 History   First MD Initiated Contact with Patient 01/13/14 1314     Chief Complaint  Patient presents with  . Back Pain  . Abscess  . Anxiety   (Consider location/radiation/quality/duration/timing/severity/associated sxs/prior Treatment) HPI Comments: Patient presents with 3 complaints. First she requests medication for chronic back pain. Next she requests treatment for chronic anxiety. And lastly, she reports acute exacerbation of chronic dental pain and recurrent dental abscess. States she has not contacted her dentist and she is not scheduled to see her PCP until Aug. 2015.   Patient is a 28 y.o. female presenting with abscess and anxiety. The history is provided by the patient.  Abscess Anxiety    Past Medical History  Diagnosis Date  . Menorrhagia   . Anemia   . Anxiety    Past Surgical History  Procedure Laterality Date  . Cholecystectomy    . Tubal ligation     Family History  Problem Relation Age of Onset  . Diabetes Father    History  Substance Use Topics  . Smoking status: Former Smoker    Quit date: 05/13/2009  . Smokeless tobacco: Never Used  . Alcohol Use: 0.6 oz/week    1 Glasses of wine per week     Comment: occaional   OB History   Grav Para Term Preterm Abortions TAB SAB Ect Mult Living   5 4 2 2 1  1   4      Review of Systems  Constitutional: Negative.   HENT: Negative.   Eyes: Negative.   Respiratory: Negative.   Cardiovascular: Negative.   Gastrointestinal: Negative.   Genitourinary: Negative.   Musculoskeletal: Positive for back pain.  Skin: Negative.   Allergic/Immunologic: Negative for immunocompromised state.  Neurological: Negative.   Psychiatric/Behavioral: Negative for suicidal ideas, hallucinations, behavioral problems, confusion, self-injury, dysphoric mood, decreased concentration and agitation. The patient is nervous/anxious. The patient is not hyperactive.    Denies HI    Allergies  Shrimp and Penicillins  Home Medications   Prior to Admission medications   Medication Sig Start Date End Date Taking? Authorizing Provider  acetaminophen (TYLENOL) 500 MG tablet Take 1,000 mg by mouth daily as needed for moderate pain.    Historical Provider, MD  clindamycin (CLEOCIN) 300 MG capsule Take 1 capsule (300 mg total) by mouth 4 (four) times daily. X 7 days 01/13/14   Lahoma Rocker, PA  cyclobenzaprine (FLEXERIL) 10 MG tablet Take 1 tablet (10 mg total) by mouth 2 (two) times daily as needed for muscle spasms (or back pain). 11/28/13   Clayton Bibles, PA-C  diclofenac (VOLTAREN) 75 MG EC tablet Take 1 tablet (75 mg total) by mouth 2 (two) times daily. As needed for pain 01/13/14   Annett Gula Presson, PA  lidocaine (XYLOCAINE) 2 % solution Apply to affected area of pain using cotton swab every 3 hours as needed for pain 01/13/14   Annett Gula Presson, PA  methocarbamol (ROBAXIN) 750 MG tablet Take 750 mg by mouth 4 (four) times daily as needed for muscle spasms.     Historical Provider, MD  naproxen (NAPROSYN) 500 MG tablet Take 500 mg by mouth 2 (two) times daily as needed (pain). 09/17/13   Jarrett Soho Muthersbaugh, PA-C  Prenatal Vit-Fe Fumarate-FA (PRENATAL MULTIVITAMIN) TABS tablet Take 1 tablet by mouth daily at 12 noon.    Historical Provider, MD  ranitidine (ZANTAC) 150 MG tablet Take 150 mg by mouth daily.  Historical Provider, MD   BP 106/64  Pulse 78  Temp(Src) 98.2 F (36.8 C) (Oral)  Resp 16  SpO2 100%  LMP 12/27/2013 Physical Exam  Nursing note and vitals reviewed. Constitutional: She is oriented to person, place, and time. She appears well-developed and well-nourished. No distress.  HENT:  Head: Normocephalic and atraumatic.  Mouth/Throat: Uvula is midline, oropharynx is clear and moist and mucous membranes are normal. No oral lesions. No trismus in the jaw. Dental caries present. No dental abscesses or uvula swelling.    Eyes:  Conjunctivae are normal.  Neck: Normal range of motion. Neck supple.  Cardiovascular: Normal rate.   Pulmonary/Chest: Effort normal.  Musculoskeletal: Normal range of motion.  Lymphadenopathy:    She has no cervical adenopathy.  Neurological: She is alert and oriented to person, place, and time.  Skin: Skin is warm and dry. No rash noted. No erythema.  Psychiatric: She has a normal mood and affect. Her behavior is normal.    ED Course  Procedures (including critical care time) Labs Review Labs Reviewed - No data to display  Imaging Review No results found.   MDM   1. Chronic back pain   2. Chronic anxiety   3. Dental cavity    Advised patient she would need to discuss management of chronic pain and anxiety with her PCP. Exam without evidence of acute neurological deficit or emergent mental health need. Patient advised that clinically she does not appear to have dental abscess, however, patient feels certain that she does. Therefore, will provide patient with Rx for oral clindamycin (PCN allergy) and advise dental follow up. Also provided patient printed information regarding 6 low cost dental options in area and upcoming free dental clinic.   [place], [place] 01/13/14 1432

## 2014-02-28 ENCOUNTER — Encounter (HOSPITAL_COMMUNITY): Payer: Self-pay | Admitting: Emergency Medicine

## 2014-02-28 ENCOUNTER — Emergency Department (HOSPITAL_COMMUNITY)
Admission: EM | Admit: 2014-02-28 | Discharge: 2014-02-28 | Disposition: A | Discharge status: Home / Self Care | Payer: BC Managed Care – PPO | Attending: Emergency Medicine | Admitting: Emergency Medicine

## 2014-02-28 DIAGNOSIS — L97909 Non-pressure chronic ulcer of unspecified part of unspecified lower leg with unspecified severity: Principal | ICD-10-CM | POA: Insufficient documentation

## 2014-02-28 DIAGNOSIS — Z792 Long term (current) use of antibiotics: Secondary | ICD-10-CM | POA: Insufficient documentation

## 2014-02-28 DIAGNOSIS — I8390 Asymptomatic varicose veins of unspecified lower extremity: Secondary | ICD-10-CM

## 2014-02-28 DIAGNOSIS — Z87891 Personal history of nicotine dependence: Secondary | ICD-10-CM | POA: Insufficient documentation

## 2014-02-28 DIAGNOSIS — M79609 Pain in unspecified limb: Secondary | ICD-10-CM | POA: Insufficient documentation

## 2014-02-28 DIAGNOSIS — Z8742 Personal history of other diseases of the female genital tract: Secondary | ICD-10-CM | POA: Insufficient documentation

## 2014-02-28 DIAGNOSIS — K0889 Other specified disorders of teeth and supporting structures: Secondary | ICD-10-CM

## 2014-02-28 DIAGNOSIS — Z79899 Other long term (current) drug therapy: Secondary | ICD-10-CM | POA: Insufficient documentation

## 2014-02-28 DIAGNOSIS — K089 Disorder of teeth and supporting structures, unspecified: Secondary | ICD-10-CM | POA: Insufficient documentation

## 2014-02-28 DIAGNOSIS — Z8659 Personal history of other mental and behavioral disorders: Secondary | ICD-10-CM | POA: Insufficient documentation

## 2014-02-28 DIAGNOSIS — Z880 Allergy status to penicillin: Secondary | ICD-10-CM | POA: Insufficient documentation

## 2014-02-28 DIAGNOSIS — D649 Anemia, unspecified: Secondary | ICD-10-CM | POA: Insufficient documentation

## 2014-02-28 DIAGNOSIS — I83009 Varicose veins of unspecified lower extremity with ulcer of unspecified site: Secondary | ICD-10-CM | POA: Insufficient documentation

## 2014-02-28 MED ORDER — CLINDAMYCIN HCL 150 MG PO CAPS: 300.0000 mg | ORAL_CAPSULE | Freq: Three times a day (TID) | ORAL | Status: DC

## 2014-02-28 MED ORDER — HYDROCODONE-ACETAMINOPHEN 5-325 MG PO TABS: 1.0000 | ORAL_TABLET | Freq: Four times a day (QID) | ORAL | Status: DC | PRN

## 2014-02-28 NOTE — ED Notes (Signed)
Declined W/C at D/C and was escorted to lobby by RN. 

## 2014-02-28 NOTE — ED Provider Notes (Signed)
CSN: [id number]     Arrival date & time 02/28/14  1249 History  This chart was scribed for non-physician practitioner Montine Circle, PA-C working with Johnna Acosta, MD by Ludger Nutting, ED Scribe. This patient was seen in room TR09C/TR09C and the patient's care was started at 3:19 PM.    Chief Complaint  Patient presents with  . Leg Pain   The history is provided by the patient. No language interpreter was used.    HPI Comments: Breanna Miranda is a 28 y.o. female who presents to the Emergency Department complaining of constant, unchanged left posterior calf pain that radiates up to the thigh beginning yesterday. Patient states she noticed associated bruising to the posterior left thigh as well. She denies recent injuries or trauma to the affected area. She denies weakness, numbness.   She also complains of right lower dental pain that has been ongoing for the past few months. She reports associated gum swelling. She has tried OTC medication without relief. She denies fever, chills.   Past Medical History  Diagnosis Date  . Menorrhagia   . Anemia   . Anxiety    Past Surgical History  Procedure Laterality Date  . Cholecystectomy    . Tubal ligation     Family History  Problem Relation Age of Onset  . Diabetes Father    History  Substance Use Topics  . Smoking status: Former Smoker    Quit date: 05/13/2009  . Smokeless tobacco: Never Used  . Alcohol Use: 0.6 oz/week    1 Glasses of wine per week     Comment: occaional   OB History   Grav Para Term Preterm Abortions TAB SAB Ect Mult Living   5 4 2 2 1  1   4      Review of Systems  Constitutional: Negative for fever and chills.  HENT: Positive for dental problem.   Musculoskeletal: Positive for myalgias.  Skin: Positive for color change (bruising).  Neurological: Negative for weakness and numbness.      Allergies  Shrimp and Penicillins  Home Medications   Prior to Admission medications   Medication Sig  Start Date End Date Taking? Authorizing Provider  acetaminophen (TYLENOL) 500 MG tablet Take 1,000 mg by mouth daily as needed for moderate pain.    Historical Provider, MD  clindamycin (CLEOCIN) 300 MG capsule Take 1 capsule (300 mg total) by mouth 4 (four) times daily. X 7 days 01/13/14   Lutricia Feil, PA  cyclobenzaprine (FLEXERIL) 10 MG tablet Take 1 tablet (10 mg total) by mouth 2 (two) times daily as needed for muscle spasms (or back pain). 11/28/13   Clayton Bibles, PA-C  diclofenac (VOLTAREN) 75 MG EC tablet Take 1 tablet (75 mg total) by mouth 2 (two) times daily. As needed for pain 01/13/14   Lutricia Feil, PA  lidocaine (XYLOCAINE) 2 % solution Apply to affected area of pain using cotton swab every 3 hours as needed for pain 01/13/14   Lutricia Feil, PA  methocarbamol (ROBAXIN) 750 MG tablet Take 750 mg by mouth 4 (four) times daily as needed for muscle spasms.     Historical Provider, MD  naproxen (NAPROSYN) 500 MG tablet Take 500 mg by mouth 2 (two) times daily as needed (pain). 09/17/13   Jarrett Soho Muthersbaugh, PA-C  Prenatal Vit-Fe Fumarate-FA (PRENATAL MULTIVITAMIN) TABS tablet Take 1 tablet by mouth daily at 12 noon.    Historical Provider, MD  ranitidine (ZANTAC) 150 MG  tablet Take 150 mg by mouth daily.    Historical Provider, MD   BP 110/62  Pulse 88  Temp(Src) 97.9 F (36.6 C) (Oral)  Resp 12  Ht 5\' 4"  (1.626 m)  Wt 185 lb (83.915 kg)  BMI 31.74 kg/m2  SpO2 100%  LMP 02/23/2014 Physical Exam  Nursing note and vitals reviewed. Constitutional: She is oriented to person, place, and time. She appears well-developed and well-nourished.  HENT:  Head: Normocephalic and atraumatic.  Mouth/Throat:    Poor dentition throughout.  Affected tooth as diagrammed.  No signs of peritonsillar or tonsillar abscess.  No signs of gingival abscess. Oropharynx is clear and without exudates.  Uvula is midline.  Airway is intact. No signs of Ludwig's angina with palpation of  oral and sublingual mucosa.   Cardiovascular: Normal rate.   Varicose veins of the left thigh and calf  Pulmonary/Chest: Effort normal.  Abdominal: She exhibits no distension.  Musculoskeletal:  Left calf and thigh remarkable for mild tenderness, no erythema, cellulitis, or signs of septic joint  Neurological: She is alert and oriented to person, place, and time.  Skin: Skin is warm and dry.  Psychiatric: She has a normal mood and affect.    ED Course  Procedures (including critical care time)  DIAGNOSTIC STUDIES: Oxygen Saturation is 100% on RA, normal by my interpretation.    COORDINATION OF CARE: 3:21 PM Discussed treatment plan with pt at bedside and pt agreed to plan.   Labs Review Labs Reviewed - No data to display  Imaging Review No results found.   EKG Interpretation None      MDM   Final diagnoses:  Varicose veins  Pain, dental   Patient with left calf pain, and varicose veins. Pain started yesterday. I advised patient that she likely needs to have an ultrasound to rule out DVT. She states that she cannot have this done today, she has to leave the emergency department. I told her to return tomorrow morning to radiology to have this completed. I ordered an outpatient study. I strongly advised her to have the study done, and she agrees that she will come back to our morning. There is no evidence for edema or cellulitis. There is some calf tenderness.  Patient with toothache.  No gross abscess.  Exam unconcerning for Ludwig's angina or spread of infection.  Will treat with penicillin and pain medicine.  Urged patient to follow-up with dentist.     I personally performed the services described in this documentation, which was scribed in my presence. The recorded information has been reviewed and is accurate.    Montine Circle, PA-C 02/28/14 1546

## 2014-02-28 NOTE — ED Notes (Signed)
Pt reports that she started having left leg pain yesterday after work. States that she noticed a large bruise to the back of her leg. Also states that she thinks she has a tooth abscess on the right side.

## 2014-02-28 NOTE — Discharge Instructions (Signed)
Return tomorrow for your ultrasound.  Dental Pain A tooth ache may be caused by cavities (tooth decay). Cavities expose the nerve of the tooth to air and hot or cold temperatures. It may come from an infection or abscess (also called a boil or furuncle) around your tooth. It is also often caused by dental caries (tooth decay). This causes the pain you are having. DIAGNOSIS  Your caregiver can diagnose this problem by exam. TREATMENT   If caused by an infection, it may be treated with medications which kill germs (antibiotics) and pain medications as prescribed by your caregiver. Take medications as directed.  Only take over-the-counter or prescription medicines for pain, discomfort, or fever as directed by your caregiver.  Whether the tooth ache today is caused by infection or dental disease, you should see your dentist as soon as possible for further care. SEEK MEDICAL CARE IF: The exam and treatment you received today has been provided on an emergency basis only. This is not a substitute for complete medical or dental care. If your problem worsens or new problems (symptoms) appear, and you are unable to meet with your dentist, call or return to this location. SEEK IMMEDIATE MEDICAL CARE IF:   You have a fever.  You develop redness and swelling of your face, jaw, or neck.  You are unable to open your mouth.  You have severe pain uncontrolled by pain medicine. MAKE SURE YOU:   Understand these instructions.  Will watch your condition.  Will get help right away if you are not doing well or get worse. Document Released: 06/10/2005 Document Revised: 09/02/2011 Document Reviewed: 01/27/2008 [facility] Patient Information 2015 [place], [place]. This information is not intended to replace advice given to you by your health care provider. Make sure you discuss any questions you have with your health care provider. Deep Vein Thrombosis A deep vein thrombosis (DVT) is a blood clot that develops  in the deep, larger veins of the leg, arm, or pelvis. These are more dangerous than clots that might form in veins near the surface of the body. A DVT can lead to serious and even life-threatening complications if the clot breaks off and travels in the bloodstream to the lungs.  A DVT can damage the valves in your leg veins so that instead of flowing upward, the blood pools in the lower leg. This is called post-thrombotic syndrome, and it can result in pain, swelling, discoloration, and sores on the leg. CAUSES Usually, several things contribute to the formation of blood clots. Contributing factors include:  The flow of blood slows down.  The inside of the vein is damaged in some way.  You have a condition that makes blood clot more easily. RISK FACTORS Some people are more likely than others to develop blood clots. Risk factors include:   Smoking.  Being overweight (obese).  Sitting or lying still for a long time. This includes long-distance travel, paralysis, or recovery from an illness or surgery. Other factors that increase risk are:   Older age, especially over 23 years of age.  Having a family history of blood clots or if you have already had a blot clot.  Having major or lengthy surgery. This is especially true for surgery on the hip, knee, or belly (abdomen). Hip surgery is particularly high risk.  Having a long, thin tube (catheter) placed inside a vein during a medical procedure.  Breaking a hip or leg.  Having cancer or cancer treatment.  Pregnancy and childbirth.  Hormone  changes make the blood clot more easily during pregnancy.  The fetus puts pressure on the veins of the pelvis.  There is a risk of injury to veins during delivery or a caesarean delivery. The risk is highest just after childbirth.  Medicines containing the female hormone estrogen. This includes birth control pills and hormone replacement therapy.  Other circulation or heart problems.  SIGNS  AND SYMPTOMS When a clot forms, it can either partially or totally block the blood flow in that vein. Symptoms of a DVT can include:  Swelling of the leg or arm, especially if one side is much worse.  Warmth and redness of the leg or arm, especially if one side is much worse.  Pain in an arm or leg. If the clot is in the leg, symptoms may be more noticeable or worse when standing or walking. The symptoms of a DVT that has traveled to the lungs (pulmonary embolism, PE) usually start suddenly and include:  Shortness of breath.  Coughing.  Coughing up blood or blood-tinged mucus.  Chest pain. The chest pain is often worse with deep breaths.  Rapid heartbeat. Anyone with these symptoms should get emergency medical treatment right away. Do not wait to see if the symptoms will go away. Call your local emergency services (911 in the [place] if you have these symptoms. Do not drive yourself to the hospital. DIAGNOSIS If a DVT is suspected, your health care provider will take a full medical history and perform a physical exam. Tests that also may be required include:  Blood tests, including studies of the clotting properties of the blood.  Ultrasound to see if you have clots in your legs or lungs.  X-rays to show the flow of blood when dye is injected into the veins (venogram).  Studies of your lungs if you have any chest symptoms. PREVENTION  Exercise the legs regularly. Take a brisk 30-minute walk every day.  Maintain a weight that is appropriate for your height.  Avoid sitting or lying in bed for long periods of time without moving your legs.  Women, particularly those over the age of 96 years, should consider the risks and benefits of taking estrogen medicines, including birth control pills.  Do not smoke, especially if you take estrogen medicines.  Long-distance travel can increase your risk of DVT. You should exercise your legs by walking or pumping the muscles every  hour.  Many of the risk factors above relate to situations that exist with hospitalization, either for illness, injury, or elective surgery. Prevention may include medical and nonmedical measures.  Your health care provider will assess you for the need for venous thromboembolism prevention when you are admitted to the hospital. If you are having surgery, your surgeon will assess you the day of or day after surgery. TREATMENT Once identified, a DVT can be treated. It can also be prevented in some circumstances. Once you have had a DVT, you may be at increased risk for a DVT in the future. The most common treatment for DVT is blood-thinning (anticoagulant) medicine, which reduces the blood's tendency to clot. Anticoagulants can stop new blood clots from forming and stop old clots from growing. They cannot dissolve existing clots. Your body does this by itself over time. Anticoagulants can be given by mouth, through an IV tube, or by injection. Your health care provider will determine the best program for you. Other medicines or treatments that may be used are:  Heparin or related medicines (low molecular weight heparin)  are often the first treatment for a blood clot. They act quickly. However, they cannot be taken orally and must be given either in shot form or by IV tube.  Heparin can cause a fall in a component of blood that stops bleeding and forms blood clots (platelets). You will be monitored with blood tests to be sure this does not occur.  Warfarin is an anticoagulant that can be swallowed. It takes a few days to start working, so usually heparin or related medicines are used in combination. Once warfarin is working, heparin is usually stopped.  Factor Xa inhibitor medicines, such as rivaroxaban and apixaban, also reduce blood clotting. These medicines are taken orally and can often be used without heparin or related medicines.  Less commonly, clot dissolving drugs (thrombolytics) are used to  dissolve a DVT. They carry a high risk of bleeding, so they are used mainly in severe cases where your life or a part of your body is threatened.  Very rarely, a blood clot in the leg needs to be removed surgically.  If you are unable to take anticoagulants, your health care provider may arrange for you to have a filter placed in a main vein in your abdomen. This filter prevents clots from traveling to your lungs. HOME CARE INSTRUCTIONS  Take all medicines as directed by your health care provider.  Learn as much as you can about DVT.  Wear a medical alert bracelet or carry a medical alert card.  Ask your health care provider how soon you can go back to normal activities. It is important to stay active to prevent blood clots. If you are on anticoagulant medicine, avoid contact sports.  It is very important to exercise. This is especially important while traveling, sitting, or standing for long periods of time. Exercise your legs by walking or by tightening and relaxing your leg muscles regularly. Take frequent walks.  You may need to wear compression stockings. These are tight elastic stockings that apply pressure to the lower legs. This pressure can help keep the blood in the legs from clotting. Taking Warfarin Warfarin is a daily medicine that is taken by mouth. Your health care provider will advise you on the length of treatment (usually 3-6 months, sometimes lifelong). If you take warfarin:  Understand how to take warfarin and foods that can affect how warfarin works in Veterinary surgeon.  Too much and too little warfarin are both dangerous. Too much warfarin increases the risk of bleeding. Too little warfarin continues to allow the risk for blood clots. Warfarin and Regular Blood Testing While taking warfarin, you will need to have regular blood tests to measure your blood clotting time. These blood tests usually include both the prothrombin time (PT) and international normalized ratio (INR)  tests. The PT and INR results allow your health care provider to adjust your dose of warfarin. It is very important that you have your PT and INR tested as often as directed by your health care provider.  Warfarin and Your Diet Avoid major changes in your diet, or notify your health care provider before changing your diet. Arrange a visit with a registered dietitian to answer your questions. Many foods, especially foods high in vitamin K, can interfere with warfarin and affect the PT and INR results. You should eat a consistent amount of foods high in vitamin K. Foods high in vitamin K include:   Spinach, kale, broccoli, cabbage, collard and turnip greens, Brussels sprouts, peas, cauliflower, seaweed, and parsley.  Beef  and pork liver.  Green tea.  Soybean oil. Warfarin with Other Medicines Many medicines can interfere with warfarin and affect the PT and INR results. You must:  Tell your health care provider about any and all medicines, vitamins, and supplements you take, including aspirin and other over-the-counter anti-inflammatory medicines. Be especially cautious with aspirin and anti-inflammatory medicines. Ask your health care provider before taking these.  Do not take or discontinue any prescribed or over-the-counter medicine except on the advice of your health care provider or pharmacist. Warfarin Side Effects Warfarin can have side effects, such as easy bruising and difficulty stopping bleeding. Ask your health care provider or pharmacist about other side effects of warfarin. You will need to:  Hold pressure over cuts for longer than usual.  Notify your dentist and other health care providers that you are taking warfarin before you undergo any procedures where bleeding may occur. Warfarin with Alcohol and Tobacco   Drinking alcohol frequently can increase the effect of warfarin, leading to excess bleeding. It is best to avoid alcoholic drinks or to consume only very small amounts  while taking warfarin. Notify your health care provider if you change your alcohol intake.   Do not use any tobacco products including cigarettes, chewing tobacco, or electronic cigarettes. If you smoke, quit. Ask your health care provider for help with quitting smoking. Alternative Medicines to Warfarin: Factor Xa Inhibitor Medicines  These blood-thinning medicines are taken by mouth, usually for several weeks or longer. It is important to take the medicine every single day at the same time each day.  There are no regular blood tests required when using these medicines.  There are fewer food and drug interactions than with warfarin.  The side effects of this class of medicine are similar to those of warfarin, including excessive bruising or bleeding. Ask your health care provider or pharmacist about other potential side effects. SEEK MEDICAL CARE IF:  You notice a rapid heartbeat.  You feel weaker or more tired than usual.  You feel faint.  You notice increased bruising.  You feel your symptoms are not getting better in the time expected.  You believe you are having side effects of medicine. SEEK IMMEDIATE MEDICAL CARE IF:  You have chest pain.  You have trouble breathing.  You have new or increased swelling or pain in one leg.  You cough up blood.  You notice blood in vomit, in a bowel movement, or in urine. MAKE SURE YOU:  Understand these instructions.  Will watch your condition.  Will get help right away if you are not doing well or get worse. Document Released: 06/10/2005 Document Revised: 10/25/2013 Document Reviewed: 02/15/2013 [facility] Patient Information 2015 [place], [place]. This information is not intended to replace advice given to you by your health care provider. Make sure you discuss any questions you have with your health care provider.

## 2014-03-01 ENCOUNTER — Ambulatory Visit (HOSPITAL_COMMUNITY)
Admission: RE | Admit: 2014-03-01 | Discharge: 2014-03-01 | Disposition: A | Discharge status: Home / Self Care | Payer: BC Managed Care – PPO | Source: Ambulatory Visit | Attending: Emergency Medicine | Admitting: Emergency Medicine

## 2014-03-01 DIAGNOSIS — M79609 Pain in unspecified limb: Secondary | ICD-10-CM

## 2014-03-01 DIAGNOSIS — M7989 Other specified soft tissue disorders: Secondary | ICD-10-CM | POA: Insufficient documentation

## 2014-03-01 DIAGNOSIS — I8390 Asymptomatic varicose veins of unspecified lower extremity: Secondary | ICD-10-CM | POA: Insufficient documentation

## 2014-03-01 DIAGNOSIS — R209 Unspecified disturbances of skin sensation: Secondary | ICD-10-CM | POA: Insufficient documentation

## 2014-03-01 DIAGNOSIS — R229 Localized swelling, mass and lump, unspecified: Secondary | ICD-10-CM

## 2014-03-01 NOTE — Progress Notes (Signed)
VASCULAR LAB PRELIMINARY  PRELIMINARY  PRELIMINARY  PRELIMINARY  Left lower extremity venous duplex completed.    Preliminary report:  Left leg is negative for deep and superficial vein thrombosis.  There is a large bruise in the proximal popliteal fossa.  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  All are easily compressible and are not thrombosed.  Enmanuel Zufall, RVT 03/01/2014, 11:17 AM

## 2014-03-02 NOTE — ED Provider Notes (Signed)
Medical screening examination/treatment/procedure(s) were performed by non-physician practitioner and as supervising physician I was immediately available for consultation/collaboration.    Johnna Acosta, MD 03/02/14 [phone]

## 2014-04-25 ENCOUNTER — Encounter (HOSPITAL_COMMUNITY): Payer: Self-pay | Admitting: Emergency Medicine

## 2014-05-02 ENCOUNTER — Encounter (HOSPITAL_COMMUNITY): Payer: Self-pay | Admitting: *Deleted

## 2014-05-02 ENCOUNTER — Emergency Department (HOSPITAL_COMMUNITY)
Admission: EM | Admit: 2014-05-02 | Discharge: 2014-05-02 | Disposition: A | Discharge status: Home / Self Care | Payer: Self-pay | Attending: Emergency Medicine | Admitting: Emergency Medicine

## 2014-05-02 DIAGNOSIS — Z79899 Other long term (current) drug therapy: Secondary | ICD-10-CM | POA: Insufficient documentation

## 2014-05-02 DIAGNOSIS — Z862 Personal history of diseases of the blood and blood-forming organs and certain disorders involving the immune mechanism: Secondary | ICD-10-CM | POA: Insufficient documentation

## 2014-05-02 DIAGNOSIS — K047 Periapical abscess without sinus: Secondary | ICD-10-CM | POA: Insufficient documentation

## 2014-05-02 DIAGNOSIS — Z880 Allergy status to penicillin: Secondary | ICD-10-CM | POA: Insufficient documentation

## 2014-05-02 DIAGNOSIS — Z8742 Personal history of other diseases of the female genital tract: Secondary | ICD-10-CM | POA: Insufficient documentation

## 2014-05-02 DIAGNOSIS — K0889 Other specified disorders of teeth and supporting structures: Secondary | ICD-10-CM

## 2014-05-02 DIAGNOSIS — Z87891 Personal history of nicotine dependence: Secondary | ICD-10-CM | POA: Insufficient documentation

## 2014-05-02 DIAGNOSIS — Z792 Long term (current) use of antibiotics: Secondary | ICD-10-CM | POA: Insufficient documentation

## 2014-05-02 DIAGNOSIS — Z8659 Personal history of other mental and behavioral disorders: Secondary | ICD-10-CM | POA: Insufficient documentation

## 2014-05-02 DIAGNOSIS — K088 Other specified disorders of teeth and supporting structures: Secondary | ICD-10-CM | POA: Insufficient documentation

## 2014-05-02 MED ORDER — TRAMADOL HCL 50 MG PO TABS: 50.0000 mg | ORAL_TABLET | Freq: Four times a day (QID) | ORAL | Status: DC | PRN

## 2014-05-02 MED ORDER — CLINDAMYCIN HCL 150 MG PO CAPS: 300.0000 mg | ORAL_CAPSULE | Freq: Three times a day (TID) | ORAL | Status: DC

## 2014-05-02 MED ORDER — IBUPROFEN 400 MG PO TABS
400.0000 mg | ORAL_TABLET | Freq: Once | ORAL | Status: AC
Start: 2014-05-02 — End: 2014-05-02
  Administered 2014-05-02: 400 mg via ORAL
  Filled 2014-05-02: qty 1

## 2014-05-02 NOTE — ED Notes (Signed)
PT reports a 3 day Hx of dental pain on RT lower tooth

## 2014-05-02 NOTE — ED Provider Notes (Signed)
CSN: [id number]     Arrival date & time 05/02/14  1210 History  This chart was scribed for non-physician practitioner, Montine Circle, PA-C, working with Virgel Manifold, MD by Ladene Artist, ED Scribe. This patient was seen in room TR05C/TR05C and the patient's care was started at 12:48 PM.   Chief Complaint  Patient presents with  . Dental Problem   The history is provided by the patient. No language interpreter was used.   HPI Comments: Breanna Miranda is a 28 y.o. female who presents to the Emergency Department with a chief complaint of constant dental pain onset 3 days ago. Pt reports dental abscess to a tooth on the R lower side. She reports similar h/o dental pain. Pt has tried applying ointment to her tooth without relief. 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   Past Medical History  Diagnosis Date  . Menorrhagia   . Anemia   . Anxiety    Past Surgical History  Procedure Laterality Date  . Cholecystectomy    . Tubal ligation     Family History  Problem Relation Age of Onset  . Diabetes Father    History  Substance Use Topics  . Smoking status: Former Smoker    Quit date: 05/13/2009  . Smokeless tobacco: Never Used  . Alcohol Use: 0.6 oz/week    1 Glasses of wine per week     Comment: occaional   OB History    Gravida Para Term Preterm AB TAB SAB Ectopic Multiple Living   5 4 2 2 1  1   4      Review of Systems  Constitutional: Negative for fever and chills.  HENT: Positive for dental problem. Negative for drooling.   Respiratory: Negative for shortness of breath.   Cardiovascular: Negative for chest pain.  Gastrointestinal: Negative for nausea, vomiting, diarrhea and constipation.  Genitourinary: Negative for dysuria.  Neurological: Negative for speech difficulty.  Psychiatric/Behavioral: Positive for sleep disturbance.   Allergies  Shrimp and Penicillins  Home Medications   Prior to Admission medications   Medication Sig Start Date End Date  Taking? Authorizing Provider  acetaminophen (TYLENOL) 500 MG tablet Take 1,000 mg by mouth daily as needed for moderate pain.    Historical Provider, MD  clindamycin (CLEOCIN) 150 MG capsule Take 2 capsules (300 mg total) by mouth 3 (three) times daily. May dispense as 150mg  capsules 02/28/14   Montine Circle, PA-C  clindamycin (CLEOCIN) 300 MG capsule Take 1 capsule (300 mg total) by mouth 4 (four) times daily. X 7 days 01/13/14   Lutricia Feil, PA  cyclobenzaprine (FLEXERIL) 10 MG tablet Take 1 tablet (10 mg total) by mouth 2 (two) times daily as needed for muscle spasms (or back pain). 11/28/13   Clayton Bibles, PA-C  diclofenac (VOLTAREN) 75 MG EC tablet Take 1 tablet (75 mg total) by mouth 2 (two) times daily. As needed for pain 01/13/14   Lutricia Feil, PA  HYDROcodone-acetaminophen (NORCO/VICODIN) 5-325 MG per tablet Take 1 tablet by mouth every 6 (six) hours as needed. 02/28/14   Montine Circle, PA-C  lidocaine (XYLOCAINE) 2 % solution Apply to affected area of pain using cotton swab every 3 hours as needed for pain 01/13/14   Lutricia Feil, PA  methocarbamol (ROBAXIN) 750 MG tablet Take 750 mg by mouth 4 (four) times daily as needed for muscle spasms.     Historical Provider, MD  naproxen (NAPROSYN) 500 MG tablet Take 500 mg by  mouth 2 (two) times daily as needed (pain). 09/17/13   Jarrett Soho Muthersbaugh, PA-C  Prenatal Vit-Fe Fumarate-FA (PRENATAL MULTIVITAMIN) TABS tablet Take 1 tablet by mouth daily at 12 noon.    Historical Provider, MD  ranitidine (ZANTAC) 150 MG tablet Take 150 mg by mouth daily.    Historical Provider, MD   Triage Vitals: BP 119/62 mmHg  Pulse 78  Temp(Src) 98.5 F (36.9 C) (Oral)  Resp 16  Ht 5\' 4"  (1.626 m)  Wt 185 lb (83.915 kg)  BMI 31.74 kg/m2  SpO2 100%  LMP 04/24/2014 Physical Exam  Constitutional: She is oriented to person, place, and time. She appears well-developed and well-nourished. No distress.  HENT:  Head: Normocephalic and  atraumatic.  Mouth/Throat:    Poor dentition throughout.  Affected tooth as diagrammed.  No signs of peritonsillar or tonsillar abscess.  No signs of gingival abscess. Oropharynx is clear and without exudates.  Uvula is midline.  Airway is intact. No signs of Ludwig's angina with palpation of oral and sublingual mucosa.   Eyes: Conjunctivae and EOM are normal.  Neck: Normal range of motion. Neck supple.  Cardiovascular: Normal rate.   Pulmonary/Chest: Effort normal. No respiratory distress.  Abdominal: She exhibits no distension.  Musculoskeletal: Normal range of motion.  Neurological: She is alert and oriented to person, place, and time.  Skin: Skin is warm and dry.  Psychiatric: She has a normal mood and affect. Her behavior is normal. Judgment and thought content normal.  Nursing note and vitals reviewed.  ED Course  Procedures (including critical care time) DIAGNOSTIC STUDIES: Oxygen Saturation is 100% on RA, normal by my interpretation.    COORDINATION OF CARE: 12:51 PM-Discussed treatment plan which includes antibiotics, medication for pain and follow-up with dentist with pt at bedside and pt agreed to plan.   Labs Review Labs Reviewed - No data to display  Imaging Review No results found.   EKG Interpretation None      MDM   Final diagnoses:  Pain, dental   Patient with toothache.  No gross abscess.  Exam unconcerning for Ludwig's angina or spread of infection.  Will treat with penicillin and pain medicine.  Urged patient to follow-up with dentist.     I personally performed the services described in this documentation, which was scribed in my presence. The recorded information has been reviewed and is accurate.    Montine Circle, PA-C 05/02/14 1255  Virgel Manifold, MD 05/03/14 [phone]

## 2014-05-02 NOTE — Discharge Instructions (Signed)

## 2014-06-03 ENCOUNTER — Inpatient Hospital Stay (HOSPITAL_COMMUNITY)
Admission: AD | Admit: 2014-06-03 | Discharge: 2014-06-03 | Disposition: A | Discharge status: Home / Self Care | Payer: Self-pay | Source: Ambulatory Visit | Attending: Obstetrics and Gynecology | Admitting: Obstetrics and Gynecology

## 2014-06-03 ENCOUNTER — Encounter (HOSPITAL_COMMUNITY): Payer: Self-pay | Admitting: *Deleted

## 2014-06-03 DIAGNOSIS — B373 Candidiasis of vulva and vagina: Secondary | ICD-10-CM

## 2014-06-03 DIAGNOSIS — Z87891 Personal history of nicotine dependence: Secondary | ICD-10-CM | POA: Insufficient documentation

## 2014-06-03 DIAGNOSIS — B3731 Acute candidiasis of vulva and vagina: Secondary | ICD-10-CM

## 2014-06-03 DIAGNOSIS — N390 Urinary tract infection, site not specified: Secondary | ICD-10-CM

## 2014-06-03 LAB — URINALYSIS, ROUTINE W REFLEX MICROSCOPIC
BILIRUBIN URINE: NEGATIVE
Glucose, UA: NEGATIVE mg/dL
KETONES UR: NEGATIVE mg/dL
Nitrite: POSITIVE — AB
PH: 7.5 (ref 5.0–8.0)
Protein, ur: NEGATIVE mg/dL
SPECIFIC GRAVITY, URINE: 1.02 (ref 1.005–1.030)
Urobilinogen, UA: 1 mg/dL (ref 0.0–1.0)

## 2014-06-03 LAB — POCT PREGNANCY, URINE: Preg Test, Ur: NEGATIVE

## 2014-06-03 LAB — URINE MICROSCOPIC-ADD ON

## 2014-06-03 LAB — HIV ANTIBODY (ROUTINE TESTING W REFLEX): HIV 1&2 Ab, 4th Generation: NONREACTIVE

## 2014-06-03 LAB — WET PREP, GENITAL: TRICH WET PREP: NONE SEEN

## 2014-06-03 MED ORDER — SULFAMETHOXAZOLE-TRIMETHOPRIM 800-160 MG PO TABS: 1.0000 | ORAL_TABLET | Freq: Two times a day (BID) | ORAL | Status: DC

## 2014-06-03 MED ORDER — FLUCONAZOLE 150 MG PO TABS: 150.0000 mg | ORAL_TABLET | Freq: Every day | ORAL | Status: DC

## 2014-06-03 NOTE — MAU Note (Signed)
Pt thought she had a yeast infection a week ago, used monistat.  Has a lot of itching & burning now, white liquid discharge, also vaginal pain.

## 2014-06-03 NOTE — MAU Provider Note (Signed)
History     CSN: [id number]  Arrival date and time: 06/03/14 1962   First Provider Initiated Contact with Patient 06/03/14 0800      Chief Complaint  Patient presents with  . Vaginal Itching   HPI Comments: Breanna Miranda 28 y.o. [id number] presents to MAU with vaginal discharge that she believes is yeast. She treated herself last week with OTC Monistat for one day with little relief. She is sexually active with one partner and had BTL. She gets her GYN care at [facility].   Vaginal Itching      Past Medical History  Diagnosis Date  . Menorrhagia   . Anemia   . Anxiety     Past Surgical History  Procedure Laterality Date  . Cholecystectomy    . Tubal ligation      Family History  Problem Relation Age of Onset  . Diabetes Father     History  Substance Use Topics  . Smoking status: Former Smoker    Quit date: 05/13/2009  . Smokeless tobacco: Never Used  . Alcohol Use: 0.6 oz/week    1 Glasses of wine per week     Comment: occaional    Allergies:  Allergies  Allergen Reactions  . Penicillins Anaphylaxis, Itching and Swelling    Swelling -tongue  . Shrimp [Shellfish Allergy] Anaphylaxis, Hives and Swelling    Prescriptions prior to admission  Medication Sig Dispense Refill Last Dose  . acetaminophen (TYLENOL) 500 MG tablet Take 1,000 mg by mouth daily as needed for moderate pain.   Past Week at Unknown time  . ibuprofen (ADVIL,MOTRIN) 200 MG tablet Take 400 mg by mouth every 6 (six) hours as needed for mild pain.   06/03/2014 at Unknown time  . ranitidine (ZANTAC) 150 MG tablet Take 150 mg by mouth daily.   06/03/2014 at Unknown time  . clindamycin (CLEOCIN) 150 MG capsule Take 2 capsules (300 mg total) by mouth 3 (three) times daily. May dispense as 150mg  capsules (Patient not taking: Reported on 06/03/2014) 60 capsule 0   . cyclobenzaprine (FLEXERIL) 10 MG tablet Take 1 tablet (10 mg total) by mouth 2 (two) times daily as needed for muscle spasms (or back  pain). (Patient not taking: Reported on 06/03/2014) 10 tablet 0   . diclofenac (VOLTAREN) 75 MG EC tablet Take 1 tablet (75 mg total) by mouth 2 (two) times daily. As needed for pain (Patient not taking: Reported on 06/03/2014) 30 tablet 0   . HYDROcodone-acetaminophen (NORCO/VICODIN) 5-325 MG per tablet Take 1 tablet by mouth every 6 (six) hours as needed. (Patient not taking: Reported on 06/03/2014) 10 tablet 0   . lidocaine (XYLOCAINE) 2 % solution Apply to affected area of pain using cotton swab every 3 hours as needed for pain (Patient not taking: Reported on 06/03/2014) 100 mL 0   . traMADol (ULTRAM) 50 MG tablet Take 1 tablet (50 mg total) by mouth every 6 (six) hours as needed. (Patient not taking: Reported on 06/03/2014) 15 tablet 0     Review of Systems  Constitutional: Negative.   HENT: Negative.   Respiratory: Negative.   Cardiovascular: Negative.   Genitourinary:       Vaginal discharge  Musculoskeletal: Negative.   Skin: Negative.   Neurological: Negative.   Psychiatric/Behavioral: Negative.    Physical Exam   Blood pressure 112/67, pulse 78, temperature 98.2 F (36.8 C), temperature source Oral, resp. rate 16, height 5\' 4"  (1.626 m), weight 87.204 kg (192 lb 4 oz), last menstrual  period 05/23/2014.  Physical Exam  Constitutional: She is oriented to person, place, and time. She appears well-developed and well-nourished.  HENT:  Head: Normocephalic and atraumatic.  Eyes: Pupils are equal, round, and reactive to light.  GI: Soft. She exhibits no distension. There is no tenderness. There is no rebound.  Genitourinary:  Genital:external/ ted and a bit excoriated  Vaginal:thick white clumpy stuff that maybe Monistat Cervix:closed/ thick Bimanual:nontender/ no mass   Musculoskeletal: Normal range of motion.  Neurological: She is alert and oriented to person, place, and time.  Skin: Skin is warm and dry.  Psychiatric: She has a normal mood and affect. Her behavior is  normal. Judgment and thought content normal.   Results for orders placed or performed during the hospital encounter of 06/03/14 (from the past 24 hour(s))  Urinalysis, Routine w reflex microscopic     Status: Abnormal   Collection Time: 06/03/14  7:27 AM  Result Value Ref Range   Color, Urine YELLOW YELLOW   APPearance CLEAR CLEAR   Specific Gravity, Urine 1.020 1.005 - 1.030   pH 7.5 5.0 - 8.0   Glucose, UA NEGATIVE NEGATIVE mg/dL   Hgb urine dipstick LARGE (A) NEGATIVE   Bilirubin Urine NEGATIVE NEGATIVE   Ketones, ur NEGATIVE NEGATIVE mg/dL   Protein, ur NEGATIVE NEGATIVE mg/dL   Urobilinogen, UA 1.0 0.0 - 1.0 mg/dL   Nitrite POSITIVE (A) NEGATIVE   Leukocytes, UA SMALL (A) NEGATIVE  Urine microscopic-add on     Status: Abnormal   Collection Time: 06/03/14  7:27 AM  Result Value Ref Range   Squamous Epithelial / LPF RARE RARE   WBC, UA 7-10 <3 WBC/hpf   RBC / HPF 3-6 <3 RBC/hpf   Bacteria, UA MANY (A) RARE  Pregnancy, urine POC     Status: None   Collection Time: 06/03/14  7:40 AM  Result Value Ref Range   Preg Test, Ur NEGATIVE NEGATIVE  Wet prep, genital     Status: Abnormal   Collection Time: 06/03/14  8:22 AM  Result Value Ref Range   Yeast Wet Prep HPF POC FEW (A) NONE SEEN   Trich, Wet Prep NONE SEEN NONE SEEN   Clue Cells Wet Prep HPF POC FEW (A) NONE SEEN   WBC, Wet Prep HPF POC FEW (A) NONE SEEN     MAU Course  Procedures  MDM Wet prep/ gc/chlamydia/HIV  Assessment and Plan   A: UTI Vaginal Yeast  P: Bactrium DS po BID x 5 days Diflucan 150 mg/ refill x1 Increase fluids Follow up with GYN Return to MAU as needed  Georgia Duff 06/03/2014, 8:31 AM

## 2014-06-03 NOTE — MAU Note (Signed)
Pt states here for severe vaginal pain and itching. Has thin white odorous vaginal discharge.

## 2014-06-03 NOTE — Discharge Instructions (Signed)
Candida Infection A Candida infection (also called yeast, fungus, and Monilia infection) is an overgrowth of yeast that can occur anywhere on the body. A yeast infection commonly occurs in warm, moist body areas. Usually, the infection remains localized but can spread to become a systemic infection. A yeast infection may be a sign of a more severe disease such as diabetes, leukemia, or AIDS. A yeast infection can occur in both men and women. In women, Candida vaginitis is a vaginal infection. It is one of the most common causes of vaginitis. Men usually do not have symptoms or know they have an infection until other problems develop. Men may find out they have a yeast infection because their sex partner has a yeast infection. Uncircumcised men are more likely to get a yeast infection than circumcised men. This is because the uncircumcised glans is not exposed to air and does not remain as dry as that of a circumcised glans. Older adults may develop yeast infections around dentures. CAUSES  Women  Antibiotics.  Steroid medication taken for a long time.  Being overweight (obese).  Diabetes.  Poor immune condition.  Certain serious medical conditions.  Immune suppressive medications for organ transplant patients.  Chemotherapy.  Pregnancy.  Menstruation.  Stress and fatigue.  Intravenous drug use.  Oral contraceptives.  Wearing tight-fitting clothes in the crotch area.  Catching it from a sex partner who has a yeast infection.  Spermicide.  Intravenous, urinary, or other catheters. Men  Catching it from a sex partner who has a yeast infection.  Having oral or anal sex with a person who has the infection.  Spermicide.  Diabetes.  Antibiotics.  Poor immune system.  Medications that suppress the immune system.  Intravenous drug use.  Intravenous, urinary, or other catheters. SYMPTOMS  Women  Thick, white vaginal discharge.  Vaginal itching.  Redness and  swelling in and around the vagina.  Irritation of the lips of the vagina and perineum.  Blisters on the vaginal lips and perineum.  Painful sexual intercourse.  Low blood sugar (hypoglycemia).  Painful urination.  Bladder infections.  Intestinal problems such as constipation, indigestion, bad breath, bloating, increase in gas, diarrhea, or loose stools. Men  Men may develop intestinal problems such as constipation, indigestion, bad breath, bloating, increase in gas, diarrhea, or loose stools.  Dry, cracked skin on the penis with itching or discomfort.  Jock itch.  Dry, flaky skin.  Athlete's foot.  Hypoglycemia. DIAGNOSIS  Women  A history and an exam are performed.  The discharge may be examined under a microscope.  A culture may be taken of the discharge. Men  A history and an exam are performed.  Any discharge from the penis or areas of cracked skin will be looked at under the microscope and cultured.  Stool samples may be cultured. TREATMENT  Women  Vaginal antifungal suppositories and creams.  Medicated creams to decrease irritation and itching on the outside of the vagina.  Warm compresses to the perineal area to decrease swelling and discomfort.  Oral antifungal medications.  Medicated vaginal suppositories or cream for repeated or recurrent infections.  Wash and dry the irritation areas before applying the cream.  Eating yogurt with Lactobacillus may help with prevention and treatment.  Sometimes painting the vagina with gentian violet solution may help if creams and suppositories do not work. Men  Antifungal creams and oral antifungal medications.  Sometimes treatment must continue for 30 days after the symptoms go away to prevent recurrence. HOME CARE INSTRUCTIONS  Women  Use cotton underwear and avoid tight-fitting clothing.  Avoid colored, scented toilet paper and deodorant tampons or pads.  Do not douche.  Keep your diabetes  under control.  Finish all the prescribed medications.  Keep your skin clean and dry.  Consume milk or yogurt with Lactobacillus-active culture regularly. If you get frequent yeast infections and think that is what the infection is, there are over-the-counter medications that you can get. If the infection does not show healing in 3 days, talk to your caregiver.  Tell your sex partner you have a yeast infection. Your partner may need treatment also, especially if your infection does not clear up or recurs. Men  Keep your skin clean and dry.  Keep your diabetes under control.  Finish all prescribed medications.  Tell your sex partner that you have a yeast infection so he or she can be treated if necessary. SEEK MEDICAL CARE IF:   Your symptoms do not clear up or worsen in one week after treatment.  You have an oral temperature above 102 F (38.9 C).  You have trouble swallowing or eating for a prolonged time.  You develop blisters on and around your vagina.  You develop vaginal bleeding and it is not your menstrual period.  You develop abdominal pain.  You develop intestinal problems as mentioned above.  You get weak or light-headed.  You have painful or increased urination.  You have pain during sexual intercourse. MAKE SURE YOU:   Understand these instructions.  Will watch your condition.  Will get help right away if you are not doing well or get worse. Document Released: 07/18/2004 Document Revised: 10/25/2013 Document Reviewed: 10/30/2009 ExitCare Patient Information 2015 [employer]. This information is not intended to replace advice given to you by your health care provider. Make sure you discuss any questions you have with your health care provider. Urinary Tract Infection Urinary tract infections (UTIs) can develop anywhere along your urinary tract. Your urinary tract is your body's drainage system for removing wastes and extra water. Your urinary tract  includes two kidneys, two ureters, a bladder, and a urethra. Your kidneys are a pair of bean-shaped organs. Each kidney is about the size of your fist. They are located below your ribs, one on each side of your spine. CAUSES Infections are caused by microbes, which are microscopic organisms, including fungi, viruses, and bacteria. These organisms are so small that they can only be seen through a microscope. Bacteria are the microbes that most commonly cause UTIs. SYMPTOMS  Symptoms of UTIs may vary by age and gender of the patient and by the location of the infection. Symptoms in young women typically include a frequent and intense urge to urinate and a painful, burning feeling in the bladder or urethra during urination. Older women and men are more likely to be tired, shaky, and weak and have muscle aches and abdominal pain. A fever may mean the infection is in your kidneys. Other symptoms of a kidney infection include pain in your back or sides below the ribs, nausea, and vomiting. DIAGNOSIS To diagnose a UTI, your caregiver will ask you about your symptoms. Your caregiver also will ask to provide a urine sample. The urine sample will be tested for bacteria and white blood cells. White blood cells are made by your body to help fight infection. TREATMENT  Typically, UTIs can be treated with medication. Because most UTIs are caused by a bacterial infection, they usually can be treated with the use of   antibiotics. The choice of antibiotic and length of treatment depend on your symptoms and the type of bacteria causing your infection. HOME CARE INSTRUCTIONS  If you were prescribed antibiotics, take them exactly as your caregiver instructs you. Finish the medication even if you feel better after you have only taken some of the medication.  Drink enough water and fluids to keep your urine clear or pale yellow.  Avoid caffeine, tea, and carbonated beverages. They tend to irritate your bladder.  Empty  your bladder often. Avoid holding urine for long periods of time.  Empty your bladder before and after sexual intercourse.  After a bowel movement, women should cleanse from front to back. Use each tissue only once. SEEK MEDICAL CARE IF:   You have back pain.  You develop a fever.  Your symptoms do not begin to resolve within 3 days. SEEK IMMEDIATE MEDICAL CARE IF:   You have severe back pain or lower abdominal pain.  You develop chills.  You have nausea or vomiting.  You have continued burning or discomfort with urination. MAKE SURE YOU:   Understand these instructions.  Will watch your condition.  Will get help right away if you are not doing well or get worse. Document Released: 03/20/2005 Document Revised: 12/10/2011 Document Reviewed: 07/19/2011 ExitCare Patient Information 2015 [employer]. This information is not intended to replace advice given to you by your health care provider. Make sure you discuss any questions you have with your health care provider.  

## 2014-06-04 LAB — GC/CHLAMYDIA PROBE AMP
CT Probe RNA: NEGATIVE
GC Probe RNA: NEGATIVE

## 2014-06-04 LAB — URINE CULTURE: SPECIAL REQUESTS: NORMAL

## 2014-06-22 ENCOUNTER — Encounter (HOSPITAL_COMMUNITY): Payer: Self-pay | Admitting: *Deleted

## 2014-06-22 ENCOUNTER — Emergency Department (HOSPITAL_COMMUNITY)
Admission: EM | Admit: 2014-06-22 | Discharge: 2014-06-22 | Disposition: A | Discharge status: Home / Self Care | Payer: Self-pay | Attending: Emergency Medicine | Admitting: Emergency Medicine

## 2014-06-22 DIAGNOSIS — K029 Dental caries, unspecified: Secondary | ICD-10-CM | POA: Insufficient documentation

## 2014-06-22 DIAGNOSIS — R21 Rash and other nonspecific skin eruption: Secondary | ICD-10-CM | POA: Insufficient documentation

## 2014-06-22 DIAGNOSIS — Z79899 Other long term (current) drug therapy: Secondary | ICD-10-CM | POA: Insufficient documentation

## 2014-06-22 DIAGNOSIS — Z87891 Personal history of nicotine dependence: Secondary | ICD-10-CM | POA: Insufficient documentation

## 2014-06-22 DIAGNOSIS — H9209 Otalgia, unspecified ear: Secondary | ICD-10-CM | POA: Insufficient documentation

## 2014-06-22 DIAGNOSIS — K088 Other specified disorders of teeth and supporting structures: Secondary | ICD-10-CM | POA: Insufficient documentation

## 2014-06-22 DIAGNOSIS — F419 Anxiety disorder, unspecified: Secondary | ICD-10-CM | POA: Insufficient documentation

## 2014-06-22 DIAGNOSIS — K047 Periapical abscess without sinus: Secondary | ICD-10-CM | POA: Insufficient documentation

## 2014-06-22 DIAGNOSIS — L853 Xerosis cutis: Secondary | ICD-10-CM | POA: Insufficient documentation

## 2014-06-22 DIAGNOSIS — Z862 Personal history of diseases of the blood and blood-forming organs and certain disorders involving the immune mechanism: Secondary | ICD-10-CM | POA: Insufficient documentation

## 2014-06-22 DIAGNOSIS — Z880 Allergy status to penicillin: Secondary | ICD-10-CM | POA: Insufficient documentation

## 2014-06-22 MED ORDER — HYDROCODONE-ACETAMINOPHEN 5-325 MG PO TABS: 1.0000 | ORAL_TABLET | Freq: Four times a day (QID) | ORAL | Status: DC | PRN

## 2014-06-22 MED ORDER — CLINDAMYCIN HCL 150 MG PO CAPS: 450.0000 mg | ORAL_CAPSULE | Freq: Three times a day (TID) | ORAL | Status: DC

## 2014-06-22 MED ORDER — HYDROCORTISONE 2.5 % EX LOTN: TOPICAL_LOTION | Freq: Two times a day (BID) | CUTANEOUS | Status: DC

## 2014-06-22 NOTE — ED Notes (Signed)
Pt reports right side lower dental pain/abscess for days, no relief with ibuprofen. Also has rash and itching to arms and legs.

## 2014-06-22 NOTE — Discharge Instructions (Signed)
1. Medications: vicodin, clindamycin, usual home medications °2. Treatment: rest, drink plenty of fluids, take medications as prescribed °3. Follow Up: Please followup with dentistry within 1 week for discussion of your diagnoses and further evaluation after today's visit; if you do not have a primary care doctor use the resource guide provided to find one; Return to the ER for high fevers, difficulty breathing, difficulty swallowing or other concerning symptoms ° °

## 2014-06-22 NOTE — ED Notes (Signed)
Introduced self to pt.  C/O right lower jaw pain "I think I have an abscess, you can feel it in the inside."  Also c/o rash to left upper arm and legs for past week.

## 2014-06-22 NOTE — ED Provider Notes (Signed)
CSN: [id number]     Arrival date & time 06/22/14  1527 History  This chart was scribed for non-physician practitioner, Abigail Butts, PA-C working with Ernestina Patches, MD by Tula Nakayama, ED scribe. This patient was seen in room TR07C/TR07C and the patient's care was started at 4:22 PM  Chief Complaint  Patient presents with  . Dental Pain   The history is provided by the patient and medical records. No language interpreter was used.   HPI Comments: Breanna Miranda is a 28 y.o. female with no major medical problems who presents to the Emergency Department complaining of a constant, gradually worsening pain and possible abscess on her right bottom tooth that started a few days ago. She states throbbing headache and right ear pain as associated symptoms. Pt has tried Motrin with some relief. She had an adjacent tooth pulled last month and notes the abscess was present at the time. Patient reports that abscess resolved and his Began approximately 3 days ago. Pt denies feves, chills, nausea, vomiting and facial swelling as associated symptoms.   Pt also complains of intermittent itching on her bilateral arms and legs that started 1 week ago. She denies new environmental factors and recent travel. Pt also denies itching on her chest, back, hands, and rash on her legs and arms as associated symptoms.   Past Medical History  Diagnosis Date  . Menorrhagia   . Anemia   . Anxiety    Past Surgical History  Procedure Laterality Date  . Cholecystectomy    . Tubal ligation     Family History  Problem Relation Age of Onset  . Diabetes Father    History  Substance Use Topics  . Smoking status: Former Smoker    Quit date: 05/13/2009  . Smokeless tobacco: Never Used  . Alcohol Use: 0.6 oz/week    1 Glasses of wine per week     Comment: occaional   OB History    Gravida Para Term Preterm AB TAB SAB Ectopic Multiple Living   5 4 2 2 1  1   4      Review of Systems   Constitutional: Negative for fever, chills and appetite change.  HENT: Positive for dental problem and ear pain. Negative for drooling, facial swelling, nosebleeds, postnasal drip, rhinorrhea and trouble swallowing.   Eyes: Negative for pain and redness.  Respiratory: Negative for cough and wheezing.   Cardiovascular: Negative for chest pain.  Gastrointestinal: Negative for nausea, vomiting and abdominal pain.  Musculoskeletal: Negative for neck pain and neck stiffness.  Skin: Positive for rash. Negative for color change.  Neurological: Positive for headaches. Negative for weakness and light-headedness.  All other systems reviewed and are negative.   Allergies  Penicillins and Shrimp  Home Medications   Prior to Admission medications   Medication Sig Start Date End Date Taking? Authorizing Provider  acetaminophen (TYLENOL) 500 MG tablet Take 1,000 mg by mouth daily as needed for moderate pain.    Historical Provider, MD  clindamycin (CLEOCIN) 150 MG capsule Take 3 capsules (450 mg total) by mouth 3 (three) times daily. 06/22/14   Pharoah Goggins, PA-C  fluconazole (DIFLUCAN) 150 MG tablet Take 1 tablet (150 mg total) by mouth daily. 06/03/14   Olegario Messier, NP  HYDROcodone-acetaminophen (NORCO/VICODIN) 5-325 MG per tablet Take 1-2 tablets by mouth every 6 (six) hours as needed for moderate pain or severe pain. 06/22/14   Ayvin Lipinski, PA-C  hydrocortisone 2.5 % lotion Apply topically 2 (two) times daily.  06/22/14   Chinedu Agustin, PA-C  ibuprofen (ADVIL,MOTRIN) 200 MG tablet Take 400 mg by mouth every 6 (six) hours as needed for mild pain.    Historical Provider, MD  ranitidine (ZANTAC) 150 MG tablet Take 150 mg by mouth daily.    Historical Provider, MD  sulfamethoxazole-trimethoprim (SEPTRA DS) 800-160 MG per tablet Take 1 tablet by mouth every 12 (twelve) hours. 06/03/14   Olegario Messier, NP   BP 111/64 mmHg  Pulse 75  Temp(Src) 98.7 F (37.1 C) (Oral)  Resp  18  SpO2 100%  LMP 06/15/2014 Physical Exam  Constitutional: She is oriented to person, place, and time. She appears well-developed and well-nourished. No distress.  HENT:  Head: Normocephalic and atraumatic.  Right Ear: Tympanic membrane, external ear and ear canal normal.  Left Ear: Tympanic membrane, external ear and ear canal normal.  Nose: Nose normal. No mucosal edema or rhinorrhea. Right sinus exhibits no maxillary sinus tenderness and no frontal sinus tenderness. Left sinus exhibits no maxillary sinus tenderness and no frontal sinus tenderness.  Mouth/Throat: Uvula is midline, oropharynx is clear and moist and mucous membranes are normal. No oral lesions. Abnormal dentition. Dental caries present. No uvula swelling or lacerations. No oropharyngeal exudate, posterior oropharyngeal edema, posterior oropharyngeal erythema or tonsillar abscesses.  No gingival swelling, fluctuance or induration No gross abscess Cavity noted to tooth #28 with tenderness to palpation, minimal erythema around that tooth. No woody induration to the floor the mouth  Eyes: Conjunctivae are normal. Pupils are equal, round, and reactive to light. Right eye exhibits no discharge. Left eye exhibits no discharge.  Neck: Normal range of motion. Neck supple.  No stridor Handling secretions without difficulty No nuchal rigidity No cervical lymphadenopathy   Cardiovascular: Normal rate, regular rhythm, normal heart sounds and intact distal pulses.   No murmur heard. Pulmonary/Chest: Effort normal and breath sounds normal. No stridor. No respiratory distress. She has no wheezes.  Equal chest rise  Abdominal: Soft. Bowel sounds are normal. She exhibits no distension. There is no tenderness.  Musculoskeletal: Normal range of motion. She exhibits no edema.  Lymphadenopathy:    She has no cervical adenopathy.  Neurological: She is alert and oriented to person, place, and time.  Skin: Skin is warm and dry. Rash noted.  She is not diaphoretic.  Mild excoriations noted to the arms and legs without specific rash - no induration or fluctuance to indicate secondary infection  Psychiatric: She has a normal mood and affect.  Nursing note and vitals reviewed.   ED Course  Procedures (including critical care time) DIAGNOSTIC STUDIES: Oxygen Saturation is 100% on RA, normal by my interpretation.    COORDINATION OF CARE: 4:30 PM Discussed treatment plan with pt which includes hydrocortisone lotion and Clindamycin and pt agreed to plan.   Labs Review Labs Reviewed - No data to display  Imaging Review No results found.   EKG Interpretation None      MDM   Final diagnoses:  Pain due to dental caries  Dental infection  Xerosis of skin  Rash and nonspecific skin eruption   Jeralynn L Romanoff presents with several complaints.  Patient with toothache.  No gross abscess.  Exam unconcerning for Ludwig's angina or spread of infection.  Will treat with clindamycin and pain medicine.  Urged patient to follow-up with dentist.    Patient also with complaints of itching to her arms and legs and rash. Patient without visible rash on exam. Mild excoriations noted throughout. We'll discharge home  with hydrocortisone lotion. Recommend Benadryl for usage if rash appears.  I have personally reviewed patient's vitals, nursing note and any pertinent labs or imaging.  I performed an undressed physical exam.    It has been determined that no acute conditions requiring further emergency intervention are present at this time. The patient/guardian have been advised of the diagnosis and plan. I reviewed all labs and imaging including any potential incidental findings. We have discussed signs and symptoms that warrant return to the ED and they are listed in the discharge instructions.    Vital signs are stable at discharge.   BP 111/64 mmHg  Pulse 75  Temp(Src) 98.7 F (37.1 C) (Oral)  Resp 18  SpO2 100%  LMP  06/15/2014   I personally performed the services described in this documentation, which was scribed in my presence. The recorded information has been reviewed and is accurate.       Jarrett Soho Tierrah Anastos, PA-C 06/22/14 1705  Ernestina Patches, MD 06/23/14 [phone]

## 2014-06-24 DIAGNOSIS — Z5189 Encounter for other specified aftercare: Secondary | ICD-10-CM

## 2014-06-24 HISTORY — DX: Encounter for other specified aftercare: Z51.89

## 2014-08-10 ENCOUNTER — Encounter (HOSPITAL_COMMUNITY): Payer: Self-pay | Admitting: *Deleted

## 2014-08-10 ENCOUNTER — Observation Stay (HOSPITAL_COMMUNITY)
Admission: EM | Admit: 2014-08-10 | Discharge: 2014-08-12 | Disposition: A | Discharge status: Home / Self Care | Payer: 59 | Attending: Internal Medicine | Admitting: Internal Medicine

## 2014-08-10 DIAGNOSIS — N920 Excessive and frequent menstruation with regular cycle: Secondary | ICD-10-CM

## 2014-08-10 DIAGNOSIS — Z91013 Allergy to seafood: Secondary | ICD-10-CM | POA: Insufficient documentation

## 2014-08-10 DIAGNOSIS — F419 Anxiety disorder, unspecified: Secondary | ICD-10-CM | POA: Diagnosis not present

## 2014-08-10 DIAGNOSIS — R55 Syncope and collapse: Principal | ICD-10-CM | POA: Insufficient documentation

## 2014-08-10 DIAGNOSIS — D649 Anemia, unspecified: Secondary | ICD-10-CM | POA: Diagnosis present

## 2014-08-10 DIAGNOSIS — D500 Iron deficiency anemia secondary to blood loss (chronic): Secondary | ICD-10-CM

## 2014-08-10 DIAGNOSIS — R259 Unspecified abnormal involuntary movements: Secondary | ICD-10-CM

## 2014-08-10 DIAGNOSIS — Z880 Allergy status to penicillin: Secondary | ICD-10-CM | POA: Insufficient documentation

## 2014-08-10 DIAGNOSIS — Z79899 Other long term (current) drug therapy: Secondary | ICD-10-CM | POA: Diagnosis not present

## 2014-08-10 DIAGNOSIS — R569 Unspecified convulsions: Secondary | ICD-10-CM

## 2014-08-10 DIAGNOSIS — Z8742 Personal history of other diseases of the female genital tract: Secondary | ICD-10-CM

## 2014-08-10 DIAGNOSIS — Z87891 Personal history of nicotine dependence: Secondary | ICD-10-CM | POA: Diagnosis not present

## 2014-08-10 LAB — RETICULOCYTES
RBC.: 3.93 MIL/uL (ref 3.87–5.11)
RETIC COUNT ABSOLUTE: 59 10*3/uL (ref 19.0–186.0)
RETIC CT PCT: 1.5 % (ref 0.4–3.1)

## 2014-08-10 LAB — PREPARE RBC (CROSSMATCH)

## 2014-08-10 LAB — BASIC METABOLIC PANEL
ANION GAP: 6 (ref 5–15)
BUN: 7 mg/dL (ref 6–23)
CHLORIDE: 103 mmol/L (ref 96–112)
CO2: 25 mmol/L (ref 19–32)
Calcium: 8.8 mg/dL (ref 8.4–10.5)
Creatinine, Ser: 0.77 mg/dL (ref 0.50–1.10)
GFR calc Af Amer: >90 mL/min (ref >90)
GFR calc non Af Amer: >90 mL/min (ref >90)
Glucose, Bld: 100 mg/dL — ABNORMAL HIGH (ref 70–99)
Potassium: 3.1 mmol/L — ABNORMAL LOW (ref 3.5–5.1)
Sodium: 134 mmol/L — ABNORMAL LOW (ref 135–145)

## 2014-08-10 LAB — CBC
HCT: 22.2 % — ABNORMAL LOW (ref 36.0–46.0)
Hemoglobin: 6.4 g/dL — CL (ref 12.0–15.0)
MCH: 15.7 pg — AB (ref 26.0–34.0)
MCHC: 28.8 g/dL — ABNORMAL LOW (ref 30.0–36.0)
MCV: 54.4 fL — ABNORMAL LOW (ref 78.0–100.0)
Platelets: 293 10*3/uL (ref 150–400)
RBC: 4.08 MIL/uL (ref 3.87–5.11)
RDW: 19 % — ABNORMAL HIGH (ref 11.5–15.5)
WBC: 5.9 10*3/uL (ref 4.0–10.5)

## 2014-08-10 LAB — URINALYSIS, ROUTINE W REFLEX MICROSCOPIC
Bilirubin Urine: NEGATIVE
GLUCOSE, UA: NEGATIVE mg/dL
Hgb urine dipstick: NEGATIVE
Ketones, ur: NEGATIVE mg/dL
NITRITE: NEGATIVE
PROTEIN: NEGATIVE mg/dL
Specific Gravity, Urine: 1.007 (ref 1.005–1.030)
UROBILINOGEN UA: 0.2 mg/dL (ref 0.0–1.0)
pH: 6.5 (ref 5.0–8.0)

## 2014-08-10 LAB — URINE MICROSCOPIC-ADD ON

## 2014-08-10 LAB — I-STAT TROPONIN, ED: Troponin i, poc: 0 ng/mL (ref 0.00–0.08)

## 2014-08-10 LAB — POC OCCULT BLOOD, ED: Fecal Occult Bld: NEGATIVE

## 2014-08-10 LAB — ABO/RH: ABO/RH(D): B POS

## 2014-08-10 LAB — PREGNANCY, URINE: Preg Test, Ur: NEGATIVE

## 2014-08-10 MED ORDER — FAMOTIDINE 20 MG PO TABS
20.0000 mg | ORAL_TABLET | Freq: Two times a day (BID) | ORAL | Status: DC
  Administered 2014-08-11 – 2014-08-12 (×3): 20 mg via ORAL
  Filled 2014-08-10 (×5): qty 1

## 2014-08-10 MED ORDER — LORAZEPAM 0.5 MG PO TABS: 0.5000 mg | ORAL_TABLET | Freq: Every evening | ORAL | Status: DC | PRN

## 2014-08-10 MED ORDER — SODIUM CHLORIDE 0.9 % IV SOLN
Freq: Once | INTRAVENOUS | Status: AC
  Administered 2014-08-10: 23:00:00 via INTRAVENOUS

## 2014-08-10 MED ORDER — SODIUM CHLORIDE 0.9 % IV SOLN
Freq: Once | INTRAVENOUS | Status: AC
  Administered 2014-08-10: via INTRAVENOUS

## 2014-08-10 NOTE — ED Notes (Signed)
Pt reports going to dr office yesterday for checkup and was called today to come here due to hgb of 6.5. Pt reports syncopal episode this am and also has right side dental abscess. Airway intact.

## 2014-08-10 NOTE — ED Notes (Signed)
PA Lauren at bedside

## 2014-08-10 NOTE — ED Notes (Signed)
POC Hemicult- Negative

## 2014-08-10 NOTE — ED Provider Notes (Signed)
CSN: [id number]     Arrival date & time 08/10/14  1612 History   First MD Initiated Contact with Patient 08/10/14 1848     Chief Complaint  Patient presents with  . Loss of Consciousness  . Abnormal Lab     (Consider location/radiation/quality/duration/timing/severity/associated sxs/prior Treatment) HPI Comments: The patient is a 29 year old female presents emergency room chief complaint of abnormal lab. Patient reports she was notified by her PCP of her hemoglobin of 6.4 yesterday. The patient reports heavy periods, persistent. Last normal menstrual period 07/29/2013 she reports five-day period. Patient denies known history of fibroids. She denies melanotic or bright red blood per rectum, NSAID use or alcohol use. Patient denies hematuria. Patient has never had a transfusion before. She reports syncopal episode last night. She reports after standing up and ambulating she felt lightheaded and passed out, this was witnessed by her children, 26 year old daughter. The patient denies shortness breath, chest pain.  Patient is a 29 y.o. female presenting with syncope. The history is provided by the patient. No language interpreter was used.  Loss of Consciousness Associated symptoms: no chest pain, no fever and no shortness of breath     Past Medical History  Diagnosis Date  . Menorrhagia   . Anemia   . Anxiety    Past Surgical History  Procedure Laterality Date  . Cholecystectomy    . Tubal ligation     Family History  Problem Relation Age of Onset  . Diabetes Father    History  Substance Use Topics  . Smoking status: Former Smoker    Quit date: 05/13/2009  . Smokeless tobacco: Never Used  . Alcohol Use: 0.6 oz/week    1 Glasses of wine per week     Comment: occaional   OB History    Gravida Para Term Preterm AB TAB SAB Ectopic Multiple Living   5 4 2 2 1  1   4      Review of Systems  Constitutional: Negative for fever and chills.  Respiratory: Negative for shortness of  breath.   Cardiovascular: Positive for syncope. Negative for chest pain.  Neurological: Positive for syncope and light-headedness.      Allergies  Penicillins and Shrimp  Home Medications   Prior to Admission medications   Medication Sig Start Date End Date Taking? Authorizing Provider  acetaminophen (TYLENOL) 500 MG tablet Take 1,000 mg by mouth daily as needed for moderate pain.    Historical Provider, MD  clindamycin (CLEOCIN) 150 MG capsule Take 3 capsules (450 mg total) by mouth 3 (three) times daily. 06/22/14   Hannah Muthersbaugh, PA-C  fluconazole (DIFLUCAN) 150 MG tablet Take 1 tablet (150 mg total) by mouth daily. 06/03/14   Olegario Messier, NP  HYDROcodone-acetaminophen (NORCO/VICODIN) 5-325 MG per tablet Take 1-2 tablets by mouth every 6 (six) hours as needed for moderate pain or severe pain. 06/22/14   Hannah Muthersbaugh, PA-C  hydrocortisone 2.5 % lotion Apply topically 2 (two) times daily. 06/22/14   Hannah Muthersbaugh, PA-C  ibuprofen (ADVIL,MOTRIN) 200 MG tablet Take 400 mg by mouth every 6 (six) hours as needed for mild pain.    Historical Provider, MD  ranitidine (ZANTAC) 150 MG tablet Take 150 mg by mouth daily.    Historical Provider, MD  sulfamethoxazole-trimethoprim (SEPTRA DS) 800-160 MG per tablet Take 1 tablet by mouth every 12 (twelve) hours. 06/03/14   Olegario Messier, NP   BP 104/50 mmHg  Pulse 93  Temp(Src) 98.2 F (36.8 C) (Oral)  Resp  20  SpO2 97%  LMP 07/29/2014 Physical Exam  Constitutional: She is oriented to person, place, and time. She appears well-developed and well-nourished. No distress.  HENT:  Head: Normocephalic and atraumatic.  Eyes: EOM are normal.  Pale conjunctiva  Cardiovascular: Normal rate and regular rhythm.   Pulmonary/Chest: Effort normal. She has no wheezes. She has no rales.  Abdominal: Soft. There is no tenderness. There is no rebound and no guarding.  Genitourinary: Rectal exam shows no external hemorrhoid and no  fissure. Guaiac negative stool.  No stool in rectal vault. Chaperone present.  Musculoskeletal: Normal range of motion.  Neurological: She is alert and oriented to person, place, and time.  Skin: Skin is warm and dry.  Psychiatric: She has a normal mood and affect. Her behavior is normal.  Nursing note and vitals reviewed.   ED Course  Procedures (including critical care time) Labs Review Labs Reviewed  CBC - Abnormal; Notable for the following:    Hemoglobin 6.4 (*)    HCT 22.2 (*)    MCV 54.4 (*)    MCH 15.7 (*)    MCHC 28.8 (*)    RDW 19.0 (*)    All other components within normal limits  BASIC METABOLIC PANEL - Abnormal; Notable for the following:    Sodium 134 (*)    Potassium 3.1 (*)    Glucose, Bld 100 (*)    All other components within normal limits  URINALYSIS, ROUTINE W REFLEX MICROSCOPIC - Abnormal; Notable for the following:    APPearance CLOUDY (*)    Leukocytes, UA SMALL (*)    All other components within normal limits  URINE MICROSCOPIC-ADD ON - Abnormal; Notable for the following:    Squamous Epithelial / LPF MANY (*)    Bacteria, UA FEW (*)    All other components within normal limits  RETICULOCYTES  PREGNANCY, URINE  OCCULT BLOOD X 1 CARD TO LAB, STOOL  VITAMIN B12  FOLATE  IRON AND TIBC  FERRITIN  I-STAT TROPOININ, ED  POC OCCULT BLOOD, ED  TYPE AND SCREEN  ABO/RH  PREPARE RBC (CROSSMATCH)    Imaging Review No results found.   EKG Interpretation None      MDM   Final diagnoses:  Symptomatic anemia  History of menorrhagia   Patient presents with symptomatic anemia hemoglobin 6.4. Plan to admit for blood transfusion and further evaluation of her anemia. Plan to transfuse the patient and admit for symptomatic anemia. Discussed with Dr. Marin Comment who agrees to evaluate the patient in the ED.   Harvie Heck, PA-C 08/10/14 0923  Ezequiel Essex, MD 08/10/14 [phone]

## 2014-08-10 NOTE — ED Notes (Signed)
Critical Hbg value 6.4. Given to PA Lauren.

## 2014-08-10 NOTE — H&P (Signed)
[facility] History and Physical  YOLETTE HASTINGS CBJ:[id number] DOB: May 21, 1986    PCP:   No PCP Per Patient   Chief Complaint: syncope and symptomatic anemia.   HPI: Breanna Miranda is an 29 y.o. female with 4 children, s/p tubal ligation, hx of menorrhagia, anemia, presented to the ER with recurrent syncopes.  She was at her PCP office yesterday, and was found to have Hb of 6.4 grams per dL.   She hadn't had recent CBC, but prior, her Hb was around 9 grams per dL.  She denied rectal bleeding, melena, or epistaxis.  She has no abdominal pain, and admitted that she has had menorrhagia, though she is not having her period at this time.  She doesn't have a gyn currently.  Evaluation in the ER included a negative brown stool guaic, Hb of 6.5 g per dL, and BUN of 7.  She was started on the first PRBC, and hospitalist was asked to admit her for further transfusion.  When I saw her, her HR was 80, and her BP 120/70.  She conversed meaningfully and was in NAD.   Rewiew of Systems:  Constitutional: Negative for malaise, fever and chills. No significant weight loss or weight gain Eyes: Negative for eye pain, redness and discharge, diplopia, visual changes, or flashes of light. ENMT: Negative for ear pain, hoarseness, nasal congestion, sinus pressure and sore throat. No headaches; tinnitus, drooling, or problem swallowing. Cardiovascular: Negative for chest pain, palpitations, diaphoresis, dyspnea and peripheral edema. ; No orthopnea, PND Respiratory: Negative for cough, hemoptysis, wheezing and stridor. No pleuritic chestpain. Gastrointestinal: Negative for nausea, vomiting, diarrhea, constipation, abdominal pain, melena, blood in stool, hematemesis, jaundice and rectal bleeding.    Genitourinary: Negative for frequency, dysuria, incontinence,flank pain and hematuria; Musculoskeletal: Negative for back pain and neck pain. Negative for swelling and trauma.;  Skin: . Negative for pruritus,  rash, abrasions, bruising and skin lesion.; ulcerations Neuro: Negative for headache,  and neck stiffness. Negative for weakness, altered level of consciousness , altered mental status, extremity weakness, burning feet, involuntary movement, seizure  Psych: negative for anxiety, depression, insomnia, tearfulness, panic attacks, hallucinations, paranoia, suicidal or homicidal ideation    Past Medical History  Diagnosis Date  . Menorrhagia   . Anemia   . Anxiety     Past Surgical History  Procedure Laterality Date  . Cholecystectomy    . Tubal ligation      Medications:  HOME MEDS: Prior to Admission medications   Medication Sig Start Date End Date Taking? Authorizing Provider  acetaminophen (TYLENOL) 500 MG tablet Take 1,000 mg by mouth daily as needed for moderate pain.   Yes Historical Provider, MD  HYDROcodone-acetaminophen (NORCO/VICODIN) 5-325 MG per tablet Take 1-2 tablets by mouth every 6 (six) hours as needed for moderate pain or severe pain. 06/22/14  Yes Hannah Muthersbaugh, PA-C  LORazepam (ATIVAN) 0.5 MG tablet Take 0.5 mg by mouth at bedtime as needed for sleep.   Yes Historical Provider, MD  Phenylephrine-Pheniramine-DM 04-13-19 MG PACK Take 1 Package by mouth as needed (for cold).   Yes Historical Provider, MD  ranitidine (ZANTAC) 150 MG tablet Take 150 mg by mouth daily.   Yes Historical Provider, MD  clindamycin (CLEOCIN) 150 MG capsule Take 3 capsules (450 mg total) by mouth 3 (three) times daily. 06/22/14   Hannah Muthersbaugh, PA-C  fluconazole (DIFLUCAN) 150 MG tablet Take 1 tablet (150 mg total) by mouth daily. 06/03/14   Olegario Messier, NP  hydrocortisone 2.5 %  lotion Apply topically 2 (two) times daily. 06/22/14   Hannah Muthersbaugh, PA-C  ibuprofen (ADVIL,MOTRIN) 200 MG tablet Take 400 mg by mouth every 6 (six) hours as needed for mild pain.    Historical Provider, MD  sulfamethoxazole-trimethoprim (SEPTRA DS) 800-160 MG per tablet Take 1 tablet by mouth  every 12 (twelve) hours. 06/03/14   Olegario Messier, NP     Allergies:  Allergies  Allergen Reactions  . Penicillins Anaphylaxis, Itching and Swelling    Swelling -tongue  . Shrimp [Shellfish Allergy] Anaphylaxis, Hives and Swelling    Social History:   reports that she quit smoking about 5 years ago. She has never used smokeless tobacco. She reports that she drinks about 0.6 oz of alcohol per week. She reports that she does not use illicit drugs.  Family History: Family History  Problem Relation Age of Onset  . Diabetes Father      Physical Exam: Filed Vitals:   08/10/14 2100 08/10/14 2115 08/10/14 2115 08/10/14 2130  BP: 104/71 96/63 96/63  107/70  Pulse: 82 82 81 103  Temp:   97.9 F (36.6 C)   TempSrc:   Oral   Resp: 13 17 19 18   SpO2: 100% 100% 100% 100%   Blood pressure 107/70, pulse 103, temperature 97.9 F (36.6 C), temperature source Oral, resp. rate 18, last menstrual period 07/29/2014, SpO2 100 %.  GEN:  Pleasant  patient lying in the stretcher in no acute distress; cooperative with exam. PSYCH:  alert and oriented x4; does not appear anxious or depressed; affect is appropriate. HEENT: Mucous membranes pink and anicteric; PERRLA; EOM intact; no cervical lymphadenopathy nor thyromegaly or carotid bruit; no JVD; There were no stridor. Neck is very supple. Breasts:: Not examined CHEST WALL: No tenderness CHEST: Normal respiration, clear to auscultation bilaterally.  HEART: Regular rate and rhythm.  There are no murmur, rub, or gallops.   BACK: No kyphosis or scoliosis; no CVA tenderness ABDOMEN: soft and non-tender; no masses, no organomegaly, normal abdominal bowel sounds; no pannus; no intertriginous candida. There is no rebound and no distention. Rectal Exam: Not done EXTREMITIES: No bone or joint deformity; age-appropriate arthropathy of the hands and knees; no edema; no ulcerations.  There is no calf tenderness. Genitalia: not examined PULSES: 2+ and  symmetric SKIN: Normal hydration no rash or ulceration CNS: Cranial nerves 2-12 grossly intact no focal lateralizing neurologic deficit.  Speech is fluent; uvula elevated with phonation, facial symmetry and tongue midline. DTR are normal bilaterally, cerebella exam is intact, barbinski is negative and strengths are equaled bilaterally.  No sensory loss.   Labs on Admission:  Basic Metabolic Panel:  Recent Labs Lab 08/10/14 1653  NA 134*  K 3.1*  CL 103  CO2 25  GLUCOSE 100*  BUN 7  CREATININE 0.77  CALCIUM 8.8    Recent Labs Lab 08/10/14 1653  WBC 5.9  HGB 6.4*  HCT 22.2*  MCV 54.4*  PLT 293   Assessment/Plan Present on Admission:  . Anemia . Menorrhagia . Anxiety  PLAN:  Will admit her OBS and give her a total of 2 units of PRBCs.  She can be discharged tomorrow if her Hb is close to 9 grams.  Outpatient, she will need to see her GYN for definitive treatment of her menorrhagia.  In the future, she should take iron with Vit C pending her labs, but I suspect she is iron deficient.  She is stable, full code, and will be admitted OBS to hospitalist service.  Thank you for allowing me to participate in her care.   Other plans as per orders.  Code Status: FULL Haskel Khan, MD. [facility] Pager [phone] 7pm to 7am.  08/10/2014, 9:44 PM

## 2014-08-11 ENCOUNTER — Observation Stay (HOSPITAL_COMMUNITY): Payer: 59

## 2014-08-11 DIAGNOSIS — N921 Excessive and frequent menstruation with irregular cycle: Secondary | ICD-10-CM

## 2014-08-11 DIAGNOSIS — E876 Hypokalemia: Secondary | ICD-10-CM

## 2014-08-11 DIAGNOSIS — R55 Syncope and collapse: Principal | ICD-10-CM

## 2014-08-11 LAB — IRON AND TIBC
Iron: <10 ug/dL — ABNORMAL LOW (ref 42–145)
UIBC: 491 ug/dL — AB (ref 125–400)

## 2014-08-11 LAB — FOLATE: FOLATE: 17.4 ng/mL

## 2014-08-11 LAB — HEMATOCRIT: HCT: 28.6 % — ABNORMAL LOW (ref 36.0–46.0)

## 2014-08-11 LAB — TSH: TSH: 1.047 u[IU]/mL (ref 0.350–4.500)

## 2014-08-11 LAB — HEMOGLOBIN: Hemoglobin: 8.5 g/dL — ABNORMAL LOW (ref 12.0–15.0)

## 2014-08-11 LAB — FERRITIN: Ferritin: 5 ng/mL — ABNORMAL LOW (ref 10–291)

## 2014-08-11 LAB — VITAMIN B12: VITAMIN B 12: 731 pg/mL (ref 211–911)

## 2014-08-11 MED ORDER — LORAZEPAM 2 MG/ML IJ SOLN
1.0000 mg | Freq: Once | INTRAMUSCULAR | Status: AC
  Administered 2014-08-11: 1 mg via INTRAVENOUS

## 2014-08-11 MED ORDER — SODIUM CHLORIDE 0.9 % IV SOLN
25.0000 mg | Freq: Once | INTRAVENOUS | Status: AC
  Administered 2014-08-11: 25 mg via INTRAVENOUS
  Filled 2014-08-11: qty 0.5

## 2014-08-11 MED ORDER — LORAZEPAM 2 MG/ML IJ SOLN
INTRAMUSCULAR | Status: AC
  Filled 2014-08-11: qty 1

## 2014-08-11 MED ORDER — ACETAMINOPHEN 325 MG PO TABS: 650.0000 mg | ORAL_TABLET | Freq: Four times a day (QID) | ORAL | Status: DC | PRN

## 2014-08-11 MED ORDER — POTASSIUM CHLORIDE CRYS ER 20 MEQ PO TBCR
40.0000 meq | EXTENDED_RELEASE_TABLET | Freq: Once | ORAL | Status: AC
  Administered 2014-08-11: 40 meq via ORAL
  Filled 2014-08-11: qty 2

## 2014-08-11 MED ORDER — GADOBENATE DIMEGLUMINE 529 MG/ML IV SOLN
20.0000 mL | Freq: Once | INTRAVENOUS | Status: AC | PRN
  Administered 2014-08-11: 19 mL via INTRAVENOUS

## 2014-08-11 MED ORDER — SODIUM CHLORIDE 0.9 % IV SOLN
500.0000 mg | Freq: Every day | INTRAVENOUS | Status: AC
  Administered 2014-08-11 – 2014-08-12 (×2): 500 mg via INTRAVENOUS
  Filled 2014-08-11 (×4): qty 10

## 2014-08-11 NOTE — Consult Note (Signed)
NEURO HOSPITALIST CONSULT NOTE    Reason for Consult: syncope versus seizure  HPI:                                                                                                                                          Breanna Miranda is an 29 y.o. female with long history of menorrhagia and anemia with recurrent syncopal episodes.  She feels her episodes are linked to when she is anemic.  Patient was admitted to hospital secondary to Hg 6.4.  She tells me she has had a 4 year history of passing out.  Often it occurs at night and can occur while standing or laying down.  She gets a sensation as if she can hear herself breathing heavily and at times feels a "flip-flopping of her heart in her chest then feels as though she cannot catch her breath".  She states her husband has witnessed her passing out and "jerking for a few minutes" and her daughter has witness the same in addition to hearing her gasp. She denies urinary incontinence or tongue biting. She feels she has never had a prolonged period of confusion but has fallen and hurt herself.  She denies any head trauma, or family history of seizures.  While hospitilized she has had no further events.  Her BP has been soft systolic 46-962.  Past Medical History  Diagnosis Date  . Menorrhagia   . Anemia   . Anxiety     Past Surgical History  Procedure Laterality Date  . Cholecystectomy    . Tubal ligation      Family History  Problem Relation Age of Onset  . Diabetes Father      Social History:  reports that she quit smoking about 5 years ago. She has never used smokeless tobacco. She reports that she drinks about 0.6 oz of alcohol per week. She reports that she does not use illicit drugs.  Allergies  Allergen Reactions  . Penicillins Anaphylaxis, Itching and Swelling    Swelling -tongue  . Shrimp [Shellfish Allergy] Anaphylaxis, Hives and Swelling    MEDICATIONS:  Prior to Admission:  Prescriptions prior to admission  Medication Sig Dispense Refill Last Dose  . acetaminophen (TYLENOL) 500 MG tablet Take 1,000 mg by mouth daily as needed for moderate pain.   Past Week at Unknown time  . HYDROcodone-acetaminophen (NORCO/VICODIN) 5-325 MG per tablet Take 1-2 tablets by mouth every 6 (six) hours as needed for moderate pain or severe pain. 15 tablet 0 08/09/2014 at Unknown time  . LORazepam (ATIVAN) 0.5 MG tablet Take 0.5 mg by mouth at bedtime as needed for sleep.   Past Week at Unknown time  . Phenylephrine-Pheniramine-DM 04-13-19 MG PACK Take 1 Package by mouth as needed (for cold).   Past Week at Unknown time  . ranitidine (ZANTAC) 150 MG tablet Take 150 mg by mouth daily.   08/10/2014 at Unknown time  . clindamycin (CLEOCIN) 150 MG capsule Take 3 capsules (450 mg total) by mouth 3 (three) times daily. 90 capsule 0   . fluconazole (DIFLUCAN) 150 MG tablet Take 1 tablet (150 mg total) by mouth daily. 1 tablet 1   . hydrocortisone 2.5 % lotion Apply topically 2 (two) times daily. 59 mL 0   . ibuprofen (ADVIL,MOTRIN) 200 MG tablet Take 400 mg by mouth every 6 (six) hours as needed for mild pain.   06/03/2014 at Unknown time  . sulfamethoxazole-trimethoprim (SEPTRA DS) 800-160 MG per tablet Take 1 tablet by mouth every 12 (twelve) hours. 10 tablet 0    Scheduled: . famotidine  20 mg Oral BID  . iron dextran (INFED/DEXFERRUM) infusion  25 mg Intravenous Once   Followed by  . iron dextran (INFED/DEXFERRUM) infusion  500 mg Intravenous Daily     ROS:                                                                                                                                       History obtained from the patient  General ROS: negative for - chills, fatigue, fever, night sweats, weight gain or weight loss Psychological ROS: negative for - behavioral disorder, hallucinations, memory  difficulties, mood swings or suicidal ideation Ophthalmic ROS: negative for - blurry vision, double vision, eye pain or loss of vision ENT ROS: negative for - epistaxis, nasal discharge, oral lesions, sore throat, tinnitus or vertigo Allergy and Immunology ROS: negative for - hives or itchy/watery eyes Hematological and Lymphatic ROS: negative for - bleeding problems, bruising or swollen lymph nodes Endocrine ROS: negative for - galactorrhea, hair pattern changes, polydipsia/polyuria or temperature intolerance Respiratory ROS: negative for - cough, hemoptysis, shortness of breath or wheezing Cardiovascular ROS: negative for - chest pain, dyspnea on exertion, edema or irregular heartbeat Gastrointestinal ROS: negative for - abdominal pain, diarrhea, hematemesis, nausea/vomiting or stool incontinence Genito-Urinary ROS: negative for - dysuria, hematuria, incontinence or urinary frequency/urgency Musculoskeletal ROS: negative for - joint swelling or muscular weakness Neurological ROS: as noted in HPI Dermatological ROS: negative for rash and skin lesion  changes   Blood pressure 100/72, pulse 71, temperature 97.8 F (36.6 C), temperature source Oral, resp. rate 20, last menstrual period 07/29/2014, SpO2 100 %.   Neurologic Examination:                                                                                                      HEENT-  Normocephalic, no lesions, without obvious abnormality.  Normal external eye and conjunctiva.  Normal TM's bilaterally.  Normal auditory canals and external ears. Normal external nose, mucus membranes and septum.  Normal pharynx. Cardiovascular- S1, S2 normal, pulses palpable throughout   Lungs- chest clear, no wheezing, rales, normal symmetric air entry Abdomen- normal findings: bowel sounds normal Extremities- no edema Lymph-no adenopathy palpable Musculoskeletal-no joint tenderness, deformity or swelling Skin-warm and dry, no hyperpigmentation,  vitiligo, or suspicious lesions  Neurological Examination Mental Status: Alert, oriented, thought content appropriate.  Speech fluent without evidence of aphasia.  Able to follow 3 step commands without difficulty. Cranial Nerves: II: Discs flat bilaterally; Visual fields grossly normal, pupils equal, round, reactive to light and accommodation III,IV, VI: ptosis not present, extra-ocular motions intact bilaterally V,VII: smile symmetric, facial light touch sensation normal bilaterally VIII: hearing normal bilaterally IX,X: gag reflex present XI: bilateral shoulder shrug XII: midline tongue extension Motor: Right : Upper extremity   5/5    Left:     Upper extremity   5/5  Lower extremity   5/5     Lower extremity   5/5 Tone and bulk:normal tone throughout; no atrophy noted Sensory: Pinprick and light touch intact throughout, bilaterally Deep Tendon Reflexes: 2+ and symmetric throughout Plantars: Right: downgoing   Left: downgoing Cerebellar: normal finger-to-nose, normal rapid alternating movements and normal heel-to-shin test Gait: normal gait and station      Lab Results: Basic Metabolic Panel:  Recent Labs Lab 08/10/14 1653  NA 134*  K 3.1*  CL 103  CO2 25  GLUCOSE 100*  BUN 7  CREATININE 0.77  CALCIUM 8.8    Liver Function Tests: No results for input(s): AST, ALT, ALKPHOS, BILITOT, PROT, ALBUMIN in the last 168 hours. No results for input(s): LIPASE, AMYLASE in the last 168 hours. No results for input(s): AMMONIA in the last 168 hours.  CBC:  Recent Labs Lab 08/10/14 1653 08/11/14 0644  WBC 5.9  --   HGB 6.4* 8.5*  HCT 22.2* 28.6*  MCV 54.4*  --   PLT 293  --     Cardiac Enzymes: No results for input(s): CKTOTAL, CKMB, CKMBINDEX, TROPONINI in the last 168 hours.  Lipid Panel: No results for input(s): CHOL, TRIG, HDL, CHOLHDL, VLDL, LDLCALC in the last 168 hours.  CBG: No results for input(s): GLUCAP in the last 168  hours.  Microbiology: Results for orders placed or performed during the hospital encounter of 06/03/14  Urine culture     Status: None   Collection Time: 06/03/14  7:27 AM  Result Value Ref Range Status   Specimen Description URINE, CLEAN CATCH  Final   Special Requests Normal  Final   Culture  Setup Time  Final    06/03/2014 14:18 Performed at [facility]   Final    95,000 COLONIES/ML Performed at [facility]    Culture   Final    Multiple bacterial morphotypes present, none predominant. Suggest appropriate recollection if clinically indicated. Performed at [facility]    Report Status 06/04/2014 FINAL  Final  Wet prep, genital     Status: Abnormal   Collection Time: 06/03/14  8:22 AM  Result Value Ref Range Status   Yeast Wet Prep HPF POC FEW (A) NONE SEEN Final   Trich, Wet Prep NONE SEEN NONE SEEN Final   Clue Cells Wet Prep HPF POC FEW (A) NONE SEEN Final   WBC, Wet Prep HPF POC FEW (A) NONE SEEN Final    Comment: FEW BACTERIA SEEN  GC/Chlamydia Probe Amp (multiple spec sources)     Status: None   Collection Time: 06/03/14  8:22 AM  Result Value Ref Range Status   CT Probe RNA NEGATIVE NEGATIVE Final   GC Probe RNA NEGATIVE NEGATIVE Final    Comment: (NOTE)                                                                                       **Normal Reference Range: Negative**      Assay performed using the Gen-Probe APTIMA COMBO2 (R) Assay. Acceptable specimen types for this assay include APTIMA Swabs (Unisex, endocervical, urethral, or vaginal), first void urine, and ThinPrep liquid based cytology samples. Performed at [facility]     Coagulation Studies: No results for input(s): LABPROT, INR in the last 72 hours.  Imaging: No results found.     Assessment and plan per attending neurologist  Etta Quill PA-C [facility] [phone]  08/11/2014, 12:44 PM   Assessment/Plan:  29 YO  female with 4 year history of multiple syncopal episodes. Admitted to hospital due to significant anemia with Hg 6.0 and syncopal event.  Her events are described as light headed, sensation of heart palpitation, SOB with pre-syncope and at time syncope with no post ictal period.  Exam is non focal. Likely syncope with possible cardiogenic source,  but will order EEG and MRI to rule out seizure disorder.   Recommend: 1) EEG 2) MRI brain with and without contrast.  3) Telemetry while in hospital and Holter monitor as out patient.  4) Echo 5) TSH  I personally participated in this patient's evaluation and management, including formulating the above clinical assessment and management recommendations.  Rush Farmer M.D. [facility] [phone]

## 2014-08-11 NOTE — Progress Notes (Signed)
EEG Completed; Results Pending  

## 2014-08-11 NOTE — Progress Notes (Signed)
UR completed 

## 2014-08-11 NOTE — Procedures (Signed)
EEG report.   Brief clinical history: 29 YO female with 4 year history of multiple syncopal episodes. Admitted to hospital due to significant anemia with Hg 6.0 and syncopal event. Her events are described as light headed, sensation of heart palpitation, SOB with pre-syncope and at time syncope with no post ictal period.  Technique: this is a 17 channel routine scalp EEG performed at the bedside with bipolar and monopolar montages arranged in accordance to the international 10/20 system of electrode placement. One channel was dedicated to EKG recording.  The study was performed during wakefulness, drowsiness, and stage 2 sleep. Hyperventilation and intermittent photic stimulation were utilized as activating procedures.  Description:In the wakeful state, the best background consisted of a medium amplitude, posterior dominant, well sustained, symmetric and reactive 12 Hz rhythm. Drowsiness demonstrated dropout of the alpha rhythm. Stage 2 sleep showed symmetric and synchronous sleep spindles without intermixed epileptiform discharges. Hyperventilation did not provoke epileptiform discharges. Intermittent photic stimulation did induce a normal driving response.  No focal or generalized epileptiform discharges noted.  No pathologic areas of slowing seen.  EKG channel was not functioning properly.  Impression: this is a normal awake and asleep EEG. Please, be aware that a normal EEG does not exclude the possibility of epilepsy.  Clinical correlation is advised.  Breanna Pod, MD

## 2014-08-11 NOTE — Progress Notes (Signed)
PROGRESS NOTE    Breanna Miranda ZJQ:[id number] DOB: 09-Feb-1986 DOA: 08/10/2014 PCP: No PCP Per Patient  HPI/Brief narrative 29 year old female patient who has 4 children, status post tubal ligation, long history of menorrhagia for which she has not sought attention, anemia and recurrent syncope, seen by PCP day prior to admission and advised to come to ED for hemoglobin of 6.4 g per DL. She gives a 4 year history of frequent passing out spells (10 episodes in 2015 and 2 episodes thus far in 2016). Most of these episodes are in upright position. Some of these episodes are associated with involuntary movement-"seizure-like" as per family members/witnesses. Evaluation in the ER included a negative brown stool guaic, Hb of 6.5 g per dL, and BUN of 7.    Assessment/Plan:  1. Severe iron deficiency anemia secondary to menorrhagia: Status post 2 units of PRBCs and hemoglobin improved from 6.4 > 8.5. Serum iron <10 and ferritin 5. Will provide dose of IV iron to replenish stores. Will need definitive management of menorrhagia. 2. Menorrhagia: Discussed with GYN M.D. on call Dr. Purvis Kilts who will assist with outpatient appointment for consultation. 3. Recurrent syncope versus some seizures: Most likely related to problem #1. Check orthostatic blood pressures. Monitor on telemetry. Check 2-D echo. Neurology consulted and recommend obtaining EEG and MRI brain without contrast. TSH normal. 4. Hypokalemia: Replace. Follow BMP in a.m. 5. History of anxiety: When necessary Ativan   Code Status: Full Family Communication: Discussed with spouse at bedside. Disposition Plan: Home possibly 2/19   Consultants:  Neurology  Procedures:  None  Antibiotics:  None   Subjective: States that she feels better and stronger. Gives a chronic history of dizziness, lightheadedness, DOE with activity. No reported chest pain. Occasional palpitations. Multiple episodes of passing out-last on 08/06/14  when she fell in her living room, did not sustain injuries and told to have shaking seizure-like activity by her 14 and 95-year-old kids. No urinary incontinence. LMP 07/29/14.   Objective: Filed Vitals:   08/10/14 2300 08/10/14 2345 08/11/14 0115 08/11/14 0630  BP: 102/53 103/61 102/64 100/72  Pulse:  77 73 71  Temp: 97.8 F (36.6 C) 97.9 F (36.6 C) 98.2 F (36.8 C) 97.8 F (36.6 C)  TempSrc: Oral Oral Oral Oral  Resp: 18 18 16 20   SpO2: 87% 100% 100% 100%    Intake/Output Summary (Last 24 hours) at 08/11/14 1148 Last data filed at 08/11/14 0900  Gross per 24 hour  Intake    858 ml  Output      0 ml  Net    858 ml   There were no vitals filed for this visit.   Exam:  General exam: pleasant young female lying comfortably supine in bed.  Respiratory system: Clear. No increased work of breathing. Cardiovascular system: S1 & S2 heard, RRR. No JVD, murmurs, gallops, clicks or pedal edema. Gastrointestinal system: Abdomen is nondistended, soft and nontender. Normal bowel sounds heard. Central nervous system: Alert and oriented. No focal neurological deficits. Extremities: Symmetric 5 x 5 power.   Data Reviewed: Basic Metabolic Panel:  Recent Labs Lab 08/10/14 1653  NA 134*  K 3.1*  CL 103  CO2 25  GLUCOSE 100*  BUN 7  CREATININE 0.77  CALCIUM 8.8   Liver Function Tests: No results for input(s): AST, ALT, ALKPHOS, BILITOT, PROT, ALBUMIN in the last 168 hours. No results for input(s): LIPASE, AMYLASE in the last 168 hours. No results for input(s): AMMONIA in the last 168  hours. CBC:  Recent Labs Lab 08/10/14 1653 08/11/14 0644  WBC 5.9  --   HGB 6.4* 8.5*  HCT 22.2* 28.6*  MCV 54.4*  --   PLT 293  --    Cardiac Enzymes: No results for input(s): CKTOTAL, CKMB, CKMBINDEX, TROPONINI in the last 168 hours. BNP (last 3 results) No results for input(s): PROBNP in the last 8760 hours. CBG: No results for input(s): GLUCAP in the last 168 hours.  No results  found for this or any previous visit (from the past 240 hour(s)).         Studies: No results found.      Scheduled Meds: . famotidine  20 mg Oral BID   Continuous Infusions:   Principal Problem:   Anemia Active Problems:   Menorrhagia   Anxiety    Time spent: 40 minutes.    Vernell Leep, MD, FACP, FHM. [facility] Pager [phone]  If 7PM-7AM, please contact night-coverage [email or web address] Password [facility] 08/11/2014, 11:48 AM

## 2014-08-11 NOTE — Progress Notes (Signed)
MEDICATION RELATED CONSULT NOTE - INITIAL   Pharmacy Consult for IV iron Indication: iron deficiency anemia  Allergies  Allergen Reactions  . Penicillins Anaphylaxis, Itching and Swelling    Swelling -tongue  . Shrimp [Shellfish Allergy] Anaphylaxis, Hives and Swelling    Patient Measurements:   87 kg Adjusted Body Weight: 68 kg  Vital Signs: Temp: 97.8 F (36.6 C) (02/18 0630) Temp Source: Oral (02/18 0630) BP: 100/72 mmHg (02/18 0630) Pulse Rate: 71 (02/18 0630) Intake/Output from previous day: 02/17 0701 - 02/18 0700 In: 29 [Blood:618] Out: -  Intake/Output from this shift: Total I/O In: 240 [P.O.:240] Out: -   Labs:  Recent Labs  08/10/14 1653 08/11/14 0644  WBC 5.9  --   HGB 6.4* 8.5*  HCT 22.2* 28.6*  PLT 293  --   CREATININE 0.77  --    CrCl cannot be calculated (Unknown ideal weight.).   Microbiology: No results found for this or any previous visit (from the past 720 hour(s)).  Medical History: Past Medical History  Diagnosis Date  . Menorrhagia   . Anemia   . Anxiety     Medications:  Prescriptions prior to admission  Medication Sig Dispense Refill Last Dose  . acetaminophen (TYLENOL) 500 MG tablet Take 1,000 mg by mouth daily as needed for moderate pain.   Past Week at Unknown time  . HYDROcodone-acetaminophen (NORCO/VICODIN) 5-325 MG per tablet Take 1-2 tablets by mouth every 6 (six) hours as needed for moderate pain or severe pain. 15 tablet 0 08/09/2014 at Unknown time  . LORazepam (ATIVAN) 0.5 MG tablet Take 0.5 mg by mouth at bedtime as needed for sleep.   Past Week at Unknown time  . Phenylephrine-Pheniramine-DM 04-13-19 MG PACK Take 1 Package by mouth as needed (for cold).   Past Week at Unknown time  . ranitidine (ZANTAC) 150 MG tablet Take 150 mg by mouth daily.   08/10/2014 at Unknown time  . clindamycin (CLEOCIN) 150 MG capsule Take 3 capsules (450 mg total) by mouth 3 (three) times daily. 90 capsule 0   . fluconazole (DIFLUCAN)  150 MG tablet Take 1 tablet (150 mg total) by mouth daily. 1 tablet 1   . hydrocortisone 2.5 % lotion Apply topically 2 (two) times daily. 59 mL 0   . ibuprofen (ADVIL,MOTRIN) 200 MG tablet Take 400 mg by mouth every 6 (six) hours as needed for mild pain.   06/03/2014 at Unknown time  . sulfamethoxazole-trimethoprim (SEPTRA DS) 800-160 MG per tablet Take 1 tablet by mouth every 12 (twelve) hours. 10 tablet 0     Assessment: 29 y/o female with severe iron deficiency anemia due to menorrhagia admitted for syncope. Pharmacy consulted to begin IV iron. I spoke with Dr. Algis Liming and will proceed with test dose of IV iron dextran followed by replacement if no infusion related reaction. Estimated iron deficit is ~1000 mg.  Goal of Therapy:  Hb >14  Plan:  Iron dextran 25 mg IV test dose If no reaction, iron dextran 500 mg IV daily for 2 days Consider oral replacement to start after IV replacement  [facility], [place].D., BCPS Clinical Pharmacist Pager: [phone] 08/11/2014 12:02 PM

## 2014-08-12 DIAGNOSIS — D509 Iron deficiency anemia, unspecified: Secondary | ICD-10-CM

## 2014-08-12 DIAGNOSIS — R55 Syncope and collapse: Secondary | ICD-10-CM

## 2014-08-12 LAB — CBC
HCT: 30.5 % — ABNORMAL LOW (ref 36.0–46.0)
Hemoglobin: 9 g/dL — ABNORMAL LOW (ref 12.0–15.0)
MCH: 17.8 pg — ABNORMAL LOW (ref 26.0–34.0)
MCHC: 29.5 g/dL — ABNORMAL LOW (ref 30.0–36.0)
MCV: 60.2 fL — ABNORMAL LOW (ref 78.0–100.0)
PLATELETS: 326 10*3/uL (ref 150–400)
RBC: 5.07 MIL/uL (ref 3.87–5.11)
RDW: 25.5 % — ABNORMAL HIGH (ref 11.5–15.5)
WBC: 5.4 10*3/uL (ref 4.0–10.5)

## 2014-08-12 LAB — COMPREHENSIVE METABOLIC PANEL
ALT: 15 U/L (ref 0–35)
AST: 25 U/L (ref 0–37)
Albumin: 3.6 g/dL (ref 3.5–5.2)
Alkaline Phosphatase: 56 U/L (ref 39–117)
Anion gap: 8 (ref 5–15)
BUN: <5 mg/dL — ABNORMAL LOW (ref 6–23)
CHLORIDE: 106 mmol/L (ref 96–112)
CO2: 23 mmol/L (ref 19–32)
Calcium: 9.3 mg/dL (ref 8.4–10.5)
Creatinine, Ser: 0.76 mg/dL (ref 0.50–1.10)
GFR calc Af Amer: >90 mL/min (ref >90)
Glucose, Bld: 90 mg/dL (ref 70–99)
Potassium: 3.5 mmol/L (ref 3.5–5.1)
Sodium: 137 mmol/L (ref 135–145)
Total Bilirubin: 1 mg/dL (ref 0.3–1.2)
Total Protein: 6.5 g/dL (ref 6.0–8.3)

## 2014-08-12 MED ORDER — FERROUS SULFATE 325 (65 FE) MG PO TABS: 325.0000 mg | ORAL_TABLET | Freq: Two times a day (BID) | ORAL | Status: DC

## 2014-08-12 MED ORDER — ACETAMINOPHEN 500 MG PO TABS: 1000.0000 mg | ORAL_TABLET | Freq: Every day | ORAL | Status: DC | PRN

## 2014-08-12 MED ORDER — HYDROCODONE-ACETAMINOPHEN 5-325 MG PO TABS: 1.0000 | ORAL_TABLET | Freq: Four times a day (QID) | ORAL | Status: DC | PRN

## 2014-08-12 MED ORDER — IBUPROFEN 200 MG PO TABS: 400.0000 mg | ORAL_TABLET | Freq: Four times a day (QID) | ORAL | Status: DC | PRN

## 2014-08-12 NOTE — Discharge Summary (Signed)
Physician Discharge Summary  Breanna Miranda WYO:[id number] DOB: 12/28/1985 DOA: 08/10/2014  PCP: No PCP Per Patient  Admit date: 08/10/2014 Discharge date: 08/12/2014  Time spent: Greater than 30 minutes  Recommendations for Outpatient Follow-up:  1. PCP in 1 week with repeat labs (CBC & BMP). 2. [facility] clinic/GYN on 08/18/14 at 1:30 PM. New patient appointment. 3. Dr. Karie Chimera, Neurosurgery in 1 week. 4. [facility] heart care: MDs office will arrange for outpatient heart monitor and follow-up.  Discharge Diagnoses:  Principal Problem:   Anemia Active Problems:   Menorrhagia   Anxiety   Syncope   Discharge Condition: Improved & Stable  Diet recommendation: Regular diet.  Filed Weights   08/12/14 0458  Weight: 84.823 kg (187 lb)    History of present illness:  29 year old female patient who has 4 children, status post tubal ligation, long history of menorrhagia for which she has not sought attention, anemia and recurrent syncope, seen by PCP day prior to admission and advised to come to ED for hemoglobin of 6.4 g per DL. She gives a 4 year history of frequent passing out spells (10 episodes in 2015 and 2 episodes thus far in 2016). Most of these episodes are in upright position. Some of these episodes are associated with involuntary movement-"seizure-like" as per family members/witnesses. Evaluation in the ER included a negative brown stool guaic, Hb of 6.5 g per dL, and BUN of 7.  Hospital Course:   1. Severe iron deficiency anemia secondary to menorrhagia: Status post 2 units of PRBCs and hemoglobin improved from 6.4 > 8.5 >9. Serum iron <10 and ferritin 5. Provided dose of IV iron to replenish stores. We'll start oral iron supplements at discharge. Will need definitive management of menorrhagia. 2. Menorrhagia: Discussed with GYN M.D. on call on 2/18 and patient has follow-up appointment for GYN clinic on 2/25. Patient is advised to keep the  appointment. 3. Recurrent syncope: Most likely related to problem #1. No orthostatic blood pressure changes. Telemetry: Sinus rhythm without abnormalities. 2-D echo: Unremarkable and results as below. Neurology consulted and suggested that this is probably syncope rather than seizures. EEG: No seizure activity. MRI brain results as below (discussed MRI results with neurosurgeon on call-recommends outpatient follow-up with him). TSH normal. Patient has ambulated in the room without dizziness, lightheadedness or feeling like she is going to pass out. Requested cardiology to arrange for Holter monitor outpatient. Patient counseled not to drive until cleared by outpatient M.D. during follow-up. She verbalized understanding. 4. Hypokalemia: Replaced 5. History of anxiety: When necessary Ativan 6. 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 Karie Chimera, neurosurgeon on call who advised outpatient follow-up with him upon discharge and stated that above findings rarely cause syncope.  Consultations:  Neurology  Procedures:  None    Discharge Exam:  Complaints:  Denies complaints. Ambulated in the room without dizziness, lightheadedness, dyspnea, chest pain or sensation of passing out.  Filed Vitals:   08/11/14 0630 08/11/14 2134 08/12/14 0458 08/12/14 0526  BP: 100/72 101/53  107/70  Pulse: 71 81  60  Temp: 97.8 F (36.6 C) 98 F (36.7 C)  98.7 F (37.1 C)  TempSrc: Oral Oral  Oral  Resp: 20 18  20   Height:   5\' 4"  (1.626 m)   Weight:   84.823 kg (187 lb)   SpO2: 100% 100%  100%     General exam: pleasant young female lying comfortably supine in bed.  Respiratory system: Clear. No  increased work of breathing. Cardiovascular system: S1 & S2 heard, RRR. No JVD, murmurs, gallops, clicks or pedal edema. Gastrointestinal system: Abdomen is nondistended, soft and nontender. Normal bowel sounds heard. Central nervous system: Alert and oriented.  No focal neurological deficits. Extremities: Symmetric 5 x 5 power.  Discharge Instructions      Discharge Instructions    Call MD for:  difficulty breathing, headache or visual disturbances    Complete by:  As directed      Call MD for:  extreme fatigue    Complete by:  As directed      Call MD for:  persistant dizziness or light-headedness    Complete by:  As directed      Call MD for:  severe uncontrolled pain    Complete by:  As directed      Call MD for:    Complete by:  As directed   Passing out episodes.     Diet general    Complete by:  As directed      Driving Restrictions    Complete by:  As directed   No driving until cleared by M.D.     Increase activity slowly    Complete by:  As directed             Medication List    STOP taking these medications        clindamycin 150 MG capsule  Commonly known as:  CLEOCIN     fluconazole 150 MG tablet  Commonly known as:  DIFLUCAN     Phenylephrine-Pheniramine-DM 04-13-19 MG Pack     sulfamethoxazole-trimethoprim 800-160 MG per tablet  Commonly known as:  SEPTRA DS      TAKE these medications        acetaminophen 500 MG tablet  Commonly known as:  TYLENOL  Take 2 tablets (1,000 mg total) by mouth daily as needed for mild pain.     ferrous sulfate 325 (65 FE) MG tablet  Take 1 tablet (325 mg total) by mouth 2 (two) times daily with a meal.     HYDROcodone-acetaminophen 5-325 MG per tablet  Commonly known as:  NORCO/VICODIN  Take 1 tablet by mouth every 6 (six) hours as needed for severe pain.     hydrocortisone 2.5 % lotion  Apply topically 2 (two) times daily.     ibuprofen 200 MG tablet  Commonly known as:  ADVIL,MOTRIN  Take 2 tablets (400 mg total) by mouth every 6 (six) hours as needed for moderate pain.     LORazepam 0.5 MG tablet  Commonly known as:  ATIVAN  Take 0.5 mg by mouth at bedtime as needed for sleep.     ranitidine 150 MG tablet  Commonly known as:  ZANTAC  Take 150 mg by mouth  daily.       Follow-up Information    Follow up with [facility] On 08/18/2014.   Why:  At 1:30 PM.   Contact information:   [place] [address] [phone]      Follow up with Primary Medical Doctor. Schedule an appointment as soon as possible for a visit in 1 week.   Why:  To be seen with repeat labs (CBC & BMP).      Follow up with Faythe Ghee, MD. Schedule an appointment as soon as possible for a visit in 1 week.   Specialty:  Neurosurgery   Why:  To be seen regarding evaluation of MRI Brain findings.  Contact information:   [address] [place] [address] [phone]        The results of significant diagnostics from this hospitalization (including imaging, microbiology, ancillary and laboratory) are listed below for reference.    Significant Diagnostic Studies: Mr Kizzie Fantasia Contrast  08-13-14   CLINICAL DATA:  Syncopal episode versus seizure occurring 08/10/2014. History of anemia. Similar Symptoms for 4 years.  EXAM: MRI HEAD WITHOUT AND WITH CONTRAST  TECHNIQUE: Multiplanar, multiecho pulse sequences of the brain and surrounding structures were obtained without and with intravenous contrast.  CONTRAST:  63mL MULTIHANCE GADOBENATE DIMEGLUMINE 529 MG/ML IV SOLN  COMPARISON:  CT head 07/17/2013.  FINDINGS: No evidence for acute infarction, hemorrhage, mass lesion, hydrocephalus, or extra-axial fluid. No postictal sequelae air seen.  No atrophy or white matter disease. No evidence for watershed infarction. Flow voids are maintained. No chronic hemorrhage. Negative orbits.  The cerebellar tonsils are abnormally shaped and impacted in the upper cervical canal. There is 9 mm of tonsillar descent as measured on sagittal T1 image 13 series 2. The low-lying tonsils which are impacted are confirmed on axial image 2 series 6. Findings are consistent with Chiari I malformation. This could contribute to episodes of  syncope. No hydromyelia is present.  The upper cervical vertebrae demonstrate abnormal bone marrow as does the clivus, with conversion to red marrow. This is consistent with the history of severe anemia.  Partial empty sella. The pituitary is mildly flattened, and there is mild expansion of the sella turcica. This could contribute to headache.  Post infusion, no abnormal enhancement of the brain or meninges. Extracranial soft tissues grossly unremarkable. Knob nasopharyngeal adenoidal hypertrophy. No sinus or mastoid fluid. Negative orbits.  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 This could contribute to episodic syncope. Neurosurgical consultation may be warranted.  No acute infarction is evident.  No postictal sequelae are seen.  Abnormal bone marrow signal correlating with patient's severe anemia.   Electronically Signed   By: Rolla Flatten M.D.   On: August 13, 2014 17:07    Microbiology: No results found for this or any previous visit (from the past 240 hour(s)).   Labs: Basic Metabolic Panel:  Recent Labs Lab 08/10/14 1653 08/12/14 0725  NA 134* 137  K 3.1* 3.5  CL 103 106  CO2 25 23  GLUCOSE 100* 90  BUN 7 <5*  CREATININE 0.77 0.76  CALCIUM 8.8 9.3   Liver Function Tests:  Recent Labs Lab 08/12/14 0725  AST 25  ALT 15  ALKPHOS 56  BILITOT 1.0  PROT 6.5  ALBUMIN 3.6   No results for input(s): LIPASE, AMYLASE in the last 168 hours. No results for input(s): AMMONIA in the last 168 hours. CBC:  Recent Labs Lab 08/10/14 1653 08/13/14 0644 08/12/14 0725  WBC 5.9  --  5.4  HGB 6.4* 8.5* 9.0*  HCT 22.2* 28.6* 30.5*  MCV 54.4*  --  60.2*  PLT 293  --  326   Cardiac Enzymes: No results for input(s): CKTOTAL, CKMB, CKMBINDEX, TROPONINI in the last 168 hours. BNP: BNP (last 3 results) No results for input(s): BNP in the last 8760 hours.  ProBNP (last 3 results) No results for input(s): PROBNP in the last 8760 hours.  CBG: No  results for input(s): GLUCAP in the last 168 hours.   Additional labs: 1. Anemia panel: Iron <10, TIBC not calculated, saturation ratios not calculated, ferritin 5, folate 17.4, vitamin B-12: 731 & reticulocyte count  59. 2. TSH: 1.047. 3. Urine pregnancy test: Negative 4. 2 D Echo 08/12/2014: Study Conclusions  - Left ventricle: The cavity size was normal. Wall thickness was normal. The estimated ejection fraction was 60%. Wall motion was normal; there were no regional wall motion abnormalities. - Right ventricle: The cavity size was normal. Systolic function was normal. - Pericardium, extracardiac: A trivial pericardial effusion was identified posterior to the heart. 5. EEG 08/11/14: Impression: this is a normal awake and asleep EEG. Please, be aware that a normal EEG does not exclude the possibility of epilepsy.    Signed:  Vernell Leep, MD, FACP, FHM. [facility] Pager [phone]  If 7PM-7AM, please contact night-coverage [email or web address] Password [email or web address] 08/12/2014, 1:23 PM

## 2014-08-12 NOTE — Progress Notes (Signed)
UR completed 

## 2014-08-12 NOTE — Progress Notes (Signed)
Patient discharge teaching given, including activity, diet, follow-up appoints, and medications. Patient verbalized understanding of all discharge instructions. IV access was d/c'd. Vitals are stable. Skin is intact except as charted in most recent assessments. Pt to be escorted out by NT, to be driven home by family. 

## 2014-08-12 NOTE — Discharge Instructions (Signed)
Iron Deficiency Anemia Anemia is a condition in which there are less red blood cells or hemoglobin in the blood than normal. Hemoglobin is the part of red blood cells that carries oxygen. Iron deficiency anemia is anemia caused by too little iron. It is the most common type of anemia. It may leave you tired and short of breath. CAUSES   Lack of iron in the diet.  Poor absorption of iron, as seen with intestinal disorders.  Intestinal bleeding.  Heavy periods. SIGNS AND SYMPTOMS  Mild anemia may not be noticeable. Symptoms may include:  Fatigue.  Headache.  Pale skin.  Weakness.  Tiredness.  Shortness of breath.  Dizziness.  Cold hands and feet.  Fast or irregular heartbeat. DIAGNOSIS  Diagnosis requires a thorough evaluation and physical exam by your health care provider. Blood tests are generally used to confirm iron deficiency anemia. Additional tests may be done to find the underlying cause of your anemia. These may include:  Testing for blood in the stool (fecal occult blood test).  A procedure to see inside the colon and rectum (colonoscopy).  A procedure to see inside the esophagus and stomach (endoscopy). TREATMENT  Iron deficiency anemia is treated by correcting the cause of the deficiency. Treatment may involve:  Adding iron-rich foods to your diet.  Taking iron supplements. Pregnant or breastfeeding women need to take extra iron because their normal diet usually does not provide the required amount.  Taking vitamins. Vitamin C improves the absorption of iron. Your health care provider may recommend that you take your iron tablets with a glass of orange juice or vitamin C supplement.  Medicines to make heavy menstrual flow lighter.  Surgery. HOME CARE INSTRUCTIONS   Take iron as directed by your health care provider.  If you cannot tolerate taking iron supplements by mouth, talk to your health care provider about taking them through a vein  (intravenously) or an injection into a muscle.  For the best iron absorption, iron supplements should be taken on an empty stomach. If you cannot tolerate them on an empty stomach, you may need to take them with food.  Do not drink milk or take antacids at the same time as your iron supplements. Milk and antacids may interfere with the absorption of iron.  Iron supplements can cause constipation. Make sure to include fiber in your diet to prevent constipation. A stool softener may also be recommended.  Take vitamins as directed by your health care provider.  Eat a diet rich in iron. Foods high in iron include liver, lean beef, whole-grain bread, eggs, dried fruit, and dark green leafy vegetables. SEEK IMMEDIATE MEDICAL CARE IF:   You faint. If this happens, do not drive. Call your local emergency services (911 in [place] if no other help is available.  You have chest pain.  You feel nauseous or vomit.  You have severe or increased shortness of breath with activity.  You feel weak.  You have a rapid heartbeat.  You have unexplained sweating.  You become light-headed when getting up from a chair or bed. MAKE SURE YOU:   Understand these instructions.  Will watch your condition.  Will get help right away if you are not doing well or get worse. Document Released: 06/07/2000 Document Revised: 06/15/2013 Document Reviewed: 02/15/2013 [facility] Patient Information 2015 [place], [place]. This information is not intended to replace advice given to you by your health care provider. Make sure you discuss any questions you have with your health care provider.  Menorrhagia Menorrhagia is the medical term for when your menstrual periods are heavy or last longer than usual. With menorrhagia, every period you have may cause enough blood loss and cramping that you are unable to maintain your usual activities. CAUSES  In some cases, the cause of heavy periods is unknown, but a number of conditions  may cause menorrhagia. Common causes include:  A problem with the hormone-producing thyroid gland (hypothyroid).  Noncancerous growths in the uterus (polyps or fibroids).  An imbalance of the estrogen and progesterone hormones.  One of your ovaries not releasing an egg during one or more months.  Side effects of having an intrauterine device (IUD).  Side effects of some medicines, such as anti-inflammatory medicines or blood thinners.  A bleeding disorder that stops your blood from clotting normally. SIGNS AND SYMPTOMS  During a normal period, bleeding lasts between 4 and 8 days. Signs that your periods are too heavy include:  You routinely have to change your pad or tampon every 1 or 2 hours because it is completely soaked.  You pass blood clots larger than 1 inch (2.5 cm) in size.  You have bleeding for more than 7 days.  You need to use pads and tampons at the same time because of heavy bleeding.  You need to wake up to change your pads or tampons during the night.  You have symptoms of anemia, such as tiredness, fatigue, or shortness of breath. DIAGNOSIS  Your health care provider will perform a physical exam and ask you questions about your symptoms and menstrual history. Other tests may be ordered based on what the health care provider finds during the exam. These tests can include:  Blood tests. Blood tests are used to check if you are pregnant or have hormonal changes, a bleeding or thyroid disorder, low iron levels (anemia), or other problems.  Endometrial biopsy. Your health care provider takes a sample of tissue from the inside of your uterus to be examined under a microscope.  Pelvic ultrasound. This test uses sound waves to make a picture of your uterus, ovaries, and vagina. The pictures can show if you have fibroids or other growths.  Hysteroscopy. For this test, your health care provider will use a small telescope to look inside your uterus. Based on the results  of your initial tests, your health care provider may recommend further testing. TREATMENT  Treatment may not be needed. If it is needed, your health care provider may recommend treatment with one or more medicines first. If these do not reduce bleeding enough, a surgical treatment might be an option. The best treatment for you will depend on:   Whether you need to prevent pregnancy.  Your desire to have children in the future.  The cause and severity of your bleeding.  Your opinion and personal preference.  Medicines for menorrhagia may include:  Birth control methods that use hormones. These include the pill, skin patch, vaginal ring, shots that you get every 3 months, hormonal IUD, and implant. These treatments reduce bleeding during your menstrual period.  Medicines that thicken blood and slow bleeding.  Medicines that reduce swelling, such as ibuprofen.  Medicines that contain a synthetic hormone called progestin.   Medicines that make the ovaries stop working for a short time.  You may need surgical treatment for menorrhagia if the medicines are unsuccessful. Treatment options include:  Dilation and curettage (D&C). In this procedure, your health care provider opens (dilates) your cervix and then scrapes or suctions tissue  from the lining of your uterus to reduce menstrual bleeding.  Operative hysteroscopy. This procedure uses a tiny tube with a light (hysteroscope) to view your uterine cavity and can help in the surgical removal of a polyp that may be causing heavy periods.  Endometrial ablation. Through various techniques, your health care provider permanently destroys the entire lining of your uterus (endometrium). After endometrial ablation, most women have little or no menstrual flow. Endometrial ablation reduces your ability to become pregnant.  Endometrial resection. This surgical procedure uses an electrosurgical wire loop to remove the lining of the uterus. This  procedure also reduces your ability to become pregnant.  Hysterectomy. Surgical removal of the uterus and cervix is a permanent procedure that stops menstrual periods. Pregnancy is not possible after a hysterectomy. This procedure requires anesthesia and hospitalization. HOME CARE INSTRUCTIONS   Only take over-the-counter or prescription medicines as directed by your health care provider. Take prescribed medicines exactly as directed. Do not change or switch medicines without consulting your health care provider.  Take any prescribed iron pills exactly as directed by your health care provider. Long-term heavy bleeding may result in low iron levels. Iron pills help replace the iron your body lost from heavy bleeding. Iron may cause constipation. If this becomes a problem, increase the bran, fruits, and roughage in your diet.  Do not take aspirin or medicines that contain aspirin 1 week before or during your menstrual period. Aspirin may make the bleeding worse.  If you need to change your sanitary pad or tampon more than once every 2 hours, stay in bed and rest as much as possible until the bleeding stops.  Eat well-balanced meals. Eat foods high in iron. Examples are leafy green vegetables, meat, liver, eggs, and whole grain breads and cereals. Do not try to lose weight until the abnormal bleeding has stopped and your blood iron level is back to normal. SEEK MEDICAL CARE IF:   You soak through a pad or tampon every 1 or 2 hours, and this happens every time you have a period.  You need to use pads and tampons at the same time because you are bleeding so much.  You need to change your pad or tampon during the night.  You have a period that lasts for more than 8 days.  You pass clots bigger than 1 inch wide.  You have irregular periods that happen more or less often than once a month.  You feel dizzy or faint.  You feel very weak or tired.  You feel short of breath or feel your heart is  beating too fast when you exercise.  You have nausea and vomiting or diarrhea while you are taking your medicine.  You have any problems that may be related to the medicine you are taking. SEEK IMMEDIATE MEDICAL CARE IF:   You soak through 4 or more pads or tampons in 2 hours.  You have any bleeding while you are pregnant. MAKE SURE YOU:   Understand these instructions.  Will watch your condition.  Will get help right away if you are not doing well or get worse. Document Released: 06/10/2005 Document Revised: 06/15/2013 Document Reviewed: 11/29/2012 [facility] Patient Information 2015 [place], [place]. This information is not intended to replace advice given to you by your health care provider. Make sure you discuss any questions you have with your health care provider.  Syncope Syncope is a medical term for fainting or passing out. This means you lose consciousness and drop to  the ground. People are generally unconscious for less than 5 minutes. You may have some muscle twitches for up to 15 seconds before waking up and returning to normal. Syncope occurs more often in older adults, but it can happen to anyone. While most causes of syncope are not dangerous, syncope can be a sign of a serious medical problem. It is important to seek medical care.  CAUSES  Syncope is caused by a sudden drop in blood flow to the brain. The specific cause is often not determined. Factors that can bring on syncope include:  Taking medicines that lower blood pressure.  Sudden changes in posture, such as standing up quickly.  Taking more medicine than prescribed.  Standing in one place for too long.  Seizure disorders.  Dehydration and excessive exposure to heat.  Low blood sugar (hypoglycemia).  Straining to have a bowel movement.  Heart disease, irregular heartbeat, or other circulatory problems.  Fear, emotional distress, seeing blood, or severe pain. SYMPTOMS  Right before fainting, you  may:  Feel dizzy or light-headed.  Feel nauseous.  See all white or all black in your field of vision.  Have cold, clammy skin. DIAGNOSIS  Your health care provider will ask about your symptoms, perform a physical exam, and perform an electrocardiogram (ECG) to record the electrical activity of your heart. Your health care provider may also perform other heart or blood tests to determine the cause of your syncope which may include:  Transthoracic echocardiogram (TTE). During echocardiography, sound waves are used to evaluate how blood flows through your heart.  Transesophageal echocardiogram (TEE).  Cardiac monitoring. This allows your health care provider to monitor your heart rate and rhythm in real time.  Holter monitor. This is a portable device that records your heartbeat and can help diagnose heart arrhythmias. It allows your health care provider to track your heart activity for several days, if needed.  Stress tests by exercise or by giving medicine that makes the heart beat faster. TREATMENT  In most cases, no treatment is needed. Depending on the cause of your syncope, your health care provider may recommend changing or stopping some of your medicines. HOME CARE INSTRUCTIONS  Have someone stay with you until you feel stable.  Do not drive, use machinery, or play sports until your health care provider says it is okay.  Keep all follow-up appointments as directed by your health care provider.  Lie down right away if you start feeling like you might faint. Breathe deeply and steadily. Wait until all the symptoms have passed.  Drink enough fluids to keep your urine clear or pale yellow.  If you are taking blood pressure or heart medicine, get up slowly and take several minutes to sit and then stand. This can reduce dizziness. SEEK IMMEDIATE MEDICAL CARE IF:   You have a severe headache.  You have unusual pain in the chest, abdomen, or back.  You are bleeding from your  mouth or rectum, or you have black or tarry stool.  You have an irregular or very fast heartbeat.  You have pain with breathing.  You have repeated fainting or seizure-like jerking during an episode.  You faint when sitting or lying down.  You have confusion.  You have trouble walking.  You have severe weakness.  You have vision problems. If you fainted, call your local emergency services (911 in [place] Do not drive yourself to the hospital.  MAKE SURE YOU:  Understand these instructions.  Will watch your condition.  Will get help right away if you are not doing well or get worse. Document Released: 06/10/2005 Document Revised: 06/15/2013 Document Reviewed: 08/09/2011 [facility] Patient Information 2015 [place], [place]. This information is not intended to replace advice given to you by your health care provider. Make sure you discuss any questions you have with your health care provider.

## 2014-08-12 NOTE — Progress Notes (Signed)
Echocardiogram 2D Echocardiogram has been performed.  08/12/2014 11:08 AM Maudry Mayhew, RVT, RDCS, RDMS

## 2014-08-14 LAB — TYPE AND SCREEN
ABO/RH(D): B POS
Antibody Screen: NEGATIVE
UNIT DIVISION: 0
Unit division: 0
Unit division: 0
Unit division: 0

## 2014-08-16 ENCOUNTER — Ambulatory Visit: Payer: 59 | Admitting: *Deleted

## 2014-08-16 DIAGNOSIS — R55 Syncope and collapse: Secondary | ICD-10-CM

## 2014-08-17 ENCOUNTER — Telehealth: Payer: Self-pay | Admitting: *Deleted

## 2014-08-17 NOTE — Telephone Encounter (Signed)
Pt called stating that she is having some technical difficulties with her monitor. Please assist   Thanks

## 2014-08-17 NOTE — Telephone Encounter (Signed)
Spoke with patient. She states the monitor is not staying charged and it keeps saying the sensor is not communicating with the monitor.   Advised patient to contact [facility] and request help troubleshooting the monitor, but it sounds like she may need a new monitor mailed to her.   She thanked Therapist, sports and voiced understanding.

## 2014-08-18 ENCOUNTER — Encounter: Payer: Self-pay | Admitting: Obstetrics and Gynecology

## 2014-08-18 ENCOUNTER — Ambulatory Visit (INDEPENDENT_AMBULATORY_CARE_PROVIDER_SITE_OTHER): Payer: 59 | Admitting: Obstetrics and Gynecology

## 2014-08-18 VITALS — BP 111/61 | HR 69 | Temp 97.6°F | Wt 184.0 lb

## 2014-08-18 DIAGNOSIS — N938 Other specified abnormal uterine and vaginal bleeding: Secondary | ICD-10-CM

## 2014-08-18 MED ORDER — NORGESTIMATE-ETH ESTRADIOL 0.25-35 MG-MCG PO TABS: 1.0000 | ORAL_TABLET | Freq: Every day | ORAL | Status: DC

## 2014-08-18 NOTE — Progress Notes (Signed)
Patient ID: Breanna Miranda, female   DOB: 1986-02-01, 29 y.o.   MRN: [id number] 29 yo here for the evaluation of abnormal uterine bleeding. Patient was recently hospitalized and received a blood transfusion secondary to menorrhagia. Patient describes her cycles as occuring monthly lasting 5 days but heavy in flow requiring to use 3 pads per day. Patient is otherwise doing well and is not certain that her bleeding is abnormal. She was under the impression that as we age, the cycle gets heavier. She was prescribed OCP in the past for better cycle control but never refilled it.  Past Medical History  Diagnosis Date  . Menorrhagia   . Anemia   . Anxiety    Past Surgical History  Procedure Laterality Date  . Cholecystectomy    . Tubal ligation     Family History  Problem Relation Age of Onset  . Diabetes Father    History  Substance Use Topics  . Smoking status: Former Smoker    Quit date: 05/13/2009  . Smokeless tobacco: Never Used  . Alcohol Use: 0.6 oz/week    1 Glasses of wine per week     Comment: occaional    A/P 29 yo with abnormal uterine bleeding - Discussed that it is abnormal to use 3 pads at a time - Rx Sprintec with instructions provided - RTC in 3 months for BP check and follow up on OCP

## 2014-08-25 ENCOUNTER — Encounter (HOSPITAL_COMMUNITY): Payer: Self-pay | Admitting: *Deleted

## 2014-08-25 ENCOUNTER — Inpatient Hospital Stay (HOSPITAL_COMMUNITY)
Admission: AD | Admit: 2014-08-25 | Discharge: 2014-08-25 | Disposition: A | Discharge status: Home / Self Care | Payer: 59 | Source: Ambulatory Visit | Attending: Obstetrics & Gynecology | Admitting: Obstetrics & Gynecology

## 2014-08-25 DIAGNOSIS — Z87891 Personal history of nicotine dependence: Secondary | ICD-10-CM | POA: Insufficient documentation

## 2014-08-25 DIAGNOSIS — B9689 Other specified bacterial agents as the cause of diseases classified elsewhere: Secondary | ICD-10-CM

## 2014-08-25 DIAGNOSIS — B379 Candidiasis, unspecified: Secondary | ICD-10-CM | POA: Insufficient documentation

## 2014-08-25 DIAGNOSIS — N898 Other specified noninflammatory disorders of vagina: Secondary | ICD-10-CM | POA: Diagnosis present

## 2014-08-25 DIAGNOSIS — N760 Acute vaginitis: Secondary | ICD-10-CM | POA: Diagnosis not present

## 2014-08-25 DIAGNOSIS — B3731 Acute candidiasis of vulva and vagina: Secondary | ICD-10-CM

## 2014-08-25 DIAGNOSIS — B373 Candidiasis of vulva and vagina: Secondary | ICD-10-CM

## 2014-08-25 DIAGNOSIS — A499 Bacterial infection, unspecified: Secondary | ICD-10-CM

## 2014-08-25 HISTORY — DX: Unspecified asthma, uncomplicated: J45.909

## 2014-08-25 HISTORY — DX: Encounter for other specified aftercare: Z51.89

## 2014-08-25 HISTORY — DX: Cardiac arrhythmia, unspecified: I49.9

## 2014-08-25 LAB — WET PREP, GENITAL: TRICH WET PREP: NONE SEEN

## 2014-08-25 LAB — URINALYSIS, ROUTINE W REFLEX MICROSCOPIC
BILIRUBIN URINE: NEGATIVE
Glucose, UA: NEGATIVE mg/dL
HGB URINE DIPSTICK: NEGATIVE
Ketones, ur: NEGATIVE mg/dL
Leukocytes, UA: NEGATIVE
Nitrite: NEGATIVE
PROTEIN: NEGATIVE mg/dL
Specific Gravity, Urine: 1.015 (ref 1.005–1.030)
Urobilinogen, UA: 0.2 mg/dL (ref 0.0–1.0)
pH: 8 (ref 5.0–8.0)

## 2014-08-25 LAB — POCT PREGNANCY, URINE: Preg Test, Ur: NEGATIVE

## 2014-08-25 MED ORDER — METRONIDAZOLE 500 MG PO TABS
500.0000 mg | ORAL_TABLET | Freq: Two times a day (BID) | ORAL | Status: DC
Start: 2014-08-25 — End: 2015-03-13

## 2014-08-25 MED ORDER — FLUCONAZOLE 150 MG PO TABS: 150.0000 mg | ORAL_TABLET | Freq: Every day | ORAL | Status: DC

## 2014-08-25 NOTE — MAU Note (Signed)
Pt reports she has had a  [employer] and odor x 3 days. Denies pain or discomfort.

## 2014-08-25 NOTE — MAU Note (Signed)
D/c for a few days, smells awful, no pain.

## 2014-08-25 NOTE — MAU Provider Note (Signed)
History     CSN: [id number]  Arrival date and time: 08/25/14 1419   First Provider Initiated Contact with Patient 08/25/14 1605      Chief Complaint  Patient presents with  . Vaginal Discharge   HPI Comments: Breanna Miranda 29 y.o. [id number] presents to MAU with vaginal discharge that started 4 days ago. She had intercourse 5 days ago with her usual partner. The discharge is described as copious, odorous and itchy. She is on birth control pills and that is controlling her menses.   Vaginal Discharge The patient's primary symptoms include vaginal discharge.      Past Medical History  Diagnosis Date  . Menorrhagia   . Anemia   . Anxiety   . Irregular heart rhythm   . Asthma   . Blood transfusion without reported diagnosis 2016    Past Surgical History  Procedure Laterality Date  . Cholecystectomy    . Tubal ligation      Family History  Problem Relation Age of Onset  . Diabetes Father   . Kidney disease Father     failure due to diabetes  . Hypertension Mother     History  Substance Use Topics  . Smoking status: Former Smoker    Quit date: 05/13/2009  . Smokeless tobacco: Never Used  . Alcohol Use: 0.6 oz/week    1 Glasses of wine per week     Comment: occaional    Allergies:  Allergies  Allergen Reactions  . Penicillins Anaphylaxis, Itching and Swelling    Swelling -tongue  . Shrimp [Shellfish Allergy] Anaphylaxis, Hives and Swelling    Prescriptions prior to admission  Medication Sig Dispense Refill Last Dose  . acetaminophen (TYLENOL) 500 MG tablet Take 2 tablets (1,000 mg total) by mouth daily as needed for mild pain.     . ferrous sulfate 325 (65 FE) MG tablet Take 1 tablet (325 mg total) by mouth 2 (two) times daily with a meal. 60 tablet 0   . HYDROcodone-acetaminophen (NORCO/VICODIN) 5-325 MG per tablet Take 1 tablet by mouth every 6 (six) hours as needed for severe pain.     . hydrocortisone 2.5 % lotion Apply topically 2 (two) times  daily. 59 mL 0   . ibuprofen (ADVIL,MOTRIN) 200 MG tablet Take 2 tablets (400 mg total) by mouth every 6 (six) hours as needed for moderate pain.     Marland Kitchen LORazepam (ATIVAN) 0.5 MG tablet Take 0.5 mg by mouth at bedtime as needed for sleep.   Past Week at Unknown time  . norgestimate-ethinyl estradiol (ORTHO-CYCLEN,SPRINTEC,PREVIFEM) 0.25-35 MG-MCG tablet Take 1 tablet by mouth daily. 1 Package 4   . ranitidine (ZANTAC) 150 MG tablet Take 150 mg by mouth daily.   08/10/2014 at Unknown time    Review of Systems  Constitutional: Negative.   HENT: Negative.   Eyes: Negative.   Respiratory: Negative.   Cardiovascular: Negative.   Genitourinary: Positive for vaginal discharge.       See HPI  Musculoskeletal: Negative.   Skin: Negative.   Neurological: Negative.   Psychiatric/Behavioral: Negative.    Physical Exam   Blood pressure 125/67, pulse 79, temperature 99 F (37.2 C), resp. rate 18, height 5\' 4"  (1.626 m), weight 85.095 kg (187 lb 9.6 oz), last menstrual period 07/29/2014.  Physical Exam  Constitutional: She appears well-developed and well-nourished. No distress.  HENT:  Head: Normocephalic and atraumatic.  Eyes: Pupils are equal, round, and reactive to light.  GI: Soft. She exhibits no distension. There  is no tenderness. There is no rebound.  Genitourinary:  Genital:external copious amount discharge Vaginal: thin watery odorous discharge Cervix:closed/ thick Bimanual: nontender    Results for orders placed or performed during the hospital encounter of 08/25/14 (from the past 24 hour(s))  Urinalysis, Routine w reflex microscopic     Status: None   Collection Time: 08/25/14  3:30 PM  Result Value Ref Range   Color, Urine YELLOW YELLOW   APPearance CLEAR CLEAR   Specific Gravity, Urine 1.015 1.005 - 1.030   pH 8.0 5.0 - 8.0   Glucose, UA NEGATIVE NEGATIVE mg/dL   Hgb urine dipstick NEGATIVE NEGATIVE   Bilirubin Urine NEGATIVE NEGATIVE   Ketones, ur NEGATIVE NEGATIVE mg/dL    Protein, ur NEGATIVE NEGATIVE mg/dL   Urobilinogen, UA 0.2 0.0 - 1.0 mg/dL   Nitrite NEGATIVE NEGATIVE   Leukocytes, UA NEGATIVE NEGATIVE  Pregnancy, urine POC     Status: None   Collection Time: 08/25/14  3:42 PM  Result Value Ref Range   Preg Test, Ur NEGATIVE NEGATIVE  Wet prep, genital     Status: Abnormal   Collection Time: 08/25/14  4:28 PM  Result Value Ref Range   Yeast Wet Prep HPF POC FEW (A) NONE SEEN   Trich, Wet Prep NONE SEEN NONE SEEN   Clue Cells Wet Prep HPF POC FEW (A) NONE SEEN   WBC, Wet Prep HPF POC FEW (A) NONE SEEN    MAU Course  Procedures  MDM Wet prep, gc,chlamydia  Assessment and Plan  A: Bacterial Vaginosis Vaginal Yeast  P: Flagyl 500 mg PO BID x 7 days No alcohol/ intercourse for one week Diflucan 150 mg # 2 tabs to take now and after Flagyl Will be called with positive results only Return to MAU as needed  Georgia Duff 08/25/2014, 4:18 PM

## 2014-08-25 NOTE — MAU Note (Signed)
Talking about last wks adm, no longer feeling dizzy,has energy, no longer craving ice or corn starch.

## 2014-08-25 NOTE — Discharge Instructions (Signed)
Monilial Vaginitis Vaginitis in a soreness, swelling and redness (inflammation) of the vagina and vulva. Monilial vaginitis is not a sexually transmitted infection. CAUSES  Yeast vaginitis is caused by yeast (candida) that is normally found in your vagina. With a yeast infection, the candida has overgrown in number to a point that upsets the chemical balance. SYMPTOMS   White, thick vaginal discharge.  Swelling, itching, redness and irritation of the vagina and possibly the lips of the vagina (vulva).  Burning or painful urination.  Painful intercourse. DIAGNOSIS  Things that may contribute to monilial vaginitis are:  Postmenopausal and virginal states.  Pregnancy.  Infections.  Being tired, sick or stressed, especially if you had monilial vaginitis in the past.  Diabetes. Good control will help lower the chance.  Birth control pills.  Tight fitting garments.  Using bubble bath, feminine sprays, douches or deodorant tampons.  Taking certain medications that kill germs (antibiotics).  Sporadic recurrence can occur if you become ill. TREATMENT  Your caregiver will give you medication.  There are several kinds of anti monilial vaginal creams and suppositories specific for monilial vaginitis. For recurrent yeast infections, use a suppository or cream in the vagina 2 times a week, or as directed.  Anti-monilial or steroid cream for the itching or irritation of the vulva may also be used. Get your caregiver's permission.  Painting the vagina with methylene blue solution may help if the monilial cream does not work.  Eating yogurt may help prevent monilial vaginitis. HOME CARE INSTRUCTIONS   Finish all medication as prescribed.  Do not have sex until treatment is completed or after your caregiver tells you it is okay.  Take warm sitz baths.  Do not douche.  Do not use tampons, especially scented ones.  Wear cotton underwear.  Avoid tight pants and panty  hose.  Tell your sexual partner that you have a yeast infection. They should go to their caregiver if they have symptoms such as mild rash or itching.  Your sexual partner should be treated as well if your infection is difficult to eliminate.  Practice safer sex. Use condoms.  Some vaginal medications cause latex condoms to fail. Vaginal medications that harm condoms are:  Cleocin cream.  Butoconazole (Femstat).  Terconazole (Terazol) vaginal suppository.  Miconazole (Monistat) (may be purchased over the counter). SEEK MEDICAL CARE IF:   You have a temperature by mouth above 102 F (38.9 C).  The infection is getting worse after 2 days of treatment.  The infection is not getting better after 3 days of treatment.  You develop blisters in or around your vagina.  You develop vaginal bleeding, and it is not your menstrual period.  You have pain when you urinate.  You develop intestinal problems.  You have pain with sexual intercourse. Document Released: 03/20/2005 Document Revised: 09/02/2011 Document Reviewed: 12/02/2008 [facility] Patient Information 2015 [place], [place]. This information is not intended to replace advice given to you by your health care provider. Make sure you discuss any questions you have with your health care provider. Bacterial Vaginosis Bacterial vaginosis is a vaginal infection that occurs when the normal balance of bacteria in the vagina is disrupted. It results from an overgrowth of certain bacteria. This is the most common vaginal infection in women of childbearing age. Treatment is important to prevent complications, especially in pregnant women, as it can cause a premature delivery. CAUSES  Bacterial vaginosis is caused by an increase in harmful bacteria that are normally present in smaller amounts in the  vagina. Several different kinds of bacteria can cause bacterial vaginosis. However, the reason that the condition develops is not fully  understood. RISK FACTORS Certain activities or behaviors can put you at an increased risk of developing bacterial vaginosis, including:  Having a new sex partner or multiple sex partners.  Douching.  Using an intrauterine device (IUD) for contraception. Women do not get bacterial vaginosis from toilet seats, bedding, swimming pools, or contact with objects around them. SIGNS AND SYMPTOMS  Some women with bacterial vaginosis have no signs or symptoms. Common symptoms include:  Grey vaginal discharge.  A fishlike odor with discharge, especially after sexual intercourse.  Itching or burning of the vagina and vulva.  Burning or pain with urination. DIAGNOSIS  Your health care provider will take a medical history and examine the vagina for signs of bacterial vaginosis. A sample of vaginal fluid may be taken. Your health care provider will look at this sample under a microscope to check for bacteria and abnormal cells. A vaginal pH test may also be done.  TREATMENT  Bacterial vaginosis may be treated with antibiotic medicines. These may be given in the form of a pill or a vaginal cream. A second round of antibiotics may be prescribed if the condition comes back after treatment.  HOME CARE INSTRUCTIONS   Only take over-the-counter or prescription medicines as directed by your health care provider.  If antibiotic medicine was prescribed, take it as directed. Make sure you finish it even if you start to feel better.  Do not have sex until treatment is completed.  Tell all sexual partners that you have a vaginal infection. They should see their health care provider and be treated if they have problems, such as a mild rash or itching.  Practice safe sex by using condoms and only having one sex partner. SEEK MEDICAL CARE IF:   Your symptoms are not improving after 3 days of treatment.  You have increased discharge or pain.  You have a fever. MAKE SURE YOU:   Understand these  instructions.  Will watch your condition.  Will get help right away if you are not doing well or get worse. FOR MORE INFORMATION  Centers for Disease Control and Prevention, Division of STD Prevention: AppraiserFraud.fi American Sexual Health Association (ASHA): www.ashastd.org  Document Released: 06/10/2005 Document Revised: 03/31/2013 Document Reviewed: 01/20/2013 [facility] Patient Information 2015 [place], [place]. This information is not intended to replace advice given to you by your health care provider. Make sure you discuss any questions you have with your health care provider.

## 2014-08-26 LAB — GC/CHLAMYDIA PROBE AMP (~~LOC~~) NOT AT ARMC
Chlamydia: NEGATIVE
Neisseria Gonorrhea: NEGATIVE

## 2014-09-15 ENCOUNTER — Encounter (HOSPITAL_COMMUNITY): Payer: Self-pay | Admitting: Neurology

## 2014-09-15 ENCOUNTER — Emergency Department (HOSPITAL_COMMUNITY)
Admission: EM | Admit: 2014-09-15 | Discharge: 2014-09-16 | Disposition: A | Discharge status: Home / Self Care | Payer: 59 | Attending: Emergency Medicine | Admitting: Emergency Medicine

## 2014-09-15 ENCOUNTER — Emergency Department (HOSPITAL_COMMUNITY): Payer: 59

## 2014-09-15 DIAGNOSIS — J45909 Unspecified asthma, uncomplicated: Secondary | ICD-10-CM | POA: Insufficient documentation

## 2014-09-15 DIAGNOSIS — Z79899 Other long term (current) drug therapy: Secondary | ICD-10-CM | POA: Insufficient documentation

## 2014-09-15 DIAGNOSIS — Z8679 Personal history of other diseases of the circulatory system: Secondary | ICD-10-CM | POA: Diagnosis not present

## 2014-09-15 DIAGNOSIS — Z793 Long term (current) use of hormonal contraceptives: Secondary | ICD-10-CM | POA: Insufficient documentation

## 2014-09-15 DIAGNOSIS — N939 Abnormal uterine and vaginal bleeding, unspecified: Secondary | ICD-10-CM

## 2014-09-15 DIAGNOSIS — Z880 Allergy status to penicillin: Secondary | ICD-10-CM | POA: Diagnosis not present

## 2014-09-15 DIAGNOSIS — Z3202 Encounter for pregnancy test, result negative: Secondary | ICD-10-CM | POA: Insufficient documentation

## 2014-09-15 DIAGNOSIS — D649 Anemia, unspecified: Secondary | ICD-10-CM | POA: Insufficient documentation

## 2014-09-15 DIAGNOSIS — Z792 Long term (current) use of antibiotics: Secondary | ICD-10-CM | POA: Diagnosis not present

## 2014-09-15 DIAGNOSIS — Z87891 Personal history of nicotine dependence: Secondary | ICD-10-CM | POA: Diagnosis not present

## 2014-09-15 DIAGNOSIS — Z7952 Long term (current) use of systemic steroids: Secondary | ICD-10-CM | POA: Diagnosis not present

## 2014-09-15 DIAGNOSIS — F419 Anxiety disorder, unspecified: Secondary | ICD-10-CM | POA: Diagnosis not present

## 2014-09-15 LAB — URINALYSIS, ROUTINE W REFLEX MICROSCOPIC
GLUCOSE, UA: NEGATIVE mg/dL
Ketones, ur: 15 mg/dL — AB
Leukocytes, UA: NEGATIVE
Nitrite: NEGATIVE
PH: 6.5 (ref 5.0–8.0)
Protein, ur: 30 mg/dL — AB
SPECIFIC GRAVITY, URINE: 1.043 — AB (ref 1.005–1.030)
UROBILINOGEN UA: 1 mg/dL (ref 0.0–1.0)

## 2014-09-15 LAB — CBC
HEMATOCRIT: 35.4 % — AB (ref 36.0–46.0)
Hemoglobin: 10.7 g/dL — ABNORMAL LOW (ref 12.0–15.0)
MCH: 21.7 pg — ABNORMAL LOW (ref 26.0–34.0)
MCHC: 30.2 g/dL (ref 30.0–36.0)
MCV: 72 fL — ABNORMAL LOW (ref 78.0–100.0)
PLATELETS: 268 10*3/uL (ref 150–400)
RBC: 4.92 MIL/uL (ref 3.87–5.11)
WBC: 7.7 10*3/uL (ref 4.0–10.5)

## 2014-09-15 LAB — WET PREP, GENITAL
TRICH WET PREP: NONE SEEN
YEAST WET PREP: NONE SEEN

## 2014-09-15 LAB — TYPE AND SCREEN
ABO/RH(D): B POS
Antibody Screen: NEGATIVE

## 2014-09-15 LAB — URINE MICROSCOPIC-ADD ON

## 2014-09-15 LAB — POC URINE PREG, ED: PREG TEST UR: NEGATIVE

## 2014-09-15 MED ORDER — SODIUM CHLORIDE 0.9 % IV BOLUS (SEPSIS)
1000.0000 mL | INTRAVENOUS | Status: AC
  Administered 2014-09-15: 1000 mL via INTRAVENOUS

## 2014-09-15 MED ORDER — NAPROXEN 500 MG PO TABS: 500.0000 mg | ORAL_TABLET | Freq: Two times a day (BID) | ORAL | Status: DC

## 2014-09-15 MED ORDER — KETOROLAC TROMETHAMINE 30 MG/ML IJ SOLN
30.0000 mg | Freq: Once | INTRAMUSCULAR | Status: AC
  Administered 2014-09-15: 30 mg via INTRAVENOUS
  Filled 2014-09-15: qty 1

## 2014-09-15 NOTE — ED Notes (Signed)
Pt reports vaginal bleeding since last night; is very heavy now. Has had to change her clothes 3 times, is wearing 4 pads and a washcloth and still feels the blood coming out. Dark red blood. 3 weeks ago had blood transfusion [facility] 4. Was started on birth control 3 weeks ago to prevent bleeding. Has lower abd cramping.

## 2014-09-15 NOTE — Discharge Instructions (Signed)
Please follow the directions provided.  Be sure to follow-up with your OB/GYN later this week for further management of this vaginal bleeding.  Take the naproxen twice a day to help with cramping. Take all other meds as directed.  Don't hesitate to return for any new, worsening or concerning symptoms.     SEEK IMMEDIATE MEDICAL CARE IF:  An oral temperature above 102 F (38.9 C) develops.  You develop chills.  You are changing your sanitary pad or tampon more than once an hour.  You develop abdominal pain.  You pass out or faint.

## 2014-09-15 NOTE — ED Provider Notes (Signed)
CSN: [id number]     Arrival date & time 09/15/14  1742 History   First MD Initiated Contact with Patient 09/15/14 2022     Chief Complaint  Patient presents with  . Vaginal Bleeding   (Consider location/radiation/quality/duration/timing/severity/associated sxs/prior Treatment) HPI Breanna Miranda is a 29 yo female presenting with vaginal bleeding. She has a history of heavy bleeding with her menstrual periods and was recently started on OCP to help with bleeding.  She started her week of placebo pills 5 days ago.  The bleeding began last night and was heavy but close to her normal.  This am while at work she began to have significant abdominal cramping followed by passing of large clots and other components that she did not recognize.  She had to wear 4 pads at one time and a washcloth and was still saturating her clothes.  She denies any fevers, chills, vaginal pain or discharge.   Past Medical History  Diagnosis Date  . Menorrhagia   . Anemia   . Anxiety   . Irregular heart rhythm   . Asthma   . Blood transfusion without reported diagnosis 2016   Past Surgical History  Procedure Laterality Date  . Cholecystectomy    . Tubal ligation     Family History  Problem Relation Age of Onset  . Diabetes Father   . Kidney disease Father     failure due to diabetes  . Hypertension Mother    History  Substance Use Topics  . Smoking status: Former Smoker    Quit date: 05/13/2009  . Smokeless tobacco: Never Used  . Alcohol Use: 0.6 oz/week    1 Glasses of wine per week     Comment: occaional   OB History    Gravida Para Term Preterm AB TAB SAB Ectopic Multiple Living   5 4 2 2 1  1   4      Review of Systems  Constitutional: Negative for fever and chills.  HENT: Negative for sore throat.   Eyes: Negative for visual disturbance.  Respiratory: Negative for cough and shortness of breath.   Cardiovascular: Negative for chest pain and leg swelling.  Gastrointestinal: Negative  for nausea, vomiting, abdominal pain and diarrhea.  Genitourinary: Positive for vaginal discharge, menstrual problem and pelvic pain. Negative for dysuria.  Musculoskeletal: Negative for myalgias.  Skin: Negative for rash.  Neurological: Negative for weakness, numbness and headaches.    Allergies  Penicillins and Shrimp  Home Medications   Prior to Admission medications   Medication Sig Start Date End Date Taking? Authorizing Provider  acetaminophen (TYLENOL) 500 MG tablet Take 2 tablets (1,000 mg total) by mouth daily as needed for mild pain. 08/12/14  Yes Modena Jansky, MD  Ascorbic Acid (VITAMIN C PO) Take 60 mg by mouth 3 (three) times daily.   Yes Historical Provider, MD  ferrous sulfate 325 (65 FE) MG tablet Take 1 tablet (325 mg total) by mouth 2 (two) times daily with a meal. Patient taking differently: Take 325 mg by mouth daily with breakfast.  08/12/14  Yes Modena Jansky, MD  hydrocortisone 2.5 % lotion Apply topically 2 (two) times daily. 06/22/14  Yes Hannah Muthersbaugh, PA-C  LORazepam (ATIVAN) 0.5 MG tablet Take 0.5 mg by mouth at bedtime as needed for sleep.   Yes Historical Provider, MD  norgestimate-ethinyl estradiol (ORTHO-CYCLEN,SPRINTEC,PREVIFEM) 0.25-35 MG-MCG tablet Take 1 tablet by mouth daily. 08/18/14  Yes Peggy Constant, MD  ranitidine (ZANTAC) 150 MG tablet Take 150 mg  by mouth daily.   Yes Historical Provider, MD  fluconazole (DIFLUCAN) 150 MG tablet Take 1 tablet (150 mg total) by mouth daily. Take one tab now and one when antibiotics are finished Patient not taking: Reported on 09/15/2014 08/25/14   Olegario Messier, NP  metroNIDAZOLE (FLAGYL) 500 MG tablet Take 1 tablet (500 mg total) by mouth 2 (two) times daily. Patient not taking: Reported on 09/15/2014 08/25/14   Connye Burkitt Barefoot, NP   BP 116/102 mmHg  Pulse 73  Temp(Src) 98.4 F (36.9 C) (Oral)  Resp 16  Ht 5\' 4"  (1.626 m)  SpO2 100%  LMP 07/29/2014 Physical Exam  Constitutional: She is oriented  to person, place, and time. She appears well-developed and well-nourished. No distress.  HENT:  Head: Normocephalic and atraumatic.  Eyes: Conjunctivae are normal.  Neck: Neck supple.  Cardiovascular: Normal rate, regular rhythm and intact distal pulses.   Pulmonary/Chest: Effort normal and breath sounds normal. No respiratory distress. She has no wheezes. She has no rales. She exhibits no tenderness.  Abdominal: Soft. She exhibits no distension and no mass. There is no hepatosplenomegaly. There is tenderness in the suprapubic area. There is no rigidity, no rebound, no guarding, no CVA tenderness, no tenderness at McBurney's point and negative Murphy's sign.    Genitourinary: There is no tenderness on the right labia. There is no tenderness on the left labia. Uterus is not enlarged and not tender. Cervix exhibits no motion tenderness, no discharge and no friability. Right adnexum displays no tenderness. Left adnexum displays no tenderness. There is bleeding in the vagina.  Musculoskeletal: She exhibits no tenderness.  Lymphadenopathy:    She has no cervical adenopathy.  Neurological: She is alert and oriented to person, place, and time.  Skin: Skin is warm and dry. No rash noted. She is not diaphoretic.  Psychiatric: She has a normal mood and affect.  Nursing note and vitals reviewed.   ED Course  Procedures (including critical care time) Labs Review Labs Reviewed  WET PREP, GENITAL - Abnormal; Notable for the following:    Clue Cells Wet Prep HPF POC FEW (*)    WBC, Wet Prep HPF POC FEW (*)    All other components within normal limits  CBC - Abnormal; Notable for the following:    Hemoglobin 10.7 (*)    HCT 35.4 (*)    MCV 72.0 (*)    MCH 21.7 (*)    All other components within normal limits  URINALYSIS, ROUTINE W REFLEX MICROSCOPIC - Abnormal; Notable for the following:    Color, Urine AMBER (*)    APPearance CLOUDY (*)    Specific Gravity, Urine 1.043 (*)    Hgb urine  dipstick LARGE (*)    Bilirubin Urine SMALL (*)    Ketones, ur 15 (*)    Protein, ur 30 (*)    All other components within normal limits  URINE MICROSCOPIC-ADD ON - Abnormal; Notable for the following:    Squamous Epithelial / LPF FEW (*)    All other components within normal limits  POC URINE PREG, ED  TYPE AND SCREEN  GC/CHLAMYDIA PROBE AMP ()    Imaging Review US Transvaginal Non-ob  09/15/2014   CLINICAL DATA:  Vaginal bleeding.  EXAM: TRANSABDOMINAL AND TRANSVAGINAL ULTRASOUND OF PELVIS  TECHNIQUE: Both transabdominal and transvaginal ultrasound examinations of the pelvis were performed. Transabdominal technique was performed for global imaging of the pelvis including uterus, ovaries, adnexal regions, and pelvic cul-de-sac. It was necessary to proceed  with endovaginal exam following the transabdominal exam to visualize the ovaries and adnexa.  COMPARISON:  None  FINDINGS: Uterus  Measurements: 11.4 x 6.2 x 7.2 cm. No fibroids or other mass visualized.  Endometrium  Thickness: 8 mm.  No focal abnormality visualized.  Right ovary  Measurements: 3.1 x 1.9 x 2.4 cm. Normal appearance/no adnexal mass. There is blood flow noted.  Left ovary  Measurements: 2.5 x 1.7 x 1.7 cm. Normal appearance/no adnexal mass. There is blood flow noted.  Other findings  Physiologic free fluid in the cul-de-sac.  IMPRESSION: Normal pelvic ultrasound.   Electronically Signed   By: Jeb Levering M.D.   On: 09/15/2014 23:01   US Pelvis Complete  09/15/2014   CLINICAL DATA:  Vaginal bleeding.  EXAM: TRANSABDOMINAL AND TRANSVAGINAL ULTRASOUND OF PELVIS  TECHNIQUE: Both transabdominal and transvaginal ultrasound examinations of the pelvis were performed. Transabdominal technique was performed for global imaging of the pelvis including uterus, ovaries, adnexal regions, and pelvic cul-de-sac. It was necessary to proceed with endovaginal exam following the transabdominal exam to visualize the ovaries and adnexa.   COMPARISON:  None  FINDINGS: Uterus  Measurements: 11.4 x 6.2 x 7.2 cm. No fibroids or other mass visualized.  Endometrium  Thickness: 8 mm.  No focal abnormality visualized.  Right ovary  Measurements: 3.1 x 1.9 x 2.4 cm. Normal appearance/no adnexal mass. There is blood flow noted.  Left ovary  Measurements: 2.5 x 1.7 x 1.7 cm. Normal appearance/no adnexal mass. There is blood flow noted.  Other findings  Physiologic free fluid in the cul-de-sac.  IMPRESSION: Normal pelvic ultrasound.   Electronically Signed   By: Jeb Levering M.D.   On: 09/15/2014 23:01     EKG Interpretation None      MDM   Final diagnoses:  Vaginal bleeding   30 yo with vaginal bleeding after adjustments to her OCP. She has a history of significant anemia after previous vaginal bleeding. Labs done from triage show hgb 10.7 and negative pregnancy test. Pelvic exam is benign and Vaginal [place] negative for significant abnormalities. Pt reports feeling better after   NS bolus, and IV toradol. Case discussed with Dr. Zenia Resides. Discussed findings with pt and advised to follow-up with her Ob/GYN for further mgmt of OCP.  Pt is well-appearing, in no acute distress and vital signs reviewed and not concerning. She appears safe to be discharged. Return precautions provided. Pt aware of plan and in agreement.    Filed Vitals:   09/15/14 1801 09/15/14 2030 09/15/14 2100  BP: 116/102 111/68 116/74  Pulse: 73 72 69  Temp: 98.4 F (36.9 C)    TempSrc: Oral    Resp: 16 16   Height: 5\' 4"  (1.626 m)    SpO2: 100% 100% 100%   Meds given in ED:  Medications  sodium chloride 0.9 % bolus 1,000 mL (0 mLs Intravenous Stopped 09/15/14 2247)  ketorolac (TORADOL) 30 MG/ML injection 30 mg (30 mg Intravenous Given 09/15/14 2113)    Discharge Medication List as of 09/15/2014 11:33 PM    START taking these medications   Details  naproxen (NAPROSYN) 500 MG tablet Take 1 tablet (500 mg total) by mouth 2 (two) times daily., Starting 09/15/2014,  Until Discontinued, Print          Britt Bottom, NP 09/17/14 9629  Lacretia Leigh, MD 09/18/14 [id number]

## 2014-09-16 LAB — GC/CHLAMYDIA PROBE AMP (~~LOC~~) NOT AT ARMC
CHLAMYDIA, DNA PROBE: NEGATIVE
NEISSERIA GONORRHEA: NEGATIVE

## 2014-09-20 ENCOUNTER — Encounter: Payer: Self-pay | Admitting: *Deleted

## 2015-03-13 ENCOUNTER — Encounter (HOSPITAL_COMMUNITY): Payer: Self-pay

## 2015-03-13 ENCOUNTER — Emergency Department (HOSPITAL_COMMUNITY)
Admission: EM | Admit: 2015-03-13 | Discharge: 2015-03-13 | Disposition: A | Discharge status: Home / Self Care | Payer: 59 | Attending: Emergency Medicine | Admitting: Emergency Medicine

## 2015-03-13 DIAGNOSIS — D649 Anemia, unspecified: Secondary | ICD-10-CM | POA: Insufficient documentation

## 2015-03-13 DIAGNOSIS — F419 Anxiety disorder, unspecified: Secondary | ICD-10-CM | POA: Insufficient documentation

## 2015-03-13 DIAGNOSIS — Z79899 Other long term (current) drug therapy: Secondary | ICD-10-CM | POA: Insufficient documentation

## 2015-03-13 DIAGNOSIS — J45909 Unspecified asthma, uncomplicated: Secondary | ICD-10-CM | POA: Insufficient documentation

## 2015-03-13 DIAGNOSIS — Z3202 Encounter for pregnancy test, result negative: Secondary | ICD-10-CM | POA: Insufficient documentation

## 2015-03-13 DIAGNOSIS — Z880 Allergy status to penicillin: Secondary | ICD-10-CM | POA: Insufficient documentation

## 2015-03-13 DIAGNOSIS — Z87891 Personal history of nicotine dependence: Secondary | ICD-10-CM | POA: Insufficient documentation

## 2015-03-13 LAB — I-STAT BETA HCG BLOOD, ED (MC, WL, AP ONLY): I-stat hCG, quantitative: <5 m[IU]/mL (ref <5)

## 2015-03-13 LAB — CBC WITH DIFFERENTIAL/PLATELET
BASOS ABS: 0 10*3/uL (ref 0.0–0.1)
Basophils Relative: 0 %
EOS PCT: 4 %
Eosinophils Absolute: 0.1 10*3/uL (ref 0.0–0.7)
HEMATOCRIT: 32.8 % — AB (ref 36.0–46.0)
Hemoglobin: 10.3 g/dL — ABNORMAL LOW (ref 12.0–15.0)
Lymphocytes Relative: 43 %
Lymphs Abs: 1.4 10*3/uL (ref 0.7–4.0)
MCH: 22.2 pg — ABNORMAL LOW (ref 26.0–34.0)
MCHC: 31.4 g/dL (ref 30.0–36.0)
MCV: 70.7 fL — AB (ref 78.0–100.0)
MONO ABS: 0.3 10*3/uL (ref 0.1–1.0)
MONOS PCT: 10 %
NEUTROS ABS: 1.4 10*3/uL — AB (ref 1.7–7.7)
Neutrophils Relative %: 43 %
PLATELETS: 232 10*3/uL (ref 150–400)
RBC: 4.64 MIL/uL (ref 3.87–5.11)
RDW: 16.1 % — AB (ref 11.5–15.5)
WBC: 3.3 10*3/uL — ABNORMAL LOW (ref 4.0–10.5)

## 2015-03-13 LAB — BASIC METABOLIC PANEL
Anion gap: 6 (ref 5–15)
BUN: 10 mg/dL (ref 6–20)
CALCIUM: 9 mg/dL (ref 8.9–10.3)
CO2: 27 mmol/L (ref 22–32)
CREATININE: 0.8 mg/dL (ref 0.44–1.00)
Chloride: 106 mmol/L (ref 101–111)
GFR calc Af Amer: >60 mL/min (ref >60)
GLUCOSE: 95 mg/dL (ref 65–99)
Potassium: 3.8 mmol/L (ref 3.5–5.1)
Sodium: 139 mmol/L (ref 135–145)

## 2015-03-13 NOTE — ED Provider Notes (Signed)
  Face-to-face evaluation   History: Syncope preceded by dizziness yesterday. No injuries, and fall. Ongoing metromenorrhagia. No painful menses.  Physical exam: Alert, calm, cooperative. Nontoxic appearance.  Medical screening examination/treatment/procedure(s) were conducted as a shared visit with non-physician practitioner(s) and myself.  I personally evaluated the patient during the encounter  Daleen Bo, MD 03/13/15 2101

## 2015-03-13 NOTE — ED Provider Notes (Signed)
CSN: [id number]     Arrival date & time 03/13/15  0840 History   First MD Initiated Contact with Patient 03/13/15 1008     Chief Complaint  Patient presents with  . Vaginal Bleeding     (Consider location/radiation/quality/duration/timing/severity/associated sxs/prior Treatment) HPI   Breanna Miranda is a 29 y.o. female with PMH significant for menorrhagia, anemia (last blood transfusion in April), and anxiety who presents with a frank syncopal episode yesterday. She states she was washing dishes yesterday and then woke up on the floor and her children. She states she was driving her kids to school this morning and felt dizzy, so she came to the ED. Her dizziness lasts about 2 minutes, is sporadic, and independent of position. She states that she sees specks, and can hear herself breathing during these episodes. Associated symptoms include weakness, dizziness, shortness of breath, heavy menstrual cramps, and headache. Denies chest pain, abdominal pain, nausea, vomiting fevers, neck pain. She is currently on the second day of her period. She states her periods are approximately 7 days and heavy. She states that she has been swelling her closed within one hour of changing her pads. She uses 4 pads at a time along with tissue. She states her periods are every 28 days. She has seen a gynecologist in the past for these heavy menstrual cycles and they started her on birth control; however, she states that the birth control made her periods worse so she has stopped that.  she states that she feels similar to the past when her hemoglobin was low.  Past Medical History  Diagnosis Date  . Menorrhagia   . Anemia   . Anxiety   . Irregular heart rhythm   . Asthma   . Blood transfusion without reported diagnosis 2016   Past Surgical History  Procedure Laterality Date  . Cholecystectomy    . Tubal ligation     Family History  Problem Relation Age of Onset  . Diabetes Father   . Kidney disease  Father     failure due to diabetes  . Hypertension Mother    Social History  Substance Use Topics  . Smoking status: Former Smoker    Quit date: 05/13/2009  . Smokeless tobacco: Never Used  . Alcohol Use: 0.6 oz/week    1 Glasses of wine per week     Comment: occaional   OB History    Gravida Para Term Preterm AB TAB SAB Ectopic Multiple Living   5 4 2 2 1  1   4      Review of Systems All other systems negative unless otherwise stated in HPI    Allergies  Penicillins and Shrimp  Home Medications   Prior to Admission medications   Medication Sig Start Date End Date Taking? Authorizing Provider  acetaminophen (TYLENOL) 500 MG tablet Take 2 tablets (1,000 mg total) by mouth daily as needed for mild pain. Patient taking differently: Take 1,000 mg by mouth every 6 (six) hours as needed for mild pain.  08/12/14  Yes Modena Jansky, MD  Ascorbic Acid (VITAMIN C PO) Take 60 mg by mouth 3 (three) times daily.   Yes Historical Provider, MD  ranitidine (ZANTAC) 150 MG tablet Take 150 mg by mouth daily.   Yes Historical Provider, MD  ferrous sulfate 325 (65 FE) MG tablet Take 1 tablet (325 mg total) by mouth 2 (two) times daily with a meal. Patient taking differently: Take 325 mg by mouth daily with breakfast.  08/12/14  Modena Jansky, MD  hydrocortisone 2.5 % lotion Apply topically 2 (two) times daily. Patient not taking: Reported on 03/13/2015 06/22/14   Jarrett Soho Muthersbaugh, PA-C  LORazepam (ATIVAN) 0.5 MG tablet Take 0.5 mg by mouth at bedtime as needed for sleep.    Historical Provider, MD  naproxen (NAPROSYN) 500 MG tablet Take 1 tablet (500 mg total) by mouth 2 (two) times daily. Patient not taking: Reported on 03/13/2015 09/15/14   Britt Bottom, NP  norgestimate-ethinyl estradiol (ORTHO-CYCLEN,SPRINTEC,PREVIFEM) 0.25-35 MG-MCG tablet Take 1 tablet by mouth daily. Patient not taking: Reported on 03/13/2015 08/18/14   Peggy Constant, MD   BP 110/42 mmHg  Pulse 78   Temp(Src) 97.7 F (36.5 C)  Resp 20  Wt 187 lb (84.823 kg)  SpO2 100%  LMP 03/13/2015 Physical Exam  Constitutional: She is oriented to person, place, and time. She appears well-developed and well-nourished.  HENT:  Head: Normocephalic and atraumatic.  Mouth/Throat: Oropharynx is clear and moist.  Eyes: Pupils are equal, round, and reactive to light.  Neck: Normal range of motion. Neck supple.  Cardiovascular: Normal rate, regular rhythm and intact distal pulses.   No murmur heard. Capillary refill <3 seconds.   Pulmonary/Chest: Effort normal and breath sounds normal. No respiratory distress. She has no wheezes. She has no rales.  Abdominal: Soft. Bowel sounds are normal. She exhibits no distension. There is no tenderness.  Musculoskeletal: Normal range of motion.  Lymphadenopathy:    She has no cervical adenopathy.  Neurological: She is alert and oriented to person, place, and time.  Skin: Skin is warm and dry.  Psychiatric: She has a normal mood and affect. Her behavior is normal.    ED Course  Procedures (including critical care time) Labs Review Labs Reviewed  CBC WITH DIFFERENTIAL/PLATELET - Abnormal; Notable for the following:    WBC 3.3 (*)    Hemoglobin 10.3 (*)    HCT 32.8 (*)    MCV 70.7 (*)    MCH 22.2 (*)    RDW 16.1 (*)    Neutro Abs 1.4 (*)    All other components within normal limits  BASIC METABOLIC PANEL  I-STAT BETA HCG BLOOD, ED ([facility], [facility], [facility] ONLY)    Imaging Review No results found. I have personally reviewed and evaluated these images and lab results as part of my medical decision-making.   EKG Interpretation   Date/Time:  Monday March 13 2015 09:54:46 EDT Ventricular Rate:  77 PR Interval:  203 QRS Duration: 61 QT Interval:  369 QTC Calculation: 418 R Axis:   40 Text Interpretation:  Sinus rhythm Borderline prolonged PR interval since  last tracing no significant change Confirmed by Eulis Foster  MD, ELLIOTT ([id number])  on 03/13/2015 11:20:43  AM      MDM   Final diagnoses:  None    Patient presents with frank syncopal episode yesterday.  VSS, patient appears nontoxic, NAD.  SHe is not orthostatic.  On exam, capillary refill <3 seconds.    Imaging not performed.  Labs include hCG, CBC, BMP, EKG.  Hgb 10.3, MCV 70.7.  Suggests iron deficiency anemia.  Will ambulate in hallway.   Suspect anemia secondary to menorrhagia. Pt stable for d/c.  Advised to continue taking iron pills BID and follow up with OB/GYN.  Discussed return precautions and supportive care.  Patient acknowledges and agrees with the above plan.  Case has been discussed with and seen by Dr. Eulis Foster who agrees with the above plan for discharge.  Gloriann Loan, PA-C 03/13/15 1200  Daleen Bo, MD 03/13/15 2101

## 2015-03-13 NOTE — ED Notes (Signed)
Pt c/o heavy vaginal bleeding with hx of low hemoglobin. Pt states she feels her hemoglobin is low again related to symptoms of syncope, weak, and dizzy.

## 2015-03-13 NOTE — Discharge Instructions (Signed)
°  Continue taking your iron pills twice a day and follow up with [facility].  Anemia, Nonspecific Anemia is a condition in which the concentration of red blood cells or hemoglobin in the blood is below normal. Hemoglobin is a substance in red blood cells that carries oxygen to the tissues of the body. Anemia results in not enough oxygen reaching these tissues.  CAUSES  Common causes of anemia include:   Excessive bleeding. Bleeding may be internal or external. This includes excessive bleeding from periods (in women) or from the intestine.   Poor nutrition.   Chronic kidney, thyroid, and liver disease.  Bone marrow disorders that decrease red blood cell production.  Cancer and treatments for cancer.  HIV, AIDS, and their treatments.  Spleen problems that increase red blood cell destruction.  Blood disorders.  Excess destruction of red blood cells due to infection, medicines, and autoimmune disorders. SIGNS AND SYMPTOMS   Minor weakness.   Dizziness.   Headache.  Palpitations.   Shortness of breath, especially with exercise.   Paleness.  Cold sensitivity.  Indigestion.  Nausea.  Difficulty sleeping.  Difficulty concentrating. Symptoms may occur suddenly or they may develop slowly.  DIAGNOSIS  Additional blood tests are often needed. These help your health care provider determine the best treatment. Your health care provider will check your stool for blood and look for other causes of blood loss.  TREATMENT  Treatment varies depending on the cause of the anemia. Treatment can include:   Supplements of iron, vitamin O37, or folic acid.   Hormone medicines.   A blood transfusion. This may be needed if blood loss is severe.   Hospitalization. This may be needed if there is significant continual blood loss.   Dietary changes.  Spleen removal. HOME CARE INSTRUCTIONS Keep all follow-up appointments. It often takes many weeks to correct anemia,  and having your health care provider check on your condition and your response to treatment is very important. SEEK IMMEDIATE MEDICAL CARE IF:   You develop extreme weakness, shortness of breath, or chest pain.   You become dizzy or have trouble concentrating.  You develop heavy vaginal bleeding.   You develop a rash.   You have bloody or black, tarry stools.   You faint.   You vomit up blood.   You vomit repeatedly.   You have abdominal pain.  You have a fever or persistent symptoms for more than 2-3 days.   You have a fever and your symptoms suddenly get worse.   You are dehydrated.  MAKE SURE YOU:  Understand these instructions.  Will watch your condition.  Will get help right away if you are not doing well or get worse. Document Released: 07/18/2004 Document Revised: 02/10/2013 Document Reviewed: 12/04/2012 [facility] Patient Information 2015 [place], [place]. This information is not intended to replace advice given to you by your health care provider. Make sure you discuss any questions you have with your health care provider.

## 2015-05-12 ENCOUNTER — Emergency Department (HOSPITAL_COMMUNITY): Payer: 59

## 2015-05-12 ENCOUNTER — Emergency Department (HOSPITAL_COMMUNITY)
Admission: EM | Admit: 2015-05-12 | Discharge: 2015-05-12 | Disposition: A | Discharge status: Home / Self Care | Payer: 59 | Attending: Emergency Medicine | Admitting: Emergency Medicine

## 2015-05-12 ENCOUNTER — Encounter (HOSPITAL_COMMUNITY): Payer: Self-pay | Admitting: Emergency Medicine

## 2015-05-12 DIAGNOSIS — J45909 Unspecified asthma, uncomplicated: Secondary | ICD-10-CM | POA: Insufficient documentation

## 2015-05-12 DIAGNOSIS — Z79899 Other long term (current) drug therapy: Secondary | ICD-10-CM | POA: Insufficient documentation

## 2015-05-12 DIAGNOSIS — Z793 Long term (current) use of hormonal contraceptives: Secondary | ICD-10-CM | POA: Insufficient documentation

## 2015-05-12 DIAGNOSIS — Z880 Allergy status to penicillin: Secondary | ICD-10-CM | POA: Insufficient documentation

## 2015-05-12 DIAGNOSIS — Z87891 Personal history of nicotine dependence: Secondary | ICD-10-CM | POA: Insufficient documentation

## 2015-05-12 DIAGNOSIS — Z7952 Long term (current) use of systemic steroids: Secondary | ICD-10-CM | POA: Insufficient documentation

## 2015-05-12 DIAGNOSIS — R1084 Generalized abdominal pain: Secondary | ICD-10-CM | POA: Insufficient documentation

## 2015-05-12 DIAGNOSIS — Z8679 Personal history of other diseases of the circulatory system: Secondary | ICD-10-CM | POA: Insufficient documentation

## 2015-05-12 DIAGNOSIS — R42 Dizziness and giddiness: Secondary | ICD-10-CM | POA: Insufficient documentation

## 2015-05-12 DIAGNOSIS — F419 Anxiety disorder, unspecified: Secondary | ICD-10-CM | POA: Insufficient documentation

## 2015-05-12 DIAGNOSIS — Z8742 Personal history of other diseases of the female genital tract: Secondary | ICD-10-CM | POA: Insufficient documentation

## 2015-05-12 DIAGNOSIS — D649 Anemia, unspecified: Secondary | ICD-10-CM | POA: Insufficient documentation

## 2015-05-12 DIAGNOSIS — Z3202 Encounter for pregnancy test, result negative: Secondary | ICD-10-CM | POA: Insufficient documentation

## 2015-05-12 DIAGNOSIS — B349 Viral infection, unspecified: Secondary | ICD-10-CM

## 2015-05-12 DIAGNOSIS — H9209 Otalgia, unspecified ear: Secondary | ICD-10-CM | POA: Insufficient documentation

## 2015-05-12 LAB — POC URINE PREG, ED: Preg Test, Ur: NEGATIVE

## 2015-05-12 LAB — I-STAT CHEM 8, ED
BUN: 8 mg/dL (ref 6–20)
CALCIUM ION: 1.16 mmol/L (ref 1.12–1.23)
CHLORIDE: 102 mmol/L (ref 101–111)
Creatinine, Ser: 0.8 mg/dL (ref 0.44–1.00)
Glucose, Bld: 105 mg/dL — ABNORMAL HIGH (ref 65–99)
HEMATOCRIT: 31 % — AB (ref 36.0–46.0)
Hemoglobin: 10.5 g/dL — ABNORMAL LOW (ref 12.0–15.0)
Potassium: 3.6 mmol/L (ref 3.5–5.1)
SODIUM: 139 mmol/L (ref 135–145)
TCO2: 23 mmol/L (ref 0–100)

## 2015-05-12 NOTE — ED Provider Notes (Signed)
CSN: [id number]     Arrival date & time 05/12/15  [id number] History   First MD Initiated Contact with Patient 05/12/15 0827     Chief Complaint  Patient presents with  . Generalized Body Aches  . Fatigue  . URI     (Consider location/radiation/quality/duration/timing/severity/associated sxs/prior Treatment) HPI Comments: Tylenol severe, theraflu Diffuse body aches Abd pain, middle Headache Nausea Lightheaded   Patient is a 29 y.o. female presenting with cough.  Cough Cough characteristics:  Non-productive Duration:  5 days Timing:  Constant Progression:  Improving Associated symptoms: chills, ear pain, fever (subjective at home), headaches and myalgias   Associated symptoms: no chest pain, no rash, no shortness of breath and no sore throat   Risk factors comment:  Daughter also sick, mom has flu   Past Medical History  Diagnosis Date  . Menorrhagia   . Anemia   . Anxiety   . Irregular heart rhythm   . Asthma   . Blood transfusion without reported diagnosis 2016   Past Surgical History  Procedure Laterality Date  . Cholecystectomy    . Tubal ligation     Family History  Problem Relation Age of Onset  . Diabetes Father   . Kidney disease Father     failure due to diabetes  . Hypertension Mother    Social History  Substance Use Topics  . Smoking status: Former Smoker    Quit date: 05/13/2009  . Smokeless tobacco: Never Used  . Alcohol Use: 0.6 oz/week    1 Glasses of wine per week     Comment: occaional   OB History    Gravida Para Term Preterm AB TAB SAB Ectopic Multiple Living   5 4 2 2 1  1   4      Review of Systems  Constitutional: Positive for fever (subjective at home), chills and fatigue.  HENT: Positive for congestion and ear pain. Negative for sore throat.   Eyes: Negative for visual disturbance.  Respiratory: Positive for cough. Negative for shortness of breath.   Cardiovascular: Negative for chest pain.  Gastrointestinal: Positive for nausea  and abdominal pain. Negative for vomiting, diarrhea, constipation and blood in stool.  Genitourinary: Negative for difficulty urinating.  Musculoskeletal: Positive for myalgias and arthralgias. Negative for back pain and neck pain.  Skin: Negative for rash.  Neurological: Positive for light-headedness and headaches. Negative for syncope.      Allergies  Penicillins and Shrimp  Home Medications   Prior to Admission medications   Medication Sig Start Date End Date Taking? Authorizing Provider  acetaminophen (TYLENOL) 500 MG tablet Take 2 tablets (1,000 mg total) by mouth daily as needed for mild pain. Patient taking differently: Take 1,000 mg by mouth every 6 (six) hours as needed for mild pain.  08/12/14   Modena Jansky, MD  Ascorbic Acid (VITAMIN C PO) Take 60 mg by mouth 3 (three) times daily.    Historical Provider, MD  ferrous sulfate 325 (65 FE) MG tablet Take 1 tablet (325 mg total) by mouth 2 (two) times daily with a meal. Patient taking differently: Take 325 mg by mouth daily with breakfast.  08/12/14   Modena Jansky, MD  hydrocortisone 2.5 % lotion Apply topically 2 (two) times daily. Patient not taking: Reported on 03/13/2015 06/22/14   Jarrett Soho Muthersbaugh, PA-C  LORazepam (ATIVAN) 0.5 MG tablet Take 0.5 mg by mouth at bedtime as needed for sleep.    Historical Provider, MD  naproxen (NAPROSYN) 500 MG tablet Take  1 tablet (500 mg total) by mouth 2 (two) times daily. Patient not taking: Reported on 03/13/2015 09/15/14   Britt Bottom, NP  norgestimate-ethinyl estradiol (ORTHO-CYCLEN,SPRINTEC,PREVIFEM) 0.25-35 MG-MCG tablet Take 1 tablet by mouth daily. Patient not taking: Reported on 03/13/2015 08/18/14   Mora Bellman, MD  ranitidine (ZANTAC) 150 MG tablet Take 150 mg by mouth daily.    Historical Provider, MD   BP 102/88 mmHg  Pulse 68  Temp(Src) 97.6 F (36.4 C) (Oral)  Resp 16  SpO2 99%  LMP 05/07/2015 (Exact Date) Physical Exam  Constitutional: She is oriented  to person, place, and time. She appears well-developed and well-nourished. No distress.  HENT:  Head: Normocephalic and atraumatic.  Right Ear: Tympanic membrane is not injected.  Left Ear: Tympanic membrane is not injected.  Eyes: Conjunctivae and EOM are normal.  Neck: Normal range of motion.  Cardiovascular: Normal rate, regular rhythm, normal heart sounds and intact distal pulses.  Exam reveals no gallop and no friction rub.   No murmur heard. Pulmonary/Chest: Effort normal and breath sounds normal. No respiratory distress. She has no wheezes. She has no rales.  Abdominal: Soft. She exhibits no distension. There is tenderness (mild diffuse). There is no guarding, no CVA tenderness, no tenderness at McBurney's point and negative Murphy's sign.  Musculoskeletal: She exhibits no edema or tenderness.  Neurological: She is alert and oriented to person, place, and time. She has normal strength. No cranial nerve deficit or sensory deficit. Coordination and gait normal. GCS eye subscore is 4. GCS verbal subscore is 5. GCS motor subscore is 6.  Skin: Skin is warm and dry. No rash noted. She is not diaphoretic. No erythema.  Nursing note and vitals reviewed.   ED Course  Procedures (including critical care time) Labs Review Labs Reviewed  I-STAT CHEM 8, ED - Abnormal; Notable for the following:    Glucose, Bld 105 (*)    Hemoglobin 10.5 (*)    HCT 31.0 (*)    All other components within normal limits  POC URINE PREG, ED    Imaging Review Dg Chest 2 View  05/12/2015  CLINICAL DATA:  Cough. EXAM: CHEST  2 VIEW COMPARISON:  June 03, 2013. FINDINGS: The heart size and mediastinal contours are within normal limits. Both lungs are clear. The visualized skeletal structures are unremarkable. IMPRESSION: No active cardiopulmonary disease. Electronically Signed   By: Marijo Conception, M.D.   On: 05/12/2015 09:17   I have personally reviewed and evaluated these images and lab results as part of  my medical decision-making.   EKG Interpretation None      MDM   Final diagnoses:  Viral syndrome   29 year old female with history of anemia, menorrhagia presents with concern for generalized weakness, nonproductive cough.  CXR shows no sign of pneumonia.  Patient's vital signs are within normal limits. Istat chem 8 shows normal Na, K, Cl, Cr, glucose and hgb at baseline of 10.5. Upreg negative. No hx to suggest UTI, and no meningeal signs on exam. Abdominal tenderness is mild and nonfocal, and given presentation of diffuse body aches, multiple symptoms, likely secondary to viral etiology.  No signs of appendicitis/cholecystitis however discussed reasons to return to ED in detail.  Patient likely with viral syndrome. Discussed supportive care. Patient discharged in stable condition with understanding of reasons to return.     Gareth Morgan, MD 05/12/15 1017

## 2015-05-12 NOTE — ED Notes (Signed)
Pt returned from chest xray. NAD.

## 2015-05-12 NOTE — Discharge Instructions (Signed)
Viral Infections °A viral infection can be caused by different types of viruses. Most viral infections are not serious and resolve on their own. However, some infections may cause severe symptoms and may lead to further complications. °SYMPTOMS °Viruses can frequently cause: °· Minor sore throat. °· Aches and pains. °· Headaches. °· Runny nose. °· Different types of rashes. °· Watery eyes. °· Tiredness. °· Cough. °· Loss of appetite. °· Gastrointestinal infections, resulting in nausea, vomiting, and diarrhea. °These symptoms do not respond to antibiotics because the infection is not caused by bacteria. However, you might catch a bacterial infection following the viral infection. This is sometimes called a "superinfection." Symptoms of such a bacterial infection may include: °· Worsening sore throat with pus and difficulty swallowing. °· Swollen neck glands. °· Chills and a high or persistent fever. °· Severe headache. °· Tenderness over the sinuses. °· Persistent overall ill feeling (malaise), muscle aches, and tiredness (fatigue). °· Persistent cough. °· Yellow, green, or brown mucus production with coughing. °HOME CARE INSTRUCTIONS  °· Only take over-the-counter or prescription medicines for pain, discomfort, diarrhea, or fever as directed by your caregiver. °· Drink enough water and fluids to keep your urine clear or pale yellow. Sports drinks can provide valuable electrolytes, sugars, and hydration. °· Get plenty of rest and maintain proper nutrition. Soups and broths with crackers or rice are fine. °SEEK IMMEDIATE MEDICAL CARE IF:  °· You have severe headaches, shortness of breath, chest pain, neck pain, or an unusual rash. °· You have uncontrolled vomiting, diarrhea, or you are unable to keep down fluids. °· You or your child has an oral temperature above 102° F (38.9° C), not controlled by medicine. °· Your baby is older than 3 months with a rectal temperature of 102° F (38.9° C) or higher. °· Your baby is 3  months old or younger with a rectal temperature of 100.4° F (38° C) or higher. °MAKE SURE YOU:  °· Understand these instructions. °· Will watch your condition. °· Will get help right away if you are not doing well or get worse. °  °This information is not intended to replace advice given to you by your health care provider. Make sure you discuss any questions you have with your health care provider. °  °Document Released: 03/20/2005 Document Revised: 09/02/2011 Document Reviewed: 11/16/2014 °Elsevier Interactive Patient Education ©2016 [employer] ° °

## 2015-05-12 NOTE — ED Notes (Signed)
Pt to ER with complaint of generalized body aches with nonproductive cough x5 days. Pt reports possible fever, has not measured at home however. A/o x4. Afebrile at present time. VSS

## 2015-11-17 ENCOUNTER — Inpatient Hospital Stay (HOSPITAL_COMMUNITY)
Admission: AD | Admit: 2015-11-17 | Discharge: 2015-11-18 | Disposition: A | Discharge status: Home / Self Care | Payer: 59 | Source: Ambulatory Visit | Attending: Family Medicine | Admitting: Family Medicine

## 2015-11-17 ENCOUNTER — Encounter (HOSPITAL_COMMUNITY): Payer: Self-pay

## 2015-11-17 DIAGNOSIS — B373 Candidiasis of vulva and vagina: Secondary | ICD-10-CM | POA: Insufficient documentation

## 2015-11-17 DIAGNOSIS — Z3202 Encounter for pregnancy test, result negative: Secondary | ICD-10-CM | POA: Insufficient documentation

## 2015-11-17 DIAGNOSIS — Z87891 Personal history of nicotine dependence: Secondary | ICD-10-CM | POA: Insufficient documentation

## 2015-11-17 DIAGNOSIS — B3731 Acute candidiasis of vulva and vagina: Secondary | ICD-10-CM

## 2015-11-17 DIAGNOSIS — Z880 Allergy status to penicillin: Secondary | ICD-10-CM | POA: Insufficient documentation

## 2015-11-17 LAB — URINALYSIS, ROUTINE W REFLEX MICROSCOPIC
Bilirubin Urine: NEGATIVE
GLUCOSE, UA: NEGATIVE mg/dL
Ketones, ur: NEGATIVE mg/dL
LEUKOCYTES UA: NEGATIVE
Nitrite: NEGATIVE
PROTEIN: 100 mg/dL — AB
SPECIFIC GRAVITY, URINE: 1.025 (ref 1.005–1.030)
pH: 6.5 (ref 5.0–8.0)

## 2015-11-17 LAB — URINE MICROSCOPIC-ADD ON

## 2015-11-17 LAB — POCT PREGNANCY, URINE: Preg Test, Ur: NEGATIVE

## 2015-11-17 NOTE — MAU Note (Signed)
Wore some new underwear and next day had vag irritation. Used monistat insert one night and now having worse vag irritation and itching. Thick white d/c

## 2015-11-18 DIAGNOSIS — B373 Candidiasis of vulva and vagina: Secondary | ICD-10-CM

## 2015-11-18 LAB — WET PREP, GENITAL
Clue Cells Wet Prep HPF POC: NONE SEEN
Sperm: NONE SEEN
TRICH WET PREP: NONE SEEN
Yeast Wet Prep HPF POC: NONE SEEN

## 2015-11-18 MED ORDER — FLUCONAZOLE 150 MG PO TABS: ORAL_TABLET | ORAL | Status: DC

## 2015-11-18 NOTE — MAU Provider Note (Signed)
Chief Complaint: Vaginal Pain   None     SUBJECTIVE HPI: Breanna Miranda is a 30 y.o. [id number] who presents to maternity admissions reporting onset of vaginal itching/irriation 3 days ago after wearing a new pair of underwear.  She tried Monistat 1 but the medication caused burning and more irritation but the itching is still present.   She denies vaginal bleeding, urinary symptoms, h/a, dizziness, n/v, or fever/chills.     HPI  Past Medical History  Diagnosis Date  . Menorrhagia   . Anemia   . Anxiety   . Irregular heart rhythm   . Asthma   . Blood transfusion without reported diagnosis 2016   Past Surgical History  Procedure Laterality Date  . Cholecystectomy    . Tubal ligation     Social History   Social History  . Marital Status: Single    Spouse Name: N/A  . Number of Children: N/A  . Years of Education: N/A   Occupational History  . Not on file.   Social History Main Topics  . Smoking status: Former Smoker    Quit date: 05/13/2009  . Smokeless tobacco: Never Used  . Alcohol Use: 0.6 oz/week    1 Glasses of wine per week     Comment: occaional  . Drug Use: No  . Sexual Activity: Yes    Birth Control/ Protection: Surgical   Other Topics Concern  . Not on file   Social History Narrative   No current facility-administered medications on file prior to encounter.   Current Outpatient Prescriptions on File Prior to Encounter  Medication Sig Dispense Refill  . acetaminophen (TYLENOL) 500 MG tablet Take 2 tablets (1,000 mg total) by mouth daily as needed for mild pain. (Patient taking differently: Take 1,000 mg by mouth every 6 (six) hours as needed for mild pain. )    . Ascorbic Acid (VITAMIN C PO) Take 60 mg by mouth 3 (three) times daily.    . ferrous sulfate 325 (65 FE) MG tablet Take 1 tablet (325 mg total) by mouth 2 (two) times daily with a meal. (Patient taking differently: Take 325 mg by mouth daily with breakfast. ) 60 tablet 0  . LORazepam  (ATIVAN) 0.5 MG tablet Take 0.5 mg by mouth at bedtime as needed for sleep.    . ranitidine (ZANTAC) 150 MG tablet Take 150 mg by mouth daily.    . hydrocortisone 2.5 % lotion Apply topically 2 (two) times daily. (Patient not taking: Reported on 03/13/2015) 59 mL 0  . naproxen (NAPROSYN) 500 MG tablet Take 1 tablet (500 mg total) by mouth 2 (two) times daily. (Patient not taking: Reported on 03/13/2015) 30 tablet 0  . norgestimate-ethinyl estradiol (ORTHO-CYCLEN,SPRINTEC,PREVIFEM) 0.25-35 MG-MCG tablet Take 1 tablet by mouth daily. (Patient not taking: Reported on 03/13/2015) 1 Package 4   Allergies  Allergen Reactions  . Penicillins Anaphylaxis, Itching and Swelling    Swelling -tongue Has patient had a PCN reaction causing immediate rash, facial/tongue/throat swelling, SOB or lightheadedness with hypotension: Yes Has patient had a PCN reaction causing severe rash involving mucus membranes or skin necrosis: No Has patient had a PCN reaction that required hospitalization Yes Has patient had a PCN reaction occurring within the last 10 years: Yes If all of the above answers are "NO", then may proceed with Cephalosporin use.   . Shrimp [Shellfish Allergy] Anaphylaxis, Hives and Swelling    ROS:  Review of Systems  Constitutional: Negative for fever, chills and fatigue.  Respiratory:  Negative for shortness of breath.   Cardiovascular: Negative for chest pain.  Gastrointestinal: Negative for nausea, vomiting and abdominal pain.  Genitourinary: Positive for vaginal discharge and vaginal pain. Negative for dysuria, flank pain, vaginal bleeding, difficulty urinating and pelvic pain.  Neurological: Negative for dizziness and headaches.  Psychiatric/Behavioral: Negative.      I have reviewed patient's Past Medical Hx, Surgical Hx, Family Hx, Social Hx, medications and allergies.   Physical Exam  Patient Vitals for the past 24 hrs:  BP Temp Pulse Resp Height Weight  11/17/15 2137 117/61 mmHg  98.2 F (36.8 C) 80 18 5\' 4"  (1.626 m) 176 lb 9.6 oz (80.105 kg)   Constitutional: Well-developed, well-nourished female in no acute distress.  Cardiovascular: normal rate Respiratory: normal effort GI: Abd soft, non-tender. Pos BS x 4 MS: Extremities nontender, no edema, normal ROM Neurologic: Alert and oriented x 4.  GU: Neg CVAT.  PELVIC EXAM: wet prep/GCC collected by blind swab,  Large amount thick white discharge noted on exam   LAB RESULTS Results for orders placed or performed during the hospital encounter of 11/17/15 (from the past 24 hour(s))  Urinalysis, Routine w reflex microscopic (not at [facility])     Status: Abnormal   Collection Time: 11/17/15  9:45 PM  Result Value Ref Range   Color, Urine YELLOW YELLOW   APPearance CLEAR CLEAR   Specific Gravity, Urine 1.025 1.005 - 1.030   pH 6.5 5.0 - 8.0   Glucose, UA NEGATIVE NEGATIVE mg/dL   Hgb urine dipstick TRACE (A) NEGATIVE   Bilirubin Urine NEGATIVE NEGATIVE   Ketones, ur NEGATIVE NEGATIVE mg/dL   Protein, ur 100 (A) NEGATIVE mg/dL   Nitrite NEGATIVE NEGATIVE   Leukocytes, UA NEGATIVE NEGATIVE  Urine microscopic-add on     Status: Abnormal   Collection Time: 11/17/15  9:45 PM  Result Value Ref Range   Squamous Epithelial / LPF 0-5 (A) NONE SEEN   WBC, UA 0-5 0 - 5 WBC/hpf   RBC / HPF 0-5 0 - 5 RBC/hpf   Bacteria, UA FEW (A) NONE SEEN   Urine-Other MUCOUS PRESENT   Pregnancy, urine POC     Status: None   Collection Time: 11/17/15  9:56 PM  Result Value Ref Range   Preg Test, Ur NEGATIVE NEGATIVE  Wet prep, genital     Status: Abnormal   Collection Time: 11/17/15 10:49 PM  Result Value Ref Range   Yeast Wet Prep HPF POC NONE SEEN NONE SEEN   Trich, Wet Prep NONE SEEN NONE SEEN   Clue Cells Wet Prep HPF POC NONE SEEN NONE SEEN   WBC, Wet Prep HPF POC MODERATE (A) NONE SEEN   Sperm NONE SEEN        IMAGING No results found.  MAU Management/MDM: Ordered labs and reviewed results.  Wet prep negative but  physical exam c/w vaginal yeast infection.  Treat with Diflucan 150 mg, one dose now and one dose in 3 days.  F/U with Gyn provider if symptoms persist.  Pt stable at time of discharge.  ASSESSMENT 1. Vagina, candidiasis     PLAN Discharge home    Medication List    STOP taking these medications        hydrocortisone 2.5 % lotion     naproxen 500 MG tablet  Commonly known as:  NAPROSYN     norgestimate-ethinyl estradiol 0.25-35 MG-MCG tablet  Commonly known as:  ORTHO-CYCLEN,SPRINTEC,PREVIFEM      TAKE these medications  acetaminophen 500 MG tablet  Commonly known as:  TYLENOL  Take 2 tablets (1,000 mg total) by mouth daily as needed for mild pain.     ferrous sulfate 325 (65 FE) MG tablet  Take 1 tablet (325 mg total) by mouth 2 (two) times daily with a meal.     fluconazole 150 MG tablet  Commonly known as:  DIFLUCAN  Take one tablet now and one tablet in 3 days.     LORazepam 0.5 MG tablet  Commonly known as:  ATIVAN  Take 0.5 mg by mouth at bedtime as needed for sleep.     ranitidine 150 MG tablet  Commonly known as:  ZANTAC  Take 150 mg by mouth daily.     VITAMIN C PO  Take 60 mg by mouth 3 (three) times daily.     Vitamin D 2000 units Caps  Take 1 capsule by mouth daily.           Follow-up Information    Please follow up.   Why:  With your Gyn provider as needed, Return to MAU as needed for emergencies      Fatima Blank Certified Nurse-Midwife 11/18/2015  12:35 AM

## 2015-11-18 NOTE — Discharge Instructions (Signed)
Monilial Vaginitis Vaginitis in a soreness, swelling and redness (inflammation) of the vagina and vulva. Monilial vaginitis is not a sexually transmitted infection. CAUSES  Yeast vaginitis is caused by yeast (candida) that is normally found in your vagina. With a yeast infection, the candida has overgrown in number to a point that upsets the chemical balance. SYMPTOMS   White, thick vaginal discharge.  Swelling, itching, redness and irritation of the vagina and possibly the lips of the vagina (vulva).  Burning or painful urination.  Painful intercourse. DIAGNOSIS  Things that may contribute to monilial vaginitis are:  Postmenopausal and virginal states.  Pregnancy.  Infections.  Being tired, sick or stressed, especially if you had monilial vaginitis in the past.  Diabetes. Good control will help lower the chance.  Birth control pills.  Tight fitting garments.  Using bubble bath, feminine sprays, douches or deodorant tampons.  Taking certain medications that kill germs (antibiotics).  Sporadic recurrence can occur if you become ill. TREATMENT  Your caregiver will give you medication.  There are several kinds of anti monilial vaginal creams and suppositories specific for monilial vaginitis. For recurrent yeast infections, use a suppository or cream in the vagina 2 times a week, or as directed.  Anti-monilial or steroid cream for the itching or irritation of the vulva may also be used. Get your caregiver's permission.  Painting the vagina with methylene blue solution may help if the monilial cream does not work.  Eating yogurt may help prevent monilial vaginitis. HOME CARE INSTRUCTIONS   Finish all medication as prescribed.  Do not have sex until treatment is completed or after your caregiver tells you it is okay.  Take warm sitz baths.  Do not douche.  Do not use tampons, especially scented ones.  Wear cotton underwear.  Avoid tight pants and panty  hose.  Tell your sexual partner that you have a yeast infection. They should go to their caregiver if they have symptoms such as mild rash or itching.  Your sexual partner should be treated as well if your infection is difficult to eliminate.  Practice safer sex. Use condoms.  Some vaginal medications cause latex condoms to fail. Vaginal medications that harm condoms are:  Cleocin cream.  Butoconazole (Femstat).  Terconazole (Terazol) vaginal suppository.  Miconazole (Monistat) (may be purchased over the counter). SEEK MEDICAL CARE IF:   You have a temperature by mouth above 102 F (38.9 C).  The infection is getting worse after 2 days of treatment.  The infection is not getting better after 3 days of treatment.  You develop blisters in or around your vagina.  You develop vaginal bleeding, and it is not your menstrual period.  You have pain when you urinate.  You develop intestinal problems.  You have pain with sexual intercourse.   This information is not intended to replace advice given to you by your health care provider. Make sure you discuss any questions you have with your health care provider.   Document Released: 03/20/2005 Document Revised: 09/02/2011 Document Reviewed: 12/12/2014 Elsevier Interactive Patient Education 2016 Else[employer] Inc[employer]  Probiotics WHAT ARE PROBIOTICS? Probiotics are the good bacteria and yeasts that live in your body and keep you and your digestive system healthy. Probiotics also help your body's defense (immune) system and protect your body against bad bacterial growth.  Certain foods contain probiotics, such as yogurt. Probiotics can also be purchased as a supplement. As with any supplement or drug, it is important to discuss its use with your health care  provider.  WHAT AFFECTS THE BALANCE OF BACTERIA IN MY BODY? The balance of bacteria in your body can be affected by:   Antibiotic medicines. Antibiotics are sometimes necessary to  treat infection. Unfortunately, they may kill good or friendly bacteria in your body as well as the bad bacteria. This may lead to stomach problems like diarrhea, gas, and cramping.  Disease. Some conditions are the result of an overgrowth of bad bacteria, yeasts, parasites, or fungi. These conditions include:   Infectious diarrhea.  Stomach and respiratory infections.  Skin infections.  Irritable bowel syndrome (IBS).  Inflammatory bowel diseases.  Ulcer due to Helicobacter pylori (H. pylori) infection.  Tooth decay and periodontal disease.  Vaginal infections. Stress and poor diet may also lower the good bacteria in your body.  WHAT TYPE OF PROBIOTIC IS RIGHT FOR ME? Probiotics are available over the counter at your local pharmacy, health food, or grocery store. They come in many different forms, combinations of strains, and dosing strengths. Some may need to be refrigerated. Always read the label for storage and usage instructions. Specific strains have been shown to be more effective for certain conditions. Ask your health care provider what option is best for you.  WHY WOULD I NEED PROBIOTICS? There are many reasons your health care provider might recommend a probiotic supplement, including:   Diarrhea.  Constipation.  IBS.  Respiratory infections.  Yeast infections.  Acne, eczema, and other skin conditions.  Frequent urinary tract infections (UTIs). ARE THERE SIDE EFFECTS OF PROBIOTICS? Some people experience mild side effects when taking probiotics. Side effects are usually temporary and may include:   Gas.  Bloating.  Cramping. Rarely, serious side effects, such as infection or immune system changes, may occur. WHAT ELSE DO I NEED TO KNOW ABOUT PROBIOTICS?   There are many different strains of probiotics. Certain strains may be more effective depending on your condition. Probiotics are available in varying doses. Ask your health care provider which probiotic  you should use and how often.   If you are taking probiotics along with antibiotics, it is generally recommended to wait at least 2 hours between taking the antibiotic and taking the probiotic.  FOR MORE INFORMATION:  [facility] [email or web address]   This information is not intended to replace advice given to you by your health care provider. Make sure you discuss any questions you have with your health care provider.   Document Released: 01/05/2014 Document Reviewed: 01/05/2014 Elsevier Interactive Patient Education [employer].

## 2015-11-21 ENCOUNTER — Telehealth (HOSPITAL_COMMUNITY): Payer: Self-pay | Admitting: *Deleted

## 2015-11-21 LAB — GC/CHLAMYDIA PROBE AMP (~~LOC~~) NOT AT ARMC
CHLAMYDIA, DNA PROBE: NEGATIVE
NEISSERIA GONORRHEA: NEGATIVE

## 2016-04-01 ENCOUNTER — Encounter (HOSPITAL_COMMUNITY): Payer: Self-pay | Admitting: *Deleted

## 2016-04-01 ENCOUNTER — Inpatient Hospital Stay (HOSPITAL_COMMUNITY)
Admission: AD | Admit: 2016-04-01 | Discharge: 2016-04-01 | Disposition: A | Discharge status: Home / Self Care | Payer: 59 | Source: Ambulatory Visit | Attending: Obstetrics and Gynecology | Admitting: Obstetrics and Gynecology

## 2016-04-01 DIAGNOSIS — Z113 Encounter for screening for infections with a predominantly sexual mode of transmission: Secondary | ICD-10-CM | POA: Insufficient documentation

## 2016-04-01 DIAGNOSIS — A599 Trichomoniasis, unspecified: Secondary | ICD-10-CM

## 2016-04-01 DIAGNOSIS — Z880 Allergy status to penicillin: Secondary | ICD-10-CM | POA: Insufficient documentation

## 2016-04-01 DIAGNOSIS — A5901 Trichomonal vulvovaginitis: Secondary | ICD-10-CM | POA: Insufficient documentation

## 2016-04-01 DIAGNOSIS — Z87891 Personal history of nicotine dependence: Secondary | ICD-10-CM | POA: Insufficient documentation

## 2016-04-01 LAB — URINALYSIS, ROUTINE W REFLEX MICROSCOPIC
Bilirubin Urine: NEGATIVE
GLUCOSE, UA: NEGATIVE mg/dL
HGB URINE DIPSTICK: NEGATIVE
Ketones, ur: NEGATIVE mg/dL
Nitrite: NEGATIVE
PH: 6.5 (ref 5.0–8.0)
PROTEIN: NEGATIVE mg/dL
Specific Gravity, Urine: 1.02 (ref 1.005–1.030)

## 2016-04-01 LAB — CBC
HCT: 31.9 % — ABNORMAL LOW (ref 36.0–46.0)
HEMOGLOBIN: 9.8 g/dL — AB (ref 12.0–15.0)
MCH: 20.4 pg — AB (ref 26.0–34.0)
MCHC: 30.7 g/dL (ref 30.0–36.0)
MCV: 66.5 fL — AB (ref 78.0–100.0)
Platelets: 249 10*3/uL (ref 150–400)
RBC: 4.8 MIL/uL (ref 3.87–5.11)
RDW: 26.9 % — ABNORMAL HIGH (ref 11.5–15.5)
WBC: 4.2 10*3/uL (ref 4.0–10.5)

## 2016-04-01 LAB — WET PREP, GENITAL
Clue Cells Wet Prep HPF POC: NONE SEEN
SPERM: NONE SEEN
YEAST WET PREP: NONE SEEN

## 2016-04-01 LAB — URINE MICROSCOPIC-ADD ON

## 2016-04-01 LAB — POCT PREGNANCY, URINE: Preg Test, Ur: NEGATIVE

## 2016-04-01 MED ORDER — METRONIDAZOLE 500 MG PO TABS
2000.0000 mg | ORAL_TABLET | Freq: Once | ORAL | Status: AC
  Administered 2016-04-01: 2000 mg via ORAL
  Filled 2016-04-01: qty 4

## 2016-04-01 NOTE — MAU Provider Note (Signed)
History     CSN: [id number]  Arrival date and time: 04/01/16 1328   First Provider Initiated Contact with Patient 04/01/16 1353      Chief Complaint  Patient presents with  . vaginal odor   Breanna Miranda is a 30 y.o. Female who presents for vaginal discharge. Requesting STD testing today.    Female GU Problem  The patient's primary symptoms include a genital odor and vaginal discharge. The patient's pertinent negatives include no genital itching, genital lesions, missed menses, pelvic pain or vaginal bleeding. This is a new problem. The current episode started in the past 7 days. The problem occurs constantly. The problem has been unchanged. The patient is experiencing no pain. She is not pregnant. Pertinent negatives include no abdominal pain, chills, constipation, diarrhea, discolored urine, dysuria, fever, frequency, nausea, painful intercourse, urgency or vomiting. The vaginal discharge was thin, yellow and malodorous. There has been no bleeding. Nothing aggravates the symptoms. She has tried nothing for the symptoms. She is sexually active (not for the last month). No, her partner does not have an STD. She uses nothing for contraception. Her menstrual history has been regular.   OB History    Gravida Para Term Preterm AB Living   5 4 2 2 1 4    SAB TAB Ectopic Multiple Live Births   1 0 0 0 4      Past Medical History:  Diagnosis Date  . Anemia   . Anxiety   . Asthma   . Blood transfusion without reported diagnosis 2016  . Irregular heart rhythm   . Menorrhagia     Past Surgical History:  Procedure Laterality Date  . CHOLECYSTECTOMY    . TUBAL LIGATION      Family History  Problem Relation Age of Onset  . Diabetes Father   . Kidney disease Father     failure due to diabetes  . Hypertension Mother     Social History  Substance Use Topics  . Smoking status: Former Smoker    Quit date: 05/13/2009  . Smokeless tobacco: Never Used  . Alcohol use 0.6  oz/week    1 Glasses of wine per week     Comment: occaional    Allergies:  Allergies  Allergen Reactions  . Penicillins Anaphylaxis, Itching and Swelling    Swelling -tongue Has patient had a PCN reaction causing immediate rash, facial/tongue/throat swelling, SOB or lightheadedness with hypotension: Yes Has patient had a PCN reaction causing severe rash involving mucus membranes or skin necrosis: No Has patient had a PCN reaction that required hospitalization Yes Has patient had a PCN reaction occurring within the last 10 years: Yes If all of the above answers are "NO", then may proceed with Cephalosporin use.   . Shrimp [Shellfish Allergy] Anaphylaxis, Hives and Swelling    Prescriptions Prior to Admission  Medication Sig Dispense Refill Last Dose  . acetaminophen (TYLENOL) 500 MG tablet Take 2 tablets (1,000 mg total) by mouth daily as needed for mild pain. (Patient taking differently: Take 1,000 mg by mouth every 6 (six) hours as needed for mild pain. )   11/17/2015 at Unknown time  . Ascorbic Acid (VITAMIN C PO) Take 60 mg by mouth 3 (three) times daily.   11/17/2015 at Unknown time  . Cholecalciferol (VITAMIN D) 2000 units CAPS Take 1 capsule by mouth daily.   11/17/2015 at Unknown time  . ferrous sulfate 325 (65 FE) MG tablet Take 1 tablet (325 mg total) by mouth 2 (two)  times daily with a meal. (Patient taking differently: Take 325 mg by mouth daily with breakfast. ) 60 tablet 0 11/17/2015 at Unknown time  . fluconazole (DIFLUCAN) 150 MG tablet Take one tablet now and one tablet in 3 days. 2 tablet 0   . LORazepam (ATIVAN) 0.5 MG tablet Take 0.5 mg by mouth at bedtime as needed for sleep.   Past Week at Unknown time  . ranitidine (ZANTAC) 150 MG tablet Take 150 mg by mouth daily.   11/17/2015 at Unknown time    Review of Systems  Constitutional: Negative for chills and fever.  Gastrointestinal: Negative for abdominal pain, constipation, diarrhea, nausea and vomiting.   Genitourinary: Positive for vaginal discharge. Negative for dysuria, frequency, missed menses, pelvic pain and urgency.       + vaginal discharge No dyspareunia or post coital bleeding   Physical Exam   Blood pressure (!) 104/49, pulse 85, temperature 98.3 F (36.8 C), temperature source Oral, resp. rate 16, SpO2 100 %.  Physical Exam  Nursing note and vitals reviewed. Constitutional: She is oriented to person, place, and time. She appears well-developed and well-nourished. No distress.  HENT:  Head: Normocephalic and atraumatic.  Eyes: Conjunctivae are normal. Right eye exhibits no discharge. Left eye exhibits no discharge. No scleral icterus.  Neck: Normal range of motion.  Respiratory: No respiratory distress.  GI: Soft. She exhibits no distension. There is no tenderness.  Genitourinary: Cervix exhibits discharge (moderate amount of clear frothy discharge, no odor appreciated). Cervix exhibits no motion tenderness and no friability. No bleeding in the vagina. Vaginal discharge found.  Neurological: She is alert and oriented to person, place, and time.  Skin: Skin is warm and dry. She is not diaphoretic.  Psychiatric: She has a normal mood and affect. Her behavior is normal. Judgment and thought content normal.    MAU Course  Procedures Results for orders placed or performed during the hospital encounter of 04/01/16 (from the past 24 hour(s))  Urinalysis, Routine w reflex microscopic (not at [facility])     Status: Abnormal   Collection Time: 04/01/16  1:37 PM  Result Value Ref Range   Color, Urine YELLOW YELLOW   APPearance HAZY (A) CLEAR   Specific Gravity, Urine 1.020 1.005 - 1.030   pH 6.5 5.0 - 8.0   Glucose, UA NEGATIVE NEGATIVE mg/dL   Hgb urine dipstick NEGATIVE NEGATIVE   Bilirubin Urine NEGATIVE NEGATIVE   Ketones, ur NEGATIVE NEGATIVE mg/dL   Protein, ur NEGATIVE NEGATIVE mg/dL   Nitrite NEGATIVE NEGATIVE   Leukocytes, UA SMALL (A) NEGATIVE  Urine microscopic-add on      Status: Abnormal   Collection Time: 04/01/16  1:37 PM  Result Value Ref Range   Squamous Epithelial / LPF 6-30 (A) NONE SEEN   WBC, UA 6-30 0 - 5 WBC/hpf   RBC / HPF 0-5 0 - 5 RBC/hpf   Bacteria, UA MANY (A) NONE SEEN   Urine-Other TRICHOMONAS PRESENT   Pregnancy, urine POC     Status: None   Collection Time: 04/01/16  1:42 PM  Result Value Ref Range   Preg Test, Ur NEGATIVE NEGATIVE  CBC     Status: Abnormal   Collection Time: 04/01/16  2:02 PM  Result Value Ref Range   WBC 4.2 4.0 - 10.5 K/uL   RBC 4.80 3.87 - 5.11 MIL/uL   Hemoglobin 9.8 (L) 12.0 - 15.0 g/dL   HCT 31.9 (L) 36.0 - 46.0 %   MCV 66.5 (L) 78.0 - 100.0  fL   MCH 20.4 (L) 26.0 - 34.0 pg   MCHC 30.7 30.0 - 36.0 g/dL   RDW 26.9 (H) 11.5 - 15.5 %   Platelets 249 150 - 400 K/uL  Wet prep, genital     Status: Abnormal   Collection Time: 04/01/16  2:15 PM  Result Value Ref Range   Yeast Wet Prep HPF POC NONE SEEN NONE SEEN   Trich, Wet Prep PRESENT (A) NONE SEEN   Clue Cells Wet Prep HPF POC NONE SEEN NONE SEEN   WBC, Wet Prep HPF POC FEW (A) NONE SEEN   Sperm NONE SEEN     MDM UPT negative CBC, HIV, RPR, GC/CT, wet prep Flagyl 2 gm PO in MAU for trich tx Assessment and Plan  A: 1. Trichomonas infection   2. Screen for STD (sexually transmitted disease)    P: Discharge home No intercourse x 1 week Expedited partner treatment info & rx given    Jorje Guild 04/01/2016, 1:53 PM

## 2016-04-01 NOTE — Discharge Instructions (Signed)
Trichomoniasis °Trichomoniasis is an infection caused by an organism called Trichomonas. The infection can affect both women and men. In women, the outer female genitalia and the vagina are affected. In men, the penis is mainly affected, but the prostate and other reproductive organs can also be involved. Trichomoniasis is a sexually transmitted infection (STI) and is most often passed to another person through sexual contact.  °RISK FACTORS °· Having unprotected sexual intercourse. °· Having sexual intercourse with an infected partner. °SIGNS AND SYMPTOMS  °Symptoms of trichomoniasis in women include: °· Abnormal gray-green frothy vaginal discharge. °· Itching and irritation of the vagina. °· Itching and irritation of the area outside the vagina. °Symptoms of trichomoniasis in men include:  °· Penile discharge with or without pain. °· Pain during urination. This results from inflammation of the urethra. °DIAGNOSIS  °Trichomoniasis may be found during a Pap test or physical exam. Your health care provider may use one of the following methods to help diagnose this infection: °· Testing the pH of the vagina with a test tape. °· Using a vaginal swab test that checks for the Trichomonas organism. A test is available that provides results within a few minutes. °· Examining a urine sample. °· Testing vaginal secretions. °Your health care provider may test you for other STIs, including HIV. °TREATMENT  °· You may be given medicine to fight the infection. Women should inform their health care provider if they could be or are pregnant. Some medicines used to treat the infection should not be taken during pregnancy. °· Your health care provider may recommend over-the-counter medicines or creams to decrease itching or irritation. °· Your sexual partner will need to be treated if infected. °· Your health care provider may test you for infection again 3 months after treatment. °HOME CARE INSTRUCTIONS  °· Take medicines only as  directed by your health care provider. °· Take over-the-counter medicine for itching or irritation as directed by your health care provider. °· Do not have sexual intercourse while you have the infection. °· Women should not douche or wear tampons while they have the infection. °· Discuss your infection with your partner. Your partner may have gotten the infection from you, or you may have gotten it from your partner. °· Have your sex partner get examined and treated if necessary. °· Practice safe, informed, and protected sex. °· See your health care provider for other STI testing. °SEEK MEDICAL CARE IF:  °· You still have symptoms after you finish your medicine. °· You develop abdominal pain. °· You have pain when you urinate. °· You have bleeding after sexual intercourse. °· You develop a rash. °· Your medicine makes you sick or makes you throw up (vomit). °MAKE SURE YOU: °· Understand these instructions. °· Will watch your condition. °· Will get help right away if you are not doing well or get worse. °  °This information is not intended to replace advice given to you by your health care provider. Make sure you discuss any questions you have with your health care provider. °  °Document Released: 12/04/2000 Document Revised: 07/01/2014 Document Reviewed: 03/22/2013 °Elsevier Interactive Patient Education ©2016 [employer] ° °Expedited Partner Therapy:  °Information Sheet for Patients and Partners  °            ° °You have been offered expedited partner therapy (EPT). This information sheet contains important information and warnings you need to be aware of, so please read it carefully.  ° °Expedited Partner Therapy (EPT) is the   clinical practice of treating the sexual partners of persons who receive chlamydia, gonorrhea, or trichomoniasis diagnoses by providing medications or prescriptions to the patient. Patients then provide partners with these therapies without the health-care provider having examined the  partner. In other words, EPT is a convenient, fast and private way for patients to help their sexual partners get treated.  ° °Chlamydia and gonorrhea are bacterial infections you get from having sex with a person who is already infected. Trichomoniasis (or “trich”) is a very common sexually transmitted infection (STI) that is caused by infection with a protozoan parasite called Trichomonas vaginalis.  Many people with these infections don’t know it because they feel fine, but without treatment these infections can cause serious health problems, such as pelvic inflammatory disease, ectopic pregnancy, infertility and increased risk of HIV.  ° °It is important to get treated as soon as possible to protect your health, to avoid spreading these infections to others, and to prevent yourself from becoming re-infected. The good news is these infections can be easily cured with proper antibiotic medicine. The best way to take care of your self is to see a doctor or go to your local health department. If you are not able to see a doctor or other medical provider, you should take EPT.  ° ° °Recommended Medication: °EPT for Chlamydia:  Azithromycin (Zithromax) 1 gram orally in a single dose °EPT for Gonorrhea:  Cefixime (Suprax) 400 milligrams orally in a single dose PLUS azithromycin (Zithromax) 1 gram orally in a single dose °EPT for Trichomoniasis:  Metronidazole (Flagyl) 2 grams orally in a single dose ° ° °These medicines are very safe. However, you should not take them if you have ever had an allergic reaction (like a rash) to any of these medicines: azithromycin (Zithromax), erythromycin, clarithromycin (Biaxin), metronidazole (Flagyl), tinidazole (Tindimax). If you are uncertain about whether you have an allergy, call your medical provider or pharmacist before taking this medicine. If you have a serious, long-term illness like kidney, liver or heart disease, colitis or stomach problems, or you are currently taking  other prescription medication, talk to your provider before taking this medication.  ° °Women: If you have lower belly pain, pain during sex, vomiting, or a fever, do not take this medicine. Instead, you should see a medical provider to be certain you do not have pelvic inflammatory disease (PID). PID can be serious and lead to infertility, pregnancy problems or chronic pelvic pain.  ° °Pregnant Women: It is very important for you to see a doctor to get pregnancy services and pre-natal care. These antibiotics for EPT are safe for pregnant women, but you still need to see a medical provider as soon as possible. It is also important to note that Doxycycline is an alternative therapy for chlamydia, but it should not be taken by someone who is pregnant.  ° °Men: If you have pain or swelling in the testicles or a fever, do not take this medicine and see a medical provider.    ° °Men who have sex with men (MSM): MSM in [place] continue to experience high rates of syphilis and HIV. Many MSM with gonorrhea or chlamydia could also have syphilis and/or HIV and not know it. If you are a man who has sex with other men, it is very important that you see a medical provider and are tested for HIV and syphilis. EPT is not recommended for gonorrhea for MSM.  Recommended treatment for gonorrhea for MSM is Rocephin (shot) AND   azithromycin due to decreased cure rate.  Please see your medical provider if this is the case.   ° °Along with this information sheet is a prescription for the medicine. If you receive a prescription it will be in your name and will indicate your date of birth, or it will be in the name of “Expedited Partner Therapy”.   In either case, you can have the prescription filled at a pharmacy. You will be responsible for the cost of the medicine, unless you have prescription drug coverage. In that case, you could provide your name so the pharmacy could bill your health plan.  ° °Take the medication as directed.  Some people will have a mild, upset stomach, which does not last long. AVOID alcohol 24 hours after taking metronidazole (Flagyl) to reduce the possibility of a disulfiram-like reaction (severe vomiting and abdominal pain).  After taking the medicine, do not have sex for 7 days. Do not share this medicine or give it to anyone else. It is important to tell everyone you have had sex with in the last 60 days that they need to go and get tested for sexually transmitted infections.  ° °Ways to prevent these and other sexually transmitted infections (STIs):  ° °• Abstain from sex. This is the only sure way to avoid getting an STI.  °• Use barrier methods, such as condoms, consistently and correctly.  °• Limit the number of sexual partners.  °• Have regular physical exams, including testing for STIs.  ° °For more information about EPT or other issues pertaining to an STI, please contact your medical provider or the [place] [facility] at [phone] or [email or web address]   ° °

## 2016-04-01 NOTE — MAU Note (Signed)
Patient states her LMP was 10/3 and she finished two days ago.  Yesterday she started to notice a foul vaginal odor with some yellow discharge.   Denies any vaginal itching or burning.

## 2016-04-02 LAB — HIV ANTIBODY (ROUTINE TESTING W REFLEX): HIV SCREEN 4TH GENERATION: NONREACTIVE

## 2016-04-02 LAB — GC/CHLAMYDIA PROBE AMP (~~LOC~~) NOT AT ARMC
CHLAMYDIA, DNA PROBE: NEGATIVE
Neisseria Gonorrhea: NEGATIVE

## 2016-04-02 LAB — RPR: RPR: NONREACTIVE

## 2016-07-09 ENCOUNTER — Encounter (HOSPITAL_COMMUNITY): Payer: Self-pay | Admitting: Family Medicine

## 2016-07-09 ENCOUNTER — Ambulatory Visit (HOSPITAL_COMMUNITY)
Admission: EM | Admit: 2016-07-09 | Discharge: 2016-07-09 | Disposition: A | Discharge status: Home / Self Care | Payer: 59 | Attending: Family Medicine | Admitting: Family Medicine

## 2016-07-09 ENCOUNTER — Ambulatory Visit (INDEPENDENT_AMBULATORY_CARE_PROVIDER_SITE_OTHER): Payer: 59

## 2016-07-09 DIAGNOSIS — J069 Acute upper respiratory infection, unspecified: Secondary | ICD-10-CM | POA: Diagnosis not present

## 2016-07-09 DIAGNOSIS — B9789 Other viral agents as the cause of diseases classified elsewhere: Secondary | ICD-10-CM | POA: Diagnosis not present

## 2016-07-09 MED ORDER — GUAIFENESIN-CODEINE 100-10 MG/5ML PO SYRP: 10.0000 mL | ORAL_SOLUTION | Freq: Four times a day (QID) | ORAL | 0 refills | Status: DC | PRN

## 2016-07-09 MED ORDER — IPRATROPIUM BROMIDE 0.06 % NA SOLN: 2.0000 | Freq: Four times a day (QID) | NASAL | 1 refills | Status: DC

## 2016-07-09 NOTE — ED Triage Notes (Signed)
Pt her for cough, congestion, bilateral ear pain. sts she took 2 motrin this am. sts cough worse at night. sts started last week.

## 2016-07-09 NOTE — ED Provider Notes (Signed)
[facility]    CSN: [id number] Arrival date & time: 07/09/16  1055     History   Chief Complaint Chief Complaint  Patient presents with  . Cough  . Otalgia    HPI Breanna Miranda is a 31 y.o. female.   The history is provided by the patient.  Cough  Cough characteristics:  Non-productive and harsh Onset quality:  Gradual Progression:  Worsening Chronicity:  New Smoker: no   Context: upper respiratory infection   Associated symptoms: ear pain   Otalgia  Associated symptoms: cough     Past Medical History:  Diagnosis Date  . Anemia   . Anxiety   . Asthma   . Blood transfusion without reported diagnosis 2016  . Irregular heart rhythm   . Menorrhagia     Patient Active Problem List   Diagnosis Date Noted  . Syncope 08/12/2014  . Menorrhagia 08/10/2014  . Anxiety 08/10/2014  . Anemia 05/06/2012    Past Surgical History:  Procedure Laterality Date  . CHOLECYSTECTOMY    . TUBAL LIGATION      OB History    Gravida Para Term Preterm AB Living   5 4 2 2 1 4    SAB TAB Ectopic Multiple Live Births   1 0 0 0 4       Home Medications    Prior to Admission medications   Medication Sig Start Date End Date Taking? Authorizing Provider  acetaminophen (TYLENOL) 325 MG tablet Take 650 mg by mouth every 6 (six) hours as needed for mild pain, moderate pain or headache.    Historical Provider, MD  cholecalciferol (VITAMIN D) 1000 units tablet Take 2,000 Units by mouth daily.    Historical Provider, MD  ferrous sulfate 325 (65 FE) MG tablet Take 325 mg by mouth daily with breakfast.    Historical Provider, MD  guaiFENesin-codeine (ROBITUSSIN AC) 100-10 MG/5ML syrup Take 10 mLs by mouth 4 (four) times daily as needed for cough. 07/09/16   Billy Fischer, MD  ipratropium (ATROVENT) 0.06 % nasal spray Place 2 sprays into both nostrils 4 (four) times daily. 07/09/16   Billy Fischer, MD  LORazepam (ATIVAN) 0.5 MG tablet Take 0.5 mg by mouth at bedtime as  needed for sleep.    Historical Provider, MD  ranitidine (ZANTAC) 150 MG tablet Take 150 mg by mouth daily.    Historical Provider, MD  vitamin C (ASCORBIC ACID) 500 MG tablet Take 500 mg by mouth 3 (three) times daily.    Historical Provider, MD    Family History Family History  Problem Relation Age of Onset  . Diabetes Father   . Kidney disease Father     failure due to diabetes  . Hypertension Mother     Social History Social History  Substance Use Topics  . Smoking status: Former Smoker    Quit date: 05/13/2009  . Smokeless tobacco: Never Used  . Alcohol use 0.6 oz/week    1 Glasses of wine per week     Comment: occaional     Allergies   Penicillins and Shrimp [shellfish allergy]   Review of Systems Review of Systems  HENT: Positive for ear pain.   Respiratory: Positive for cough.      Physical Exam Triage Vital Signs ED Triage Vitals [07/09/16 1136]  Enc Vitals Group     BP 107/78     Pulse Rate 88     Resp 16     Temp 98.1 F (36.7  C)     Temp Source Oral     SpO2 99 %     Weight      Height      Head Circumference      Peak Flow      Pain Score      Pain Loc      Pain Edu?      Excl. in [place]?    No data found.   Updated Vital Signs BP 107/78 (BP Location: Left Arm)   Pulse 88   Temp 98.1 F (36.7 C) (Oral)   Resp 16   LMP 07/07/2016   SpO2 99%   Visual Acuity Right Eye Distance:   Left Eye Distance:   Bilateral Distance:    Right Eye Near:   Left Eye Near:    Bilateral Near:     Physical Exam   UC Treatments / Results  Labs (all labs ordered are listed, but only abnormal results are displayed) Labs Reviewed - No data to display  EKG  EKG Interpretation None       Radiology No results found.  Procedures Procedures (including critical care time)  Medications Ordered in UC Medications - No data to display   Initial Impression / Assessment and Plan / UC Course  I have reviewed the triage vital signs and the  nursing notes.  Pertinent labs & imaging results that were available during my care of the patient were reviewed by me and considered in my medical decision making (see chart for details).       Final Clinical Impressions(s) / UC Diagnoses   Final diagnoses:  Viral URI with cough    New Prescriptions Discharge Medication List as of 07/09/2016 12:35 PM    START taking these medications   Details  guaiFENesin-codeine (ROBITUSSIN AC) 100-10 MG/5ML syrup Take 10 mLs by mouth 4 (four) times daily as needed for cough., Starting Tue 07/09/2016, Normal    ipratropium (ATROVENT) 0.06 % nasal spray Place 2 sprays into both nostrils 4 (four) times daily., Starting Tue 07/09/2016, Normal         Billy Fischer, MD 07/23/16 2201

## 2016-07-09 NOTE — Discharge Instructions (Signed)
Drink plenty of fluids as discussed, use medicine as prescribed, and mucinex or delsym for cough. Return or see your doctor if further problems °

## 2016-08-15 ENCOUNTER — Encounter (HOSPITAL_COMMUNITY): Payer: Self-pay | Admitting: Emergency Medicine

## 2016-08-15 ENCOUNTER — Observation Stay (HOSPITAL_COMMUNITY)
Admission: EM | Admit: 2016-08-15 | Discharge: 2016-08-16 | Disposition: A | Discharge status: Home / Self Care | Payer: Medicaid Other | Attending: Family Medicine | Admitting: Family Medicine

## 2016-08-15 ENCOUNTER — Emergency Department (HOSPITAL_COMMUNITY): Payer: Medicaid Other

## 2016-08-15 DIAGNOSIS — R079 Chest pain, unspecified: Principal | ICD-10-CM

## 2016-08-15 DIAGNOSIS — R000 Tachycardia, unspecified: Secondary | ICD-10-CM | POA: Diagnosis not present

## 2016-08-15 DIAGNOSIS — Z880 Allergy status to penicillin: Secondary | ICD-10-CM | POA: Insufficient documentation

## 2016-08-15 DIAGNOSIS — D649 Anemia, unspecified: Secondary | ICD-10-CM | POA: Diagnosis not present

## 2016-08-15 DIAGNOSIS — Z91013 Allergy to seafood: Secondary | ICD-10-CM | POA: Diagnosis not present

## 2016-08-15 DIAGNOSIS — Z87891 Personal history of nicotine dependence: Secondary | ICD-10-CM | POA: Insufficient documentation

## 2016-08-15 DIAGNOSIS — R0602 Shortness of breath: Secondary | ICD-10-CM | POA: Diagnosis not present

## 2016-08-15 DIAGNOSIS — E876 Hypokalemia: Secondary | ICD-10-CM

## 2016-08-15 DIAGNOSIS — R739 Hyperglycemia, unspecified: Secondary | ICD-10-CM | POA: Insufficient documentation

## 2016-08-15 DIAGNOSIS — F419 Anxiety disorder, unspecified: Secondary | ICD-10-CM | POA: Diagnosis not present

## 2016-08-15 HISTORY — DX: Compression of brain: G93.5

## 2016-08-15 HISTORY — DX: Other disorders of pituitary gland: E23.6

## 2016-08-15 LAB — I-STAT CHEM 8, ED
BUN: 11 mg/dL (ref 6–20)
Calcium, Ion: 1.11 mmol/L — ABNORMAL LOW (ref 1.15–1.40)
Chloride: 106 mmol/L (ref 101–111)
Creatinine, Ser: 1 mg/dL (ref 0.44–1.00)
Glucose, Bld: 129 mg/dL — ABNORMAL HIGH (ref 65–99)
HCT: 37 % (ref 36.0–46.0)
Hemoglobin: 12.6 g/dL (ref 12.0–15.0)
Potassium: 3.3 mmol/L — ABNORMAL LOW (ref 3.5–5.1)
Sodium: 139 mmol/L (ref 135–145)
TCO2: 19 mmol/L (ref 0–100)

## 2016-08-15 LAB — RAPID URINE DRUG SCREEN, HOSP PERFORMED
Amphetamines: POSITIVE — AB
BARBITURATES: NOT DETECTED
Benzodiazepines: POSITIVE — AB
COCAINE: NOT DETECTED
OPIATES: NOT DETECTED
TETRAHYDROCANNABINOL: NOT DETECTED

## 2016-08-15 LAB — I-STAT TROPONIN, ED
TROPONIN I, POC: 0 ng/mL (ref 0.00–0.08)
TROPONIN I, POC: 0 ng/mL (ref 0.00–0.08)

## 2016-08-15 LAB — D-DIMER, QUANTITATIVE (NOT AT ARMC): D DIMER QUANT: 0.79 ug{FEU}/mL — AB (ref 0.00–0.50)

## 2016-08-15 LAB — I-STAT BETA HCG BLOOD, ED (MC, WL, AP ONLY): I-stat hCG, quantitative: <5 m[IU]/mL

## 2016-08-15 LAB — TROPONIN I

## 2016-08-15 MED ORDER — ONDANSETRON HCL 4 MG/2ML IJ SOLN: 4.0000 mg | Freq: Four times a day (QID) | INTRAMUSCULAR | Status: DC | PRN

## 2016-08-15 MED ORDER — POTASSIUM CHLORIDE 10 MEQ/100ML IV SOLN
10.0000 meq | INTRAVENOUS | Status: AC
  Administered 2016-08-15 – 2016-08-16 (×3): 10 meq via INTRAVENOUS
  Filled 2016-08-15 (×3): qty 100

## 2016-08-15 MED ORDER — GI COCKTAIL ~~LOC~~: 30.0000 mL | Freq: Four times a day (QID) | ORAL | Status: DC | PRN

## 2016-08-15 MED ORDER — ASPIRIN EC 325 MG PO TBEC
325.0000 mg | DELAYED_RELEASE_TABLET | Freq: Every day | ORAL | Status: DC
  Administered 2016-08-16: 325 mg via ORAL
  Filled 2016-08-15: qty 1

## 2016-08-15 MED ORDER — FAMOTIDINE 20 MG PO TABS
20.0000 mg | ORAL_TABLET | Freq: Two times a day (BID) | ORAL | Status: DC
  Administered 2016-08-15 – 2016-08-16 (×2): 20 mg via ORAL
  Filled 2016-08-15 (×2): qty 1

## 2016-08-15 MED ORDER — LORAZEPAM 0.5 MG PO TABS: 0.5000 mg | ORAL_TABLET | Freq: Four times a day (QID) | ORAL | Status: DC | PRN

## 2016-08-15 MED ORDER — SODIUM CHLORIDE 0.9 % IV SOLN: 30.0000 meq | Freq: Once | INTRAVENOUS | Status: DC

## 2016-08-15 MED ORDER — SODIUM CHLORIDE 0.9 % IV BOLUS (SEPSIS)
500.0000 mL | Freq: Once | INTRAVENOUS | Status: AC
Start: 2016-08-15 — End: 2016-08-16
  Administered 2016-08-16: 500 mL via INTRAVENOUS

## 2016-08-15 MED ORDER — LORAZEPAM 2 MG/ML IJ SOLN: 0.5000 mg | Freq: Four times a day (QID) | INTRAMUSCULAR | Status: DC | PRN

## 2016-08-15 MED ORDER — KETOROLAC TROMETHAMINE 15 MG/ML IJ SOLN
15.0000 mg | Freq: Once | INTRAMUSCULAR | Status: AC
  Administered 2016-08-15: 15 mg via INTRAVENOUS
  Filled 2016-08-15: qty 1

## 2016-08-15 MED ORDER — ASPIRIN 81 MG PO CHEW
324.0000 mg | CHEWABLE_TABLET | Freq: Once | ORAL | Status: AC
  Administered 2016-08-15: 324 mg via ORAL
  Filled 2016-08-15: qty 4

## 2016-08-15 MED ORDER — LORAZEPAM 2 MG/ML IJ SOLN
2.0000 mg | Freq: Once | INTRAMUSCULAR | Status: AC
  Administered 2016-08-15: 1 mg via INTRAVENOUS
  Filled 2016-08-15: qty 1

## 2016-08-15 MED ORDER — LORAZEPAM 0.5 MG PO TABS
0.5000 mg | ORAL_TABLET | Freq: Once | ORAL | Status: AC
  Administered 2016-08-15: 0.5 mg via ORAL
  Filled 2016-08-15: qty 1

## 2016-08-15 MED ORDER — ATORVASTATIN CALCIUM 40 MG PO TABS: 80.0000 mg | ORAL_TABLET | Freq: Every day | ORAL | Status: DC

## 2016-08-15 MED ORDER — ACETAMINOPHEN 325 MG PO TABS: 650.0000 mg | ORAL_TABLET | ORAL | Status: DC | PRN

## 2016-08-15 MED ORDER — ENOXAPARIN SODIUM 40 MG/0.4ML ~~LOC~~ SOLN
40.0000 mg | Freq: Every day | SUBCUTANEOUS | Status: DC
  Administered 2016-08-15: 40 mg via SUBCUTANEOUS
  Filled 2016-08-15: qty 0.4

## 2016-08-15 NOTE — ED Notes (Signed)
Patient laying calmly in bed. Patient breathing at normal rate and states she feels much better.

## 2016-08-15 NOTE — ED Notes (Signed)
Bed: [id number] Expected date:  Expected time:  Means of arrival:  Comments: EMS 30yo/SOB/Anxiety

## 2016-08-15 NOTE — ED Provider Notes (Signed)
[facility] DEPT Provider Note   CSN: [id number] Arrival date & time: 08/15/16  1535     History   Chief Complaint Chief Complaint  Patient presents with  . Anxiety    HPI   Blood pressure 106/69, pulse 81, temperature 97.8 F (36.6 C), temperature source Oral, resp. rate 16, height 5\' 4"  (1.626 m), weight 79.8 kg, last menstrual period 07/16/2016, SpO2 100 %.  Breanna Miranda is a 31 y.o. female complaining of Shortness of breath, sensation of impending doom and chest tightness worsening over the course of 3 days. She states this is atypical for prior anxiety and panic attacks. She states that she does have a fear of circles and holes. She states that she takes one half of one half of an Ativan normally she has had the same prescription for Ativan for 2 years. She's taken some at home with little relief. She denies his addition is estrogen, DVT/PE, recent mobilizations, calf pain, leg swelling, cough, fever, chills, hemoptysis, family history of early cardiac death, diabetes, hypertension, hyperlipidemia, cocaine or methamphetamine use. She states that she is particularly stressed at work and denies any suicidal ideation, homicidal ideation, auditory or visual hallucinations.  Past Medical History:  Diagnosis Date  . Anemia   . Anxiety   . Asthma   . Blood transfusion without reported diagnosis 2016  . Irregular heart rhythm   . Menorrhagia     Patient Active Problem List   Diagnosis Date Noted  . Chest pain 08/15/2016  . Tachycardia 08/15/2016  . Hypokalemia 08/15/2016  . Hyperglycemia 08/15/2016  . Syncope 08/12/2014  . Menorrhagia 08/10/2014  . Anxiety 08/10/2014  . Anemia 05/06/2012    Past Surgical History:  Procedure Laterality Date  . CHOLECYSTECTOMY    . TUBAL LIGATION      OB History    Gravida Para Term Preterm AB Living   5 4 2 2 1 4    SAB TAB Ectopic Multiple Live Births   1 0 0 0 4       Home Medications    Prior to Admission  medications   Medication Sig Start Date End Date Taking? Authorizing Provider  acetaminophen (TYLENOL) 325 MG tablet Take 650 mg by mouth every 6 (six) hours as needed for mild pain, moderate pain or headache.   Yes Historical Provider, MD  cholecalciferol (VITAMIN D) 1000 units tablet Take 2,000 Units by mouth daily.   Yes Historical Provider, MD  ferrous sulfate 325 (65 FE) MG tablet Take 325 mg by mouth daily with breakfast.   Yes Historical Provider, MD  LORazepam (ATIVAN) 0.5 MG tablet Take 0.5 mg by mouth every 6 (six) hours as needed for anxiety.    Yes Historical Provider, MD  ranitidine (ZANTAC) 150 MG tablet Take 150 mg by mouth daily.   Yes Historical Provider, MD  vitamin C (ASCORBIC ACID) 500 MG tablet Take 500 mg by mouth 3 (three) times daily.   Yes Historical Provider, MD    Family History Family History  Problem Relation Age of Onset  . Diabetes Father   . Kidney disease Father     failure due to diabetes  . Hypertension Mother     Social History Social History  Substance Use Topics  . Smoking status: Former Smoker    Quit date: 05/13/2009  . Smokeless tobacco: Never Used  . Alcohol use 0.6 oz/week    1 Glasses of wine per week     Comment: occaional     Allergies  Penicillins and Shrimp [shellfish allergy]   Review of Systems Review of Systems  10 systems reviewed and found to be negative, except as noted in the HPI.   Physical Exam Updated Vital Signs BP 106/69   Pulse 107   Temp 97.8 F (36.6 C) (Oral)   Resp 17   Ht 5\' 4"  (1.626 m)   Wt 79.8 kg   LMP 07/16/2016   SpO2 100%   BMI 30.21 kg/m   Physical Exam  Constitutional: She is oriented to person, place, and time. She appears well-developed and well-nourished. No distress.  HENT:  Head: Normocephalic and atraumatic.  Mouth/Throat: Oropharynx is clear and moist.  Eyes: Conjunctivae and EOM are normal. Pupils are equal, round, and reactive to light.  Neck: Normal range of motion. No  JVD present. No tracheal deviation present.  Cardiovascular: Normal rate, regular rhythm and intact distal pulses.   Radial pulse equal bilaterally  Pulmonary/Chest: Effort normal and breath sounds normal. No stridor. No respiratory distress. She has no wheezes. She has no rales. She exhibits no tenderness.  Abdominal: Soft. She exhibits no distension and no mass. There is no tenderness. There is no rebound and no guarding.  Musculoskeletal: Normal range of motion. She exhibits no edema or tenderness.  No calf asymmetry, superficial collaterals, palpable cords, edema, Homans sign negative bilaterally.    Neurological: She is alert and oriented to person, place, and time.  Skin: Skin is warm. She is not diaphoretic.  Psychiatric:  Extreme agitation, hypervention  Nursing note and vitals reviewed.    ED Treatments / Results  Labs (all labs ordered are listed, but only abnormal results are displayed) Labs Reviewed  I-STAT CHEM 8, ED - Abnormal; Notable for the following:       Result Value   Potassium 3.3 (*)    Glucose, Bld 129 (*)    Calcium, Ion 1.11 (*)    All other components within normal limits  I-STAT BETA HCG BLOOD, ED ([facility], [facility], [facility] ONLY)  I-STAT TROPOININ, ED  I-STAT TROPOININ, ED    EKG  EKG Interpretation  Date/Time:  Thursday August 15 2016 19:02:20 EST Ventricular Rate:  82 PR Interval:    QRS Duration: 80 QT Interval:  358 QTC Calculation: 419 R Axis:   82 Text Interpretation:  Sinus rhythm nonspecific T waves resolved, possibly tachycardia related Confirmed by GOLDSTON MD, SCOTT [phone]) on 08/15/2016 7:47:21 PM       Radiology Dg Chest Port 1 View  Result Date: 08/15/2016 CLINICAL DATA:  Shortness of breath for 3 days EXAM: PORTABLE CHEST 1 VIEW COMPARISON:  07/09/2016 FINDINGS: Cardiomediastinal silhouette is stable. No infiltrate or pleural effusion. No pulmonary edema. Bony thorax is unremarkable. IMPRESSION: No active disease. Electronically Signed    By: Lahoma Crocker M.D.   On: 08/15/2016 16:46    Procedures Procedures (including critical care time)  Medications Ordered in ED Medications  LORazepam (ATIVAN) injection 2 mg (1 mg Intravenous Given 08/15/16 1622)  ketorolac (TORADOL) 15 MG/ML injection 15 mg (15 mg Intravenous Given 08/15/16 1735)  aspirin chewable tablet 324 mg (324 mg Oral Given 08/15/16 2058)  LORazepam (ATIVAN) tablet 0.5 mg (0.5 mg Oral Given 08/15/16 2058)     Initial Impression / Assessment and Plan / ED Course  I have reviewed the triage vital signs and the nursing notes.  Pertinent labs & imaging results that were available during my care of the patient were reviewed by me and considered in my medical decision making (see chart  for details).     Vitals:   08/15/16 1837 08/15/16 1930 08/15/16 2000 08/15/16 2013  BP: 106/69 105/69 106/69   Pulse: 81 88 107   Resp: 16 17 17    Temp:      TempSrc:      SpO2: 100% 98% 100% 100%  Weight:      Height:        Medications  LORazepam (ATIVAN) injection 2 mg (1 mg Intravenous Given 08/15/16 1622)  ketorolac (TORADOL) 15 MG/ML injection 15 mg (15 mg Intravenous Given 08/15/16 1735)  aspirin chewable tablet 324 mg (324 mg Oral Given 08/15/16 2058)  LORazepam (ATIVAN) tablet 0.5 mg (0.5 mg Oral Given 08/15/16 2058)    Jezebel L Allemang is 31 y.o. female presenting with Panic attack anxiety shortness of breath and chest tightness. She is tachycardic however I think this is likely secondary to her extreme agitation. EKG with T-wave flattening change from prior several years ago. No acute ischemia. Troponin negative, blood work reassuring. Chest x-ray without infiltrate. Patient's symptoms have essentially completely resolved after IV Ativan. Given her EKG changes will check a delta troponin.  Repeat EKG with resolution of T-wave inversions, concern for dynamic changes. Delta troponin negative. On recheck patient states that she feels more short of breath than the  tightness is returning. Given her EKG changes will admit for chest pain rule out. Discussed with Dr. Maudie Mercury who accepts admission.   Final Clinical Impressions(s) / ED Diagnoses   Final diagnoses:  Anxiety  SOB (shortness of breath)    New Prescriptions New Prescriptions   No medications on file     Monico Blitz, PA-C 08/15/16 2106    Sherwood Gambler, MD 08/16/16 [phone]

## 2016-08-15 NOTE — Progress Notes (Signed)
Pt states "I feel like I am going to pass out" while lying down in the bed with the nurse at the bedside. Pt remains alert and oriented, but does have periods where she closes her eyes. No changes in HR or rhythm noted from telemetry monitor. BP 89/60, HR-62, 100%-ra. Dr. Maudie Mercury notified of pt's complaints. Dr. Maudie Mercury also notified of pt's elevated D-dimer and inability to tolerate IV potassium at the full rate of 100cc/hr.Orders received.

## 2016-08-15 NOTE — ED Notes (Signed)
Patient given water and warm blankets per request.

## 2016-08-15 NOTE — H&P (Signed)
TRH H&P   Patient Demographics:    Breanna Miranda, is a 31 y.o. female  MRN: [id number]   DOB - 1986/06/02  Admit Date - 08/15/2016  Outpatient Primary MD for the patient is No PCP Per Patient  Referring MD/NP/PA: Elmyra Ricks Pisciotta  Outpatient Specialists: none  Patient coming from: home  Chief Complaint  Patient presents with  . Anxiety      HPI:    Breanna Miranda  is a 31 y.o. female, w hx of anxiety, Asthma, Chiari1, partial empty sella apparently presents w c/o chest pain,  Starting 3 days ago.  Fairly constant.  sscp "tightness"  , no radiation of the pain, slight dyspnea,  Slight tingling in the feet, + anxiety.  Pt denies fever, chills, cough, n/v, heartburn, diarrhea, brbpr.  Pt took zantac without relief.  Pt presented to ED due to chest pain.   In ED, Ekg =>  ST at 108,nl axis, qtc prlonged, early R progression, slight T flattening in v4-6, which improved on 2nd ekg.  pt tx with ativan and aspirin.  After ativan her chest pain disappeared.  Pt is currently chest pain free.  Trop negative. CXR negative.  Pt will be admitted for cp likely secondary to anxiety.     Review of systems:    In addition to the HPI above,  No Fever-chills, No Headache, No changes with Vision or hearing, No problems swallowing food or Liquids, No  Cough or Shortness of Breath, No Abdominal pain, No Nausea or Vommitting, Bowel movements are regular, No Blood in stool or Urine, No dysuria, No new skin rashes or bruises, No new joints pains-aches,  No new weakness, tingling, numbness in any extremity, No recent weight gain or loss, No polyuria, polydypsia or polyphagia, No significant Mental Stressors. Pt denies pregnancy  A full 10 point Review of Systems was done, except as stated above, all other Review of Systems were negative.   With Past History of the following :     Past Medical History:  Diagnosis Date  . Anemia   . Anxiety   . Asthma   . Blood transfusion without reported diagnosis 2016  . Chiari I malformation ([place])   . Empty sella ([place])   . Irregular heart rhythm   . Menorrhagia       Past Surgical History:  Procedure Laterality Date  . CHOLECYSTECTOMY    . TUBAL LIGATION        Social History:     Social History  Substance Use Topics  . Smoking status: Former Smoker    Packs/day: 0.10    Years: 2.00    Types: Cigarettes    Quit date: 05/13/2009  . Smokeless tobacco: Never Used  . Alcohol use 0.6 oz/week    1 Glasses of wine per week     Comment: occaional     Lives - at home  Mobility - walks by self   Family History :     Family History  Problem Relation Age of Onset  . Diabetes Father   . Kidney disease Father     failure due to diabetes  . Hypertension Mother   . Heart attack Maternal Grandmother 63      Home Medications:   Prior to Admission medications   Medication Sig Start Date End Date Taking? Authorizing Provider  acetaminophen (TYLENOL) 325 MG tablet Take 650 mg by mouth every 6 (six) hours as needed for mild pain, moderate pain or headache.   Yes Historical Provider, MD  cholecalciferol (VITAMIN D) 1000 units tablet Take 2,000 Units by mouth daily.   Yes Historical Provider, MD  ferrous sulfate 325 (65 FE) MG tablet Take 325 mg by mouth daily with breakfast.   Yes Historical Provider, MD  LORazepam (ATIVAN) 0.5 MG tablet Take 0.5 mg by mouth every 6 (six) hours as needed for anxiety.    Yes Historical Provider, MD  ranitidine (ZANTAC) 150 MG tablet Take 150 mg by mouth daily.   Yes Historical Provider, MD  vitamin C (ASCORBIC ACID) 500 MG tablet Take 500 mg by mouth 3 (three) times daily.   Yes Historical Provider, MD     Allergies:     Allergies  Allergen Reactions  . Penicillins Anaphylaxis, Hives and Itching    Has patient had a PCN reaction causing immediate rash,  facial/tongue/throat swelling, SOB or lightheadedness with hypotension: Yes Has patient had a PCN reaction causing severe rash involving mucus membranes or skin necrosis: No Has patient had a PCN reaction that required hospitalization No Has patient had a PCN reaction occurring within the last 10 years: Yes If all of the above answers are "NO", then may proceed with Cephalosporin use.   . Shrimp [Shellfish Allergy] Anaphylaxis, Hives and Itching     Physical Exam:   Vitals  Blood pressure 106/69, pulse 107, temperature 97.8 F (36.6 C), temperature source Oral, resp. rate 17, height 5\' 4"  (1.626 m), weight 79.8 kg (176 lb), last menstrual period 07/16/2016, SpO2 100 %.   1. General  lying in bed in NAD,    2. Normal affect and insight, Not Suicidal or Homicidal, Awake Alert, Oriented X 3.  3. No F.N deficits, ALL C.Nerves Intact, Strength 5/5 all 4 extremities, Sensation intact all 4 extremities, Plantars down going.  4. Ears and Eyes appear Normal, Conjunctivae clear, PERRLA. Moist Oral Mucosa.  5. Supple Neck, No JVD, No cervical lymphadenopathy appriciated, No Carotid Bruits.  6. Symmetrical Chest wall movement, Good air movement bilaterally, CTAB.  7. RRR, No Gallops, Rubs or Murmurs, No Parasternal Heave.  8. Positive Bowel Sounds, Abdomen Soft, No tenderness, No organomegaly appriciated,No rebound -guarding or rigidity.  9.  No Cyanosis, Normal Skin Turgor, No Skin Rash or Bruise.  10. Good muscle tone,  joints appear normal , no effusions, Normal ROM.  11. No Palpable Lymph Nodes in Neck or Axillae  + tatoos   Data Review:    CBC  Recent Labs Lab 08/15/16 1621  HGB 12.6  HCT 37.0   ------------------------------------------------------------------------------------------------------------------  Chemistries   Recent Labs Lab 08/15/16 1621  NA 139  K 3.3*  CL 106  GLUCOSE 129*  BUN 11  CREATININE 1.00    ------------------------------------------------------------------------------------------------------------------ estimated creatinine clearance is 82.5 mL/min (by C-G formula based on SCr of 1 mg/dL). ------------------------------------------------------------------------------------------------------------------ No results for input(s): TSH, T4TOTAL, T3FREE, THYROIDAB in the last 72 hours.  Invalid input(s): FREET3  Coagulation profile No results for input(s): INR, PROTIME in the last 168 hours. ------------------------------------------------------------------------------------------------------------------- No results for input(s): DDIMER in the last 72 hours. -------------------------------------------------------------------------------------------------------------------  Cardiac Enzymes No results for input(s): CKMB, TROPONINI, MYOGLOBIN in the last 168 hours.  Invalid input(s): CK ------------------------------------------------------------------------------------------------------------------ No results found for: BNP   ---------------------------------------------------------------------------------------------------------------  Urinalysis    Component Value Date/Time   COLORURINE YELLOW 04/01/2016 1337   APPEARANCEUR HAZY (A) 04/01/2016 1337   LABSPEC 1.020 04/01/2016 1337   PHURINE 6.5 04/01/2016 1337   GLUCOSEU NEGATIVE 04/01/2016 1337   HGBUR NEGATIVE 04/01/2016 1337   BILIRUBINUR NEGATIVE 04/01/2016 1337   KETONESUR NEGATIVE 04/01/2016 1337   PROTEINUR NEGATIVE 04/01/2016 1337   UROBILINOGEN 1.0 09/15/2014 2245   NITRITE NEGATIVE 04/01/2016 1337   LEUKOCYTESUR SMALL (A) 04/01/2016 1337    ----------------------------------------------------------------------------------------------------------------   Imaging Results:    Dg Chest Port 1 View  Result Date: 08/15/2016 CLINICAL DATA:  Shortness of breath for 3 days EXAM: PORTABLE CHEST 1 VIEW  COMPARISON:  07/09/2016 FINDINGS: Cardiomediastinal silhouette is stable. No infiltrate or pleural effusion. No pulmonary edema. Bony thorax is unremarkable. IMPRESSION: No active disease. Electronically Signed   By: Lahoma Crocker M.D.   On: 08/15/2016 16:46       Assessment & Plan:    Active Problems:   Chest pain   Tachycardia   Hypokalemia   Hyperglycemia    1. Chest pain Tele Trop I q6h x3 Check lipid Check cardiac echo Check nuclear stress test Check UDS Cont aspirin, start lipitor, no B blocker due to borderline low bp Ativan 0.5mg  iv q6h prn  2. Tachycardia Check d dimer If positive then CTA chest r/o PE  3. Hypokalemia Replete Check cmp in am  4. Hyperglycemia Check hga1c  DVT Prophylaxis Lovenox - SCDs   AM Labs Ordered, also please review Full Orders  Family Communication: Admission, patients condition and plan of care including tests being ordered have been discussed with the patient who indicate understanding and agree with the plan and Code Status.  Code Status FULL CODE  Likely DC to  home  Condition GUARDED    Consults called: none  Admission status: observation   Time spent in minutes : 45 minutes   Jani Gravel M.D on 08/15/2016 at 9:19 PM  Between 7am to 7pm - Pager - [phone] . After 7pm go to [email or web address] - password [email or web address]  [facility] - Office  [phone]

## 2016-08-15 NOTE — ED Notes (Signed)
Portable xray at bedside.

## 2016-08-15 NOTE — ED Triage Notes (Signed)
Per EMS, patient complaining of trouble breathing with deep breaths x 3 days Lung sounds clear. 100% on room air. Hx of anxiety. Prescribed ativan prn and has not had any today.

## 2016-08-15 NOTE — ED Notes (Signed)
PA student at bedside.

## 2016-08-16 ENCOUNTER — Observation Stay (HOSPITAL_COMMUNITY): Payer: Medicaid Other

## 2016-08-16 ENCOUNTER — Observation Stay (HOSPITAL_BASED_OUTPATIENT_CLINIC_OR_DEPARTMENT_OTHER): Payer: Medicaid Other

## 2016-08-16 ENCOUNTER — Encounter (HOSPITAL_COMMUNITY): Payer: Self-pay

## 2016-08-16 DIAGNOSIS — R072 Precordial pain: Secondary | ICD-10-CM | POA: Diagnosis not present

## 2016-08-16 DIAGNOSIS — R739 Hyperglycemia, unspecified: Secondary | ICD-10-CM

## 2016-08-16 DIAGNOSIS — R000 Tachycardia, unspecified: Secondary | ICD-10-CM | POA: Diagnosis not present

## 2016-08-16 DIAGNOSIS — R079 Chest pain, unspecified: Secondary | ICD-10-CM | POA: Diagnosis not present

## 2016-08-16 DIAGNOSIS — E876 Hypokalemia: Secondary | ICD-10-CM | POA: Diagnosis not present

## 2016-08-16 LAB — ECHOCARDIOGRAM COMPLETE
Height: 64 in
Weight: 2840 oz

## 2016-08-16 LAB — LIPID PANEL
CHOLESTEROL: 164 mg/dL (ref 0–200)
HDL: 66 mg/dL (ref >40)
LDL CALC: 82 mg/dL (ref 0–99)
TRIGLYCERIDES: 80 mg/dL (ref <150)
Total CHOL/HDL Ratio: 2.5 RATIO
VLDL: 16 mg/dL (ref 0–40)

## 2016-08-16 LAB — TROPONIN I
Troponin I: <0.03 ng/mL (ref <0.03)
Troponin I: <0.03 ng/mL (ref <0.03)

## 2016-08-16 MED ORDER — IOPAMIDOL (ISOVUE-370) INJECTION 76%
100.0000 mL | Freq: Once | INTRAVENOUS | Status: AC | PRN
  Administered 2016-08-16: 100 mL via INTRAVENOUS

## 2016-08-16 MED ORDER — SODIUM CHLORIDE 0.9 % IJ SOLN
INTRAMUSCULAR | Status: AC
  Filled 2016-08-16: qty 50

## 2016-08-16 MED ORDER — IOPAMIDOL (ISOVUE-370) INJECTION 76%
INTRAVENOUS | Status: AC
  Filled 2016-08-16: qty 100

## 2016-08-16 NOTE — Progress Notes (Signed)
  Echocardiogram 2D Echocardiogram has been performed.  Breanna Miranda 08/16/2016, 11:54 AM

## 2016-08-16 NOTE — Discharge Instructions (Signed)
Breanna Miranda,  You were watched overnight in the hospital because of your chest pain. This appears to have improved secondary to anxiety medication. We checked your heart enzymes which were normal. Your EKG showed an initial abnormality that was likely secondary to your fast heart rate. You also got a CT scan of your chest which showed no blood clot. You may have something called esophageal spasms. Either way, please follow-up with your primary care physician (you have stated you have to make an initial visit appointment with the practice). Please return if symptoms return and persist.

## 2016-08-16 NOTE — Progress Notes (Signed)
MD Lonny Prude contacted regarding [facility]'s question about Cardiology Consult. No Cardiology Consult ordered at this time. MD to assess patient before confirming stress test order.

## 2016-08-16 NOTE — Discharge Summary (Signed)
Physician Discharge Summary  Breanna Miranda [id number] DOB: 08-20-1985 DOA: 08/15/2016  PCP: No PCP Per Patient  Admit date: 08/15/2016 Discharge date: 08/16/2016  Admitted From: Home Disposition: Home  Recommendations for Outpatient Follow-up:  1. Follow up with PCP in 1 week (Patient will establish at [place] per her report) 2. Follow up results: Echocardiogram; Hemoglobin A1C   Discharge Condition: Stable CODE STATUS: Full code   Brief/Interim Summary:  Admission HPI written by Jani Gravel, MD    HPI:   Breanna Miranda  is a 31 y.o. female, w hx of anxiety, Asthma, Chiari1, partial empty sella apparently presents w c/o chest pain,  Starting 3 days ago.  Fairly constant.  sscp "tightness"  , no radiation of the pain, slight dyspnea,  Slight tingling in the feet, + anxiety.  Pt denies fever, chills, cough, n/v, heartburn, diarrhea, brbpr.  Pt took zantac without relief.  Pt presented to ED due to chest pain.   In ED, Ekg =>  ST at 108,nl axis, qtc prlonged, early R progression, slight T flattening in v4-6, which improved on 2nd ekg.  pt tx with ativan and aspirin.  After ativan her chest pain disappeared.  Pt is currently chest pain free.  Trop negative. CXR negative.  Pt will be admitted for cp likely secondary to anxiety.     Hospital course:  Chest pain Resolved with ativan. Troponin trended and negative. EKG normal sinus rhythm. It is possible this could be secondary to esophageal etiology. CTA negative for PE.  Tachycardia Resolved. CTA negative for PE.  Hypokalemia Potassium supplementation  Hyperglycemia Hemoglobin A1C obtained and is pending on discharge.  Discharge Diagnoses:  Active Problems:   Chest pain   Tachycardia   Hypokalemia   Hyperglycemia    Discharge Instructions   Allergies as of 08/16/2016      Reactions   Penicillins Anaphylaxis, Hives, Itching   Has patient had a PCN reaction causing immediate rash,  facial/tongue/throat swelling, SOB or lightheadedness with hypotension: Yes Has patient had a PCN reaction causing severe rash involving mucus membranes or skin necrosis: No Has patient had a PCN reaction that required hospitalization No Has patient had a PCN reaction occurring within the last 10 years: Yes If all of the above answers are "NO", then may proceed with Cephalosporin use.   Shrimp [shellfish Allergy] Anaphylaxis, Hives, Itching      Medication List    TAKE these medications   acetaminophen 325 MG tablet Commonly known as:  TYLENOL Take 650 mg by mouth every 6 (six) hours as needed for mild pain, moderate pain or headache.   cholecalciferol 1000 units tablet Commonly known as:  VITAMIN D Take 2,000 Units by mouth daily.   ferrous sulfate 325 (65 FE) MG tablet Take 325 mg by mouth daily with breakfast.   LORazepam 0.5 MG tablet Commonly known as:  ATIVAN Take 0.5 mg by mouth every 6 (six) hours as needed for anxiety.   ranitidine 150 MG tablet Commonly known as:  ZANTAC Take 150 mg by mouth daily.   vitamin C 500 MG tablet Commonly known as:  ASCORBIC ACID Take 500 mg by mouth 3 (three) times daily.      Follow-up Information    [facility]. Schedule an appointment as soon as possible for a visit.   Why:  Hospital follow-up         Allergies  Allergen Reactions  . Penicillins Anaphylaxis, Hives and Itching    Has patient had  a PCN reaction causing immediate rash, facial/tongue/throat swelling, SOB or lightheadedness with hypotension: Yes Has patient had a PCN reaction causing severe rash involving mucus membranes or skin necrosis: No Has patient had a PCN reaction that required hospitalization No Has patient had a PCN reaction occurring within the last 10 years: Yes If all of the above answers are "NO", then may proceed with Cephalosporin use.   . Shrimp [Shellfish Allergy] Anaphylaxis, Hives and Itching     Consultations:  None   Procedures/Studies: Ct Angio Chest Pe W Or Wo Contrast  Result Date: 08/16/2016 CLINICAL DATA:  Acute onset of near-syncope when lying down. Elevated D-dimer. Initial encounter. EXAM: CT ANGIOGRAPHY CHEST WITH CONTRAST TECHNIQUE: Multidetector CT imaging of the chest was performed using the standard protocol during bolus administration of intravenous contrast. Multiplanar CT image reconstructions and MIPs were obtained to evaluate the vascular anatomy. CONTRAST:  100 mL of Isovue 370 IV contrast COMPARISON:  Chest radiograph performed 08/15/2016 FINDINGS: Cardiovascular:  There is no evidence of pulmonary embolus The heart is unremarkable in appearance. The thoracic aorta is unremarkable. The great vessels are within normal limits. No calcific atherosclerotic disease is seen. Mediastinum/Nodes: The mediastinum is unremarkable in appearance. No mediastinal lymphadenopathy is seen. No pericardial effusion is identified. The visualized portions of thyroid gland are unremarkable. No axillary lymphadenopathy is seen. Lungs/Pleura: The lungs are clear bilaterally. No focal consolidation, pleural effusion or pneumothorax is seen. No masses are identified. Upper Abdomen: The visualized portions of the liver and spleen are grossly unremarkable. The patient is status post cholecystectomy, with clips noted at the gallbladder fossa. The visualized portions of the pancreas, adrenal glands and kidneys are within normal limits. Musculoskeletal: No acute osseous abnormalities are identified. The visualized musculature is unremarkable in appearance. Review of the MIP images confirms the above findings. IMPRESSION: 1. No evidence of pulmonary embolus. 2. Lungs clear bilaterally. Electronically Signed   By: Garald Balding M.D.   On: 08/16/2016 01:38   Dg Chest Port 1 View  Result Date: 08/15/2016 CLINICAL DATA:  Shortness of breath for 3 days EXAM: PORTABLE CHEST 1 VIEW COMPARISON:  07/09/2016  FINDINGS: Cardiomediastinal silhouette is stable. No infiltrate or pleural effusion. No pulmonary edema. Bony thorax is unremarkable. IMPRESSION: No active disease. Electronically Signed   By: Lahoma Crocker M.D.   On: 08/15/2016 16:46      Subjective: Patient reports no chest pain or dyspnea.  Discharge Exam: Vitals:   08/16/16 0555 08/16/16 1005  BP: (!) 109/50 106/87  Pulse: 89 94  Resp: 18 18  Temp: 98.9 F (37.2 C) 98.6 F (37 C)   Vitals:   08/16/16 0151 08/16/16 0330 08/16/16 0555 08/16/16 1005  BP: (!) 88/62 (!) 90/57 (!) 109/50 106/87  Pulse: 83 75 89 94  Resp: 18  18 18   Temp: 98.6 F (37 C)  98.9 F (37.2 C) 98.6 F (37 C)  TempSrc: Oral  Oral Oral  SpO2: 100%  100% 100%  Weight:      Height:        General: Pt is alert, awake, not in acute distress Cardiovascular: RRR, S1/S2 +, no rubs, no gallops Respiratory: CTA bilaterally, no wheezing, no rhonchi Abdominal: Soft, NT, ND, bowel sounds + Extremities: no edema, no cyanosis    The results of significant diagnostics from this hospitalization (including imaging, microbiology, ancillary and laboratory) are listed below for reference.     Microbiology: No results found for this or any previous visit (from the past 240 hour(s)).  Labs: BNP (last 3 results) No results for input(s): BNP in the last 8760 hours. Basic Metabolic Panel:  Recent Labs Lab 08/15/16 1621  NA 139  K 3.3*  CL 106  GLUCOSE 129*  BUN 11  CREATININE 1.00   Liver Function Tests: No results for input(s): AST, ALT, ALKPHOS, BILITOT, PROT, ALBUMIN in the last 168 hours. No results for input(s): LIPASE, AMYLASE in the last 168 hours. No results for input(s): AMMONIA in the last 168 hours. CBC:  Recent Labs Lab 08/15/16 1621  HGB 12.6  HCT 37.0   Cardiac Enzymes:  Recent Labs Lab 08/15/16 2236 08/16/16 0132 08/16/16 0452  TROPONINI <0.03 <0.03 <0.03   BNP: Invalid input(s): POCBNP CBG: No results for input(s):  GLUCAP in the last 168 hours. D-Dimer  Recent Labs  08/15/16 2236  DDIMER 0.79*   Hgb A1c No results for input(s): HGBA1C in the last 72 hours. Lipid Profile  Recent Labs  08/16/16 0452  CHOL 164  HDL 66  LDLCALC 82  TRIG 80  CHOLHDL 2.5   Thyroid function studies No results for input(s): TSH, T4TOTAL, T3FREE, THYROIDAB in the last 72 hours.  Invalid input(s): FREET3 Anemia work up No results for input(s): VITAMINB12, FOLATE, FERRITIN, TIBC, IRON, RETICCTPCT in the last 72 hours. Urinalysis    Component Value Date/Time   COLORURINE YELLOW 04/01/2016 1337   APPEARANCEUR HAZY (A) 04/01/2016 1337   LABSPEC 1.020 04/01/2016 1337   PHURINE 6.5 04/01/2016 1337   GLUCOSEU NEGATIVE 04/01/2016 1337   HGBUR NEGATIVE 04/01/2016 1337   BILIRUBINUR NEGATIVE 04/01/2016 1337   KETONESUR NEGATIVE 04/01/2016 1337   PROTEINUR NEGATIVE 04/01/2016 1337   UROBILINOGEN 1.0 09/15/2014 2245   NITRITE NEGATIVE 04/01/2016 1337   LEUKOCYTESUR SMALL (A) 04/01/2016 1337   Sepsis Labs Invalid input(s): PROCALCITONIN,  WBC,  LACTICIDVEN Microbiology No results found for this or any previous visit (from the past 240 hour(s)).   SIGNED:   Cordelia Poche, MD [facility] 08/16/2016, 12:31 PM Pager [phone]  If 7PM-7AM, please contact night-coverage [email or web address] Password [email or web address]

## 2016-08-17 LAB — HEMOGLOBIN A1C
Hgb A1c MFr Bld: 5.3 % (ref 4.8–5.6)
Mean Plasma Glucose: 105 mg/dL

## 2016-09-11 ENCOUNTER — Ambulatory Visit (HOSPITAL_COMMUNITY)
Admission: EM | Admit: 2016-09-11 | Discharge: 2016-09-11 | Disposition: A | Discharge status: Home / Self Care | Payer: Medicaid Other | Attending: Emergency Medicine | Admitting: Emergency Medicine

## 2016-09-11 ENCOUNTER — Encounter (HOSPITAL_COMMUNITY): Payer: Self-pay | Admitting: Emergency Medicine

## 2016-09-11 DIAGNOSIS — J069 Acute upper respiratory infection, unspecified: Secondary | ICD-10-CM

## 2016-09-11 DIAGNOSIS — B9789 Other viral agents as the cause of diseases classified elsewhere: Secondary | ICD-10-CM | POA: Diagnosis not present

## 2016-09-11 MED ORDER — BENZONATATE 100 MG PO CAPS: 100.0000 mg | ORAL_CAPSULE | Freq: Three times a day (TID) | ORAL | 0 refills | Status: DC

## 2016-09-11 NOTE — ED Triage Notes (Signed)
The patient presented to the [facility] with a complaint of a cough and general body aches x 1 week.

## 2016-09-11 NOTE — Discharge Instructions (Signed)
You most likely have a viral URI, I advise rest, plenty of fluids and management of symptoms with over the counter medicines. For symptoms you may take Tylenol as needed every 4-6 hours for body aches or fever, not to exceed 4,000 mg a day, Take mucinex or mucinex DM ever 12 hours with a full glass of water, you may use an inhaled steroid such as Flonase, 2 sprays each nostril once a day for congestion, or an antihistamine such as Claritin or Zyrtec once a day. For cough, I have prescribed a medication called Tessalon. Take 1 tablet every 8 hours as needed for your cough. Should your symptoms worsen or fail to resolve, follow up with your primary care provider or return to clinic.

## 2016-09-11 NOTE — ED Provider Notes (Signed)
CSN: [id number]     Arrival date & time 09/11/16  1007 History   First MD Initiated Contact with Patient 09/11/16 1025     Chief Complaint  Patient presents with  . Cough   (Consider location/radiation/quality/duration/timing/severity/associated sxs/prior Treatment) 31 year old female presents with 1 week history of URI symptoms   The history is provided by the patient.  Cough  Cough characteristics:  Productive and hacking Sputum characteristics:  Clear and yellow Severity:  Moderate Onset quality:  Gradual Duration:  1 week Timing:  Intermittent Progression:  Waxing and waning Chronicity:  New Smoker: no   Context: sick contacts and upper respiratory infection   Relieved by:  Cough suppressants, decongestant and rest Worsened by:  Nothing Associated symptoms: chills, fever, headaches, rhinorrhea and sinus congestion   Associated symptoms: no chest pain, no diaphoresis, no ear fullness, no ear pain, no eye discharge, no myalgias, no rash, no shortness of breath, no sore throat, no weight loss and no wheezing   Fever:    Duration:  1 week   Timing:  Intermittent   Max temp PTA:  Unable to specify   Temp source:  Subjective   Progression:  Waxing and waning Rhinorrhea:    Quality:  Clear   Severity:  Moderate   Duration:  1 week   Timing:  Constant   Progression:  Unchanged   Past Medical History:  Diagnosis Date  . Anemia   . Anxiety   . Asthma   . Blood transfusion without reported diagnosis 2016  . Chiari I malformation ([place])   . Empty sella ([place])   . Irregular heart rhythm   . Menorrhagia    Past Surgical History:  Procedure Laterality Date  . CHOLECYSTECTOMY    . TUBAL LIGATION     Family History  Problem Relation Age of Onset  . Diabetes Father   . Kidney disease Father     failure due to diabetes  . Hypertension Mother   . Heart attack Maternal Grandmother 63   Social History  Substance Use Topics  . Smoking status: Former Smoker    Packs/day:  0.10    Years: 2.00    Types: Cigarettes    Quit date: 05/13/2009  . Smokeless tobacco: Never Used  . Alcohol use 0.6 oz/week    1 Glasses of wine per week     Comment: occaional   OB History    Gravida Para Term Preterm AB Living   5 4 2 2 1 4    SAB TAB Ectopic Multiple Live Births   1 0 0 0 4     Review of Systems  Constitutional: Positive for chills and fever. Negative for diaphoresis, fatigue and weight loss.  HENT: Positive for congestion and rhinorrhea. Negative for ear discharge, ear pain, sinus pain, sinus pressure and sore throat.   Eyes: Negative.  Negative for photophobia, discharge and itching.  Respiratory: Positive for cough. Negative for choking, shortness of breath and wheezing.   Cardiovascular: Negative.  Negative for chest pain and palpitations.  Gastrointestinal: Negative for abdominal pain, diarrhea, nausea and vomiting.  Genitourinary: Negative for dysuria, flank pain, frequency and urgency.  Musculoskeletal: Negative.  Negative for arthralgias, back pain, myalgias, neck pain and neck stiffness.  Skin: Negative for color change and rash.  Neurological: Positive for headaches. Negative for dizziness, syncope, weakness and light-headedness.  All other systems reviewed and are negative.   Allergies  Penicillins and Shrimp [shellfish allergy]  Home Medications   Prior to  Admission medications   Medication Sig Start Date End Date Taking? Authorizing Provider  ranitidine (ZANTAC) 150 MG tablet Take 150 mg by mouth daily.   Yes Historical Provider, MD  benzonatate (TESSALON) 100 MG capsule Take 1 capsule (100 mg total) by mouth every 8 (eight) hours. 09/11/16   Barnet Glasgow, NP   Meds Ordered and Administered this Visit  Medications - No data to display  BP 120/78 (BP Location: Right Arm)   Pulse 86   Temp 99.4 F (37.4 C) (Oral)   Resp 18   SpO2 100%  No data found.   Physical Exam  Constitutional: She is oriented to person, place, and time. She  appears well-developed and well-nourished. She does not have a sickly appearance. She does not appear ill. No distress.  HENT:  Head: Normocephalic and atraumatic.  Right Ear: Tympanic membrane and external ear normal.  Left Ear: Tympanic membrane and external ear normal.  Nose: Nose normal. Right sinus exhibits no maxillary sinus tenderness and no frontal sinus tenderness. Left sinus exhibits no maxillary sinus tenderness and no frontal sinus tenderness.  Mouth/Throat: Uvula is midline and oropharynx is clear and moist. No oropharyngeal exudate.  Eyes: Pupils are equal, round, and reactive to light.  Neck: Normal range of motion. Neck supple. No JVD present.  Cardiovascular: Normal rate and regular rhythm.   Pulmonary/Chest: Effort normal and breath sounds normal. No respiratory distress. She has no wheezes.  Abdominal: Soft. Bowel sounds are normal. She exhibits no distension. There is no tenderness. There is no guarding.  Musculoskeletal: She exhibits no edema.  Lymphadenopathy:       Head (right side): No submental, no submandibular, no tonsillar and no preauricular adenopathy present.       Head (left side): No submental, no submandibular, no tonsillar and no preauricular adenopathy present.    She has no cervical adenopathy.  Neurological: She is alert and oriented to person, place, and time.  Skin: Skin is warm and dry. Capillary refill takes less than 2 seconds. She is not diaphoretic.  Psychiatric: She has a normal mood and affect. Her behavior is normal.  Nursing note and vitals reviewed.   Urgent Care Course     Procedures (including critical care time)  Labs Review Labs Reviewed - No data to display  Imaging Review No results found.   MDM   1. Viral URI with cough     Provided prescription for Tessalon for cough. Provided counseling and follow up guidelines, along with counseling on OTC therapies for symptom relief. Follow up with PCP if symptoms persist past 1  week.     Barnet Glasgow, NP 09/11/16 1048

## 2017-04-26 IMAGING — CT CT ANGIO CHEST
1 of 6 series · 19 of 36 positions shown · IV contrast (ISOVUE 370)
Comparison: Chest radiograph performed 08/15/2016

CLINICAL DATA: Acute onset of near-syncope when lying down.
Elevated D-dimer. Initial encounter.

EXAM:
CT ANGIOGRAPHY CHEST WITH CONTRAST
TECHNIQUE: Multidetector CT imaging of the chest was performed using the
standard protocol during bolus administration of intravenous
contrast. Multiplanar CT image reconstructions and MIPs were
obtained to evaluate the vascular anatomy.
CONTRAST:  100 mL of Isovue 370 IV contrast

[Series 6: thins · axial · 0.64mm/px · z∈[+246,+462]mm · 19 of 241 slices shown]
[im 13/241  lung]
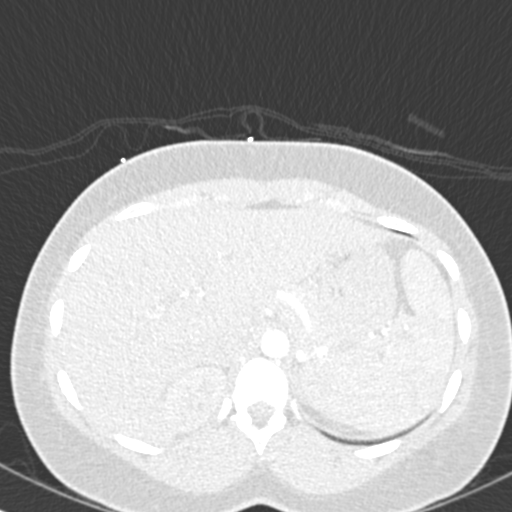
[im 25/241  mediastinal]
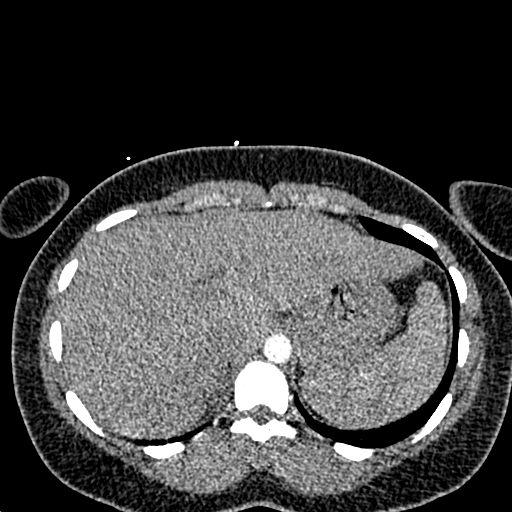
[im 37/241  lung]
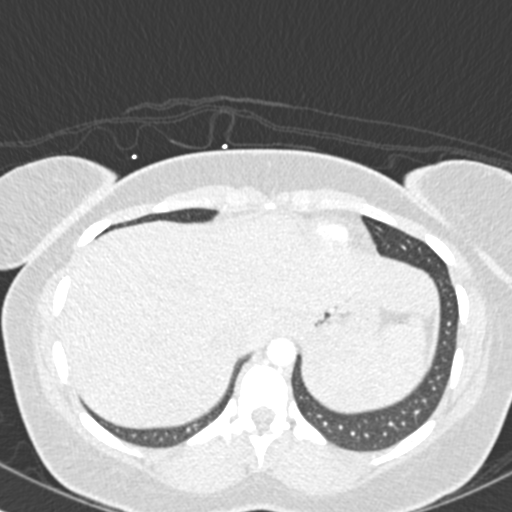
[im 49/241  mediastinal]
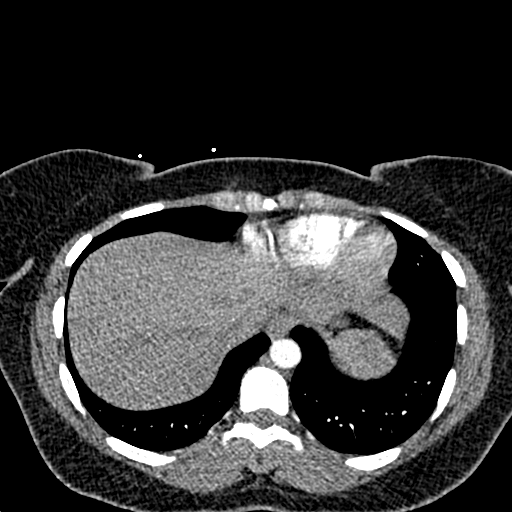
[im 61/241  lung]
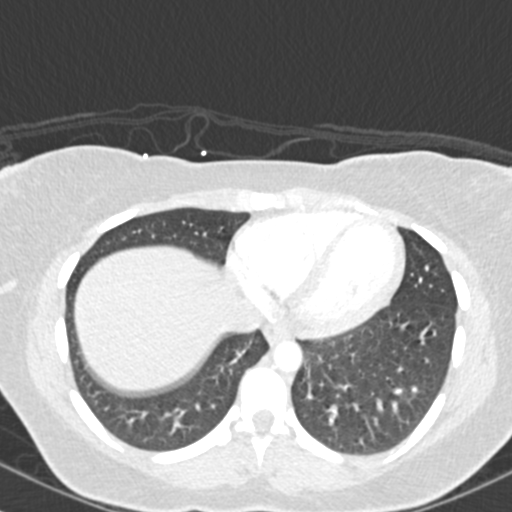
[im 73/241  mediastinal]
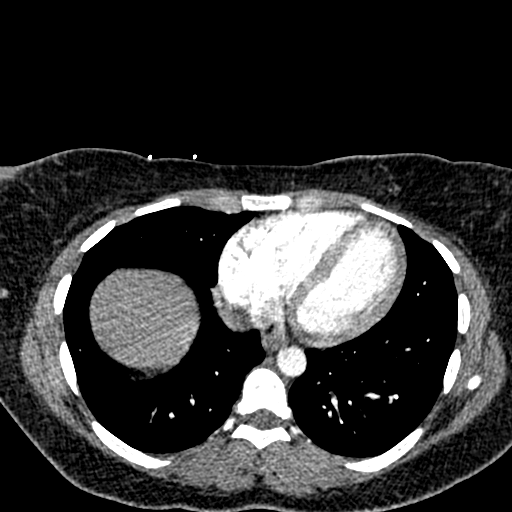
[im 85/241  lung]
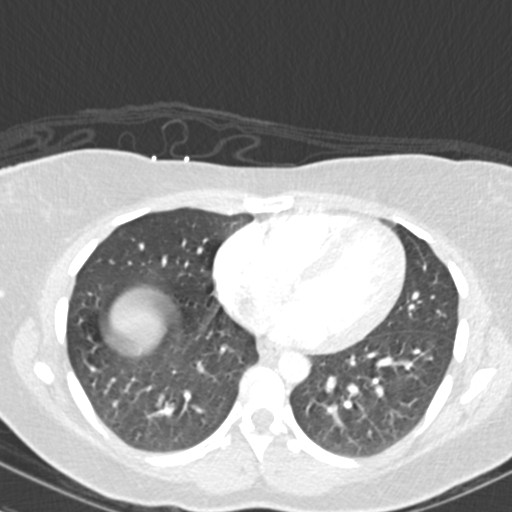
[im 97/241  mediastinal]
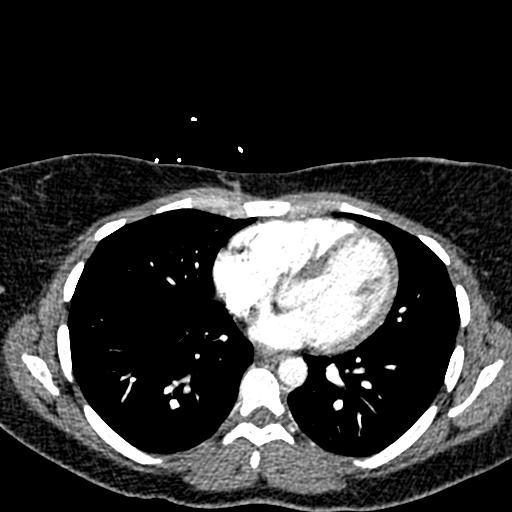
[im 109/241  lung]
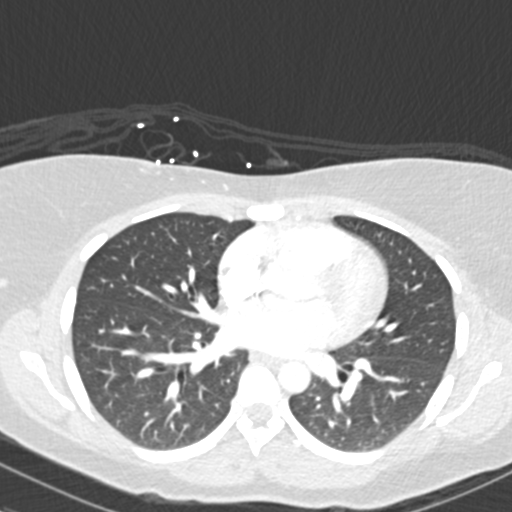
[im 121/241  mediastinal]
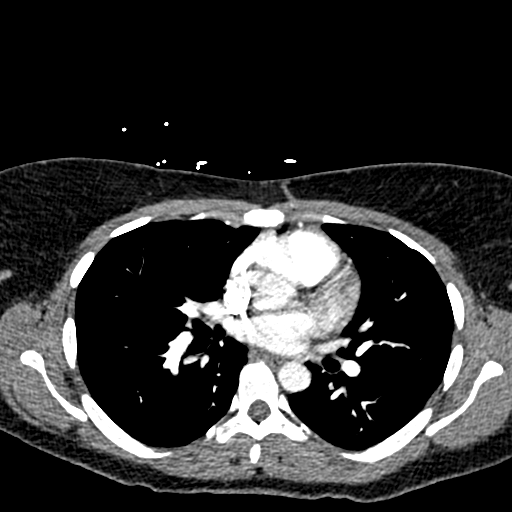
[im 133/241  lung]
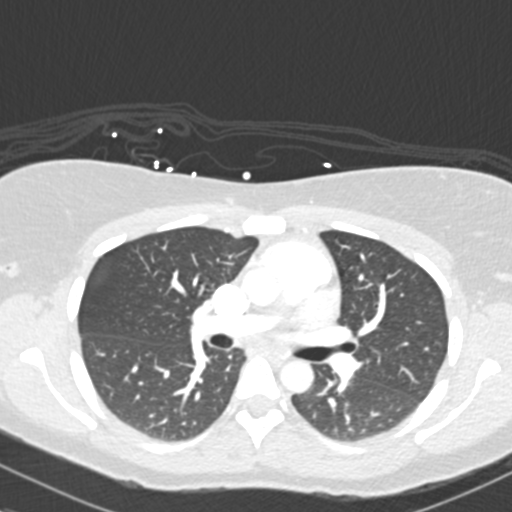
[im 145/241  mediastinal]
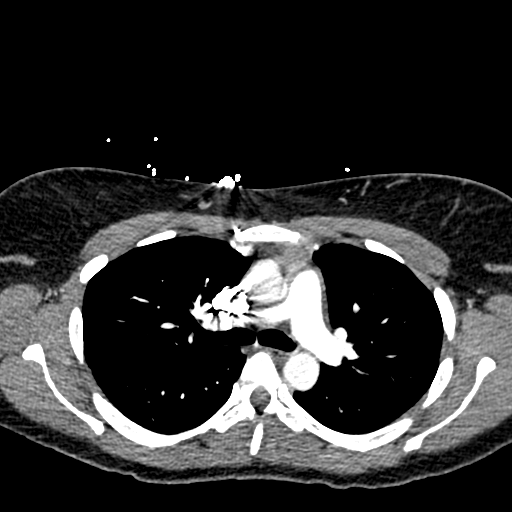
[im 157/241  lung]
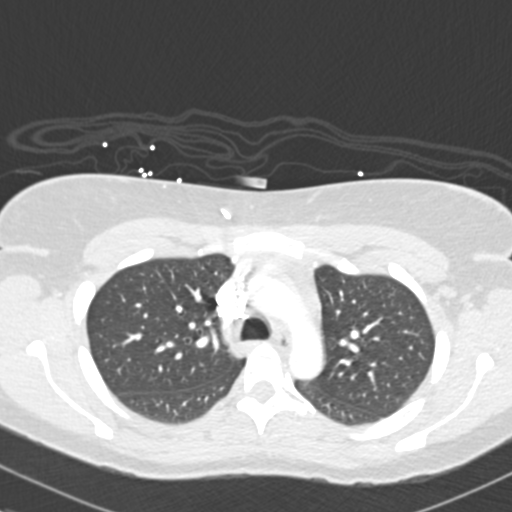
[im 169/241  mediastinal]
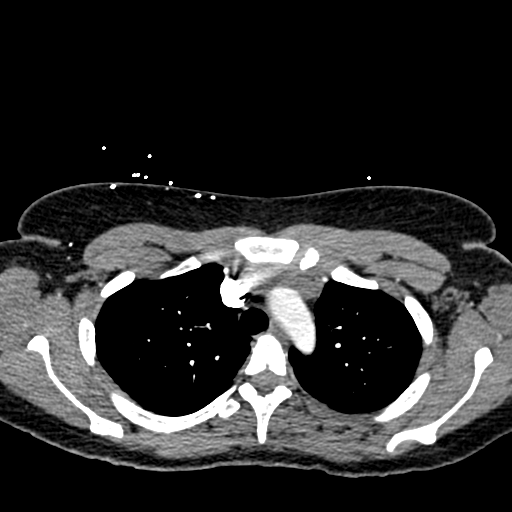
[im 181/241  lung]
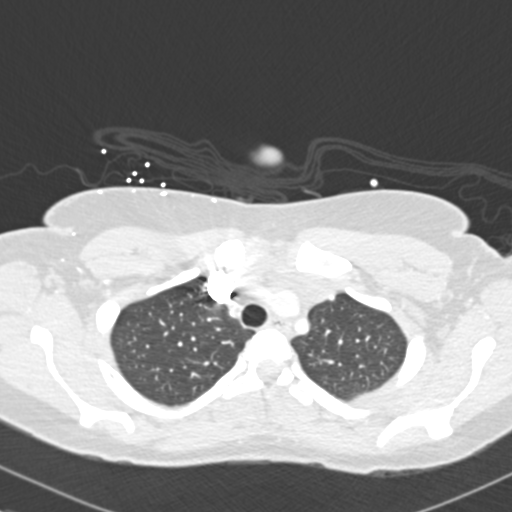
[im 193/241  mediastinal]
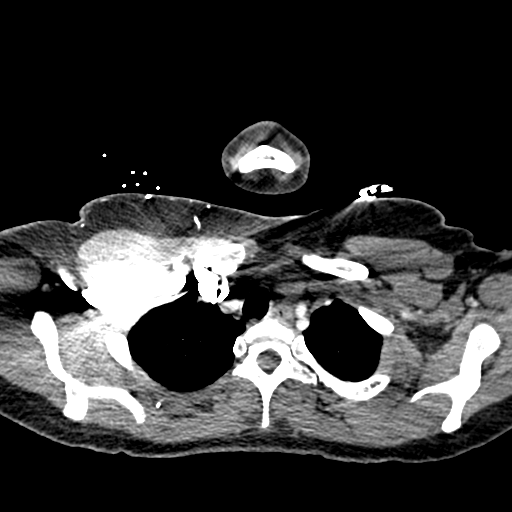
[im 205/241  lung]
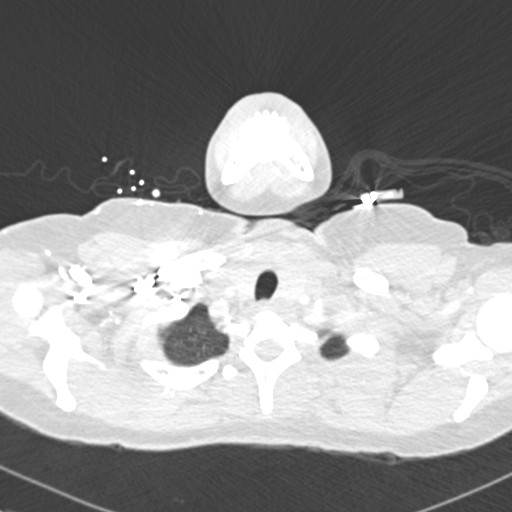
[im 217/241  mediastinal]
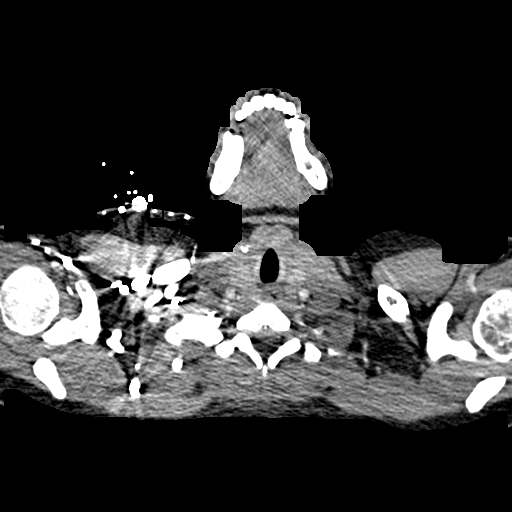
[im 229/241  lung]
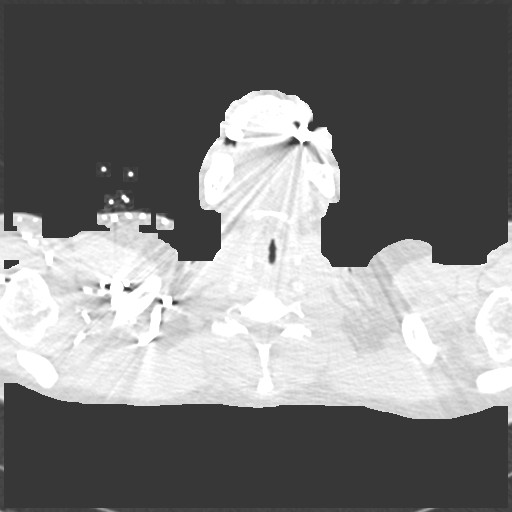

[19 of 36 positions shown; findings below may reference images not displayed]

FINDINGS: Cardiovascular:  There is no evidence of pulmonary embolus

The heart is unremarkable in appearance. The thoracic aorta is
unremarkable. The great vessels are within normal limits. No
calcific atherosclerotic disease is seen.

Mediastinum/Nodes: The mediastinum is unremarkable in appearance. No
mediastinal lymphadenopathy is seen. No pericardial effusion is
identified. The visualized portions of thyroid gland are
unremarkable. No axillary lymphadenopathy is seen.

Lungs/Pleura: The lungs are clear bilaterally. No focal
consolidation, pleural effusion or pneumothorax is seen. No masses
are identified.

Upper Abdomen: The visualized portions of the liver and spleen are
grossly unremarkable. The patient is status post cholecystectomy,
with clips noted at the gallbladder fossa. The visualized portions
of the pancreas, adrenal glands and kidneys are within normal
limits.

Musculoskeletal: No acute osseous abnormalities are identified. The
visualized musculature is unremarkable in appearance.

Review of the MIP images confirms the above findings.
IMPRESSION: 1. No evidence of pulmonary embolus.
2. Lungs clear bilaterally.

## 2017-09-21 ENCOUNTER — Observation Stay (HOSPITAL_COMMUNITY): Payer: Medicaid Other

## 2017-09-21 ENCOUNTER — Observation Stay (HOSPITAL_COMMUNITY)
Admission: EM | Admit: 2017-09-21 | Discharge: 2017-09-22 | Disposition: A | Discharge status: Home / Self Care | Payer: Medicaid Other | Attending: Family Medicine | Admitting: Family Medicine

## 2017-09-21 ENCOUNTER — Other Ambulatory Visit: Payer: Self-pay

## 2017-09-21 ENCOUNTER — Encounter (HOSPITAL_COMMUNITY): Payer: Self-pay | Admitting: Emergency Medicine

## 2017-09-21 DIAGNOSIS — D259 Leiomyoma of uterus, unspecified: Secondary | ICD-10-CM | POA: Insufficient documentation

## 2017-09-21 DIAGNOSIS — Z880 Allergy status to penicillin: Secondary | ICD-10-CM | POA: Insufficient documentation

## 2017-09-21 DIAGNOSIS — F5089 Other specified eating disorder: Secondary | ICD-10-CM

## 2017-09-21 DIAGNOSIS — D649 Anemia, unspecified: Secondary | ICD-10-CM | POA: Diagnosis not present

## 2017-09-21 DIAGNOSIS — R1013 Epigastric pain: Secondary | ICD-10-CM | POA: Diagnosis not present

## 2017-09-21 DIAGNOSIS — R9389 Abnormal findings on diagnostic imaging of other specified body structures: Secondary | ICD-10-CM | POA: Diagnosis not present

## 2017-09-21 DIAGNOSIS — R1012 Left upper quadrant pain: Secondary | ICD-10-CM | POA: Insufficient documentation

## 2017-09-21 DIAGNOSIS — R109 Unspecified abdominal pain: Secondary | ICD-10-CM

## 2017-09-21 DIAGNOSIS — Z9049 Acquired absence of other specified parts of digestive tract: Secondary | ICD-10-CM | POA: Diagnosis not present

## 2017-09-21 DIAGNOSIS — Z9851 Tubal ligation status: Secondary | ICD-10-CM | POA: Diagnosis not present

## 2017-09-21 DIAGNOSIS — R202 Paresthesia of skin: Secondary | ICD-10-CM | POA: Diagnosis not present

## 2017-09-21 DIAGNOSIS — J45909 Unspecified asthma, uncomplicated: Secondary | ICD-10-CM | POA: Insufficient documentation

## 2017-09-21 DIAGNOSIS — Z87891 Personal history of nicotine dependence: Secondary | ICD-10-CM | POA: Diagnosis not present

## 2017-09-21 DIAGNOSIS — K5900 Constipation, unspecified: Secondary | ICD-10-CM | POA: Insufficient documentation

## 2017-09-21 DIAGNOSIS — Z87892 Personal history of anaphylaxis: Secondary | ICD-10-CM | POA: Insufficient documentation

## 2017-09-21 DIAGNOSIS — Z6830 Body mass index (BMI) 30.0-30.9, adult: Secondary | ICD-10-CM | POA: Insufficient documentation

## 2017-09-21 DIAGNOSIS — F5083 Pica in adults: Secondary | ICD-10-CM

## 2017-09-21 DIAGNOSIS — N920 Excessive and frequent menstruation with regular cycle: Secondary | ICD-10-CM | POA: Insufficient documentation

## 2017-09-21 DIAGNOSIS — R51 Headache: Secondary | ICD-10-CM | POA: Diagnosis not present

## 2017-09-21 DIAGNOSIS — D509 Iron deficiency anemia, unspecified: Secondary | ICD-10-CM | POA: Diagnosis not present

## 2017-09-21 DIAGNOSIS — F419 Anxiety disorder, unspecified: Secondary | ICD-10-CM | POA: Diagnosis not present

## 2017-09-21 DIAGNOSIS — R200 Anesthesia of skin: Secondary | ICD-10-CM | POA: Diagnosis not present

## 2017-09-21 LAB — COMPREHENSIVE METABOLIC PANEL
ALBUMIN: 4.1 g/dL (ref 3.5–5.0)
ALK PHOS: 74 U/L (ref 38–126)
ALT: 16 U/L (ref 14–54)
AST: 23 U/L (ref 15–41)
Anion gap: 10 (ref 5–15)
BUN: 9 mg/dL (ref 6–20)
CALCIUM: 9.5 mg/dL (ref 8.9–10.3)
CHLORIDE: 102 mmol/L (ref 101–111)
CO2: 22 mmol/L (ref 22–32)
Creatinine, Ser: 0.73 mg/dL (ref 0.44–1.00)
GFR calc Af Amer: >60 mL/min (ref >60)
GFR calc non Af Amer: >60 mL/min (ref >60)
GLUCOSE: 96 mg/dL (ref 65–99)
Potassium: 3.5 mmol/L (ref 3.5–5.1)
SODIUM: 134 mmol/L — AB (ref 135–145)
Total Bilirubin: 0.5 mg/dL (ref 0.3–1.2)
Total Protein: 8.2 g/dL — ABNORMAL HIGH (ref 6.5–8.1)

## 2017-09-21 LAB — IRON AND TIBC
Iron: 12 ug/dL — ABNORMAL LOW (ref 28–170)
Saturation Ratios: 2 % — ABNORMAL LOW (ref 10.4–31.8)
TIBC: 536 ug/dL — ABNORMAL HIGH (ref 250–450)
UIBC: 524 ug/dL

## 2017-09-21 LAB — POC OCCULT BLOOD, ED: Fecal Occult Bld: NEGATIVE

## 2017-09-21 LAB — CBC
HCT: 28.4 % — ABNORMAL LOW (ref 36.0–46.0)
HEMOGLOBIN: 7.8 g/dL — AB (ref 12.0–15.0)
MCH: 15.5 pg — ABNORMAL LOW (ref 26.0–34.0)
MCHC: 27.5 g/dL — AB (ref 30.0–36.0)
MCV: 56.5 fL — ABNORMAL LOW (ref 78.0–100.0)
PLATELETS: 364 10*3/uL (ref 150–400)
RBC: 5.03 MIL/uL (ref 3.87–5.11)
RDW: 19.3 % — ABNORMAL HIGH (ref 11.5–15.5)
WBC: 4.5 10*3/uL (ref 4.0–10.5)

## 2017-09-21 LAB — PREPARE RBC (CROSSMATCH)

## 2017-09-21 MED ORDER — FAMOTIDINE 20 MG PO TABS
20.0000 mg | ORAL_TABLET | Freq: Once | ORAL | Status: AC
  Administered 2017-09-21: 20 mg via ORAL
  Filled 2017-09-21: qty 1

## 2017-09-21 MED ORDER — ACETAMINOPHEN 650 MG RE SUPP: 650.0000 mg | Freq: Four times a day (QID) | RECTAL | Status: DC | PRN

## 2017-09-21 MED ORDER — SODIUM CHLORIDE 0.9 % IV SOLN
Freq: Once | INTRAVENOUS | Status: AC
  Administered 2017-09-21: 20:00:00 via INTRAVENOUS

## 2017-09-21 MED ORDER — ACETAMINOPHEN 500 MG PO TABS
1000.0000 mg | ORAL_TABLET | Freq: Once | ORAL | Status: AC
  Administered 2017-09-21: 1000 mg via ORAL
  Filled 2017-09-21: qty 2

## 2017-09-21 MED ORDER — ACETAMINOPHEN 325 MG PO TABS
650.0000 mg | ORAL_TABLET | Freq: Four times a day (QID) | ORAL | Status: DC | PRN
  Administered 2017-09-22: 650 mg via ORAL
  Filled 2017-09-21: qty 2

## 2017-09-21 MED ORDER — GI COCKTAIL ~~LOC~~
30.0000 mL | Freq: Three times a day (TID) | ORAL | Status: DC | PRN
  Administered 2017-09-21: 30 mL via ORAL
  Filled 2017-09-21: qty 30

## 2017-09-21 NOTE — ED Triage Notes (Signed)
Pt to ED c/o possibly swallowing cement. Patient reports she has pica and normally craves cornstarch and cement, which she usually "just holds in her mouth" and she feels like she may have swallowed some because her mouth and throat are burning. Patient airway intact, talking in clear, full sentences, no apparent distress noted.

## 2017-09-21 NOTE — H&P (Addendum)
[facility] Admission History and Physical Service Pager: [phone]  Patient name: Breanna Miranda Medical record number: [id number] Date of birth: 26-Aug-1985 Age: 32 y.o. Gender: female  Primary Care Provider: Patient, No Pcp Per Consultants: None Code Status: Full  Chief Complaint: Eating rocks  Assessment and Plan: [name] is a 32 y.o. female presenting with symptomatic anemia. PMH is significant for menorrhagia, anxiety.  Symptomatic anemia: hx of iron deficiency anemia 5 years ago, with pica, s/p 1 U PRBC back then. Then well x 3 years, now with pica again.  She reports she has had heavy periods since the onset of her menses in her early teenage years.  Is unsure if sisters or mother have menorrhagia.  Tried birth control pills in the past for menorrhagia, states this made her bleeding worse.  Is amenable to transfusion today.  Denies history of gynecological workup including ultrasound.  History of 4 vaginal births, one SAB, postpartum tubal ligation with her last child.  Endorses shortness of breath, headache, tiredness, frequently cold.  No recent illness, fever, sick contacts.  LMP about 1 month ago, due for menses next week she states.  Denies melena, GI source of bleeding less likely.  Given patient's symptoms, which have been going on for several weeks less likely acute source of bleeding. -Admit to [place], attending Dr. Erin Hearing -Transfuse 1 unit packed red blood cells now - Complete pelvic and transvaginal ultrasound - Posttransfusion CBC -Consider hormonal IUD for menorrhagia, introduced this idea to patient -check TSH 2/2 feeling cold and tachycardia  Epigastric pain: Possibly due to eating rocks.  KUB ordered per poison control. -Follow-up KUB -GI cocktail as needed  Anxiety: Patient states she is normally able to breathe through her anxiety, however would like to consider medication treatment for this.  States she took Ativan  in the past and this helped her.  Offered SSRI, patient will think about this. -Consider SSRI in for his outpatient  No PCP: Patient states she would like to follow-up with our clinic. -Schedule outpatient follow-up  FEN/GI: Regular diet, GI cocktail as needed Prophylaxis: Patient requests SCDs rather than Lovenox  Disposition: Admit to MedSurg  History of Present Illness:  Breanna Miranda is a 32 y.o. female presenting with eating rocks for several weeks.  History notable for symptomatic anemia secondary to menorrhagia in the past, has never established with a PCP nor followed up for menorrhagia.  States she had pica with her previous symptomatic anemia, was transfused 1 unit and felt well for several years after this.  Reports a history of menorrhagia since the beginning of her menses.  Cannot count the number of pads she uses in 1 day during her period, which is 6 days long.  Denies any other source of bleeding including hemoptysis, GI bleed.  Reports that in the past she was given OCPs to try to treat her menorrhagia, states this made her bleeding worse.  Endorses 4 pregnancies to term, vaginal births, 1 SAB after her third pregnancy.  Reports history of postpartum tubal ligation after her fourth baby.  Would like to have a PCP.  Occasional alcohol use, denies history of illicit drugs.  Review Of Systems: Per HPI with the following additions: As below  Review of Systems  Constitutional: Negative for chills, fever and weight loss.  Respiratory: Positive for shortness of breath. Negative for cough, hemoptysis and wheezing.   Cardiovascular: Negative for chest pain and palpitations.  Gastrointestinal: Positive for heartburn. Negative for abdominal  pain, blood in stool and melena.  Neurological: Positive for headaches.  Psychiatric/Behavioral: The patient is nervous/anxious.     Patient Active Problem List   Diagnosis Date Noted  . Chest pain 08/15/2016  . Tachycardia 08/15/2016  .  Hypokalemia 08/15/2016  . Hyperglycemia 08/15/2016  . Syncope 08/12/2014  . Menorrhagia 08/10/2014  . Anxiety 08/10/2014  . Anemia 05/06/2012    Past Medical History: Past Medical History:  Diagnosis Date  . Anemia   . Anxiety   . Asthma   . Blood transfusion without reported diagnosis 2016  . Chiari I malformation ([place])   . Empty sella ([place])   . Irregular heart rhythm   . Menorrhagia     Past Surgical History: Past Surgical History:  Procedure Laterality Date  . CHOLECYSTECTOMY    . TUBAL LIGATION      Social History: Social History   Tobacco Use  . Smoking status: Former Smoker    Packs/day: 0.10    Years: 2.00    Pack years: 0.20    Types: Cigarettes    Last attempt to quit: 05/13/2009    Years since quitting: 8.3  . Smokeless tobacco: Never Used  Substance Use Topics  . Alcohol use: Yes    Alcohol/week: 0.6 oz    Types: 1 Glasses of wine per week    Comment: occaional  . Drug use: No   Additional social history: Has 4 children, partner in room but not spouse. states mom would be her medical decision maker if necessary Please also refer to relevant sections of EMR.  Family History: Family History  Problem Relation Age of Onset  . Diabetes Father   . Kidney disease Father        failure due to diabetes  . Hypertension Mother   . Heart attack Maternal Grandmother 63    Allergies and Medications: Allergies  Allergen Reactions  . Penicillins Anaphylaxis, Hives and Itching    Has patient had a PCN reaction causing immediate rash, facial/tongue/throat swelling, SOB or lightheadedness with hypotension: Yes Has patient had a PCN reaction causing severe rash involving mucus membranes or skin necrosis: No Has patient had a PCN reaction that required hospitalization No Has patient had a PCN reaction occurring within the last 10 years: Yes If all of the above answers are "NO", then may proceed with Cephalosporin use.   . Shrimp [Shellfish Allergy]  Anaphylaxis, Hives and Itching   No current facility-administered medications on file prior to encounter.    Current Outpatient Medications on File Prior to Encounter  Medication Sig Dispense Refill  . acetaminophen (TYLENOL) 500 MG tablet Take 1,000 mg by mouth every 6 (six) hours as needed.    . ranitidine (ZANTAC) 150 MG tablet Take 150 mg by mouth daily.    . benzonatate (TESSALON) 100 MG capsule Take 1 capsule (100 mg total) by mouth every 8 (eight) hours. (Patient not taking: Reported on 09/21/2017) 21 capsule 0    Objective: BP 116/66 (BP Location: Right Arm)   Temp 98.5 F (36.9 C) (Oral)   Resp 20   LMP 08/31/2017 (Approximate)   SpO2 99%  Exam: General: Well-appearing female sitting up on edge of bed in no acute distress Eyes: EOMI, PERRLA ENTM: moist mucous membranes Neck: supple, no JVD Cardiovascular: Regular rate and rhythm, no murmurs Respiratory: Clear to auscultation bilaterally, easy work of breathing Gastrointestinal: Soft, nondistended, positive bowel sounds, mild epigastric tenderness MSK: Muscle tone and bulk appropriate for age Derm: No rashes or wounds on  exposed skin Neuro: Cranial nerves grossly intact, AO x3 Psych: Mood and affect appropriate  Labs and Imaging: CBC BMET  Recent Labs  Lab 09/21/17 1345  WBC 4.5  HGB 7.8*  HCT 28.4*  PLT 364   Recent Labs  Lab 09/21/17 1345  NA 134*  K 3.5  CL 102  CO2 22  BUN 9  CREATININE 0.73  GLUCOSE 96  CALCIUM 9.5     No results found.   Sela Hilding, MD 09/21/2017, 5:27 PM PGY-2, [facility] pager: [phone], text pages welcome

## 2017-09-21 NOTE — ED Notes (Signed)
Please keep presenting complaint private from guests

## 2017-09-21 NOTE — ED Notes (Signed)
Per MD request, Poison Control called concerning ingestion. Per Poision control, main concern is for forgien body obstruction but no intervention is needed at this time.   MD ordered occult stool, will collect when room in A is available.

## 2017-09-21 NOTE — Progress Notes (Signed)
New pt admission from ED. Pt brought to the floor in stable condition. Vitals taken. Initial Assessment done. All immediate pertinent needs to patient addressed. Patient Guide given to patient. Important safety instructions relating to hospitalization reviewed with patient. Patient verbalized understanding. Will continue to monitor pt.  Rashanna Christiana, RN 

## 2017-09-21 NOTE — ED Provider Notes (Signed)
[facility] EMERGENCY DEPARTMENT Provider Note   CSN: [id number] Arrival date & time: 09/21/17  1243     History   Chief Complaint Chief Complaint  Patient presents with  . Swallowed Cement    HPI Breanna Miranda is a 32 y.o. female.  Patient presents with esophageal discomfort/burning and increase eating rocks for the past 2 weeks. Patient had pica proximally for 5 years ago and had a blood transfusion which resolves her symptoms. Patient's had recurrent symptoms the past 2 weeks. No blood in the stools no heavy menstrual periods recently. No active bleeding. No abdominal pain or fevers.patient does feel extreme fatigue and weakness     Past Medical History:  Diagnosis Date  . Anemia   . Anxiety   . Asthma   . Blood transfusion without reported diagnosis 2016  . Chiari I malformation ([place])   . Empty sella ([place])   . Irregular heart rhythm   . Menorrhagia     Patient Active Problem List   Diagnosis Date Noted  . Symptomatic anemia 09/21/2017  . Chest pain 08/15/2016  . Tachycardia 08/15/2016  . Hypokalemia 08/15/2016  . Hyperglycemia 08/15/2016  . Syncope 08/12/2014  . Menorrhagia 08/10/2014  . Anxiety 08/10/2014  . Anemia 05/06/2012    Past Surgical History:  Procedure Laterality Date  . CHOLECYSTECTOMY    . TUBAL LIGATION       OB History    Gravida  5   Para  4   Term  2   Preterm  2   AB  1   Living  4     SAB  1   TAB  0   Ectopic  0   Multiple  0   Live Births  4            Home Medications    Prior to Admission medications   Medication Sig Start Date End Date Taking? Authorizing Provider  acetaminophen (TYLENOL) 500 MG tablet Take 1,000 mg by mouth every 6 (six) hours as needed.   Yes [provider]  ranitidine (ZANTAC) 150 MG tablet Take 150 mg by mouth daily.   Yes [provider]  benzonatate (TESSALON) 100 MG capsule Take 1 capsule (100 mg total) by mouth every 8 (eight)  hours. Patient not taking: Reported on 09/21/2017 09/11/16   Barnet Glasgow, NP    Family History Family History  Problem Relation Age of Onset  . Diabetes Father   . Kidney disease Father        failure due to diabetes  . Hypertension Mother   . Heart attack Maternal Grandmother 63    Social History Social History   Tobacco Use  . Smoking status: Former Smoker    Packs/day: 0.10    Years: 2.00    Pack years: 0.20    Types: Cigarettes    Last attempt to quit: 05/13/2009    Years since quitting: 8.3  . Smokeless tobacco: Never Used  Substance Use Topics  . Alcohol use: Yes    Alcohol/week: 0.6 oz    Types: 1 Glasses of wine per week    Comment: occaional  . Drug use: No     Allergies   Penicillins and Shrimp [shellfish allergy]   Review of Systems Review of Systems  Constitutional: Positive for appetite change and fatigue. Negative for chills and fever.  HENT: Negative for congestion.   Eyes: Negative for visual disturbance.  Respiratory: Negative for shortness of breath.  Cardiovascular: Negative for chest pain.  Gastrointestinal: Negative for abdominal pain and vomiting.  Genitourinary: Negative for dysuria and flank pain.  Musculoskeletal: Negative for back pain, neck pain and neck stiffness.  Skin: Negative for rash.  Neurological: Positive for weakness and light-headedness. Negative for headaches.     Physical Exam Updated Vital Signs BP 116/66 (BP Location: Right Arm)   Temp 98.5 F (36.9 C) (Oral)   Resp 20   LMP 08/31/2017 (Approximate)   SpO2 99%   Physical Exam  Constitutional: She is oriented to person, place, and time. She appears well-developed and well-nourished.  HENT:  Head: Normocephalic and atraumatic.  Eyes: Conjunctivae are normal. Right eye exhibits no discharge. Left eye exhibits no discharge.  Neck: Normal range of motion. Neck supple. No tracheal deviation present.  Cardiovascular: Normal rate and regular rhythm.    Pulmonary/Chest: Effort normal and breath sounds normal.  Abdominal: Soft. She exhibits no distension. There is no tenderness. There is no guarding.  Musculoskeletal: She exhibits no edema.  Neurological: She is alert and oriented to person, place, and time.  Skin: Skin is warm. No rash noted. There is pallor.  Psychiatric: She has a normal mood and affect.  Nursing note and vitals reviewed.    ED Treatments / Results  Labs (all labs ordered are listed, but only abnormal results are displayed) Labs Reviewed  CBC - Abnormal; Notable for the following components:      Result Value   Hemoglobin 7.8 (*)    HCT 28.4 (*)    MCV 56.5 (*)    MCH 15.5 (*)    MCHC 27.5 (*)    RDW 19.3 (*)    All other components within normal limits  COMPREHENSIVE METABOLIC PANEL - Abnormal; Notable for the following components:   Sodium 134 (*)    Total Protein 8.2 (*)    All other components within normal limits  IRON AND TIBC  POC OCCULT BLOOD, ED  POC URINE PREG, ED  TYPE AND SCREEN    EKG None  Radiology No results found.  Procedures Procedures (including critical care time)  Medications Ordered in ED Medications  famotidine (PEPCID) tablet 20 mg (has no administration in time range)  acetaminophen (TYLENOL) tablet 1,000 mg (has no administration in time range)     Initial Impression / Assessment and Plan / ED Course  I have reviewed the triage vital signs and the nursing notes.  Pertinent labs & imaging results that were available during my care of the patient were reviewed by me and considered in my medical decision making (see chart for details).    Patient presents after swallowing a couple small pieces of rocks. Patient has been chewing on them the past 2 weeks. Patient has symptomatic anemia reviewed blood work hemoglobin in the sevens. Patient has no abdominal tenderness. Patient has no primary doctor follow-up. Discussed with medicine resident for admission for further  workup. Screening x-ray and  type and screen obtained.  The patients results and plan were reviewed and discussed.   Any x-rays performed were independently reviewed by myself.   Differential diagnosis were considered with the presenting HPI.  Medications  famotidine (PEPCID) tablet 20 mg (has no administration in time range)  acetaminophen (TYLENOL) tablet 1,000 mg (has no administration in time range)    Vitals:   09/21/17 1332  BP: 116/66  Resp: 20  Temp: 98.5 F (36.9 C)  TempSrc: Oral  SpO2: 99%    Final diagnoses:  Symptomatic anemia  Pica in adults    Admission/ observation were discussed with the admitting physician, patient and/or family and they are comfortable with the plan.    Final Clinical Impressions(s) / ED Diagnoses   Final diagnoses:  Symptomatic anemia  Pica in adults    ED Discharge Orders    None       Elnora Morrison, MD 09/21/17 1739

## 2017-09-22 ENCOUNTER — Observation Stay (HOSPITAL_COMMUNITY): Payer: Medicaid Other

## 2017-09-22 LAB — TYPE AND SCREEN
ABO/RH(D): B POS
ANTIBODY SCREEN: NEGATIVE
UNIT DIVISION: 0

## 2017-09-22 LAB — CBC
HCT: 29.6 % — ABNORMAL LOW (ref 36.0–46.0)
Hemoglobin: 8.7 g/dL — ABNORMAL LOW (ref 12.0–15.0)
MCH: 17.2 pg — ABNORMAL LOW (ref 26.0–34.0)
MCHC: 29.4 g/dL — ABNORMAL LOW (ref 30.0–36.0)
MCV: 58.5 fL — AB (ref 78.0–100.0)
PLATELETS: 407 10*3/uL — AB (ref 150–400)
RBC: 5.06 MIL/uL (ref 3.87–5.11)
RDW: 22.2 % — AB (ref 11.5–15.5)
WBC: 5 10*3/uL (ref 4.0–10.5)

## 2017-09-22 LAB — TSH: TSH: 1.887 u[IU]/mL (ref 0.350–4.500)

## 2017-09-22 LAB — BPAM RBC
Blood Product Expiration Date: 201904102359
ISSUE DATE / TIME: 201903312048
Unit Type and Rh: 7300

## 2017-09-22 LAB — HIV ANTIBODY (ROUTINE TESTING W REFLEX): HIV Screen 4th Generation wRfx: NONREACTIVE

## 2017-09-22 MED ORDER — POLYETHYLENE GLYCOL 3350 17 G PO PACK: 17.0000 g | PACK | Freq: Every day | ORAL | 0 refills | Status: DC

## 2017-09-22 MED ORDER — POLYETHYLENE GLYCOL 3350 17 G PO PACK: 17.0000 g | PACK | Freq: Every day | ORAL | Status: DC

## 2017-09-22 MED ORDER — FERROUS SULFATE 325 (65 FE) MG PO TABS: 325.0000 mg | ORAL_TABLET | Freq: Every day | ORAL | 0 refills | Status: DC

## 2017-09-22 MED ORDER — FERROUS SULFATE 325 (65 FE) MG PO TABS
325.0000 mg | ORAL_TABLET | Freq: Every day | ORAL | Status: DC
  Administered 2017-09-22: 325 mg via ORAL

## 2017-09-22 NOTE — Care Management Note (Signed)
Case Management Note  Patient Details  Name: Breanna Miranda MRN: [id number] Date of Birth: [birth year]  Subjective/Objective:  Admitted for Symptomatic Anemia                 Action/Plan:  Prior to admission patient lived at home.  Will be returning to the same living situation after discharge.  At discharge, patient has transportation home.  Patient has the ability to pay for  Medications/food.  In to speak with patient, discussed No PCP listed.  Patient agreed to appointment assistance regarding PCP.  NCM scheduled appointment with "[facility]" patient states she has been there before see D/C summary for information.  NCM also gave patient New Patient Packet and explained how she can submit to "[facility]" if she decides to pursue obtaining PCP with them.  Patient verbalized understanding.  Expected Discharge Date:                  Expected Discharge Plan:  Home/Self Care  In-House Referral:   N/A  Discharge planning Services  CM Consult, Follow-up appt scheduled  Provider Choice:   PCP Choice offered to:  Patient  Status of Service:  Completed, signed off  Kristen Cardinal, RN  Nurse case [place] 09/22/2017, 2:47 PM

## 2017-09-22 NOTE — Progress Notes (Addendum)
Family Medicine Teaching Service Daily Progress Note Intern Pager: [phone]  Patient name: Breanna Miranda Medical record number: [id number] Date of birth: 1986/04/23 Age: 32 y.o. Gender: female  Primary Care Provider: Patient, No Pcp Per Consultants: none Code Status: FULL   Pt Overview and Major Events to Date:  Breanna Miranda is a 32 yo female presenting with symptomatic anemia, PMH significant for menorrhagia and anxiety.   Assessment and Plan: Breanna Miranda is a 32 yo female presenting with symptomatic anemia, PMH significant for menorrhagia and anxiety.  Symptomatic anemia, improved  Patient with history of iron deficiency anemia with pica and menorrhagia since menarche in early teen years. Continues to have feel cold, but no longer short of breath with activity. Denies melena, GI bleeding source less likely. Hx of 4 vaginal births, one SAB, and postpartum tubal ligation. LMP one month ago, next menstrual cycle is due in the next week. Pt states OCP in the past caused worse bleeding. Unsure of family Hx of menorrhagia. -S/p transfusion of 1U pRBC 3/31  -Post-transfusion H/H 8.7/29.6 from 7.8/28.4 on admission  -Start PO Iron 325 mg qDaily  -Screen for vonWillebrand disease as outpatient   Abdominal Pain Pt complaining of intermittent left upper abdominal pain, unlike menstrual cramps in localization and quality. Possible 2/2 consumption of clay/rock.   Admits Hx of constipation.   -KUB 3/31: Moderate stool burden. No evidence of bowel obstruction or ileus. -Start Miralax daily  - Obtain complete abdominal U/S  - GI cocktail as needed  Anxiety -Used ativan the past, with positive effect.  -Pt is considering started SSRI   Social:  -Hx of ED visits for non-emergent health problems  -Schedule P[facility] at [facility]   FEN/GI: regular diet, GI cocktail as needed PPx: SCD  Disposition: Likely home   Subjective:  No acute events overnight. She admits decreased  craving for rocks and cement, and denies shortness of breath with activity or at rest. Patient describes intermittent left-sided and epigastric pain, burning of oral cavity attributed to injury from ingesting rocks, and new numbness/tingling of bilateral feet and ankles. She admits history of constipation, with no blood in stool.  Objective: Temp:  [97.6 F (36.4 C)-98.8 F (37.1 C)] 98.8 F (37.1 C) (04/01 0534) Pulse Rate:  [65-82] 79 (04/01 0534) Resp:  [16-18] 16 (04/01 0534) BP: (100-123)/(52-68) 100/66 (04/01 0534) SpO2:  [100 %] 100 % (04/01 0534) Weight:  [180 lb 8 oz (81.9 kg)] 180 lb 8 oz (81.9 kg) (03/31 1832)   Physical Exam: General: Well-appearing, NAD HEENT: Moist pale mucous membranes  Cardiovascular: RRR, No RMG, capillary refill <2 sec, peripheral pulses intact Respiratory: Clear to auscultation bilaterally Abdomen: Obese abdomen, tender to deep palpation in epigastric region and left lower quadrant, no rebound or guarding present, +bs  Extremities: No edema  Laboratory: Recent Labs  Lab 09/21/17 1345 09/22/17 0759  WBC 4.5 5.0  HGB 7.8* 8.7*  HCT 28.4* 29.6*  PLT 364 407*   Recent Labs  Lab 09/21/17 1345  NA 134*  K 3.5  CL 102  CO2 22  BUN 9  CREATININE 0.73  CALCIUM 9.5  PROT 8.2*  BILITOT 0.5  ALKPHOS 74  ALT 16  AST 23  GLUCOSE 96    Imaging/Diagnostic Tests:  EXAM: TRANSABDOMINAL AND TRANSVAGINAL ULTRASOUND OF PELVIS  TECHNIQUE: Study was performed transabdominally to optimize pelvic field of view evaluation and transvaginally to optimize internal visceral architecture evaluation.  COMPARISON:  None  FINDINGS: Uterus  Measurements: 9.5  x 6.4 x 6.7 cm. There is a hypoechoic mass near the superior aspect of the endometrium measuring 1.8 x 1.6 x 2.0 cm. A second small hypoechoic mass in the superior fundal region measures 1.4 x 1.4 x 1.7 cm. These masses are felt to represent small leiomyomas.  Endometrium  Thickness:  21 mm, abnormally thickened. Endometrial contour is smooth.  Right ovary  Measurements: 3.5 x 2.4 x 2.5 cm. There is a dominant follicle arising from the right ovary measuring 2.1 x 1.8 x 1.9 cm. No other extrauterine pelvic mass.  Left ovary  Measurements: 3.2 x 2.2 x 3.0 cm. Normal appearance/no adnexal mass.  Other findings  No abnormal free fluid.  IMPRESSION: 1. Diffuse endometrial thickening. Endometrial thickness is considered abnormal. Advise follow-up by [place] in 6-8 weeks, during the week immediately following menses (exam timing is critical). Given the degree of anemia, gynecologic consultation may well be warranted.  2.  Small leiomyomas in the superior uterus.  3. No extrauterine pelvic mass beyond a dominant physiologic follicle on the right. No free pelvic fluid.  Electronically Signed   By: Lowella Grip III M.D.   On: 09/22/2017 07:29  Lovenia Kim, MD 09/22/2017, 1:47 PM  [place] Intern pager: [phone], text pages welcome

## 2017-09-22 NOTE — Progress Notes (Signed)
Pt discharge to home instructions given, pt verbalized understanding.  VSS.  Denies pain.  Pt left floor ambulating.

## 2017-09-22 NOTE — Discharge Summary (Addendum)
[facility] Discharge Summary  Patient name: Breanna Miranda Medical record number: [id number] Date of birth: 1986/02/22 Age: 32 y.o. Gender: female Date of Admission: 09/21/2017  Date of Discharge: 09/22/2017 Admitting Physician: Lind Covert, MD  Primary Care Provider: Patient, No Pcp Per Consultants: None  Indication for Hospitalization: Symptomatic Anemia  Discharge Diagnoses/Problem List:  Patient Active Problem List   Diagnosis Date Noted  . Symptomatic anemia 09/21/2017  . Pica in adults   . Chest pain 08/15/2016  . Tachycardia 08/15/2016  . Hypokalemia 08/15/2016  . Hyperglycemia 08/15/2016  . Syncope 08/12/2014  . Menorrhagia 08/10/2014  . Anxiety 08/10/2014  . Anemia 05/06/2012    Disposition: Home   Discharge Condition: Improved  Discharge Exam: General: Well-appearing, NAD  HEENT: Moist pale mucous membranes  Cardiovascular: RRR, No MRG, capillary refill <2 sec, peripheral pulses intact  Respiratory: Clear to auscultation bilaterally, normal effort  Abdomen: Obese abdomen, tender to deep palpation in epigastric region and left upper quadrant, +BS  Extremities: No edema  Brief Hospital Course:  Patient presented 3/31 for symptomatic anemia for several weeks. PMHx significant for menorrhagia and anxiety, 4 vaginal births, 1 SAB, and postpartum tubal ligation.  At time of presentation, patient also endorsed shortness of breath with mild activity and constantly feeling cold. She denied melena, so GI bleed less likely. H/H on admission was 7.8/28.4, Iron 12, Saturation ratios 2%, and negative FOBT, and TSH within normal limits.  She recieved transfusion of 1U pRBCs with improvement of H/H to 8.7/29.6, reduced pica cravings, and no shortness of breath overnight. Transabdominal and transvaginal pelvic U/S was conducted which revealed abnormal diffuse endometrial thickening, small leiomyomas of superior uterus, and no extrauterine  masses beyond physiological ovarian follicle. Radiology recommended repeat U/S in 6-8 weeks, during the week immediately following menses (exam timing is critical), and follow-up with gynecology given the degree of anemia. Patient was started on oral iron supplementation which is to be continued as outpatient with repeat CBC in a few weeks.  Patient also complained of intermittent epigastric and left-sided abdominal, thought to be secondary to ingestion of rocks and cement. KUB revealed moderate stool burden and no evidence of bowel obstruction or ileus. 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 She was started on miralax for complaint of constipation, to be continued outpatient. For anxiety, patient endorses efficacy of prior Ativan use. She will consider starting an SSRI outpatient.   Issues for Follow Up:  1. Repeat pelvic U/S in 6-8 weeks, must be performed one week following menstrual cycle (exam timing is critical) 2. F/u outpatient with OB/GYN 3. F/u abdominal pain for resolution  4. Iron deficiency anemia. Patient was started on oral Fe supplement. She will need repeat CBC in 2-3 weeks.  5. Screen for vW disease outpatient   6. Initiate anxiety treatment with SSRI   Significant Procedures: 1 unit of packed RBC transfused  Significant Labs and Imaging:  Recent Labs  Lab 09/21/17 1345 09/22/17 0759  WBC 4.5 5.0  HGB 7.8* 8.7*  HCT 28.4* 29.6*  PLT 364 407*   Recent Labs  Lab 09/21/17 1345  NA 134*  K 3.5  CL 102  CO2 22  GLUCOSE 96  BUN 9  CREATININE 0.73  CALCIUM 9.5  ALKPHOS 74  AST 23  ALT 16  ALBUMIN 4.1    Results/Tests Pending at Time of Discharge:   Discharge Medications:  Allergies as of 09/22/2017  Reactions   Penicillins Anaphylaxis, Hives, Itching   Has patient had a PCN reaction causing immediate rash, facial/tongue/throat swelling, SOB or lightheadedness with  hypotension: Yes Has patient had a PCN reaction causing severe rash involving mucus membranes or skin necrosis: No Has patient had a PCN reaction that required hospitalization No Has patient had a PCN reaction occurring within the last 10 years: Yes If all of the above answers are "NO", then may proceed with Cephalosporin use.   Shrimp [shellfish Allergy] Anaphylaxis, Hives, Itching      Medication List    STOP taking these medications   acetaminophen 500 MG tablet Commonly known as:  TYLENOL   benzonatate 100 MG capsule Commonly known as:  TESSALON   ranitidine 150 MG tablet Commonly known as:  ZANTAC     TAKE these medications   ferrous sulfate 325 (65 FE) MG tablet Take 1 tablet (325 mg total) by mouth daily with breakfast. Start taking on:  09/23/2017   polyethylene glycol packet Commonly known as:  MIRALAX / GLYCOLAX Take 17 g by mouth daily.       Discharge Instructions: Please refer to Patient Instructions section of EMR for full details.  Patient was counseled important signs and symptoms that should prompt return to medical care, changes in medications, dietary instructions, activity restrictions, and follow up appointments.   Follow-Up Appointments: Follow-up [facility]. Go on 11/03/2017.   Why:  9:15 AM appointment to establish Primary Care Physician Contact information: [address] [place], [place] [address]  Phone: [place], [place], DO. Go on 09/24/2017.   Specialty:  Family Medicine Why:  Appointment at 9:45 AM.  Please arrive by 9:30 AM.  Contact information: [address] [place] [address] [phone]           Lovenia Kim, MD 09/22/2017, 3:39 PM  [place]

## 2017-09-22 NOTE — Progress Notes (Signed)
Pt gone down for abd [place].

## 2017-09-24 ENCOUNTER — Inpatient Hospital Stay: Payer: Medicaid Other | Admitting: Family Medicine

## 2017-10-02 ENCOUNTER — Ambulatory Visit (INDEPENDENT_AMBULATORY_CARE_PROVIDER_SITE_OTHER): Payer: Medicaid Other | Admitting: Internal Medicine

## 2017-10-02 ENCOUNTER — Encounter: Payer: Self-pay | Admitting: Internal Medicine

## 2017-10-02 ENCOUNTER — Other Ambulatory Visit: Payer: Self-pay

## 2017-10-02 VITALS — BP 100/68 | HR 85 | Temp 98.3°F | Ht 64.0 in | Wt 188.6 lb

## 2017-10-02 DIAGNOSIS — N920 Excessive and frequent menstruation with regular cycle: Secondary | ICD-10-CM

## 2017-10-02 DIAGNOSIS — D649 Anemia, unspecified: Secondary | ICD-10-CM

## 2017-10-02 NOTE — Patient Instructions (Signed)
Ms. Raczkowski,  I will call with your lab results. Continue iron supplementation.   I will refer you to gynecology.  Please ask for a wellness visit at your convenience. This would be a good time to discuss anxiety. You were seen by our team in the hospital, so we should be able to get you scheduled without a new patient appointment.  For anxiety, try some meditating apps like "Headspace."   Best, Dr. Ola Spurr

## 2017-10-03 ENCOUNTER — Encounter: Payer: Self-pay | Admitting: Internal Medicine

## 2017-10-03 LAB — CBC
HEMATOCRIT: 26.3 % — AB (ref 34.0–46.6)
HEMOGLOBIN: 7.5 g/dL — AB (ref 11.1–15.9)
MCH: 17.8 pg — AB (ref 26.6–33.0)
MCHC: 28.5 g/dL — ABNORMAL LOW (ref 31.5–35.7)
MCV: 63 fL — ABNORMAL LOW (ref 79–97)
Platelets: 325 10*3/uL (ref 150–379)
RBC: 4.21 x10E6/uL (ref 3.77–5.28)
RDW: 26 % — ABNORMAL HIGH (ref 12.3–15.4)
WBC: 5.1 10*3/uL (ref 3.4–10.8)

## 2017-10-03 LAB — VON WILLEBRAND ANTIGEN: Von Willebrand Ag: 225 % — ABNORMAL HIGH (ref 50–200)

## 2017-10-03 NOTE — Assessment & Plan Note (Signed)
-   Improved but has had resumption of pica with current menstrual cycle. - Ordered follow-up ultrasound for mid-May.  - Referred to Gynecology for discussion of treatment options. - Ordered CBC and vWF testing.  - Recommended continuing iron supplementation and counseled to take with acidic drink like OJ and preferably not around mealtime.

## 2017-10-03 NOTE — Progress Notes (Signed)
[facility] Progress Note  Subjective:  Breanna Miranda is a 32 y.o. female with history of anxiety and symptomatic anemia. She presents for hospital follow-up for symptomatic anemia. Cause thought to be ongoing menorrhagia. Negative FOBT. She was hospitalized from 09/21/17-09/22/17. She had hemaglobin of 7.8 with Iron 12 with improvement of hemoglobin to 8.7 after transfusion of 1U pRBCs. She had a pelvic ultrasound with diffuse endometrial thickening and small leiomyomas with recommendation to repeat US in 6-8 weeks 1 week following menses. Patient reports she initially felt much better but has had return of pica this past week. She denies dyspnea. She is currently menstruating. She says she tried OCPs in the past but actually had heavier bleeding. She had tubal ligation after last pregnancy. She has not seen a gynecologist for this. Denies having family members with similar issues or needing hysterectomy. She has been taking iron supplementation along with miralax daily without issue.  Patient also reports she would like to be a [facility] patient at [facility]. She completed new patient packet, including the following information:  PMH: Anxiety (takes prn ativan) Anemia (on iron supplementation) Arthritis Acid reflux (zantac daily)  Takes vit c, vit b, probiotic, biotene, vit d supplements.   Family History: - mother with "borderline" diabetes, HTN, and mood disorder - sister with mood disorder - Heart disease in grandparents - Father died from complications of diabetes (kidney failure)  Social: - Works full-time at [employer] - Has 4 children, 2 sons and 2 daughters (ages 39, 33, 33, 19) - Owns care - Mother would make medical decisions for patient if she were unable - Former smoker (smoked for 1 year 2009-10) - Does not use drugs - Drinks about 2 glasses of wine a week - Enjoys spending time with her children     Allergies  Allergen Reactions  .  Penicillins Anaphylaxis, Hives and Itching    Has patient had a PCN reaction causing immediate rash, facial/tongue/throat swelling, SOB or lightheadedness with hypotension: Yes Has patient had a PCN reaction causing severe rash involving mucus membranes or skin necrosis: No Has patient had a PCN reaction that required hospitalization No Has patient had a PCN reaction occurring within the last 10 years: Yes If all of the above answers are "NO", then may proceed with Cephalosporin use.   . Shrimp [Shellfish Allergy] Anaphylaxis, Hives and Itching    Social History   Tobacco Use  . Smoking status: Former Smoker    Packs/day: 0.10    Years: 2.00    Pack years: 0.20    Types: Cigarettes    Last attempt to quit: 05/13/2009    Years since quitting: 8.3  . Smokeless tobacco: Never Used  Substance Use Topics  . Alcohol use: Yes    Alcohol/week: 0.6 oz    Types: 1 Glasses of wine per week    Comment: occaional    Objective: Blood pressure 100/68, pulse 85, temperature 98.3 F (36.8 C), temperature source Oral, height 5\' 4"  (1.626 m), weight 188 lb 9.6 oz (85.5 kg), last menstrual period 09/27/2017, SpO2 99 %. Body mass index is 32.37 kg/m. Constitutional: Well-appearing female in NAD HENT: Conjunctivae somewhat pale.  Cardiovascular: RRR, S1, S2, no m/r/g. Brisk capillary refill. Pulmonary/Chest: Effort normal and breath sounds normal.  Skin: Skin is warm and dry. No rash noted.  Psychiatric: Normal mood and affect.  Vitals reviewed  Assessment/Plan: Symptomatic anemia - Improved but has had resumption of pica with current menstrual cycle. - Ordered  follow-up ultrasound for mid-May.  - Referred to Gynecology for discussion of treatment options. - Ordered CBC and vWF testing.  - Recommended continuing iron supplementation and counseled to take with acidic drink like OJ and preferably not around mealtime.  Follow-up pending CBC results. To return for general check-up and to  discuss anxiety further (receptive to idea of SSRI therapy; suggested some guided meditation in the meantime).   Olene Floss, MD [place], PGY-3

## 2017-10-15 ENCOUNTER — Encounter: Payer: Self-pay | Admitting: Family Medicine

## 2017-10-15 ENCOUNTER — Other Ambulatory Visit: Payer: Self-pay

## 2017-10-15 ENCOUNTER — Ambulatory Visit (INDEPENDENT_AMBULATORY_CARE_PROVIDER_SITE_OTHER): Payer: Medicaid Other | Admitting: Family Medicine

## 2017-10-15 VITALS — BP 100/62 | HR 83 | Temp 98.4°F | Wt 187.0 lb

## 2017-10-15 DIAGNOSIS — F419 Anxiety disorder, unspecified: Secondary | ICD-10-CM | POA: Diagnosis not present

## 2017-10-15 DIAGNOSIS — D500 Iron deficiency anemia secondary to blood loss (chronic): Secondary | ICD-10-CM

## 2017-10-15 DIAGNOSIS — G4700 Insomnia, unspecified: Secondary | ICD-10-CM | POA: Diagnosis not present

## 2017-10-15 LAB — POCT HEMOGLOBIN: Hemoglobin: 9 g/dL — AB (ref 12.2–16.2)

## 2017-10-15 MED ORDER — RAMELTEON 8 MG PO TABS: 8.0000 mg | ORAL_TABLET | Freq: Every day | ORAL | 0 refills | Status: DC

## 2017-10-15 NOTE — Assessment & Plan Note (Signed)
PAtinet with chronic anxiety.  Feels she is happier now after seperation from significant other than she has been.   Her sister teaches meditation and that has been helpful.   She stopped taking her anxiety meds because she wanted to "do it naturally" and it's been harder to manage but she is optimistic.  Would like to establish mental health primary care so she is given Dr. Tod Persia card to arrange appt.

## 2017-10-15 NOTE — Assessment & Plan Note (Addendum)
Hgb 9.5 today.  Was down to 7.5 from 8.7 after heavy menses, she was told to double iron intake at last visit.  Told to continue the "double" iron intake and check back in two weeks for Hgb labs

## 2017-10-15 NOTE — Progress Notes (Signed)
    Subjective:  Breanna Miranda is a 32 y.o. female who presents to the [facility] today with a chief complaint of insomnia/fatigue.  Insomnia Has trouble falling asleep at night, claims less than a few hours each night.  Has poor sleep hygiene with screen time/light in room.  Does not use illicit substances.  Has been getting tired during day but is not able to fall asleep at night   Anemia Hgb 9.5 today.  Was down to 7.5 from 8.7 after heavy menses, she was told to double iron intake at last visit.  She is please with her results  Anxiety PAtinet with chronic anxiety.  Feels she is happier now after seperation from significant other than she has been.   Her sister teaches meditation and that has been helpful.   She stopped taking her anxiety meds because she wanted to "do it naturally" and it's been harder to manage but she is optimistic.  No SI/HI  Would like to establish mental health primary care so she is given Dr. Tod Persia card to arrange appt.   GAD 7 : Generalized Anxiety Score 10/15/2017  Nervous, Anxious, on Edge 3  Control/stop worrying 3  Worry too much - different things 3  Trouble relaxing 3  Restless 3  Easily annoyed or irritable 3  Afraid - awful might happen 3  Total GAD 7 Score 21    Objective:  Physical Exam: BP 100/62   Pulse 83   Temp 98.4 F (36.9 C) (Oral)   Wt 187 lb (84.8 kg)   LMP 09/27/2017   SpO2 99%   BMI 32.10 kg/m   Gen: NAD, sitting comfortably CV: RRR with no murmurs appreciated.  Cap refil ~2. Pulm: NWOB, CTAB with no crackles, wheezes, or rhonchi GI: Normal bowel sounds present. Soft, Nontender, Nondistended. MSK: no edema, cyanosis, or clubbing noted Skin: warm, dry Neuro: grossly normal, moves all extremities Psych: Normal affect and thought content  Results for orders placed or performed in visit on 10/15/17 (from the past 72 hour(s))  POCT hemoglobin     Status: Abnormal   Collection Time: 10/15/17 10:59 AM  Result Value Ref Range    Hemoglobin 9.0 (A) 12.2 - 16.2 g/dL     Assessment/Plan:  Insomnia Has trouble falling asleep at night, claims less than a few hours each night  Discussed sleep hygiene and rx'd ramelteon  Anemia Hgb 9.5 today.  Was down to 7.5 from 8.7 after heavy menses, she was told to double iron intake at last visit.  Told to continue the "double" iron intake and check back in two weeks for Hgb labs  Anxiety PAtinet with chronic anxiety.  Feels she is happier now after seperation from significant other than she has been.   Her sister teaches meditation and that has been helpful.   She stopped taking her anxiety meds because she wanted to "do it naturally" and it's been harder to manage but she is optimistic.  Would like to establish mental health primary care so she is given Dr. Tod Persia card to arrange appt.   Sherene Sires, DO FAMILY MEDICINE RESIDENT - PGY1 10/15/2017 1:54 PM

## 2017-10-15 NOTE — Patient Instructions (Signed)
It was a pleasure to see you today! Thank you for choosing [facility] for your primary care. Breanna Miranda was seen for insomnia, anxiety and fatigue. Come back to the clinic if you have any new concerning smyptoms, and go to the emergency room if you have any life threatening symptoms.  Today we prescrbied ramelteon for your insomnia.  We checked your blood counts and are having you continue the iron at double dose for the next 2 weeks.   We're also connecting you with Dr. Gwenlyn Saran, our psychologist.    If we did any lab work today, and the results require attention, either me or my nurse will get in touch with you. If everything is normal, you will get a letter in mail and a message via . If you don't hear from [place] in two weeks, please give [place] a call. Otherwise, we look forward to seeing you again at your next visit. If you have any questions or concerns before then, please call the clinic at [phone].  Please bring all your medications to every doctors visit  Sign up for My Chart to have easy access to your labs results, and communication with your Primary care physician.    Please check-out at the front desk before leaving the clinic.    Best,  Dr. Sherene Sires FAMILY MEDICINE RESIDENT - PGY1 10/15/2017 11:57 AM

## 2017-10-15 NOTE — Assessment & Plan Note (Signed)
Has trouble falling asleep at night, claims less than a few hours each night  Discussed sleep hygiene and rx'd ramelteon

## 2017-10-16 ENCOUNTER — Encounter: Payer: Self-pay | Admitting: Obstetrics & Gynecology

## 2017-11-03 ENCOUNTER — Ambulatory Visit: Payer: Medicaid Other | Admitting: Student

## 2017-11-03 ENCOUNTER — Other Ambulatory Visit (HOSPITAL_COMMUNITY)
Admission: RE | Admit: 2017-11-03 | Discharge: 2017-11-03 | Disposition: A | Discharge status: Home / Self Care | Payer: Medicaid Other | Source: Ambulatory Visit | Attending: Family Medicine | Admitting: Family Medicine

## 2017-11-03 ENCOUNTER — Other Ambulatory Visit: Payer: Self-pay

## 2017-11-03 ENCOUNTER — Encounter: Payer: Self-pay | Admitting: Student

## 2017-11-03 VITALS — BP 108/62 | HR 66 | Temp 98.3°F | Ht 64.0 in | Wt 187.4 lb

## 2017-11-03 DIAGNOSIS — N939 Abnormal uterine and vaginal bleeding, unspecified: Secondary | ICD-10-CM

## 2017-11-03 DIAGNOSIS — Z124 Encounter for screening for malignant neoplasm of cervix: Secondary | ICD-10-CM | POA: Diagnosis not present

## 2017-11-03 DIAGNOSIS — N898 Other specified noninflammatory disorders of vagina: Secondary | ICD-10-CM | POA: Diagnosis not present

## 2017-11-03 LAB — POCT WET PREP (WET MOUNT)
Clue Cells Wet Prep Whiff POC: POSITIVE
Trichomonas Wet Prep HPF POC: ABSENT

## 2017-11-03 LAB — POCT HEMOGLOBIN: HEMOGLOBIN: 10.2 g/dL — AB (ref 12.2–16.2)

## 2017-11-03 MED ORDER — METRONIDAZOLE 500 MG PO TABS: 500.0000 mg | ORAL_TABLET | Freq: Two times a day (BID) | ORAL | 0 refills | Status: AC

## 2017-11-03 NOTE — Progress Notes (Signed)
Subjective:    Breanna Miranda is a 32 y.o. old female here for abnormal uterine bleeding.  HPI Patient with history of abnormal uterine bleeding.  She reports having heavy period for the last two years.  She states his cycle varies from 3 weeks to 5 weeks.  Cycle lasts about 5 days.  She reports very heavy period and severe pain for the first 3 days that she has to miss work.  This has been going on for 2 years.  She was recently evaluated for this problem.  She had a pelvic ultrasound that showed diffuse endometrial thickening that was considered abnormal.  Repeat ultrasound in 6 to 8 weeks and gynecology consultation was recommended.  She has a repeat ultrasound in 2 days.  She has a initial gyn appointment in a month.  She had BTL about 9 years ago.  LMP 10/28/2017. Patient denies dizziness and palpitation.  She denies dyspareunia.  Vaginal discharge: Reports foul-smelling vaginal discharge for 2 days.  She also reports pruritus.  Reports prior history of yeast infection.  Due for Pap smear. PMH/Problem List: has Anemia; Menorrhagia; Anxiety; Syncope; Chest pain; Tachycardia; Hypokalemia; Hyperglycemia; Symptomatic anemia; Pica in adults; Insomnia; Vaginal discharge; and Screening for cervical cancer on their problem list.   has a past medical history of Anemia, Anxiety, Asthma, Blood transfusion without reported diagnosis (2016), Chiari I malformation ([place]), Empty sella ([place]), Irregular heart rhythm, and Menorrhagia.  FH:  Family History  Problem Relation Age of Onset  . Diabetes Father   . Kidney disease Father        failure due to diabetes  . Hypertension Mother   . Heart attack Maternal Grandmother 28    SH Social History   Tobacco Use  . Smoking status: Former Smoker    Packs/day: 0.10    Years: 2.00    Pack years: 0.20    Types: Cigarettes    Last attempt to quit: 05/13/2009    Years since quitting: 8.4  . Smokeless tobacco: Never Used  Substance Use Topics  . Alcohol use:  Yes    Alcohol/week: 0.6 oz    Types: 1 Glasses of wine per week    Comment: occaional  . Drug use: No    Review of Systems Review of systems negative except for pertinent positives and negatives in history of present illness above.     Objective:     Vitals:   11/03/17 1116  BP: 108/62  Pulse: 66  Temp: 98.3 F (36.8 C)  TempSrc: Oral  SpO2: 98%  Weight: 187 lb 6.4 oz (85 kg)  Height: 5\' 4"  (1.626 m)   Body mass index is 32.17 kg/m.  Physical Exam  GEN: appears well & comfortable. No apparent distress. Head: normocephalic and atraumatic  Eyes: conjunctiva without injection. Sclera anicteric.  No conjunctival pallor HEM: negative for inguinal lymphadenopathy. CVS: RRR, nl s1 & s2, no murmurs, no edema RESP: no IWOB, good air movement bilaterally, CTAB GI: BS present & normal, soft, NTND GU: Pelvic Exam No suprapubic or CVA tenerness External genitalia: normal without surrounding skin lesion or obvious bleeeding.   Speculum: pink vaginal mucosa, ruggated and normal cervix. Foul-smelling minimal whitish milky discharge. Bimanual: no cervical motion tenderness or adnexal mass. Uterus appears normal size SKIN: no apparent skin lesion  NEURO: alert and oiented appropriately, no gross deficits   PSYCH: euthymic mood with congruent affect    Assessment and Plan:  1. Abnormal uterine bleeding (AUB): No active bleeding on pelvic/speculum exam.  No palpable mass.  Point-of-care hemoglobin 10.2 today.  Most recent hemoglobin 9.0 about 1 month ago.  She has no symptoms of anemia.  Recommended continuing iron for anemia, and NSAIDs for pain. She has a repeat ultrasound scheduled in 2 days.  She has a initial gyn appointment in a month.  2. Vaginal discharge: with milky foul-smelling discharge.  Wet prep with clue cells consistent with bacterial vaginosis.  Discussed finding over the phone with the patient.  Will treat with metronidazole 500 mg twice daily for 7 days.  Recommended  not drinking alcohol while taking this medication. - Cervicovaginal ancillary only  3. Screening for cervical cancer - Cytology - PAP  Return if symptoms worsen or fail to improve.  Mercy Riding, MD 11/03/17 Pager: [phone]

## 2017-11-03 NOTE — Patient Instructions (Addendum)
Patient left before AVS was printed. Called and discussed wet prep result over the phone.  Sent prescription for metronidazole to her pharmacy.  Recommended not drinking alcohol while taking this medication.

## 2017-11-04 LAB — CERVICOVAGINAL ANCILLARY ONLY
Chlamydia: NEGATIVE
NEISSERIA GONORRHEA: NEGATIVE
Trichomonas: NEGATIVE

## 2017-11-05 ENCOUNTER — Other Ambulatory Visit: Payer: Self-pay | Admitting: Internal Medicine

## 2017-11-05 ENCOUNTER — Ambulatory Visit (HOSPITAL_COMMUNITY)
Admission: RE | Admit: 2017-11-05 | Discharge: 2017-11-05 | Disposition: A | Discharge status: Home / Self Care | Payer: Medicaid Other | Source: Ambulatory Visit | Attending: Family Medicine | Admitting: Family Medicine

## 2017-11-05 DIAGNOSIS — D259 Leiomyoma of uterus, unspecified: Secondary | ICD-10-CM | POA: Insufficient documentation

## 2017-11-05 DIAGNOSIS — N920 Excessive and frequent menstruation with regular cycle: Secondary | ICD-10-CM | POA: Diagnosis not present

## 2017-11-06 LAB — CYTOLOGY - PAP: HPV: DETECTED — AB

## 2017-11-20 ENCOUNTER — Other Ambulatory Visit: Payer: Self-pay | Admitting: Family Medicine

## 2017-11-20 ENCOUNTER — Encounter: Payer: Self-pay | Admitting: Family Medicine

## 2017-11-20 ENCOUNTER — Other Ambulatory Visit: Payer: Self-pay

## 2017-11-20 ENCOUNTER — Ambulatory Visit (INDEPENDENT_AMBULATORY_CARE_PROVIDER_SITE_OTHER): Payer: Medicaid Other | Admitting: Family Medicine

## 2017-11-20 VITALS — BP 104/62 | HR 65 | Temp 98.4°F | Wt 183.0 lb

## 2017-11-20 DIAGNOSIS — R87612 Low grade squamous intraepithelial lesion on cytologic smear of cervix (LGSIL): Secondary | ICD-10-CM

## 2017-11-20 LAB — POCT URINE PREGNANCY: Preg Test, Ur: NEGATIVE

## 2017-11-20 NOTE — Progress Notes (Signed)
Patient ID: Breanna Miranda, female   DOB: October 12, 1985, 32 y.o.   MRN: [id number]    HPI Breanna Miranda is a 32 y.o. female.   She has hx of abnormal PAP. HPI  Indications: Pap smear on May 2019 showed: low-grade squamous intraepithelial neoplasia (LGSIL - encompassing HPV,mild dysplasia,CIN I). Previous colposcopy: NA. Prior cervical treatment: no treatment.  Past Medical History:  Diagnosis Date  . Anemia   . Anxiety   . Asthma   . Blood transfusion without reported diagnosis 2016  . Chiari I malformation ([place])   . Empty sella ([place])   . Irregular heart rhythm   . Menorrhagia     Past Surgical History:  Procedure Laterality Date  . CHOLECYSTECTOMY    . TUBAL LIGATION      Family History  Problem Relation Age of Onset  . Diabetes Father   . Kidney disease Father        failure due to diabetes  . Hypertension Mother   . Heart attack Maternal Grandmother 63    Social History Social History   Tobacco Use  . Smoking status: Former Smoker    Packs/day: 0.10    Years: 2.00    Pack years: 0.20    Types: Cigarettes    Last attempt to quit: 05/13/2009    Years since quitting: 8.5  . Smokeless tobacco: Never Used  Substance Use Topics  . Alcohol use: Yes    Alcohol/week: 0.6 oz    Types: 1 Glasses of wine per week    Comment: occaional  . Drug use: No    Allergies  Allergen Reactions  . Penicillins Anaphylaxis, Hives and Itching    Has patient had a PCN reaction causing immediate rash, facial/tongue/throat swelling, SOB or lightheadedness with hypotension: Yes Has patient had a PCN reaction causing severe rash involving mucus membranes or skin necrosis: No Has patient had a PCN reaction that required hospitalization No Has patient had a PCN reaction occurring within the last 10 years: Yes If all of the above answers are "NO", then may proceed with Cephalosporin use.   . Shrimp [Shellfish Allergy] Anaphylaxis, Hives and Itching    Current Outpatient  Medications  Medication Sig Dispense Refill  . ferrous sulfate 325 (65 FE) MG tablet Take 1 tablet (325 mg total) by mouth daily with breakfast. 30 tablet 0  . polyethylene glycol (MIRALAX / GLYCOLAX) packet Take 17 g by mouth daily. 14 each 0  . ramelteon (ROZEREM) 8 MG tablet Take 1 tablet (8 mg total) by mouth at bedtime. 30 tablet 0   No current facility-administered medications for this visit.     Review of Systems Review of Systems  Respiratory: Negative.   Cardiovascular: Negative.   Genitourinary: Negative for pelvic pain and vaginal discharge.       Abnormal PAP  All other systems reviewed and are negative.   Last menstrual period 10/28/2017.  Physical Exam Physical Exam  Constitutional: She appears well-developed. No distress.  Pulmonary/Chest: Effort normal.  Genitourinary: Pelvic exam was performed with patient supine. There is no lesion on the left labia. Right adnexum displays no mass. Left adnexum displays no mass. No tenderness in the vagina. No vaginal discharge found.    Nursing note and vitals reviewed.   Data Reviewed 11/20/17  Assessment    Procedure Details  The risks and benefits of the procedure and Written informed consent obtained.  Speculum placed in vagina and excellent visualization of cervix achieved, cervix swabbed x 3 with  acetic acid solution. Lugol's iodine applied as well.  Specimens: ECC & Cervical biopsy  Complications: Mild bleeding resolved with Monsel application.     Plan    Specimens labelled and sent to Pathology. I will call with result.      Breanna Miranda 11/20/2017, 9:46 AM

## 2017-11-20 NOTE — Patient Instructions (Signed)
Colposcopy, Care After  This sheet gives you information about how to care for yourself after your procedure. Your doctor may also give you more specific instructions. If you have problems or questions, contact your doctor.  What can I expect after the procedure?  If you did not have a tissue sample removed (did not have a biopsy), you may only have some spotting for a few days. You can go back to your normal activities.  If you had a tissue sample removed, it is common to have:  · Soreness and pain. This may last for a few days.  · Light-headedness.  · Mild bleeding from your vagina or dark-colored, grainy discharge from your vagina. This may last for a few days. You may need to wear a sanitary pad.  · Spotting for at least 48 hours after the procedure.    Follow these instructions at home:  · Take over-the-counter and prescription medicines only as told by your doctor. Ask your doctor what medicines you can start taking again. This is very important if you take blood-thinning medicine.  · Do not drive or use heavy machinery while taking prescription pain medicine.  · For 3 days, or as long as your doctor tells you, avoid:  ? Douching.  ? Using tampons.  ? Having sex.  · If you use birth control (contraception), keep using it.  · Limit activity for the first day after the procedure. Ask your doctor what activities are safe for you.  · It is up to you to get the results of your procedure. Ask your doctor when your results will be ready.  · Keep all follow-up visits as told by your doctor. This is important.  Contact a doctor if:  · You get a skin rash.  Get help right away if:  · You are bleeding a lot from your vagina. It is a lot of bleeding if you are using more than one pad an hour for 2 hours in a row.  · You have clumps of blood (blood clots) coming from your vagina.  · You have a fever.  · You have chills  · You have pain in your lower belly (pelvic area).  · You have signs of infection, such as vaginal  discharge that is:  ? Different than usual.  ? Yellow.  ? Bad-smelling.  · You have very pain or cramps in your lower belly that do not get better with medicine.  · You feel light-headed.  · You feel dizzy.  · You pass out (faint).  Summary  · If you did not have a tissue sample removed (did not have a biopsy), you may only have some spotting for a few days. You can go back to your normal activities.  · If you had a tissue sample removed, it is common to have mild pain and spotting for 48 hours.  · For 3 days, or as long as your doctor tells you, avoid douching, using tampons and having sex.  · Get help right away if you have bleeding, very bad pain, or signs of infection.  This information is not intended to replace advice given to you by your health care provider. Make sure you discuss any questions you have with your health care provider.  Document Released: 11/27/2007 Document Revised: 02/28/2016 Document Reviewed: 02/28/2016  Elsevier Interactive Patient Education © 2018 Elsevier Inc.

## 2017-11-25 ENCOUNTER — Telehealth: Payer: Self-pay | Admitting: Family Medicine

## 2017-11-25 NOTE — Telephone Encounter (Signed)
Cervical biopsy result discussed with the patient. Unfortunately, there was inadequate cervical specimen for pathology. I recommended repeat colpo and she agreed. Appointment scheduled.

## 2017-12-08 ENCOUNTER — Encounter: Payer: Self-pay | Admitting: Obstetrics & Gynecology

## 2017-12-08 ENCOUNTER — Ambulatory Visit (INDEPENDENT_AMBULATORY_CARE_PROVIDER_SITE_OTHER): Payer: Medicaid Other | Admitting: Obstetrics & Gynecology

## 2017-12-08 VITALS — BP 107/89 | HR 75 | Wt 187.9 lb

## 2017-12-08 DIAGNOSIS — D649 Anemia, unspecified: Secondary | ICD-10-CM | POA: Diagnosis present

## 2017-12-08 DIAGNOSIS — N920 Excessive and frequent menstruation with regular cycle: Secondary | ICD-10-CM | POA: Diagnosis not present

## 2017-12-08 MED ORDER — MEGESTROL ACETATE 40 MG PO TABS: 40.0000 mg | ORAL_TABLET | Freq: Two times a day (BID) | ORAL | 3 refills | Status: DC

## 2017-12-08 MED ORDER — DICLOFENAC SODIUM 75 MG PO TBEC: 75.0000 mg | DELAYED_RELEASE_TABLET | Freq: Two times a day (BID) | ORAL | 2 refills | Status: DC

## 2017-12-08 NOTE — Patient Instructions (Signed)
Endometrial Ablation Endometrial ablation is a procedure that destroys the thin inner layer of the lining of the uterus (endometrium). This procedure may be done:  To stop heavy periods.  To stop bleeding that is causing anemia.  To control irregular bleeding.  To treat bleeding caused by small tumors (fibroids) in the endometrium.  This procedure is often an alternative to major surgery, such as removal of the uterus and cervix (hysterectomy). As a result of this procedure:  You may not be able to have children. However, if you are premenopausal (you have not gone through menopause): ? You may still have a small chance of getting pregnant. ? You will need to use a reliable method of birth control after the procedure to prevent pregnancy.  You may stop having a menstrual period, or you may have only a small amount of bleeding during your period. Menstruation may return several years after the procedure.  Tell a health care provider about:  Any allergies you have.  All medicines you are taking, including vitamins, herbs, eye drops, creams, and over-the-counter medicines.  Any problems you or family members have had with the use of anesthetic medicines.  Any blood disorders you have.  Any surgeries you have had.  Any medical conditions you have. What are the risks? Generally, this is a safe procedure. However, problems may occur, including:  A hole (perforation) in the uterus or bowel.  Infection of the uterus, bladder, or vagina.  Bleeding.  Damage to other structures or organs.  An air bubble in the lung (air embolus).  Problems with pregnancy after the procedure.  Failure of the procedure.  Decreased ability to diagnose cancer in the endometrium.  What happens before the procedure?  You will have tests of your endometrium to make sure there are no pre-cancerous cells or cancer cells present.  You may have an ultrasound of the uterus.  You may be given  medicines to thin the endometrium.  Ask your health care provider about: ? Changing or stopping your regular medicines. This is especially important if you take diabetes medicines or blood thinners. ? Taking medicines such as aspirin and ibuprofen. These medicines can thin your blood. Do not take these medicines before your procedure if your doctor tells you not to.  Plan to have someone take you home from the hospital or clinic. What happens during the procedure?  You will lie on an exam table with your feet and legs supported as in a pelvic exam.  To lower your risk of infection: ? Your health care team will wash or sanitize their hands and put on germ-free (sterile) gloves. ? Your genital area will be washed with soap.  An IV tube will be inserted into one of your veins.  You will be given a medicine to help you relax (sedative).  A surgical instrument with a light and camera (resectoscope) will be inserted into your vagina and moved into your uterus. This allows your surgeon to see inside your uterus.  Endometrial tissue will be removed using one of the following methods: ? Radiofrequency. This method uses a radiofrequency-alternating electric current to remove the endometrium. ? Cryotherapy. This method uses extreme cold to freeze the endometrium. ? Heated-free liquid. This method uses a heated saltwater (saline) solution to remove the endometrium. ? Microwave. This method uses high-energy microwaves to heat up the endometrium and remove it. ? Thermal balloon. This method involves inserting a catheter with a balloon tip into the uterus. The balloon tip is   filled with heated fluid to remove the endometrium. The procedure may vary among health care providers and hospitals. What happens after the procedure?  Your blood pressure, heart rate, breathing rate, and blood oxygen level will be monitored until the medicines you were given have worn off.  As tissue healing occurs, you may  notice vaginal bleeding for 4-6 weeks after the procedure. You may also experience: ? Cramps. ? Thin, watery vaginal discharge that is light pink or brown in color. ? A need to urinate more frequently than usual. ? Nausea.  Do not drive for 24 hours if you were given a sedative.  Do not have sex or insert anything into your vagina until your health care provider approves. Summary  Endometrial ablation is done to treat the many causes of heavy menstrual bleeding.  The procedure may be done only after medications have been tried to control the bleeding.  Plan to have someone take you home from the hospital or clinic. This information is not intended to replace advice given to you by your health care provider. Make sure you discuss any questions you have with your health care provider. Document Released: 04/19/2004 Document Revised: 06/27/2016 Document Reviewed: 06/27/2016 Elsevier Interactive Patient Education  2017 Elsevier Inc.  

## 2017-12-08 NOTE — Progress Notes (Signed)
Patient ID: Breanna Miranda, female   DOB: June 05, 1986, 32 y.o.   MRN: [id number]  Chief Complaint  Patient presents with  . Dysmenorrhea    HPI Breanna Miranda is a 32 y.o. female.  [id number] S/p BTL 8 years ago, with menorrhagia and dysmenorrhea requiring transfusion on 2 occasions. She does not want to take OCP and wants a long-term solution. She has f/u at the [facility] this week for a colposcopy. HPI  Past Medical History:  Diagnosis Date  . Anemia   . Anxiety   . Asthma   . Blood transfusion without reported diagnosis 2016  . Chiari I malformation ([place])   . Empty sella ([place])   . Irregular heart rhythm   . Menorrhagia     Past Surgical History:  Procedure Laterality Date  . CHOLECYSTECTOMY    . TUBAL LIGATION      Family History  Problem Relation Age of Onset  . Diabetes Father   . Kidney disease Father        failure due to diabetes  . Hypertension Mother   . Heart attack Maternal Grandmother 63    Social History Social History   Tobacco Use  . Smoking status: Former Smoker    Packs/day: 0.10    Years: 2.00    Pack years: 0.20    Types: Cigarettes    Last attempt to quit: 05/13/2009    Years since quitting: 8.5  . Smokeless tobacco: Never Used  Substance Use Topics  . Alcohol use: Yes    Alcohol/week: 0.6 oz    Types: 1 Glasses of wine per week    Comment: occaional  . Drug use: No    Allergies  Allergen Reactions  . Penicillins Anaphylaxis, Hives and Itching    Has patient had a PCN reaction causing immediate rash, facial/tongue/throat swelling, SOB or lightheadedness with hypotension: Yes Has patient had a PCN reaction causing severe rash involving mucus membranes or skin necrosis: No Has patient had a PCN reaction that required hospitalization No Has patient had a PCN reaction occurring within the last 10 years: Yes If all of the above answers are "NO", then may proceed with Cephalosporin use.   . Shrimp [Shellfish Allergy] Anaphylaxis, Hives  and Itching    Current Outpatient Medications  Medication Sig Dispense Refill  . ferrous sulfate 325 (65 FE) MG tablet Take 1 tablet (325 mg total) by mouth daily with breakfast. 30 tablet 0  . polyethylene glycol (MIRALAX / GLYCOLAX) packet Take 17 g by mouth daily. 14 each 0  . diclofenac (VOLTAREN) 75 MG EC tablet Take 1 tablet (75 mg total) by mouth 2 (two) times daily with a meal. 60 tablet 2  . megestrol (MEGACE) 40 MG tablet Take 1 tablet (40 mg total) by mouth 2 (two) times daily. 30 tablet 3  . ramelteon (ROZEREM) 8 MG tablet Take 1 tablet (8 mg total) by mouth at bedtime. (Patient not taking: Reported on 12/08/2017) 30 tablet 0   No current facility-administered medications for this visit.     Review of Systems Review of Systems  Constitutional: Negative.   Endocrine: Negative.   Genitourinary: Positive for menstrual problem and pelvic pain. Negative for vaginal bleeding and vaginal discharge.    Blood pressure 107/89, pulse 75, weight 187 lb 14.4 oz (85.2 kg), last menstrual period 11/15/2017.  Physical Exam Physical Exam  Constitutional: She appears well-developed. No distress.  Cardiovascular: Normal rate.  Pulmonary/Chest: Effort normal.  Skin: Skin is warm. No pallor.  Psychiatric: She has a normal mood and affect. Her behavior is normal.    Data Reviewed Menorrhagia. Anemia. Followup endometrial thickening on recent ultrasound. LMP 10/28/2017.  EXAM: ULTRASOUND PELVIS TRANSVAGINAL  TECHNIQUE: Transvaginal ultrasound examination of the pelvis was performed including evaluation of the uterus, ovaries, adnexal regions, and pelvic cul-de-sac.  COMPARISON:  09/22/2017  FINDINGS: Uterus  Measurements: 10.4 x 6.3 x 6.9 cm. Diffusely heterogeneous myometrial echotexture. Two distinct fibroids are seen in the posterior fundus measuring 2.0 cm, and the right anterior fundus measuring 1.7 cm, without significant change.  Endometrium  Thickness: 23  mm.  No focal abnormality visualized.  Right ovary  Measurements: 3.8 x 2.0 x 2.9 cm. Normal appearance/no adnexal mass.  Left ovary  Measurements: 4.0 x 2.4 x 2.2 cm. Normal appearance/no adnexal mass.  Other findings:  No abnormal free fluid  IMPRESSION: Persistent diffuse endometrial thickening measuring 23 mm. If bleeding remains unresponsive to hormonal or medical therapy, focal lesion work-up with sonohysterogram should be considered. Endometrial biopsy should also be considered in pre-menopausal patients at high risk for endometrial carcinoma. (Ref: Radiological Reasoning: Algorithmic Workup of Abnormal Vaginal Bleeding with Endovaginal Sonography and Sonohysterography. AJR 2008; 712:W58-09).  Several small uterine fibroids, largest measuring 2.0 cm.  Normal appearance of both ovaries.    Assessment    Menorrhagia and dysmenorrhea, increased endometrial thickness Anemia, taking iron    Plan    Discussed possible endometrial ablation and the need for pap f/u, endometrial biopsy. I gave her information to review. Megace 40 mg BID rx for now. RTC 4 weeks Voltaren to take during menses       [name] 12/08/2017, 2:48 PM

## 2017-12-11 ENCOUNTER — Ambulatory Visit (INDEPENDENT_AMBULATORY_CARE_PROVIDER_SITE_OTHER): Payer: Medicaid Other | Admitting: Family Medicine

## 2017-12-11 ENCOUNTER — Other Ambulatory Visit: Payer: Self-pay

## 2017-12-11 VITALS — BP 104/70 | HR 68 | Temp 98.8°F | Wt 188.0 lb

## 2017-12-11 DIAGNOSIS — R87612 Low grade squamous intraepithelial lesion on cytologic smear of cervix (LGSIL): Secondary | ICD-10-CM

## 2017-12-12 ENCOUNTER — Telehealth: Payer: Self-pay | Admitting: *Deleted

## 2017-12-12 NOTE — Telephone Encounter (Signed)
Received a fax from [facility] that Diclofenac is not covered by patients insurance. They recommend another prescription for alternative to be ordered.

## 2017-12-12 NOTE — Progress Notes (Signed)
Patient returns for repeat colposcopy.  Abnormal Pap LGSIL.  The cervical biopsy and endocervical curettage from last colposcope were insufficient for evaluation.  She is pretty happy about this but understands. Patient given informed consent, signed copy in the chart.  Placed in lithotomy position. Cervix viewed with speculum and colposcope after application of acetic acid.   Colposcopy adequate (entire squamocolumnar junctions seen  in entirety) ?  Yes.  Demarcated well on initial exam there is a large lesion at 12:00 with some darker coloring.  This may or may not be within the transformation zone remain extend the transformation zone. Acetowhite lesions?  Yes 12:00. Punctation?  No Mosaicism?  Yes lesion at 12 o'clock position appears to exhibit some mosaicism Abnormal vasculature?  None noted Biopsies?  3 biopsies were attempted at the 12 o'clock position.  This lesions quite firm and is very difficult to get a sample.  We did manage to get some tissue was sent off. ECC?  By the time we began to perform the ECC, the patient was experiencing quite a bit of discomfort from the 3 tries with getting the cervical biopsy.  I will send a sample but I doubt that has adequacy. Complications?  Patient was pretty anxious.  She had a very small amount of bleeding that was treated with some Monsel's.  She also had some mild cramping.  Assessment: Abnormal Pap with abnormal clinical colposcopic exam.  Given gross findings on colposcopy today, I think she does need evaluation by GYN.  We have discussed this with her, she became quite tearful.  I hope we get some results from the cervical biopsy I attempted today but even without that, I think she needs to seem to be seen by gynecology.  Ultimately she agreed and understands.  COMMENTS:  Patient was given post procedure instructions.  I will notify her of any pathology results.

## 2017-12-17 ENCOUNTER — Encounter: Payer: Self-pay | Admitting: Family Medicine

## 2017-12-22 ENCOUNTER — Telehealth: Payer: Self-pay | Admitting: Family Medicine

## 2017-12-22 NOTE — Telephone Encounter (Signed)
[phone] Jerilynn Mages) [phone] (H)  I spoke with patient. Cervical biopsy as below.   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 Previous pap:   Also had a pelvic US on 12/08/2017:Measurements: 10.4 x 6.3 x 6.9 cm. Diffusely heterogeneous myometrial echotexture. Two distinct fibroids are seen in the posterior fundus measuring 2.0 cm, and the right anterior fundus measuring 1.7 cm, without significant change. Endometrium: Thickness: 23 mm. No focal abnormality visualized  I had sent a request for her to be seen at GYN for the cervical lesion and the clinic informed us they were not taking any new patients with her insurance. As I look through her chart, she is already their patient and actually has a follow up already set for January 15 2018. I will forward all of  this info to gynecologist that is scheduled to see her in July. GYN needs to see her for further management of CERVICAL LESION AS WELL AS DUB.

## 2017-12-24 ENCOUNTER — Other Ambulatory Visit: Payer: Self-pay | Admitting: Obstetrics & Gynecology

## 2017-12-24 DIAGNOSIS — N920 Excessive and frequent menstruation with regular cycle: Secondary | ICD-10-CM

## 2017-12-24 MED ORDER — NAPROXEN SODIUM 550 MG PO TABS: 550.0000 mg | ORAL_TABLET | Freq: Two times a day (BID) | ORAL | 2 refills | Status: DC

## 2017-12-24 NOTE — Telephone Encounter (Signed)
Naproxen sent to her pharmacy

## 2018-01-15 ENCOUNTER — Encounter: Payer: Self-pay | Admitting: Obstetrics & Gynecology

## 2018-01-15 ENCOUNTER — Encounter: Payer: Self-pay | Admitting: Family Medicine

## 2018-01-15 ENCOUNTER — Ambulatory Visit (INDEPENDENT_AMBULATORY_CARE_PROVIDER_SITE_OTHER): Payer: Medicaid Other | Admitting: Obstetrics & Gynecology

## 2018-01-15 DIAGNOSIS — N938 Other specified abnormal uterine and vaginal bleeding: Secondary | ICD-10-CM | POA: Insufficient documentation

## 2018-01-15 DIAGNOSIS — D259 Leiomyoma of uterus, unspecified: Secondary | ICD-10-CM | POA: Insufficient documentation

## 2018-01-15 DIAGNOSIS — R87612 Low grade squamous intraepithelial lesion on cytologic smear of cervix (LGSIL): Secondary | ICD-10-CM

## 2018-01-15 HISTORY — DX: Low grade squamous intraepithelial lesion on cytologic smear of cervix (LGSIL): R87.612

## 2018-01-15 MED ORDER — IBUPROFEN 800 MG PO TABS: 800.0000 mg | ORAL_TABLET | Freq: Three times a day (TID) | ORAL | 1 refills | Status: DC | PRN

## 2018-01-15 NOTE — Patient Instructions (Signed)

## 2018-01-15 NOTE — Progress Notes (Signed)
Pt states she stopped taking the Megace 40mg  tab because she was experiencing increase hunger and gaining weight. Is interested in other options for bleeding.

## 2018-01-15 NOTE — Progress Notes (Signed)
Patient ID: Breanna Miranda, female   DOB: [birth year], 32 y.o.   MRN: [id number]  Cc: vaginal bleeding  HPI Breanna Miranda is a 32 y.o. female.  [id number] Patient's last menstrual period was 01/02/2018 (exact date). She stopped taking Megace due to weight gain. No vaginal bleeding today but bleeding has been irregular and heavy HPI  Past Medical History:  Diagnosis Date  . Anemia   . Anxiety   . Asthma   . Blood transfusion without reported diagnosis 2016  . Chiari I malformation ([place])   . Empty sella ([place])   . Irregular heart rhythm   . Menorrhagia     Past Surgical History:  Procedure Laterality Date  . CHOLECYSTECTOMY    . TUBAL LIGATION      Family History  Problem Relation Age of Onset  . Diabetes Father   . Kidney disease Father        failure due to diabetes  . Hypertension Mother   . Heart attack Maternal Grandmother 63    Social History Social History   Tobacco Use  . Smoking status: Former Smoker    Packs/day: 0.10    Years: 2.00    Pack years: 0.20    Types: Cigarettes    Last attempt to quit: 05/13/2009    Years since quitting: 8.6  . Smokeless tobacco: Never Used  Substance Use Topics  . Alcohol use: Yes    Alcohol/week: 0.6 oz    Types: 1 Glasses of wine per week    Comment: occaional  . Drug use: No    Allergies  Allergen Reactions  . Penicillins Anaphylaxis, Hives and Itching    Has patient had a PCN reaction causing immediate rash, facial/tongue/throat swelling, SOB or lightheadedness with hypotension: Yes Has patient had a PCN reaction causing severe rash involving mucus membranes or skin necrosis: No Has patient had a PCN reaction that required hospitalization No Has patient had a PCN reaction occurring within the last 10 years: Yes If all of the above answers are "NO", then may proceed with Cephalosporin use.   . Shrimp [Shellfish Allergy] Anaphylaxis, Hives and Itching    Current Outpatient Medications  Medication Sig  Dispense Refill  . ferrous sulfate 325 (65 FE) MG tablet Take 1 tablet (325 mg total) by mouth daily with breakfast. 30 tablet 0  . naproxen sodium (ANAPROX) 550 MG tablet Take 1 tablet (550 mg total) by mouth 2 (two) times daily with a meal. 40 tablet 2  . polyethylene glycol (MIRALAX / GLYCOLAX) packet Take 17 g by mouth daily. 14 each 0  . diclofenac (VOLTAREN) 75 MG EC tablet Take 1 tablet (75 mg total) by mouth 2 (two) times daily with a meal. (Patient not taking: Reported on 01/15/2018) 60 tablet 2  . megestrol (MEGACE) 40 MG tablet Take 1 tablet (40 mg total) by mouth 2 (two) times daily. (Patient not taking: Reported on 01/15/2018) 30 tablet 3  . ramelteon (ROZEREM) 8 MG tablet Take 1 tablet (8 mg total) by mouth at bedtime. (Patient not taking: Reported on 12/08/2017) 30 tablet 0   No current facility-administered medications for this visit.     Review of Systems Review of Systems  Constitutional: Negative.   Respiratory: Negative.   Gastrointestinal: Negative.   Genitourinary: Positive for menstrual problem, pelvic pain and vaginal bleeding. Negative for vaginal discharge.    Blood pressure 121/76, pulse 74, weight 193 lb (87.5 kg), last menstrual period 01/02/2018.  Physical Exam Physical Exam  Constitutional:  She appears well-developed. No distress.  HENT:  Head: Normocephalic.  Neck: Normal range of motion.  Pulmonary/Chest: Effort normal.  Abdominal: She exhibits no distension.  Neurological: She is alert.  Skin: Skin is warm.  Psychiatric: She has a normal mood and affect.  Vitals reviewed.   Data Reviewed TRANSABDOMINAL AND TRANSVAGINAL ULTRASOUND OF PELVIS  TECHNIQUE: Study was performed transabdominally to optimize pelvic field of view evaluation and transvaginally to optimize internal visceral architecture evaluation.  COMPARISON:  None  FINDINGS: Uterus  Measurements: 9.5 x 6.4 x 6.7 cm. There is a hypoechoic mass near the superior aspect of the  endometrium measuring 1.8 x 1.6 x 2.0 cm. A second small hypoechoic mass in the superior fundal region measures 1.4 x 1.4 x 1.7 cm. These masses are felt to represent small leiomyomas.  Endometrium  Thickness: 21 mm, abnormally thickened. Endometrial contour is smooth.  Right ovary  Measurements: 3.5 x 2.4 x 2.5 cm. There is a dominant follicle arising from the right ovary measuring 2.1 x 1.8 x 1.9 cm. No other extrauterine pelvic mass.  Left ovary  Measurements: 3.2 x 2.2 x 3.0 cm. Normal appearance/no adnexal mass.  Other findings  No abnormal free fluid.  IMPRESSION: 1. Diffuse endometrial thickening. Endometrial thickness is considered abnormal. Advise follow-up by [place] in 6-8 weeks, during the week immediately following menses (exam timing is critical). Given the degree of anemia, gynecologic consultation may well be warranted.  2.  Small leiomyomas in the superior uterus.  3. No extrauterine pelvic mass beyond a dominant physiologic follicle on the right. No free pelvic fluid.   Electronically Signed   By: Lowella Grip III M.D.   On: 09/22/2017 07:29   Assessment    DUB without good control using progestin or OCP.     Plan    I offered Liletta vs ablation or TVH. She would prefer non surgical management and will read literature and return for IUD       [name] 01/15/2018, 12:06 PM

## 2018-01-27 ENCOUNTER — Ambulatory Visit: Payer: Medicaid Other | Admitting: Family Medicine

## 2018-02-02 ENCOUNTER — Ambulatory Visit: Payer: Medicaid Other | Admitting: Obstetrics & Gynecology

## 2018-02-11 ENCOUNTER — Encounter: Payer: Self-pay | Admitting: Family Medicine

## 2018-02-18 ENCOUNTER — Ambulatory Visit: Payer: Medicaid Other | Admitting: Family Medicine

## 2018-05-03 ENCOUNTER — Encounter (HOSPITAL_COMMUNITY): Payer: Self-pay | Admitting: Emergency Medicine

## 2018-05-03 ENCOUNTER — Ambulatory Visit (HOSPITAL_COMMUNITY)
Admission: EM | Admit: 2018-05-03 | Discharge: 2018-05-03 | Disposition: A | Discharge status: Home / Self Care | Payer: Medicaid Other | Attending: Family Medicine | Admitting: Family Medicine

## 2018-05-03 DIAGNOSIS — B9789 Other viral agents as the cause of diseases classified elsewhere: Secondary | ICD-10-CM | POA: Diagnosis not present

## 2018-05-03 DIAGNOSIS — J069 Acute upper respiratory infection, unspecified: Secondary | ICD-10-CM

## 2018-05-03 MED ORDER — BENZONATATE 100 MG PO CAPS: 100.0000 mg | ORAL_CAPSULE | Freq: Three times a day (TID) | ORAL | 0 refills | Status: DC

## 2018-05-03 MED ORDER — CETIRIZINE-PSEUDOEPHEDRINE ER 5-120 MG PO TB12: 1.0000 | ORAL_TABLET | Freq: Every day | ORAL | 0 refills | Status: DC

## 2018-05-03 MED ORDER — ALBUTEROL SULFATE HFA 108 (90 BASE) MCG/ACT IN AERS
2.0000 | INHALATION_SPRAY | Freq: Once | RESPIRATORY_TRACT | Status: AC
  Administered 2018-05-03: 2 via RESPIRATORY_TRACT

## 2018-05-03 MED ORDER — FLUTICASONE PROPIONATE 50 MCG/ACT NA SUSP: 2.0000 | Freq: Every day | NASAL | 0 refills | Status: DC

## 2018-05-03 MED ORDER — ALBUTEROL SULFATE HFA 108 (90 BASE) MCG/ACT IN AERS
INHALATION_SPRAY | RESPIRATORY_TRACT | Status: AC
  Filled 2018-05-03: qty 6.7

## 2018-05-03 NOTE — Discharge Instructions (Signed)
Inhaler given in office.  Use as needed for shortness of breath and wheezing Get plenty of rest and push fluids Tessalon Perles prescribed for cough Zyrtec-D prescribed for nasal congestion, runny nose, and/or sore throat Flonase prescribed for nasal congestion and runny nose Use OTC medications like ibuprofen or tylenol as needed fever or pain Follow up with PCP if symptoms persist Return or go to ER if you have any new or worsening symptoms fever, chills, nausea, vomiting, chest pain, cough, shortness of breath, wheezing, abdominal pain, changes in bowel or bladder habits, etc..Marland Kitchen

## 2018-05-03 NOTE — ED Triage Notes (Signed)
Pt here for URI sx with cough and body aches

## 2018-05-03 NOTE — ED Provider Notes (Signed)
[facility]   [id number] 05/03/18 Arrival Time: 0102   CC: URI symptoms   SUBJECTIVE: History from: patient.  Breanna Miranda is a 32 y.o. female who presents with abrupt onset of nasal congestion, runny nose, sinus pressure, PND, and productive cough x 3 days.  Denies positive sick exposure or precipitating event.  Has tried OTC medications and throat lozenges without relief.  Symptoms are made worse with lying down at night.  Denies previous symptoms in the past. Complains of subjective fever, chills, body aches, and wheezing.  Denies sore throat, chest pain, nausea, changes in bowel or bladder habits.     Received flu shot this year: no.  Denies hx of asthma or tobacco use.   ROS: As per HPI.  Past Medical History:  Diagnosis Date  . Anemia   . Anxiety   . Asthma   . Blood transfusion without reported diagnosis 2016  . Chiari I malformation ([place])   . Empty sella ([place])   . Irregular heart rhythm   . Menorrhagia    Past Surgical History:  Procedure Laterality Date  . CHOLECYSTECTOMY    . TUBAL LIGATION     Allergies  Allergen Reactions  . Penicillins Anaphylaxis, Hives and Itching    Has patient had a PCN reaction causing immediate rash, facial/tongue/throat swelling, SOB or lightheadedness with hypotension: Yes Has patient had a PCN reaction causing severe rash involving mucus membranes or skin necrosis: No Has patient had a PCN reaction that required hospitalization No Has patient had a PCN reaction occurring within the last 10 years: Yes If all of the above answers are "NO", then may proceed with Cephalosporin use.   . Shrimp [Shellfish Allergy] Anaphylaxis, Hives and Itching   No current facility-administered medications on file prior to encounter.    Current Outpatient Medications on File Prior to Encounter  Medication Sig Dispense Refill  . ferrous sulfate 325 (65 FE) MG tablet Take 1 tablet (325 mg total) by mouth daily with breakfast. 30  tablet 0  . ibuprofen (ADVIL,MOTRIN) 800 MG tablet Take 1 tablet (800 mg total) by mouth every 8 (eight) hours as needed for moderate pain. 40 tablet 1  . naproxen sodium (ANAPROX) 550 MG tablet Take 1 tablet (550 mg total) by mouth 2 (two) times daily with a meal. 40 tablet 2  . polyethylene glycol (MIRALAX / GLYCOLAX) packet Take 17 g by mouth daily. 14 each 0   Social History   Socioeconomic History  . Marital status: Married    Spouse name: Not on file  . Number of children: Not on file  . Years of education: Not on file  . Highest education level: Not on file  Occupational History  . Not on file  Social Needs  . Financial resource strain: Not on file  . Food insecurity:    Worry: Not on file    Inability: Not on file  . Transportation needs:    Medical: Not on file    Non-medical: Not on file  Tobacco Use  . Smoking status: Former Smoker    Packs/day: 0.10    Years: 2.00    Pack years: 0.20    Types: Cigarettes    Last attempt to quit: 05/13/2009    Years since quitting: 8.9  . Smokeless tobacco: Never Used  Substance and Sexual Activity  . Alcohol use: Yes    Alcohol/week: 1.0 standard drinks    Types: 1 Glasses of wine per week    Comment: occaional  .  Drug use: No  . Sexual activity: Yes    Birth control/protection: Surgical  Lifestyle  . Physical activity:    Days per week: Not on file    Minutes per session: Not on file  . Stress: Not on file  Relationships  . Social connections:    Talks on phone: Not on file    Gets together: Not on file    Attends religious service: Not on file    Active member of club or organization: Not on file    Attends meetings of clubs or organizations: Not on file    Relationship status: Not on file  . Intimate partner violence:    Fear of current or ex partner: Not on file    Emotionally abused: Not on file    Physically abused: Not on file    Forced sexual activity: Not on file  Other Topics Concern  . Not on file    Social History Narrative  . Not on file   Family History  Problem Relation Age of Onset  . Diabetes Father   . Kidney disease Father        failure due to diabetes  . Hypertension Mother   . Heart attack Maternal Grandmother 63    OBJECTIVE:  Vitals:   05/03/18 1639  BP: (!) 143/67  Pulse: 91  Resp: 18  Temp: 98.5 F (36.9 C)  TempSrc: Oral  SpO2: 100%     General appearance: alert; appears fatigued, but nontoxic; speaking in full sentences and tolerating own secretions HEENT: NCAT; Ears: EACs clear, TMs pearly gray; Eyes: PERRL.  EOM grossly intact. Nose: nares patent without mild rhinorrhea, Throat: oropharynx clear, tonsils non erythematous or enlarged, uvula midline  Neck: supple without LAD Lungs: unlabored respirations, symmetrical air entry; cough: moderate; no respiratory distress; CTAB Heart: regular rate and rhythm.  Radial pulses 2+ symmetrical bilaterally Skin: warm and dry Psychological: alert and cooperative; normal mood and affect  ASSESSMENT & PLAN:  1. Viral URI with cough     Meds ordered this encounter  Medications  . albuterol (PROVENTIL HFA;VENTOLIN HFA) 108 (90 Base) MCG/ACT inhaler 2 puff  . cetirizine-pseudoephedrine (ZYRTEC-D) 5-120 MG tablet    Sig: Take 1 tablet by mouth daily.    Dispense:  30 tablet    Refill:  0    Order Specific Question:   Supervising Provider    Answer:   Wynona Luna [phone]  . benzonatate (TESSALON) 100 MG capsule    Sig: Take 1 capsule (100 mg total) by mouth every 8 (eight) hours.    Dispense:  21 capsule    Refill:  0    Order Specific Question:   Supervising Provider    Answer:   Wynona Luna [phone]  . fluticasone (FLONASE) 50 MCG/ACT nasal spray    Sig: Place 2 sprays into both nostrils daily.    Dispense:  16 g    Refill:  0    Order Specific Question:   Supervising Provider    Answer:   Wynona Luna [[id number]]   Inhaler given in office.  Use as needed for shortness of  breath and wheezing Get plenty of rest and push fluids Tessalon Perles prescribed for cough Zyrtec-D prescribed for nasal congestion, runny nose, and/or sore throat Flonase prescribed for nasal congestion and runny nose Use OTC medications like ibuprofen or tylenol as needed fever or pain Follow up with PCP if symptoms persist Return or go to ER if you have any  new or worsening symptoms fever, chills, nausea, vomiting, chest pain, cough, shortness of breath, wheezing, abdominal pain, changes in bowel or bladder habits, etc...  Reviewed expectations re: course of current medical issues. Questions answered. Outlined signs and symptoms indicating need for more acute intervention. Patient verbalized understanding. After Visit Summary given.         Lestine Box, PA-C 05/03/18 1721

## 2018-05-06 ENCOUNTER — Encounter (HOSPITAL_COMMUNITY): Payer: Self-pay

## 2018-05-06 ENCOUNTER — Other Ambulatory Visit: Payer: Self-pay

## 2018-05-06 ENCOUNTER — Ambulatory Visit (INDEPENDENT_AMBULATORY_CARE_PROVIDER_SITE_OTHER): Payer: Medicaid Other

## 2018-05-06 ENCOUNTER — Ambulatory Visit (HOSPITAL_COMMUNITY)
Admission: EM | Admit: 2018-05-06 | Discharge: 2018-05-06 | Disposition: A | Discharge status: Home / Self Care | Payer: Medicaid Other | Attending: Family Medicine | Admitting: Family Medicine

## 2018-05-06 DIAGNOSIS — J0100 Acute maxillary sinusitis, unspecified: Secondary | ICD-10-CM

## 2018-05-06 DIAGNOSIS — R05 Cough: Secondary | ICD-10-CM

## 2018-05-06 DIAGNOSIS — R059 Cough, unspecified: Secondary | ICD-10-CM

## 2018-05-06 MED ORDER — DOXYCYCLINE HYCLATE 100 MG PO CAPS: 100.0000 mg | ORAL_CAPSULE | Freq: Two times a day (BID) | ORAL | 0 refills | Status: DC

## 2018-05-06 NOTE — ED Provider Notes (Signed)
[facility]   [id number] 05/06/18 Arrival Time: 4098  Cc: COUGH  SUBJECTIVE:  Breanna Miranda is a 32 y.o. female who presents with abrupt onset of nasal congestion, runny nose, sinus pressure, and worsening productive cough x 6 days.  Patient was seen 3 days ago and treated for viral URI with zyrtec, tessalon and inhaler without relief.  Cough is persistent and productive with yellow/ green sputum.  Denies positive sick exposure or precipitating event.  Symptoms are made worse with lying down at night.  Denies previous symptoms in the past. Complains of subjective fever, chills, body aches, and wheezing.  Denies sore throat, chest pain, nausea, changes in bowel or bladder habits.    ROS: As per HPI.  Past Medical History:  Diagnosis Date  . Anemia   . Anxiety   . Asthma   . Blood transfusion without reported diagnosis 2016  . Chiari I malformation ([place])   . Empty sella ([place])   . Irregular heart rhythm   . Menorrhagia    Past Surgical History:  Procedure Laterality Date  . CHOLECYSTECTOMY    . TUBAL LIGATION     Allergies  Allergen Reactions  . Penicillins Anaphylaxis, Hives and Itching    Has patient had a PCN reaction causing immediate rash, facial/tongue/throat swelling, SOB or lightheadedness with hypotension: Yes Has patient had a PCN reaction causing severe rash involving mucus membranes or skin necrosis: No Has patient had a PCN reaction that required hospitalization No Has patient had a PCN reaction occurring within the last 10 years: Yes If all of the above answers are "NO", then may proceed with Cephalosporin use.   . Shrimp [Shellfish Allergy] Anaphylaxis, Hives and Itching   No current facility-administered medications on file prior to encounter.    Current Outpatient Medications on File Prior to Encounter  Medication Sig Dispense Refill  . benzonatate (TESSALON) 100 MG capsule Take 1 capsule (100 mg total) by mouth every 8 (eight) hours. 21  capsule 0  . cetirizine-pseudoephedrine (ZYRTEC-D) 5-120 MG tablet Take 1 tablet by mouth daily. 30 tablet 0  . ferrous sulfate 325 (65 FE) MG tablet Take 1 tablet (325 mg total) by mouth daily with breakfast. 30 tablet 0  . fluticasone (FLONASE) 50 MCG/ACT nasal spray Place 2 sprays into both nostrils daily. 16 g 0  . ibuprofen (ADVIL,MOTRIN) 800 MG tablet Take 1 tablet (800 mg total) by mouth every 8 (eight) hours as needed for moderate pain. 40 tablet 1  . naproxen sodium (ANAPROX) 550 MG tablet Take 1 tablet (550 mg total) by mouth 2 (two) times daily with a meal. 40 tablet 2  . polyethylene glycol (MIRALAX / GLYCOLAX) packet Take 17 g by mouth daily. 14 each 0    Social History   Socioeconomic History  . Marital status: Married    Spouse name: Not on file  . Number of children: Not on file  . Years of education: Not on file  . Highest education level: Not on file  Occupational History  . Not on file  Social Needs  . Financial resource strain: Not on file  . Food insecurity:    Worry: Not on file    Inability: Not on file  . Transportation needs:    Medical: Not on file    Non-medical: Not on file  Tobacco Use  . Smoking status: Former Smoker    Packs/day: 0.10    Years: 2.00    Pack years: 0.20    Types: Cigarettes  Last attempt to quit: 05/13/2009    Years since quitting: 8.9  . Smokeless tobacco: Never Used  Substance and Sexual Activity  . Alcohol use: Yes    Alcohol/week: 1.0 standard drinks    Types: 1 Glasses of wine per week    Comment: occaional  . Drug use: No  . Sexual activity: Yes    Birth control/protection: Surgical  Lifestyle  . Physical activity:    Days per week: Not on file    Minutes per session: Not on file  . Stress: Not on file  Relationships  . Social connections:    Talks on phone: Not on file    Gets together: Not on file    Attends religious service: Not on file    Active member of club or organization: Not on file    Attends  meetings of clubs or organizations: Not on file    Relationship status: Not on file  . Intimate partner violence:    Fear of current or ex partner: Not on file    Emotionally abused: Not on file    Physically abused: Not on file    Forced sexual activity: Not on file  Other Topics Concern  . Not on file  Social History Narrative  . Not on file   Family History  Problem Relation Age of Onset  . Diabetes Father   . Kidney disease Father        failure due to diabetes  . Hypertension Mother   . Heart attack Maternal Grandmother 63     OBJECTIVE:  Vitals:   05/06/18 1654 05/06/18 1656  BP:  126/63  Pulse:  (!) 109  Resp:  18  Temp:  98.3 F (36.8 C)  TempSrc:  Oral  SpO2:  99%  Weight: 190 lb (86.2 kg)      General appearance: Alert, appears fatigued, but nontoxic; speaking in full sentences without difficulty HEENT:NCAT; Ears: EACs clear, TMs pearly gray; Eyes: PERRL.  EOM grossly intact. Sinus: Maxillary sinus tenderness; Nose: nares erythematous with mild rhinorrhea; Throat: tonsils nonerythematous or enlarged, uvula midline  Neck: supple without LAD Lungs: clear to auscultation bilaterally without adventitious breath sounds; normal respiratory effort; persistent cough present Heart: regular rate and rhythm.  Radial pulses 2+ symmetrical bilaterally Skin: warm and dry Psychological: alert and cooperative; normal mood and affect  DIAGNOSTIC STUDIES:  Dg Chest 2 View  Result Date: 05/06/2018 CLINICAL DATA:  32 year old female with cough and high fever EXAM: CHEST - 2 VIEW COMPARISON:  Prior chest x-ray 08/15/2016 FINDINGS: The lungs are clear and negative for focal airspace consolidation, pulmonary edema or suspicious pulmonary nodule. No pleural effusion or pneumothorax. Cardiac and mediastinal contours are within normal limits. No acute fracture or lytic or blastic osseous lesions. The visualized upper abdominal bowel gas pattern is unremarkable. Surgical clips in the  right upper quadrant consistent with prior cholecystectomy. IMPRESSION: Normal chest x-ray. Electronically Signed   By: Jacqulynn Cadet M.D.   On: 05/06/2018 18:12     ASSESSMENT & PLAN:  1. Acute non-recurrent maxillary sinusitis   2. Cough     Meds ordered this encounter  Medications  . doxycycline (VIBRAMYCIN) 100 MG capsule    Sig: Take 1 capsule (100 mg total) by mouth 2 (two) times daily.    Dispense:  20 capsule    Refill:  0    Order Specific Question:   Supervising Provider    Answer:   Wynona Luna [[id number]]    Orders  Placed This Encounter  Procedures  . DG Chest 2 View    Standing Status:   Standing    Number of Occurrences:   1    Order Specific Question:   Reason for Exam (SYMPTOM  OR DIAGNOSIS REQUIRED)    Answer:   worsening cough    Chest x-ray did not show pneumonia or bronchitis Symptoms most likely related to sinus infection Get plenty of rest and push fluids Take antibiotic as directed and to completion Continue with prescribed medications as directed.   Return or go to ER if you have any new or worsening symptoms such as fever, chills, fatigue, shortness of breath, wheezing, chest pain, nausea, changes in bowel or bladder habits, etc...  Reviewed expectations re: course of current medical issues. Questions answered. Outlined signs and symptoms indicating need for more acute intervention. Patient verbalized understanding. After Visit Summary given.          Lestine Box, PA-C 05/06/18 8338

## 2018-05-06 NOTE — Discharge Instructions (Signed)
Chest x-ray did not show pneumonia or bronchitis Get plenty of rest and push fluids Take antibiotic as directed and to completion Continue with prescribed medications as directed.   Return or go to ER if you have any new or worsening symptoms such as fever, chills, fatigue, shortness of breath, wheezing, chest pain, nausea, changes in bowel or bladder habits, etc..Marland Kitchen

## 2018-05-06 NOTE — ED Triage Notes (Signed)
Pt cc she was here This past Sunday and she states she has gotten worst. Body ache and cough and chills.

## 2018-05-12 ENCOUNTER — Other Ambulatory Visit: Payer: Self-pay

## 2018-05-12 ENCOUNTER — Ambulatory Visit (INDEPENDENT_AMBULATORY_CARE_PROVIDER_SITE_OTHER): Payer: Medicaid Other | Admitting: Family Medicine

## 2018-05-12 ENCOUNTER — Encounter: Payer: Self-pay | Admitting: Family Medicine

## 2018-05-12 VITALS — BP 110/62 | HR 100 | Temp 98.1°F | Wt 198.0 lb

## 2018-05-12 DIAGNOSIS — R202 Paresthesia of skin: Secondary | ICD-10-CM | POA: Diagnosis not present

## 2018-05-12 DIAGNOSIS — R7309 Other abnormal glucose: Secondary | ICD-10-CM

## 2018-05-12 DIAGNOSIS — R05 Cough: Secondary | ICD-10-CM | POA: Diagnosis not present

## 2018-05-12 DIAGNOSIS — R059 Cough, unspecified: Secondary | ICD-10-CM

## 2018-05-12 DIAGNOSIS — E8889 Other specified metabolic disorders: Secondary | ICD-10-CM

## 2018-05-12 LAB — POCT GLYCOSYLATED HEMOGLOBIN (HGB A1C): Hemoglobin A1C: 5.4 % (ref 4.0–5.6)

## 2018-05-12 MED ORDER — MONTELUKAST SODIUM 10 MG PO TABS: 10.0000 mg | ORAL_TABLET | Freq: Every day | ORAL | 0 refills | Status: DC

## 2018-05-12 MED ORDER — HYDROCOD POLST-CPM POLST ER 10-8 MG/5ML PO SUER: 5.0000 mL | Freq: Every evening | ORAL | 0 refills | Status: DC | PRN

## 2018-05-12 NOTE — Progress Notes (Addendum)
Subjective:     Patient ID: Breanna Miranda, female   DOB: [birth year], 32 y.o.   MRN: [id number]  Cough  This is a new problem. The current episode started 1 to 4 weeks ago (2 weeks). The problem has been gradually worsening. The problem occurs constantly. The cough is non-productive (Initially she was producing sputum but no more). Associated symptoms include rhinorrhea, shortness of breath and wheezing. Pertinent negatives include no fever. The symptoms are aggravated by lying down (worse at night). Risk factors: No sick contact. She has tried OTC cough suppressant (Albuterol, Doxycycline, Tessalon) for the symptoms. Her past medical history is significant for asthma.   Pain: C/O burning and tingling sensation which initially started from her left hip and now on her left foot mostly on the ventral surface. This has been ongoing for 2 months. She is uncertain how it started. She will like to get it checked.  Current Outpatient Medications on File Prior to Visit  Medication Sig Dispense Refill  . benzonatate (TESSALON) 100 MG capsule Take 1 capsule (100 mg total) by mouth every 8 (eight) hours. 21 capsule 0  . cetirizine-pseudoephedrine (ZYRTEC-D) 5-120 MG tablet Take 1 tablet by mouth daily. 30 tablet 0  . doxycycline (VIBRAMYCIN) 100 MG capsule Take 1 capsule (100 mg total) by mouth 2 (two) times daily. 20 capsule 0  . ferrous sulfate 325 (65 FE) MG tablet Take 1 tablet (325 mg total) by mouth daily with breakfast. 30 tablet 0  . fluticasone (FLONASE) 50 MCG/ACT nasal spray Place 2 sprays into both nostrils daily. 16 g 0  . ibuprofen (ADVIL,MOTRIN) 800 MG tablet Take 1 tablet (800 mg total) by mouth every 8 (eight) hours as needed for moderate pain. 40 tablet 1  . naproxen sodium (ANAPROX) 550 MG tablet Take 1 tablet (550 mg total) by mouth 2 (two) times daily with a meal. 40 tablet 2  . polyethylene glycol (MIRALAX / GLYCOLAX) packet Take 17 g by mouth daily. 14 each 0   No current  facility-administered medications on file prior to visit.    Past Medical History:  Diagnosis Date  . Anemia   . Anxiety   . Asthma   . Blood transfusion without reported diagnosis 2016  . Chiari I malformation ([place])   . Empty sella ([place])   . Irregular heart rhythm   . Menorrhagia    Vitals:   05/12/18 1114 05/12/18 1133  BP: 110/62   Pulse:  100  Temp: 98.1 F (36.7 C)   TempSrc: Oral   SpO2:  99%  Weight: 198 lb (89.8 kg)      Review of Systems  Constitutional: Negative for fever.  HENT: Positive for rhinorrhea.   Respiratory: Positive for cough, shortness of breath and wheezing.   Cardiovascular: Negative.   Gastrointestinal: Negative.   Neurological: Negative for seizures, weakness and numbness.  Psychiatric/Behavioral: Negative.   All other systems reviewed and are negative.      Objective:   Physical Exam  Constitutional: She is oriented to person, place, and time. She appears well-developed.  HENT:  Head: Normocephalic.  Right Ear: External ear normal.  Left Ear: External ear normal.  Mouth/Throat: Oropharynx is clear and moist. No oropharyngeal exudate.  Eyes: Pupils are equal, round, and reactive to light. Conjunctivae are normal.  Neck: Normal range of motion.  Cardiovascular: Normal rate, regular rhythm and normal heart sounds.  No murmur heard. Pulmonary/Chest: Effort normal and breath sounds normal. No stridor. No respiratory distress. She has no  wheezes.  Abdominal: Soft. Bowel sounds are normal. She exhibits no distension. There is no tenderness.  Musculoskeletal: Normal range of motion. She exhibits no edema, tenderness or deformity.  Normal gait  Lymphadenopathy:    She has no cervical adenopathy.  Neurological: She is alert and oriented to person, place, and time. No cranial nerve deficit or sensory deficit.  Nursing note and vitals reviewed.     Assessment:     Cough Paresthesia    Plan:     Cough: ?? Viral with allergy or  underlying asthma. Recent chest xray reviewed and was normal. She is on Doxycycline for potential sinus infection and uses Tessalon prn cough. Tussionex prescribed prn cough and was advised not to use when operating. Singulair QHS for allergy. Albuterol prn SOB and wheezing. She was also prescribed albuterol at her most recent ED visit, although not on file. ED precaution discussed. F?U with PCP soon.   Paresthesia. Etiology unclear. A1C neg for DM. Check TSH and Vit B12. As discussed with her, she will benefit from NSC/EMG in the future. I will defer to her PCP. F/U with PCP recommended in 1-2 weeks. She agreed with the plan.

## 2018-05-12 NOTE — Patient Instructions (Signed)
Paresthesia Paresthesia is a burning or prickling feeling. This feeling can happen in any part of the body. It often happens in the hands, arms, legs, or feet. Usually, it is not painful. In most cases, the feeling goes away in a short time and is not a sign of a serious problem. Follow these instructions at home:  Avoid drinking alcohol.  Try massage or needle therapy (acupuncture) to help with your problems.  Keep all follow-up visits as told by your doctor. This is important. Contact a doctor if:  You keep on having episodes of paresthesia.  Your burning or prickling feeling gets worse when you walk.  You have pain or cramps.  You feel dizzy.  You have a rash. Get help right away if:  You feel weak.  You have trouble walking or moving.  You have problems speaking, understanding, or seeing.  You feel confused.  You cannot control when you pee (urinate) or poop (bowel movement).  You lose feeling (numbness) after an injury.  You pass out (faint). This information is not intended to replace advice given to you by your health care provider. Make sure you discuss any questions you have with your health care provider. Document Released: 05/23/2008 Document Revised: 11/16/2015 Document Reviewed: 06/06/2014 Elsevier Interactive Patient Education  2018 [employer]  

## 2018-05-13 ENCOUNTER — Other Ambulatory Visit: Payer: Self-pay | Admitting: Family Medicine

## 2018-05-13 DIAGNOSIS — R202 Paresthesia of skin: Secondary | ICD-10-CM

## 2018-05-13 NOTE — Addendum Note (Signed)
Addended by: [name] T on: 05/13/2018 10:34 AM   Modules accepted: Orders

## 2018-05-14 ENCOUNTER — Telehealth: Payer: Self-pay

## 2018-05-14 LAB — SPECIMEN STATUS REPORT

## 2018-05-14 LAB — VITAMIN B12: Vitamin B-12: 679 pg/mL (ref 232–1245)

## 2018-05-14 NOTE — Telephone Encounter (Signed)
Spoke with pt and informed of normal lab results thus far. Pt did say she is still having the sxs and would like to be referred to neurologist if possible. Pt can not get off of work again, is hoping to have this sent.  Will forward to pcp.

## 2018-05-14 NOTE — Telephone Encounter (Signed)
-----   Message from Kinnie Feil, MD sent at 05/14/2018  3:42 PM EST ----- Please inform patient that lab test has been normal so far. F/U with PCP soon if still having burning or numbing pain of her feet so she can be referred to the neurologist if appropriate.

## 2018-05-15 ENCOUNTER — Other Ambulatory Visit: Payer: Self-pay | Admitting: Family Medicine

## 2018-05-15 DIAGNOSIS — M79609 Pain in unspecified limb: Secondary | ICD-10-CM

## 2018-05-15 DIAGNOSIS — R202 Paresthesia of skin: Principal | ICD-10-CM

## 2018-05-15 LAB — VITAMIN B1: THIAMINE: 86.1 nmol/L (ref 66.5–200.0)

## 2018-05-15 LAB — TSH: TSH: 0.932 u[IU]/mL (ref 0.450–4.500)

## 2018-05-15 NOTE — Progress Notes (Signed)
Placing amb referral to neurology for paresthesia/pain of foot as noted on patient's last clinic visit from review of attending's notes.  -Dr. Criss Rosales

## 2018-05-15 NOTE — Telephone Encounter (Signed)
  Pt informed that referral has been placed per Dr. Criss Rosales. Katharina Caper, Manan Olmo D, CMA              Sherene Sires, DO  [facility] Rn Team 1 hour ago (7:33 AM)    Referral placed   Routing comment

## 2018-06-02 ENCOUNTER — Encounter: Payer: Self-pay | Admitting: Neurology

## 2018-07-13 ENCOUNTER — Other Ambulatory Visit: Payer: Self-pay

## 2018-07-13 ENCOUNTER — Inpatient Hospital Stay (HOSPITAL_COMMUNITY)
Admission: AD | Admit: 2018-07-13 | Discharge: 2018-07-13 | Discharge status: Against Medical Advice | Payer: Medicaid Other | Attending: Obstetrics and Gynecology | Admitting: Obstetrics and Gynecology

## 2018-07-13 DIAGNOSIS — Z5321 Procedure and treatment not carried out due to patient leaving prior to being seen by health care provider: Secondary | ICD-10-CM | POA: Insufficient documentation

## 2018-07-13 DIAGNOSIS — N898 Other specified noninflammatory disorders of vagina: Secondary | ICD-10-CM | POA: Diagnosis not present

## 2018-07-13 LAB — WET PREP, GENITAL
CLUE CELLS WET PREP: NONE SEEN
SPERM: NONE SEEN
TRICH WET PREP: NONE SEEN
Yeast Wet Prep HPF POC: NONE SEEN

## 2018-07-13 NOTE — MAU Note (Signed)
Not in lobby

## 2018-07-13 NOTE — MAU Note (Signed)
Thinks she has a yeast infection.  Was on antibiotics for a tooth.  Now has thick, white, cottage cheese like d/c and vag itching.

## 2018-07-13 NOTE — MAU Note (Signed)
Not in lobby #3 

## 2018-07-14 LAB — GC/CHLAMYDIA PROBE AMP (~~LOC~~) NOT AT ARMC
Chlamydia: NEGATIVE
NEISSERIA GONORRHEA: NEGATIVE

## 2018-08-08 NOTE — Progress Notes (Deleted)
[facility] Clinic Note - Initial Visit   Date: 08/08/18  Breanna Miranda MRN: [id number] DOB: 29-Jul-1985   Dear Dr. Parke Simmers:  Thank you for your kind referral of Breanna Miranda for consultation of left leg pain. Although her history is well known to you, please allow [place] to reiterate it for the purpose of our medical record. The patient was accompanied to the clinic by *** who also provides collateral information.     History of Present Illness: Breanna Miranda is a 33 y.o. ***-handed Caucasian/*** female with *** presenting for evaluation of left leg pain.    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 This could contribute to episodic syncope. Neurosurgical consultation may be warranted. No acute infarction is evident.  No postictal sequelae are seen. Abnormal bone marrow signal correlating with patient's severe  anemia. Lab Results  Component Value Date   TSH 0.932 05/12/2018   Lab Results  Component Value Date   VITAMINB12 679 05/12/2018   Lab Results  Component Value Date   HGBA1C 5.4 05/12/2018     Past Medical History:  Diagnosis Date  . Anemia   . Anxiety   . Asthma   . Blood transfusion without reported diagnosis 2016  . Chiari I malformation ([facility])   . Empty sella ([facility])   . Irregular heart rhythm   . Menorrhagia     Past Surgical History:  Procedure Laterality Date  . CHOLECYSTECTOMY    . TUBAL LIGATION       Medications:  Outpatient Encounter Medications as of 08/12/2018  Medication Sig  . benzonatate (TESSALON) 100 MG capsule Take 1 capsule (100 mg total) by mouth every 8 (eight) hours.  . cetirizine-pseudoephedrine (ZYRTEC-D) 5-120 MG tablet Take 1 tablet by mouth daily.  . chlorpheniramine-HYDROcodone (TUSSIONEX PENNKINETIC ER) 10-8 MG/5ML  SUER Take 5 mLs by mouth at bedtime as needed for cough.  . doxycycline (VIBRAMYCIN) 100 MG capsule Take 1 capsule (100 mg total) by mouth 2 (two) times daily.  . ferrous sulfate 325 (65 FE) MG tablet Take 1 tablet (325 mg total) by mouth daily with breakfast.  . fluticasone (FLONASE) 50 MCG/ACT nasal spray Place 2 sprays into both nostrils daily.  Marland Kitchen ibuprofen (ADVIL,MOTRIN) 800 MG tablet Take 1 tablet (800 mg total) by mouth every 8 (eight) hours as needed for moderate pain.  . montelukast (SINGULAIR) 10 MG tablet Take 1 tablet (10 mg total) by mouth at bedtime.  . naproxen sodium (ANAPROX) 550 MG tablet Take 1 tablet (550 mg total) by mouth 2 (two) times daily with a meal.  . polyethylene glycol (MIRALAX / GLYCOLAX) packet Take 17 g by mouth daily.   No facility-administered encounter medications on file as of 08/12/2018.     Allergies:  Allergies  Allergen Reactions  . Penicillins Anaphylaxis, Hives and Itching    Has patient had a PCN reaction causing immediate rash, facial/tongue/throat swelling, SOB or lightheadedness with hypotension: Yes Has patient had a PCN reaction causing severe rash involving mucus membranes or skin necrosis: No Has patient had a PCN reaction that required hospitalization No Has patient had a PCN reaction occurring within the last 10 years: Yes If all of the above answers are "NO", then may proceed with Cephalosporin use.   . Shrimp [Shellfish Allergy] Anaphylaxis, Hives and Itching    Family History: Family History  Problem  Relation Age of Onset  . Diabetes Father   . Kidney disease Father        failure due to diabetes  . Hypertension Mother   . Heart attack Maternal Grandmother 20    Social History: Social History   Tobacco Use  . Smoking status: Former Smoker    Packs/day: 0.10    Years: 2.00    Pack years: 0.20    Types: Cigarettes    Last attempt to quit: 05/13/2009    Years since quitting: 9.2  . Smokeless tobacco: Never Used    Substance Use Topics  . Alcohol use: Yes    Alcohol/week: 1.0 standard drinks    Types: 1 Glasses of wine per week    Comment: occaional  . Drug use: No   Social History   Social History Narrative  . Not on file    Review of Systems:  CONSTITUTIONAL: No fevers, chills, night sweats, or weight loss.  *** EYES: No visual changes or eye pain ENT: No hearing changes.  No history of nose bleeds.   RESPIRATORY: No cough, wheezing and shortness of breath.   CARDIOVASCULAR: Negative for chest pain, and palpitations.   GI: Negative for abdominal discomfort, blood in stools or black stools.  No recent change in bowel habits.   GU:  No history of incontinence.   MUSCLOSKELETAL: No history of joint pain or swelling.  No myalgias.   SKIN: Negative for lesions, rash, and itching.   HEMATOLOGY/ONCOLOGY: Negative for prolonged bleeding, bruising easily, and swollen nodes.  No history of cancer.   ENDOCRINE: Negative for cold or heat intolerance, polydipsia or goiter.   PSYCH:  ***depression or anxiety symptoms.   NEURO: As Above.   Vital Signs:  There were no vitals taken for this visit.   General Medical Exam:  *** General:  Well appearing, comfortable.   Eyes/ENT: see cranial nerve examination.   Neck:   No carotid bruits. Respiratory:  Clear to auscultation, good air entry bilaterally.   Cardiac:  Regular rate and rhythm, no murmur.   Extremities:  No deformities, edema, or skin discoloration.  Skin:  No rashes or lesions.  Neurological Exam: MENTAL STATUS including orientation to time, place, person, recent and remote memory, attention span and concentration, language, and fund of knowledge is ***normal.  Speech is not dysarthric.  CRANIAL NERVES: II:  No visual field defects.  Unremarkable fundi.   III-IV-VI: Pupils equal round and reactive to light.  Normal conjugate, extra-ocular eye movements in all directions of gaze.  No nystagmus.  No ptosis***.   V:  Normal facial  sensation.    VII:  Normal facial symmetry and movements.   VIII:  Normal hearing and vestibular function.   IX-X:  Normal palatal movement.   XI:  Normal shoulder shrug and head rotation.   XII:  Normal tongue strength and range of motion, no deviation or fasciculation.  MOTOR:  No atrophy, fasciculations or abnormal movements.  No pronator drift.   Right Upper Extremity:    Left Upper Extremity:    Deltoid  5/5   Deltoid  5/5   Biceps  5/5   Biceps  5/5   Triceps  5/5   Triceps  5/5   Wrist extensors  5/5   Wrist extensors  5/5   Wrist flexors  5/5   Wrist flexors  5/5   Finger extensors  5/5   Finger extensors  5/5   Finger flexors  5/5   Finger flexors  5/5   Dorsal interossei  5/5   Dorsal interossei  5/5   Abductor pollicis  5/5   Abductor pollicis  5/5   Tone (Ashworth scale)  0  Tone (Ashworth scale)  0   Right Lower Extremity:    Left Lower Extremity:    Hip flexors  5/5   Hip flexors  5/5   Hip extensors  5/5   Hip extensors  5/5   Knee flexors  5/5   Knee flexors  5/5   Knee extensors  5/5   Knee extensors  5/5   Dorsiflexors  5/5   Dorsiflexors  5/5   Plantarflexors  5/5   Plantarflexors  5/5   Toe extensors  5/5   Toe extensors  5/5   Toe flexors  5/5   Toe flexors  5/5   Tone (Ashworth scale)  0  Tone (Ashworth scale)  0   MSRs:  Right                                                                 Left brachioradialis 2+  brachioradialis 2+  biceps 2+  biceps 2+  triceps 2+  triceps 2+  patellar 2+  patellar 2+  ankle jerk 2+  ankle jerk 2+  Hoffman no  Hoffman no  plantar response down  plantar response down   SENSORY:  Normal and symmetric perception of light touch, pinprick, vibration, and proprioception.  Romberg's sign absent.   COORDINATION/GAIT: Normal finger-to- nose-finger and heel-to-shin.  Intact rapid alternating movements bilaterally.  Able to rise from a chair without using arms.  Gait narrow based and stable. Tandem and stressed gait intact.      IMPRESSION: *** NCS/EMG of the left leg  PLAN/RECOMMENDATIONS:  *** Return to clinic in *** months.    Thank you for allowing me to participate in patient's care.  If I can answer any additional questions, I would be pleased to do so.    Sincerely,    Katlyne Nishida K. Allena Katz, DO

## 2018-08-12 ENCOUNTER — Ambulatory Visit: Payer: Medicaid Other | Admitting: Neurology

## 2019-02-12 ENCOUNTER — Encounter (HOSPITAL_COMMUNITY): Payer: Self-pay

## 2019-02-12 ENCOUNTER — Ambulatory Visit (HOSPITAL_COMMUNITY)
Admission: EM | Admit: 2019-02-12 | Discharge: 2019-02-12 | Disposition: A | Discharge status: Home / Self Care | Payer: Medicaid Other | Attending: Family Medicine | Admitting: Family Medicine

## 2019-02-12 ENCOUNTER — Other Ambulatory Visit: Payer: Self-pay

## 2019-02-12 DIAGNOSIS — Z3202 Encounter for pregnancy test, result negative: Secondary | ICD-10-CM

## 2019-02-12 DIAGNOSIS — M25552 Pain in left hip: Secondary | ICD-10-CM

## 2019-02-12 DIAGNOSIS — N938 Other specified abnormal uterine and vaginal bleeding: Secondary | ICD-10-CM

## 2019-02-12 DIAGNOSIS — D259 Leiomyoma of uterus, unspecified: Secondary | ICD-10-CM

## 2019-02-12 LAB — POCT URINALYSIS DIP (DEVICE)
Bilirubin Urine: NEGATIVE
Glucose, UA: NEGATIVE mg/dL
Hgb urine dipstick: NEGATIVE
Ketones, ur: NEGATIVE mg/dL
Leukocytes,Ua: NEGATIVE
Nitrite: NEGATIVE
Protein, ur: NEGATIVE mg/dL
Specific Gravity, Urine: 1.025 (ref 1.005–1.030)
Urobilinogen, UA: 1 mg/dL (ref 0.0–1.0)
pH: 7 (ref 5.0–8.0)

## 2019-02-12 LAB — POCT PREGNANCY, URINE: Preg Test, Ur: NEGATIVE

## 2019-02-12 MED ORDER — IBUPROFEN 800 MG PO TABS: 800.0000 mg | ORAL_TABLET | Freq: Three times a day (TID) | ORAL | 0 refills | Status: DC | PRN

## 2019-02-12 NOTE — ED Triage Notes (Addendum)
Pt states she has left side flank pain that's radiating down her left leg.x 1 month. Pt states her period is on but spotting. Pt states its very light.

## 2019-02-12 NOTE — ED Provider Notes (Signed)
[facility]    CSN: [id number] Arrival date & time: 02/12/19  1050      History   Chief Complaint Chief Complaint  Patient presents with  . Flank Pain    HPI Breanna Miranda is a 33 y.o. female.   Patient presents with left hip pain and LLQ "cramping" abdominal pain intermittently for the past month.  She is currently not experiencing any abdominal pain.  She states her last menstrual cycle was 1 week ago but only lasted 2 days.  She denies falls or injury.  She denies fever, chills, vomiting, diarrhea, constipation, back pain, radiating pain, dysuria, vaginal discharge, pelvic pain, or other symptoms.  The history is provided by the patient.    Past Medical History:  Diagnosis Date  . Anemia   . Anxiety   . Asthma   . Blood transfusion without reported diagnosis 2016  . Chiari I malformation ([place])   . Empty sella ([place])   . Irregular heart rhythm   . Menorrhagia     Patient Active Problem List   Diagnosis Date Noted  . DUB (dysfunctional uterine bleeding) 01/15/2018  . Fibroid uterus 01/15/2018  . 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 Vaginal discharge 11/03/2017  . Screening for cervical cancer 11/03/2017  . Insomnia 10/15/2017  . Symptomatic anemia 09/21/2017  . Pica in adults   . Chest pain 08/15/2016  . Tachycardia 08/15/2016  . Hypokalemia 08/15/2016  . Hyperglycemia 08/15/2016  . Syncope 08/12/2014  . Menorrhagia 08/10/2014  . Anxiety 08/10/2014  . Anemia 05/06/2012    Past Surgical History:  Procedure Laterality Date  . CHOLECYSTECTOMY    . TUBAL LIGATION      OB History    Gravida  5   Para  4   Term  2   Preterm  2   AB  1   Living  4     SAB  1   TAB  0   Ectopic  0   Multiple  0   Live Births  4            Home Medications    Prior to Admission medications   Medication Sig Start Date End Date Taking? Authorizing Provider  benzonatate (TESSALON) 100 MG  capsule Take 1 capsule (100 mg total) by mouth every 8 (eight) hours. 05/03/18   Wurst, [place], PA-C  cetirizine-pseudoephedrine (ZYRTEC-D) 5-120 MG tablet Take 1 tablet by mouth daily. 05/03/18   Wurst, [place], PA-C  chlorpheniramine-HYDROcodone (TUSSIONEX PENNKINETIC ER) 10-8 MG/5ML SUER Take 5 mLs by mouth at bedtime as needed for cough. 05/12/18   Kinnie Feil, MD  doxycycline (VIBRAMYCIN) 100 MG capsule Take 1 capsule (100 mg total) by mouth 2 (two) times daily. 05/06/18   Wurst, [place], PA-C  ferrous sulfate 325 (65 FE) MG tablet Take 1 tablet (325 mg total) by mouth daily with breakfast. 09/23/17   Everrett Coombe, MD  fluticasone (FLONASE) 50 MCG/ACT nasal spray Place 2 sprays into both nostrils daily. 05/03/18   Wurst, [place], PA-C  ibuprofen (ADVIL) 800 MG tablet Take 1 tablet (800 mg total) by mouth every 8 (eight) hours as needed for moderate pain. 02/12/19   Sharion Balloon, NP  montelukast (SINGULAIR) 10 MG tablet Take 1 tablet (10 mg total) by mouth at bedtime. 05/12/18   Kinnie Feil, MD  naproxen sodium (ANAPROX) 550 MG tablet Take 1 tablet (550 mg total) by mouth 2 (two) times daily with  a meal. 12/24/17   Woodroe Mode, MD  polyethylene glycol [facility] / Floria Raveling) packet Take 17 g by mouth daily. 09/22/17   Everrett Coombe, MD    Family History Family History  Problem Relation Age of Onset  . Diabetes Father   . Kidney disease Father        failure due to diabetes  . Hypertension Mother   . Heart attack Maternal Grandmother 63    Social History Social History   Tobacco Use  . Smoking status: Former Smoker    Packs/day: 0.10    Years: 2.00    Pack years: 0.20    Types: Cigarettes    Quit date: 05/13/2009    Years since quitting: 9.7  . Smokeless tobacco: Never Used  Substance Use Topics  . Alcohol use: Yes    Alcohol/week: 1.0 standard drinks    Types: 1 Glasses of wine per week    Comment: occaional  . Drug use: No     Allergies   Penicillins and  Shrimp [shellfish allergy]   Review of Systems Review of Systems  Constitutional: Negative for chills and fever.  HENT: Negative for ear pain and sore throat.   Eyes: Negative for pain and visual disturbance.  Respiratory: Negative for cough and shortness of breath.   Cardiovascular: Negative for chest pain and palpitations.  Gastrointestinal: Positive for abdominal pain. Negative for constipation, diarrhea, nausea and vomiting.  Genitourinary: Negative for dysuria, flank pain, hematuria, pelvic pain and vaginal discharge.  Musculoskeletal: Positive for arthralgias. Negative for back pain.  Skin: Negative for color change and rash.  Neurological: Negative for seizures and syncope.  All other systems reviewed and are negative.    Physical Exam Triage Vital Signs ED Triage Vitals  Enc Vitals Group     BP 02/12/19 1109 119/71     Pulse Rate 02/12/19 1109 79     Resp 02/12/19 1109 18     Temp 02/12/19 1109 98.2 F (36.8 C)     Temp Source 02/12/19 1109 Oral     SpO2 02/12/19 1109 100 %     Weight 02/12/19 1107 214 lb (97.1 kg)     Height --      Head Circumference --      Peak Flow --      Pain Score 02/12/19 1107 8     Pain Loc --      Pain Edu? --      Excl. in [place]? --    No data found.  Updated Vital Signs BP 119/71 (BP Location: Right Arm)   Pulse 79   Temp 98.2 F (36.8 C) (Oral)   Resp 18   Wt 214 lb (97.1 kg)   LMP 02/12/2019   SpO2 100%   BMI 36.73 kg/m   Visual Acuity Right Eye Distance:   Left Eye Distance:   Bilateral Distance:    Right Eye Near:   Left Eye Near:    Bilateral Near:     Physical Exam Vitals signs and nursing note reviewed.  Constitutional:      General: She is not in acute distress.    Appearance: She is well-developed.  HENT:     Head: Normocephalic and atraumatic.     Mouth/Throat:     Mouth: Mucous membranes are moist.     Pharynx: Oropharynx is clear.  Eyes:     Conjunctiva/sclera: Conjunctivae normal.  Neck:      Musculoskeletal: Neck supple.  Cardiovascular:     Rate  and Rhythm: Normal rate and regular rhythm.     Heart sounds: No murmur.  Pulmonary:     Effort: Pulmonary effort is normal. No respiratory distress.     Breath sounds: Normal breath sounds.  Abdominal:     General: Bowel sounds are normal.     Palpations: Abdomen is soft.     Tenderness: There is no abdominal tenderness. There is no right CVA tenderness, left CVA tenderness, guarding or rebound.  Musculoskeletal: Normal range of motion.        General: Tenderness present. No swelling, deformity or signs of injury.     Right lower leg: No edema.     Left lower leg: No edema.     Comments: Left lateral hip and upper thigh tender to palpation.   Skin:    General: Skin is warm and dry.     Findings: No bruising, erythema, lesion or rash.  Neurological:     General: No focal deficit present.     Mental Status: She is alert and oriented to person, place, and time.     Sensory: No sensory deficit.     Motor: No weakness.     Coordination: Coordination normal.     Gait: Gait normal.     Deep Tendon Reflexes: Reflexes normal.      UC Treatments / Results  Labs (all labs ordered are listed, but only abnormal results are displayed) Labs Reviewed  POCT URINALYSIS DIP (DEVICE)  POCT PREGNANCY, URINE    EKG   Radiology No results found.  Procedures Procedures (including critical care time)  Medications Ordered in UC Medications - No data to display  Initial Impression / Assessment and Plan / UC Course  I have reviewed the triage vital signs and the nursing notes.  Pertinent labs & imaging results that were available during my care of the patient were reviewed by me and considered in my medical decision making (see chart for details).   Left hip pain.  Abdomen is nontender and patient denies current abdominal pain.  Urine pregnancy negative.  Urine dip negative.  Treating with ibuprofen as needed for discomfort.   Instructed patient to return here or follow-up with her PCP if her pain continues or worsens; or if she develops other symptoms such as fever, chills, vomiting, diarrhea, constipation, back pain, dysuria, pelvic pain, vaginal discharge.  Instructed patient to go to the emergency department if she develops acute abdominal pain.     Final Clinical Impressions(s) / UC Diagnoses   Final diagnoses:  Pain of left hip joint     Discharge Instructions     Your urine did not show signs of infection today.  Your urine pregnancy test was negative.  Take the ibuprofen as needed for your discomfort.    Return here or follow-up with your primary care provider if your pain continues or worsens; or if you develop fever, chills, vomiting, diarrhea, constipation, back pain, difficulty with urination, pelvic pain, vaginal discharge, or other concerning symptoms.    Go to the emergency department if you develop acute worsening abdominal pain.        ED Prescriptions    Medication Sig Dispense Auth. Provider   ibuprofen (ADVIL) 800 MG tablet Take 1 tablet (800 mg total) by mouth every 8 (eight) hours as needed for moderate pain. 30 tablet Sharion Balloon, NP     Controlled Substance Prescriptions [place] Controlled Substance Registry consulted? Not Applicable   Sharion Balloon, NP 02/12/19 1216

## 2019-02-12 NOTE — Discharge Instructions (Addendum)
Your urine did not show signs of infection today.  Your urine pregnancy test was negative.  Take the ibuprofen as needed for your discomfort.    Return here or follow-up with your primary care provider if your pain continues or worsens; or if you develop fever, chills, vomiting, diarrhea, constipation, back pain, difficulty with urination, pelvic pain, vaginal discharge, or other concerning symptoms.    Go to the emergency department if you develop acute worsening abdominal pain.

## 2019-02-24 ENCOUNTER — Ambulatory Visit: Payer: Medicaid Other | Admitting: Family Medicine

## 2019-03-09 ENCOUNTER — Encounter (HOSPITAL_COMMUNITY): Payer: Self-pay | Admitting: Emergency Medicine

## 2019-03-09 ENCOUNTER — Emergency Department (HOSPITAL_COMMUNITY): Payer: Medicaid Other

## 2019-03-09 ENCOUNTER — Other Ambulatory Visit: Payer: Self-pay

## 2019-03-09 ENCOUNTER — Emergency Department (HOSPITAL_COMMUNITY)
Admission: EM | Admit: 2019-03-09 | Discharge: 2019-03-10 | Disposition: A | Discharge status: Home / Self Care | Payer: Medicaid Other | Attending: Emergency Medicine | Admitting: Emergency Medicine

## 2019-03-09 DIAGNOSIS — S9031XA Contusion of right foot, initial encounter: Secondary | ICD-10-CM

## 2019-03-09 DIAGNOSIS — W208XXA Other cause of strike by thrown, projected or falling object, initial encounter: Secondary | ICD-10-CM | POA: Diagnosis not present

## 2019-03-09 DIAGNOSIS — Y929 Unspecified place or not applicable: Secondary | ICD-10-CM | POA: Diagnosis not present

## 2019-03-09 DIAGNOSIS — Y999 Unspecified external cause status: Secondary | ICD-10-CM | POA: Diagnosis not present

## 2019-03-09 DIAGNOSIS — S99921A Unspecified injury of right foot, initial encounter: Secondary | ICD-10-CM | POA: Diagnosis not present

## 2019-03-09 DIAGNOSIS — M7989 Other specified soft tissue disorders: Secondary | ICD-10-CM | POA: Diagnosis not present

## 2019-03-09 DIAGNOSIS — Z79899 Other long term (current) drug therapy: Secondary | ICD-10-CM | POA: Diagnosis not present

## 2019-03-09 DIAGNOSIS — Y939 Activity, unspecified: Secondary | ICD-10-CM | POA: Diagnosis not present

## 2019-03-09 DIAGNOSIS — Z87891 Personal history of nicotine dependence: Secondary | ICD-10-CM | POA: Diagnosis not present

## 2019-03-09 DIAGNOSIS — M79671 Pain in right foot: Secondary | ICD-10-CM | POA: Diagnosis not present

## 2019-03-09 DIAGNOSIS — J45909 Unspecified asthma, uncomplicated: Secondary | ICD-10-CM | POA: Insufficient documentation

## 2019-03-09 NOTE — ED Triage Notes (Signed)
Pt st's a metal bed frame fell on her right foot.  Pt c/o pain and swelling to right foot.

## 2019-03-10 NOTE — ED Notes (Signed)
Applied boot on right side; pt demonstrated proper usage of crutches and verbalized benefit of boot in place.

## 2019-03-10 NOTE — Discharge Instructions (Addendum)
Your examination today is most concerning for a contusion 1. Medications: alternate ibuprofen and tylenol for pain control, usual home medications 2. Treatment: rest, ice, elevate and use crutches, drink plenty of fluids, gentle stretching 3. Follow Up: Please followup with sports medicine as directed or your PCP in 1 week if no improvement for discussion of your diagnoses and further evaluation after today's visit; if you do not have a primary care doctor use the resource guide provided to find one; Please return to the ER for worsening symptoms or other concerns

## 2019-03-10 NOTE — ED Provider Notes (Signed)
[facility] EMERGENCY DEPARTMENT Provider Note   CSN: [id number] Arrival date & time: 03/09/19  2209     History   Chief Complaint Chief Complaint  Patient presents with  . Foot Pain    HPI Breanna Miranda is a 33 y.o. female.  Patient presents to the emergency department with a complaint of right foot pain. Symptoms began at approximately 2:30 PM today when her son dropped a large piece of metal on the middle of her foot.  Patient is sharp and constant nonradiating.  Worse with walking better with rest.   The patient has used any OTC pain medications to treat discomfort.  Pain rated as 5/10.       HPI  Past Medical History:  Diagnosis Date  . Anemia   . Anxiety   . Asthma   . Blood transfusion without reported diagnosis 2016  . Chiari I malformation ([place])   . Empty sella ([place])   . Irregular heart rhythm   . Menorrhagia     Patient Active Problem List   Diagnosis Date Noted  . DUB (dysfunctional uterine bleeding) 01/15/2018  . Fibroid uterus 01/15/2018  . 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 Vaginal discharge 11/03/2017  . Screening for cervical cancer 11/03/2017  . Insomnia 10/15/2017  . Symptomatic anemia 09/21/2017  . Pica in adults   . Chest pain 08/15/2016  . Tachycardia 08/15/2016  . Hypokalemia 08/15/2016  . Hyperglycemia 08/15/2016  . Syncope 08/12/2014  . Menorrhagia 08/10/2014  . Anxiety 08/10/2014  . Anemia 05/06/2012    Past Surgical History:  Procedure Laterality Date  . CHOLECYSTECTOMY    . TUBAL LIGATION       OB History    Gravida  5   Para  4   Term  2   Preterm  2   AB  1   Living  4     SAB  1   TAB  0   Ectopic  0   Multiple  0   Live Births  4            Home Medications    Prior to Admission medications   Medication Sig Start Date End Date Taking? Authorizing Provider  benzonatate (TESSALON) 100 MG capsule Take 1 capsule (100  mg total) by mouth every 8 (eight) hours. 05/03/18   Wurst, [place], PA-C  cetirizine-pseudoephedrine (ZYRTEC-D) 5-120 MG tablet Take 1 tablet by mouth daily. 05/03/18   Wurst, [place], PA-C  chlorpheniramine-HYDROcodone (TUSSIONEX PENNKINETIC ER) 10-8 MG/5ML SUER Take 5 mLs by mouth at bedtime as needed for cough. 05/12/18   Kinnie Feil, MD  doxycycline (VIBRAMYCIN) 100 MG capsule Take 1 capsule (100 mg total) by mouth 2 (two) times daily. 05/06/18   Wurst, [place], PA-C  ferrous sulfate 325 (65 FE) MG tablet Take 1 tablet (325 mg total) by mouth daily with breakfast. 09/23/17   Everrett Coombe, MD  fluticasone (FLONASE) 50 MCG/ACT nasal spray Place 2 sprays into both nostrils daily. 05/03/18   Wurst, [place], PA-C  ibuprofen (ADVIL) 800 MG tablet Take 1 tablet (800 mg total) by mouth every 8 (eight) hours as needed for moderate pain. 02/12/19   Sharion Balloon, NP  montelukast (SINGULAIR) 10 MG tablet Take 1 tablet (10 mg total) by mouth at bedtime. 05/12/18   Kinnie Feil, MD  naproxen sodium (ANAPROX) 550 MG tablet Take 1 tablet (550 mg total) by mouth 2 (two) times daily with  a meal. 12/24/17   Woodroe Mode, MD  polyethylene glycol [facility] / Floria Raveling) packet Take 17 g by mouth daily. 09/22/17   Everrett Coombe, MD    Family History Family History  Problem Relation Age of Onset  . Diabetes Father   . Kidney disease Father        failure due to diabetes  . Hypertension Mother   . Heart attack Maternal Grandmother 63    Social History Social History   Tobacco Use  . Smoking status: Former Smoker    Packs/day: 0.10    Years: 2.00    Pack years: 0.20    Types: Cigarettes    Quit date: 05/13/2009    Years since quitting: 9.8  . Smokeless tobacco: Never Used  Substance Use Topics  . Alcohol use: Yes    Alcohol/week: 1.0 standard drinks    Types: 1 Glasses of wine per week    Comment: occaional  . Drug use: No     Allergies   Penicillins and Shrimp [shellfish allergy]    Review of Systems Review of Systems  Constitutional: Negative for chills and fever.  HENT: Negative for congestion, sinus pressure and sinus pain.   Eyes: Negative for pain.  Respiratory: Negative for cough and shortness of breath.   Cardiovascular: Negative for chest pain and leg swelling.  Gastrointestinal: Negative for abdominal pain and vomiting.  Genitourinary: Negative for dysuria.  Musculoskeletal: Negative for myalgias.  Skin: Negative for rash.  Neurological: Negative for dizziness and headaches.     Physical Exam Updated Vital Signs BP 115/63   Pulse 73   Temp 98.3 F (36.8 C) (Oral)   Resp 16   Ht 5\' 4"  (1.626 m)   Wt 97.1 kg   LMP 03/05/2019   SpO2 100%   BMI 36.73 kg/m   Physical Exam Vitals signs and nursing note reviewed.  Constitutional:      General: She is not in acute distress.    Appearance: Normal appearance. She is not ill-appearing.  HENT:     Head: Normocephalic and atraumatic.  Eyes:     General: No scleral icterus.       Right eye: No discharge.        Left eye: No discharge.     Conjunctiva/sclera: Conjunctivae normal.  Cardiovascular:     Rate and Rhythm: Normal rate.     Pulses: Normal pulses.  Pulmonary:     Effort: Pulmonary effort is normal.     Breath sounds: No stridor.  Musculoskeletal:     Comments: Patient able to wiggle toes, rotate ankle with pain on the right side.  Right midfoot with obvious swelling, no bruising, tenderness to palpation over the right midfoot on dorsal and plantar surfaces.  Mild posterior heel tenderness to palpation.  Sensation intact.  Skin:    General: Skin is warm and dry.     Capillary Refill: Capillary refill takes less than 2 seconds.  Neurological:     Mental Status: She is alert and oriented to person, place, and time. Mental status is at baseline.     Comments: No sensory deficits distal to injury      ED Treatments / Results  Labs (all labs ordered are listed, but only abnormal  results are displayed) Labs Reviewed - No data to display  EKG    Radiology Dg Foot Complete Right  Result Date: 03/09/2019 CLINICAL DATA:  Bed frame fell on foot with pain, initial encounter EXAM: RIGHT FOOT COMPLETE -  3+ VIEW COMPARISON:  None. FINDINGS: Considerable soft tissue swelling is noted about the metatarsals dorsally. No acute fracture or dislocation is noted. IMPRESSION: Soft tissue swelling without acute bony abnormality. Electronically Signed   By: Inez Catalina M.D.   On: 03/09/2019 23:33    Procedures Procedures (including critical care time)  Medications Ordered in ED Medications - No data to display   Initial Impression / Assessment and Plan / ED Course  I have reviewed the triage vital signs and the nursing notes.  Pertinent labs & imaging results that were available during my care of the patient were reviewed by me and considered in my medical decision making (see chart for details).        Exam is not concerning for VTE, fracture or dislocation and patient is distally neurovascularly intact with stable vitals and clear mentation.   X-ray reviewed by me and radiology.  No fracture or dislocation.  Patient educated on likely diagnosis of contusion and offered resources including forced medicine follow up, light exercises and NSAID use instructions. Patient is understanding of plan.  Patient is able to ambulate with pain -- was provided crutches and postop shoe.    Final Clinical Impressions(s) / ED Diagnoses   Final diagnoses:  None    ED Discharge Orders    None        Tedd Sias, [place] 03/10/19 [place], [place], DO 03/10/19 [phone]

## 2019-05-19 ENCOUNTER — Other Ambulatory Visit: Payer: Self-pay

## 2019-05-19 ENCOUNTER — Encounter: Payer: Self-pay | Admitting: Family Medicine

## 2019-05-19 ENCOUNTER — Ambulatory Visit
Admission: RE | Admit: 2019-05-19 | Discharge: 2019-05-19 | Disposition: A | Discharge status: Home / Self Care | Payer: Medicaid Other | Source: Ambulatory Visit | Attending: Family Medicine | Admitting: Family Medicine

## 2019-05-19 ENCOUNTER — Ambulatory Visit (INDEPENDENT_AMBULATORY_CARE_PROVIDER_SITE_OTHER): Payer: Medicaid Other | Admitting: Family Medicine

## 2019-05-19 VITALS — BP 96/62 | HR 78 | Wt 211.6 lb

## 2019-05-19 DIAGNOSIS — M25561 Pain in right knee: Secondary | ICD-10-CM

## 2019-05-19 MED ORDER — MELOXICAM 15 MG PO TABS: 15.0000 mg | ORAL_TABLET | Freq: Every day | ORAL | 0 refills | Status: DC

## 2019-05-19 NOTE — Assessment & Plan Note (Signed)
Exam and findings on ultrasound consistent with possible tear of right medial meniscus.  Given that patient had recent injury to her right foot, it is possible that change in gait has added stress to this area.  She also has significant weakness in her right hip abductors and a tight right IT band.  Discussed with patient that we will send her for x-rays at this time and physical therapy.  Rx for Mobic 15 mg daily, advised to not use any other form of NSAIDs.  Can use Tylenol as needed for pain.  Advised to ice as well.  If she does not have improvement in the next 4 weeks, she is to return for further evaluation and possible MRI.

## 2019-05-19 NOTE — Telephone Encounter (Signed)
Called pharmacy and confirmed that they had not received the Rx for the mobic and they did not.  Called in the Rx and notified the pt that I did call it in and she should hear from them to pick it up.Anushri Casalino Zimmerman Rumple, CMA

## 2019-05-19 NOTE — Progress Notes (Signed)
Subjective: Chief Complaint  Patient presents with   Knee Pain    with swelling     HPI: Breanna Miranda is a 33 y.o. presenting to clinic today to discuss the following:  1 Right Knee Pain Patient reports that she has been having right knee pain for the last 3 weeks.  States that it started suddenly, but has gradually gotten worse.  Denies any known trauma or injury.  No remote history of trauma or injury either.  Does note that a few months ago, she did have an injury to her right foot and was in a brace for fracture, but is no longer bothering her.  Has noticed swelling.  Denies any fevers or redness.  Notes that she has had a sensation that her knee gives way when she is walking.  Pain worse at night, with walking.  Notes that no specific motion makes it worse.  Has been taking Motrin twice a day, without improvement.  Has not tried any ice or bracing.  Notes that pain radiates up and down her leg.  Reports that the pain is all around her kneecap.  Has never had anything like this before.     ROS noted in HPI. Chief complaint noted.  Other Pertinent PMH: Obesity Past Medical, Surgical, Social, and Family History Reviewed & Updated per EMR.      Social History   Tobacco Use  Smoking Status Former Smoker   Packs/day: 0.10   Years: 2.00   Pack years: 0.20   Types: Cigarettes   Quit date: 05/13/2009   Years since quitting: 10.0  Smokeless Tobacco Never Used   Smoking status noted.    Objective: BP 96/62    Pulse 78    Wt 211 lb 9.6 oz (96 kg)    LMP 04/18/2019 (Approximate)    SpO2 100%    BMI 36.32 kg/m  Vitals and nursing notes reviewed  Physical Exam:  General: 33 y.o. female in NAD Lungs: Breathing comfortably on room air Skin: warm and dry Right Knee: - Inspection: no gross deformity bilaterally. Mild swelling noted in right medial knee near medial patella, erythema or bruising. Skin intact - Palpation: TTP right medial joint line, No TTP Left  knee.  - ROM: full active ROM with flexion and extension in knee and hip bilaterally.  Pain with right knee flexion and extension at extremes of ROM and pain with right hip internal and external rotation - Strength: 5/5 strength bilaterally in all fields except right hip abductors 4/5 strength - Neuro/vasc: NV intact distally bilaterally - Special Tests: - LIGAMENTS: negative anterior and posterior drawer, negative Lachman's, no MCL or LCL laxity bilaterally -- MENISCUS: equivocal McMurray's medially on right, positive Thessaly with medial stress on right.  Negative McMurray's and Thessaly's on left. -- PF JOINT: nml patellar mobility bilaterally, but pain with motion on right.   -- IT band: positive Ober's on right   Hips: normal ROM, negative FABER and FADIR bilaterally   Limited US: MSK ultrasound right knee: Images were obtained both in the transverse and longitudinal plane. Patellar and quadriceps tendons were well visualized with no abnormalities. Mild effusion in suprapatellar bursa. Medial and lateral menisci were well visualized with small hypoechoic region noted in medial meniscus, doppler performed with slightly increased flow to area. Popliteal fossa was evaluated in both the transverse and longitudinal planes. No obvious Baker's cyst    No results found for this or any previous visit (from the past 72  hour(s)).  Assessment/Plan:  Acute pain of right knee Exam and findings on ultrasound consistent with possible tear of right medial meniscus.  Given that patient had recent injury to her right foot, it is possible that change in gait has added stress to this area.  She also has significant weakness in her right hip abductors and a tight right IT band.  Discussed with patient that we will send her for x-rays at this time and physical therapy.  Rx for Mobic 15 mg daily, advised to not use any other form of NSAIDs.  Can use Tylenol as needed for pain.  Advised to ice as well.  If she  does not have improvement in the next 4 weeks, she is to return for further evaluation and possible MRI.     PATIENT EDUCATION PROVIDED: See AVS    Diagnosis and plan along with any newly prescribed medication(s) were discussed in detail with this patient today. The patient verbalized understanding and agreed with the plan. Patient advised if symptoms worsen return to clinic or ER.    Orders Placed This Encounter  Procedures   DG Knee Complete 4 Views Right    Standing Status:   Future    Number of Occurrences:   1    Standing Expiration Date:   07/18/2020    Order Specific Question:   Reason for Exam (SYMPTOM  OR DIAGNOSIS REQUIRED)    Answer:   right knee pain    Order Specific Question:   Is patient pregnant?    Answer:   No    Order Specific Question:   Preferred imaging location?    Answer:   [facility]    Order Specific Question:   Radiology Contrast Protocol - do NOT remove file path    Answer:   [email or web address]   Ambulatory referral to Physical Therapy    Referral Priority:   Routine    Referral Type:   Physical Medicine    Referral Reason:   Specialty Services Required    Requested Specialty:   Physical Therapy    Number of Visits Requested:   1    Meds ordered this encounter  Medications   meloxicam (MOBIC) 15 MG tablet    Sig: Take 1 tablet (15 mg total) by mouth daily.    Dispense:  30 tablet    Refill:  [place], DO 05/19/2019, 10:44 AM PGY-2 [place]

## 2019-05-19 NOTE — Patient Instructions (Signed)
Thank you for coming to see me today. It was a pleasure. Today we talked about:   Your knee pain: There is a possible tear in your right medial meniscus.  You also have very weak right sided hip abductor muscles and a tight IT band.  I will send you to physical therapy to help with this.  I have also sent a prescription for Mobic which is an anti-inflammatory you can use once a day.  Do not use ibuprofen, Motrin when you are taking this.  You can also use Tylenol as needed for the pain.  Continue to ice your knee at least twice a day.  Come back and see me in 4 weeks if you have no improvement.  If you have any questions or concerns, please do not hesitate to call the office at [phone].  Best,   Arizona Constable, DO   Iliotibial Band Syndrome Rehab Ask your health care provider which exercises are safe for you. Do exercises exactly as told by your health care provider and adjust them as directed. It is normal to feel mild stretching, pulling, tightness, or discomfort as you do these exercises. Stop right away if you feel sudden pain or your pain gets significantly worse. Do not begin these exercises until told by your health care provider. Stretching and range-of-motion exercises These exercises warm up your muscles and joints and improve the movement and flexibility of your hip and pelvis. Quadriceps stretch, prone  1. Lie on your abdomen on a firm surface, such as a bed or padded floor (prone position). 2. Bend your left / right knee and reach back to hold your ankle or pant leg. If you cannot reach your ankle or pant leg, loop a belt around your foot and grab the belt instead. 3. Gently pull your heel toward your buttocks. Your knee should not slide out to the side. You should feel a stretch in the front of your thigh and knee (quadriceps). 4. Hold this position for ____15______ seconds. Repeat ____3______ times. Complete this exercise ___3_______ times a day. Iliotibial band  stretch An iliotibial band is a strong band of muscle tissue that runs from the outer side of your hip to the outer side of your thigh and knee. 1. Lie on your side with your left / right leg in the top position. 2. Bend both of your knees and grab your left / right ankle. Stretch out your bottom arm to help you balance. 3. Slowly bring your top knee back so your thigh goes behind your trunk. 4. Slowly lower your top leg toward the floor until you feel a gentle stretch on the outside of your left / right hip and thigh. If you do not feel a stretch and your knee will not fall farther, place the heel of your other foot on top of your knee and pull your knee down toward the floor with your foot. 5. Hold this position for ___15______ seconds. Repeat ___3_______ times. Complete this exercise ___3_______ times a day. Strengthening exercises These exercises build strength and endurance in your hip and pelvis. Endurance is the ability to use your muscles for a long time, even after they get tired. Straight leg raises, side-lying This exercise strengthens the muscles that rotate the leg at the hip and move it away from your body (hip abductors). 1. Lie on your side with your left / right leg in the top position. Lie so your head, shoulder, hip, and knee line up. You may  bend your bottom knee to help you balance. 2. Roll your hips slightly forward so your hips are stacked directly over each other and your left / right knee is facing forward. 3. Tense the muscles in your outer thigh and lift your top leg 4-6 inches (10-15 cm). 4. Hold this position for _____15_____ seconds. 5. Slowly return to the starting position. Let your muscles relax completely before doing another repetition. Repeat _____3_____ times. Complete this exercise ____3______ times a day. Leg raises, prone This exercise strengthens the muscles that move the hips (hip extensors). 1. Lie on your abdomen on your bed or a firm surface. You can  put a pillow under your hips if that is more comfortable for your lower back. 2. Bend your left / right knee so your foot is straight up in the air. 3. Squeeze your buttocks muscles and lift your left / right thigh off the bed. Do not let your back arch. 4. Tense your thigh muscle as hard as you can without increasing any knee pain. 5. Hold this position for ___15_______ seconds. 6. Slowly lower your leg to the starting position and allow it to relax completely. Repeat ______3____ times. Complete this exercise ____3_____ times a day. Hip hike 1. Stand sideways on a bottom step. Stand on your left / right leg with your other foot unsupported next to the step. You can hold on to the railing or wall for balance if needed. 2. Keep your knees straight and your torso square. Then lift your left / right hip up toward the ceiling. 3. Slowly let your left / right hip lower toward the floor, past the starting position. Your foot should get closer to the floor. Do not lean or bend your knees. Repeat ___3_______ times. Complete this exercise ___3_______ times a day. This information is not intended to replace advice given to you by your health care provider. Make sure you discuss any questions you have with your health care provider. Document Released: 06/10/2005 Document Revised: 10/01/2018 Document Reviewed: 04/01/2018 Elsevier Patient Education  2020 [employer].

## 2019-05-28 ENCOUNTER — Ambulatory Visit: Payer: Medicaid Other | Attending: Family Medicine | Admitting: Physical Therapy

## 2019-08-02 ENCOUNTER — Encounter (HOSPITAL_COMMUNITY): Payer: Self-pay

## 2019-08-02 ENCOUNTER — Other Ambulatory Visit: Payer: Self-pay

## 2019-08-02 ENCOUNTER — Ambulatory Visit (HOSPITAL_COMMUNITY)
Admission: EM | Admit: 2019-08-02 | Discharge: 2019-08-02 | Disposition: A | Discharge status: Home / Self Care | Payer: Medicaid Other | Attending: Internal Medicine | Admitting: Internal Medicine

## 2019-08-02 DIAGNOSIS — Z880 Allergy status to penicillin: Secondary | ICD-10-CM | POA: Insufficient documentation

## 2019-08-02 DIAGNOSIS — A084 Viral intestinal infection, unspecified: Secondary | ICD-10-CM | POA: Diagnosis not present

## 2019-08-02 DIAGNOSIS — U071 COVID-19: Secondary | ICD-10-CM | POA: Insufficient documentation

## 2019-08-02 DIAGNOSIS — Z8249 Family history of ischemic heart disease and other diseases of the circulatory system: Secondary | ICD-10-CM | POA: Diagnosis not present

## 2019-08-02 DIAGNOSIS — Z91013 Allergy to seafood: Secondary | ICD-10-CM | POA: Diagnosis not present

## 2019-08-02 DIAGNOSIS — Z833 Family history of diabetes mellitus: Secondary | ICD-10-CM | POA: Insufficient documentation

## 2019-08-02 DIAGNOSIS — Z87891 Personal history of nicotine dependence: Secondary | ICD-10-CM | POA: Diagnosis not present

## 2019-08-02 MED ORDER — BENZONATATE 100 MG PO CAPS: 100.0000 mg | ORAL_CAPSULE | Freq: Three times a day (TID) | ORAL | 0 refills | Status: DC

## 2019-08-02 MED ORDER — ONDANSETRON 4 MG PO TBDP: 4.0000 mg | ORAL_TABLET | Freq: Three times a day (TID) | ORAL | 0 refills | Status: DC | PRN

## 2019-08-02 MED ORDER — IBUPROFEN 800 MG PO TABS
800.0000 mg | ORAL_TABLET | Freq: Once | ORAL | Status: AC
  Administered 2019-08-02: 800 mg via ORAL

## 2019-08-02 MED ORDER — IBUPROFEN 600 MG PO TABS: 600.0000 mg | ORAL_TABLET | Freq: Four times a day (QID) | ORAL | 0 refills | Status: DC | PRN

## 2019-08-02 MED ORDER — IBUPROFEN 800 MG PO TABS
ORAL_TABLET | ORAL | Status: AC
  Filled 2019-08-02: qty 1

## 2019-08-02 NOTE — ED Triage Notes (Signed)
Pt presents to UC with body aches, fever, headache and diarrhea x 5 days. Pt is taking DayQuil/NyQuil and Theraflu without relief.

## 2019-08-02 NOTE — ED Provider Notes (Signed)
[place]    CSN: [id number] Arrival date & time: 08/02/19  0818      History   Chief Complaint Chief Complaint  Patient presents with  . Generalized Body Aches  . Diarrhea  . Headache  . Fever    HPI Breanna Miranda is a 34 y.o. female comes to urgent care with a 5-day history of generalized body aches, fever, headaches and diarrhea.  Symptoms have been persistent over that duration.  Patient has several bouts of diarrhea bowel movements every day.  Stools are not bloody.  She denies abdominal pain or distention.  No vomiting.  Patient has some nausea.  She has tried DayQuil/NyQuil and TheraFlu with no relief.  She denies any shortness of breath, sputum production.   She has a cough which is nonproductive.  HPI  Past Medical History:  Diagnosis Date  . Anemia   . Anxiety   . Asthma   . Blood transfusion without reported diagnosis 2016  . Chiari I malformation ([place])   . Empty sella ([place])   . Irregular heart rhythm   . Menorrhagia     Patient Active Problem List   Diagnosis Date Noted  . Acute pain of right knee 05/19/2019  . DUB (dysfunctional uterine bleeding) 01/15/2018  . Fibroid uterus 01/15/2018  . 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 Vaginal discharge 11/03/2017  . Screening for cervical cancer 11/03/2017  . Insomnia 10/15/2017  . Symptomatic anemia 09/21/2017  . Pica in adults   . Chest pain 08/15/2016  . Tachycardia 08/15/2016  . Hypokalemia 08/15/2016  . Hyperglycemia 08/15/2016  . Syncope 08/12/2014  . Menorrhagia 08/10/2014  . Anxiety 08/10/2014  . Anemia 05/06/2012    Past Surgical History:  Procedure Laterality Date  . CHOLECYSTECTOMY    . TUBAL LIGATION      OB History    Gravida  5   Para  4   Term  2   Preterm  2   AB  1   Living  4     SAB  1   TAB  0   Ectopic  0   Multiple  0   Live Births  4            Home Medications    Prior to Admission  medications   Medication Sig Start Date End Date Taking? Authorizing Provider  Phenylephrine-Pheniramine-DM [facility] COLD & COUGH PO) Take by mouth.   Yes [provider]  Pseudoeph-Doxylamine-DM-APAP (DAYQUIL/NYQUIL COLD/FLU RELIEF PO) Take by mouth.   Yes [provider]  raNITIdine HCl (ZANTAC PO) Take by mouth.   Yes [provider]  benzonatate (TESSALON) 100 MG capsule Take 1 capsule (100 mg total) by mouth every 8 (eight) hours. 08/02/19   Chase Picket, MD  ferrous sulfate 325 (65 FE) MG tablet Take 1 tablet (325 mg total) by mouth daily with breakfast. 09/23/17   Everrett Coombe, MD  ibuprofen (ADVIL) 600 MG tablet Take 1 tablet (600 mg total) by mouth every 6 (six) hours as needed. 08/02/19   Abbegayle Denault, Myrene Galas, MD  ondansetron (ZOFRAN ODT) 4 MG disintegrating tablet Take 1 tablet (4 mg total) by mouth every 8 (eight) hours as needed for nausea or vomiting. 08/02/19   Chase Picket, MD  fluticasone (FLONASE) 50 MCG/ACT nasal spray Place 2 sprays into both nostrils daily. 05/03/18 08/02/19  Wurst, Marye Round, PA-C  montelukast (SINGULAIR) 10 MG tablet Take 1 tablet (10 mg  total) by mouth at bedtime. 05/12/18 08/02/19  Kinnie Feil, MD    Family History Family History  Problem Relation Age of Onset  . Diabetes Father   . Kidney disease Father        failure due to diabetes  . Hypertension Mother   . Heart attack Maternal Grandmother 63    Social History Social History   Tobacco Use  . Smoking status: Former Smoker    Packs/day: 0.10    Years: 2.00    Pack years: 0.20    Types: Cigarettes    Quit date: 05/13/2009    Years since quitting: 10.2  . Smokeless tobacco: Never Used  Substance Use Topics  . Alcohol use: Yes    Alcohol/week: 1.0 standard drinks    Types: 1 Glasses of wine per week    Comment: occaional  . Drug use: No     Allergies   Penicillins and Shrimp [shellfish allergy]   Review of Systems Review of Systems  Constitutional:  Negative for activity change, chills, fatigue and fever.  HENT: Negative for congestion and sore throat.   Respiratory: Positive for cough. Negative for shortness of breath and wheezing.   Gastrointestinal: Positive for diarrhea, nausea and vomiting. Negative for abdominal distention and abdominal pain.  Genitourinary: Negative.  Negative for dysuria, frequency, hematuria and urgency.  Musculoskeletal: Positive for arthralgias and myalgias. Negative for gait problem.  Skin: Negative for rash and wound.  Neurological: Positive for headaches. Negative for dizziness, light-headedness and numbness.  Psychiatric/Behavioral: Negative for confusion and decreased concentration.     Physical Exam Triage Vital Signs ED Triage Vitals  Enc Vitals Group     BP 08/02/19 0851 115/75     Pulse Rate 08/02/19 0851 74     Resp 08/02/19 0851 18     Temp 08/02/19 0851 98.2 F (36.8 C)     Temp src --      SpO2 08/02/19 0851 100 %     Weight --      Height --      Head Circumference --      Peak Flow --      Pain Score 08/02/19 0847 10     Pain Loc --      Pain Edu? --      Excl. in [place]? --    No data found.  Updated Vital Signs BP 115/75 (BP Location: Left Arm)   Pulse 74   Temp 98.2 F (36.8 C)   Resp 18   LMP 07/26/2019 (Exact Date)   SpO2 100%   Visual Acuity Right Eye Distance:   Left Eye Distance:   Bilateral Distance:    Right Eye Near:   Left Eye Near:    Bilateral Near:     Physical Exam Vitals and nursing note reviewed.  Constitutional:      General: She is not in acute distress.    Appearance: She is ill-appearing.  Cardiovascular:     Rate and Rhythm: Normal rate and regular rhythm.  Pulmonary:     Effort: Pulmonary effort is normal. No respiratory distress.     Breath sounds: Normal breath sounds. No wheezing or rhonchi.  Abdominal:     General: There is no distension.     Palpations: Abdomen is soft. There is no mass.     Tenderness: There is no abdominal  tenderness. There is no guarding.  Musculoskeletal:        General: No tenderness. Normal range of motion.  Cervical back: Normal range of motion.  Skin:    General: Skin is warm.     Coloration: Skin is not cyanotic or pale.     Findings: No rash.  Neurological:     Mental Status: She is alert.     GCS: GCS eye subscore is 4. GCS verbal subscore is 5. GCS motor subscore is 6.     Sensory: No sensory deficit.      UC Treatments / Results  Labs (all labs ordered are listed, but only abnormal results are displayed) Labs Reviewed  NOVEL CORONAVIRUS, NAA (HOSP ORDER, SEND-OUT TO REF LAB; TAT 18-24 HRS)    EKG   Radiology No results found.  Procedures Procedures (including critical care time)  Medications Ordered in UC Medications  ibuprofen (ADVIL) tablet 800 mg (800 mg Oral Given 08/02/19 0936)    Initial Impression / Assessment and Plan / UC Course  I have reviewed the triage vital signs and the nursing notes.  Pertinent labs & imaging results that were available during my care of the patient were reviewed by me and considered in my medical decision making (see chart for details).     1.  Viral gastroenteritis: Optimize oral fluid intake Zofran as needed for nausea COVID-19 PCR Ibuprofen as needed for pain Tessalon Perles as needed for cough Patient is advised to self isolate until COVID-19 test is resulted If patient symptoms worsen she is welcome to return to urgent care to be reevaluated. Final Clinical Impressions(s) / UC Diagnoses   Final diagnoses:  Viral gastroenteritis   Discharge Instructions   None    ED Prescriptions    Medication Sig Dispense Auth. Provider   benzonatate (TESSALON) 100 MG capsule Take 1 capsule (100 mg total) by mouth every 8 (eight) hours. 21 capsule Farheen Pfahler, Myrene Galas, MD   ondansetron (ZOFRAN ODT) 4 MG disintegrating tablet Take 1 tablet (4 mg total) by mouth every 8 (eight) hours as needed for nausea or vomiting. 20 tablet  Ersa Delaney, Myrene Galas, MD   ibuprofen (ADVIL) 600 MG tablet Take 1 tablet (600 mg total) by mouth every 6 (six) hours as needed. 30 tablet Caralee Morea, Myrene Galas, MD     PDMP not reviewed this encounter.   Chase Picket, MD 08/03/19 [phone]

## 2019-08-04 ENCOUNTER — Encounter: Payer: Self-pay | Admitting: Family Medicine

## 2019-08-04 ENCOUNTER — Telehealth (HOSPITAL_COMMUNITY): Payer: Self-pay | Admitting: Emergency Medicine

## 2019-08-04 LAB — NOVEL CORONAVIRUS, NAA (HOSP ORDER, SEND-OUT TO REF LAB; TAT 18-24 HRS): SARS-CoV-2, NAA: DETECTED — AB

## 2019-08-04 NOTE — Telephone Encounter (Signed)

## 2019-08-05 ENCOUNTER — Telehealth (HOSPITAL_COMMUNITY): Payer: Self-pay | Admitting: Nurse Practitioner

## 2019-08-05 ENCOUNTER — Telehealth: Payer: Self-pay | Admitting: Nurse Practitioner

## 2019-08-05 ENCOUNTER — Encounter: Payer: Self-pay | Admitting: Nurse Practitioner

## 2019-08-05 NOTE — Telephone Encounter (Signed)
Called to Discuss with patient about Covid symptoms and the use of bamlanivimab, a monoclonal antibody infusion for those with mild to moderate Covid symptoms and at a high risk of hospitalization.     Pt is qualified for this infusion at the [facility] infusion center due to co-morbid conditions and/or a member of an at-risk group.     Unable to reach pt. Per chart review last day she would be eligible for MAB would be 2/13. Sent [employer].   Beckey Rutter, [place], AGNP-C [phone] ([place])

## 2019-08-05 NOTE — Telephone Encounter (Signed)
Called to discuss with Breanna Miranda about Covid symptoms and the use of bamlanivimab, a monoclonal antibody infusion for those with mild to moderate Covid symptoms and at a high risk of hospitalization.     Pt is qualified for this infusion at the [facility] infusion center due to co-morbid conditions and/or a member of an at-risk group, however declines infusion at this time. All questions answered.   Symptoms tier reviewed as well as criteria for ending isolation.  Symptoms reviewed that would warrant ED/Hospital evaluation. Preventative practices reviewed. Patient verbalized understanding. Patient advised to call back if he decides that he does want to get infusion. Callback number to the infusion center given. Patient advised to go to Urgent care or ED with severe symptoms. Last date she would be eligible for infusion is 08/07/19.   Patient Active Problem List   Diagnosis Date Noted  . Acute pain of right knee 05/19/2019  . DUB (dysfunctional uterine bleeding) 01/15/2018  . Fibroid uterus 01/15/2018  . 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 Vaginal discharge 11/03/2017  . Screening for cervical cancer 11/03/2017  . Insomnia 10/15/2017  . Symptomatic anemia 09/21/2017  . Pica in adults   . Chest pain 08/15/2016  . Tachycardia 08/15/2016  . Hypokalemia 08/15/2016  . Hyperglycemia 08/15/2016  . Syncope 08/12/2014  . Menorrhagia 08/10/2014  . Anxiety 08/10/2014  . Anemia 05/06/2012    Alda Lea, AGPCNP-BC Pager: [phone] Amion: Bjorn Pippin

## 2019-11-17 IMAGING — DX DG FOOT COMPLETE 3+V*R*
3 series · 3 of 3 positions shown · non-contrast
Comparison: None.

CLINICAL DATA: Bed frame fell on foot with pain, initial encounter

EXAM:
RIGHT FOOT COMPLETE - 3+ VIEW

[foot ap]
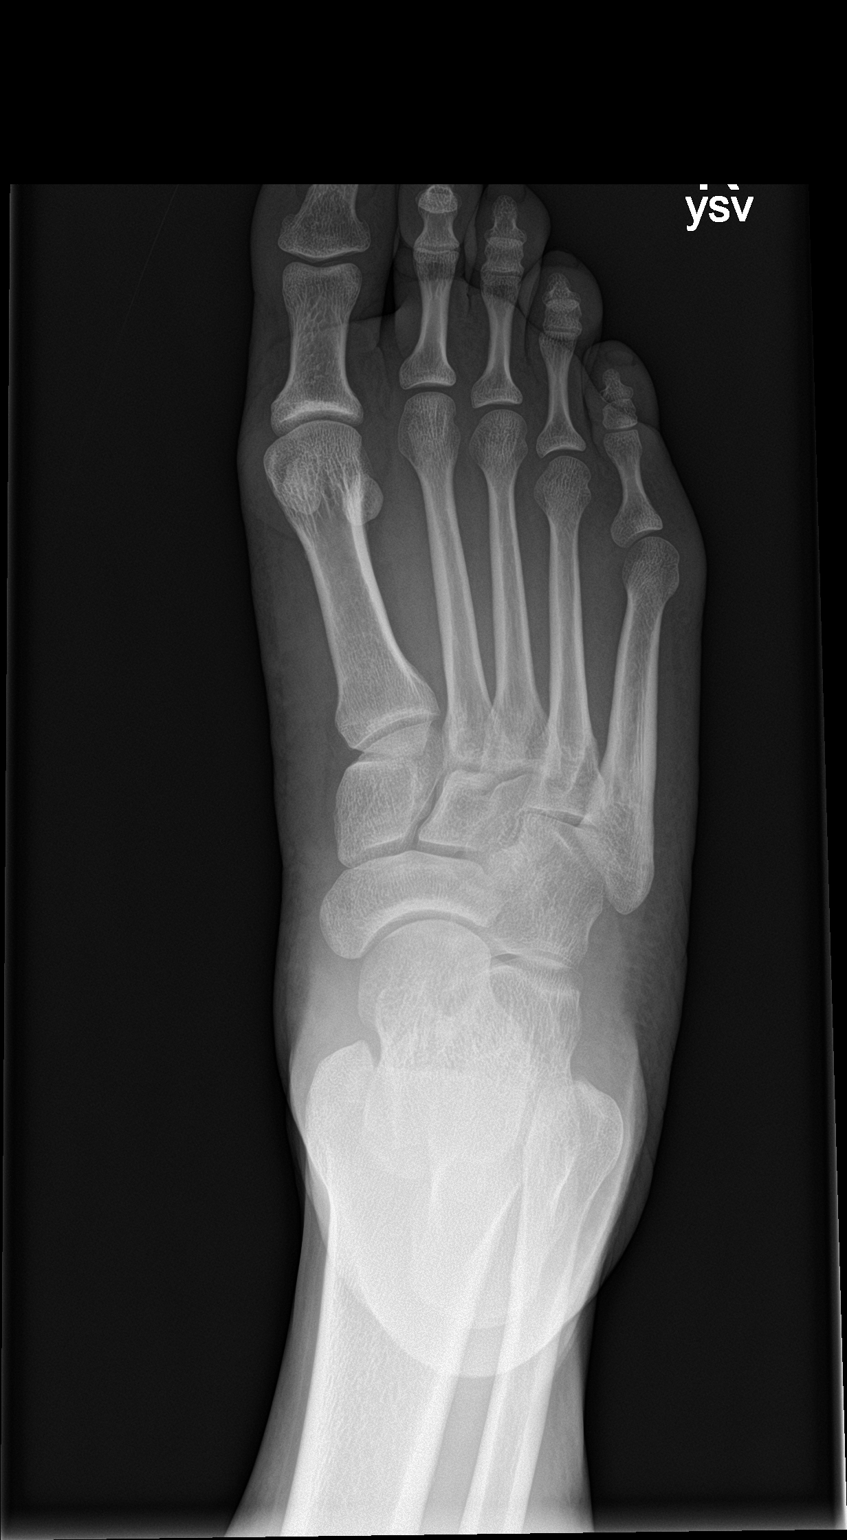

[foot obl]
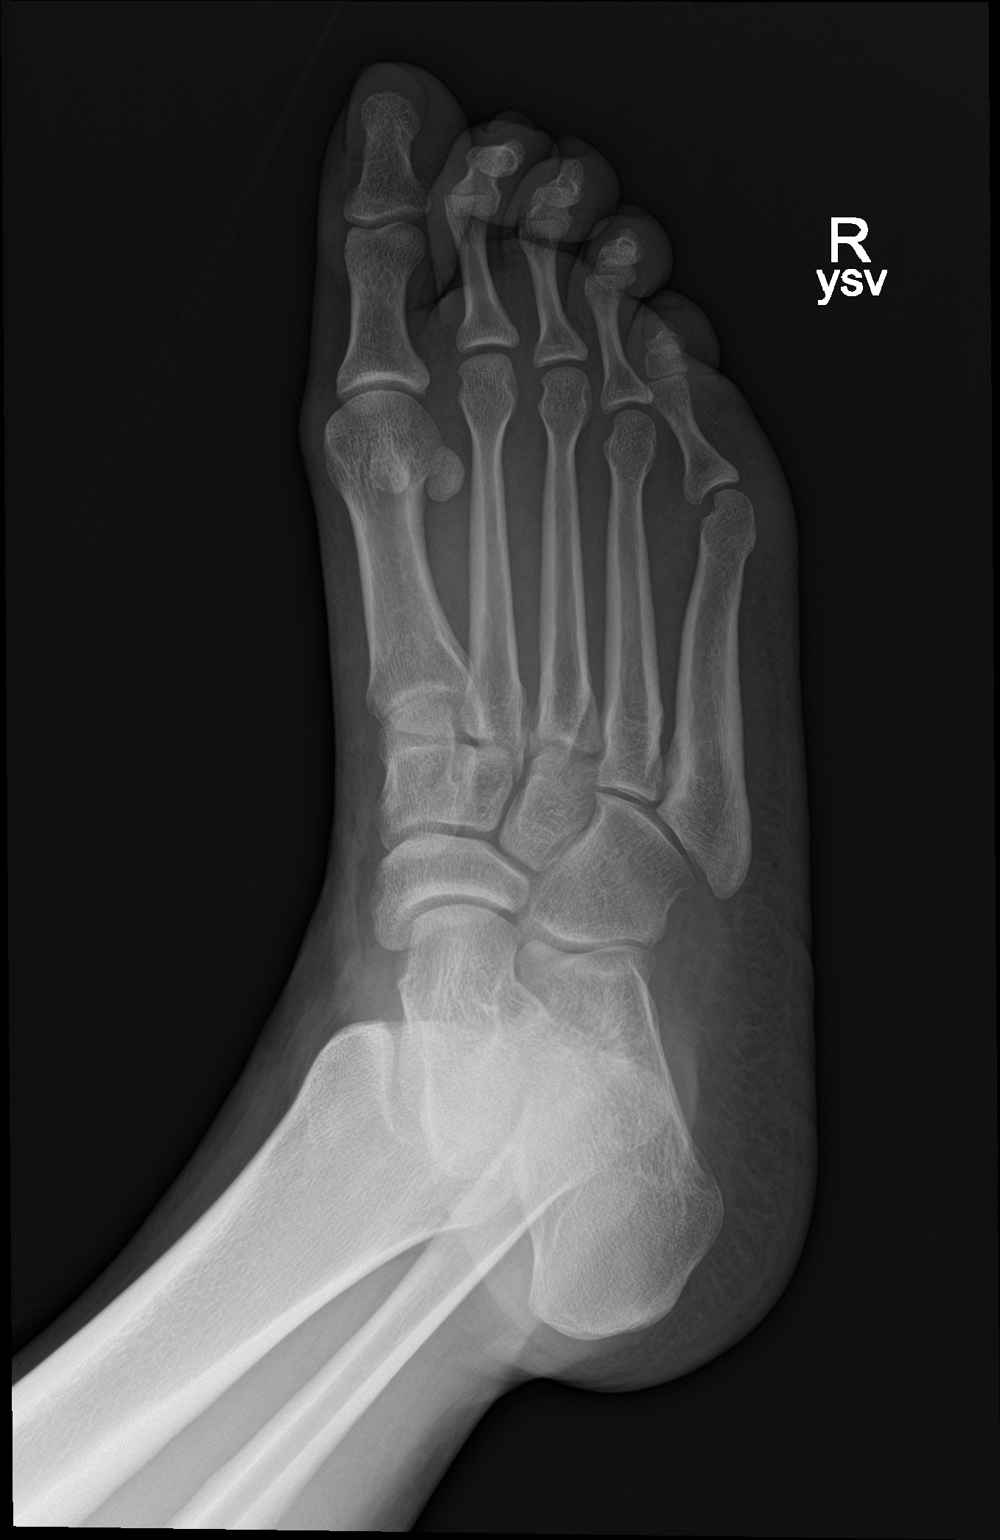

[foot lat]
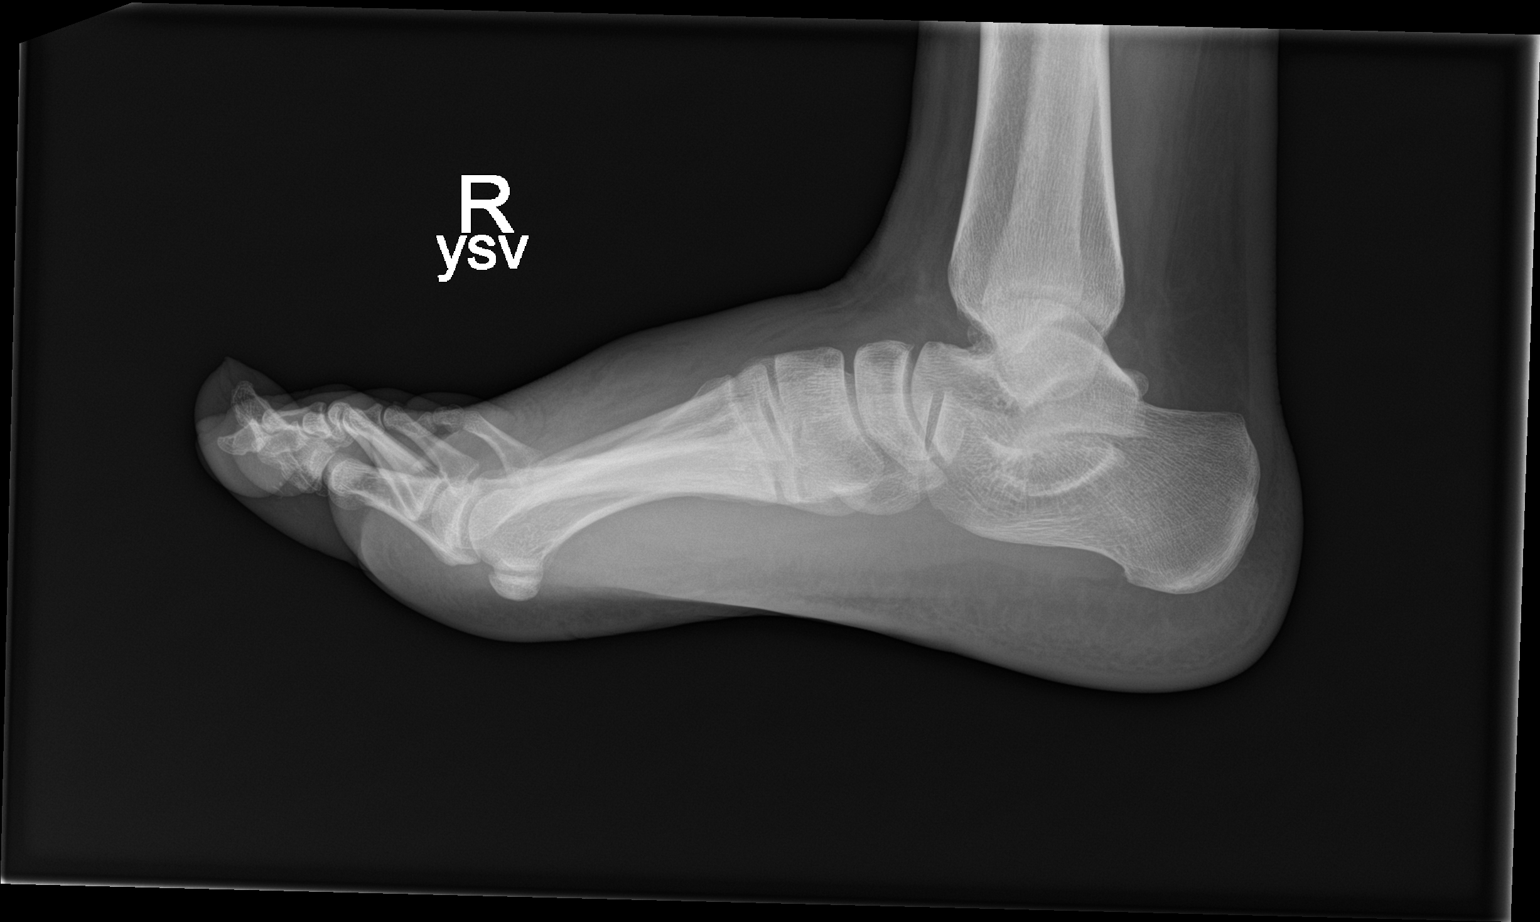

[3 of 3 positions shown; findings below may reference images not displayed]

FINDINGS: Considerable soft tissue swelling is noted about the metatarsals
dorsally. No acute fracture or dislocation is noted.
IMPRESSION: Soft tissue swelling without acute bony abnormality.

## 2019-12-13 ENCOUNTER — Ambulatory Visit: Payer: Medicaid Other | Attending: Internal Medicine

## 2019-12-13 DIAGNOSIS — Z20822 Contact with and (suspected) exposure to covid-19: Secondary | ICD-10-CM

## 2019-12-14 LAB — NOVEL CORONAVIRUS, NAA: SARS-CoV-2, NAA: NOT DETECTED

## 2019-12-14 LAB — SARS-COV-2, NAA 2 DAY TAT

## 2019-12-27 ENCOUNTER — Other Ambulatory Visit: Payer: Self-pay

## 2019-12-27 ENCOUNTER — Encounter (HOSPITAL_COMMUNITY): Payer: Self-pay | Admitting: Emergency Medicine

## 2019-12-27 ENCOUNTER — Ambulatory Visit (HOSPITAL_COMMUNITY)
Admission: EM | Admit: 2019-12-27 | Discharge: 2019-12-27 | Disposition: A | Discharge status: Home / Self Care | Payer: Medicaid Other | Attending: Family Medicine | Admitting: Family Medicine

## 2019-12-27 DIAGNOSIS — R300 Dysuria: Secondary | ICD-10-CM

## 2019-12-27 DIAGNOSIS — Z3202 Encounter for pregnancy test, result negative: Secondary | ICD-10-CM

## 2019-12-27 DIAGNOSIS — N390 Urinary tract infection, site not specified: Secondary | ICD-10-CM | POA: Insufficient documentation

## 2019-12-27 LAB — POCT URINALYSIS DIP (DEVICE)
Bilirubin Urine: NEGATIVE
Glucose, UA: NEGATIVE mg/dL
Nitrite: POSITIVE — AB
Protein, ur: 30 mg/dL — AB
Specific Gravity, Urine: >=1.03 (ref 1.005–1.030)
Urobilinogen, UA: 1 mg/dL (ref 0.0–1.0)
pH: 6 (ref 5.0–8.0)

## 2019-12-27 LAB — POC URINE PREG, ED: Preg Test, Ur: NEGATIVE

## 2019-12-27 MED ORDER — NITROFURANTOIN MONOHYD MACRO 100 MG PO CAPS
100.0000 mg | ORAL_CAPSULE | Freq: Two times a day (BID) | ORAL | 0 refills | Status: DC
Start: 2019-12-27 — End: 2020-04-24

## 2019-12-27 MED ORDER — FLUCONAZOLE 150 MG PO TABS
150.0000 mg | ORAL_TABLET | Freq: Every day | ORAL | 0 refills | Status: DC
Start: 2019-12-27 — End: 2020-04-24

## 2019-12-27 NOTE — Discharge Instructions (Addendum)
Make sure you drink plenty of water Take the antibiotic 2 times a day  take the Diflucan if needed for yeast infection symptoms Your urine has been sent to the laboratory for culture. You will be called if a change in antibiotics is required

## 2019-12-27 NOTE — ED Triage Notes (Signed)
Pt here for dysuria x 3 days with pain at end of urination

## 2019-12-27 NOTE — ED Provider Notes (Addendum)
[place]    CSN: [id number] Arrival date & time: 12/27/19  1527      History   Chief Complaint Chief Complaint  Patient presents with  . Dysuria    HPI Breanna Miranda is a 34 y.o. female.   HPI  Patient is here for dysuria.  For 3 days she has had burning just at the end of urination.  Slight frequency.  No hematuria.  No abdominal pain.  No vaginal discharge.  No flank pain.  No nausea or vomiting.  No history of kidney problems.  Past Medical History:  Diagnosis Date  . Anemia   . Anxiety   . Asthma   . Blood transfusion without reported diagnosis 2016  . Chiari I malformation ([place])   . Empty sella ([place])   . Irregular heart rhythm   . Menorrhagia     Patient Active Problem List   Diagnosis Date Noted  . Acute pain of right knee 05/19/2019  . DUB (dysfunctional uterine bleeding) 01/15/2018  . Fibroid uterus 01/15/2018  . 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 Vaginal discharge 11/03/2017  . Screening for cervical cancer 11/03/2017  . Insomnia 10/15/2017  . Symptomatic anemia 09/21/2017  . Pica in adults   . Chest pain 08/15/2016  . Tachycardia 08/15/2016  . Hypokalemia 08/15/2016  . Hyperglycemia 08/15/2016  . Syncope 08/12/2014  . Menorrhagia 08/10/2014  . Anxiety 08/10/2014  . Anemia 05/06/2012    Past Surgical History:  Procedure Laterality Date  . CHOLECYSTECTOMY    . TUBAL LIGATION      OB History    Gravida  5   Para  4   Term  2   Preterm  2   AB  1   Living  4     SAB  1   TAB  0   Ectopic  0   Multiple  0   Live Births  4            Home Medications    Prior to Admission medications   Medication Sig Start Date End Date Taking? Authorizing Provider  ferrous sulfate 325 (65 FE) MG tablet Take 1 tablet (325 mg total) by mouth daily with breakfast. 09/23/17   Everrett Coombe, MD  fluconazole (DIFLUCAN) 150 MG tablet Take 1 tablet (150 mg total) by mouth  daily. Repeat in 1 week if needed 12/27/19   Raylene Everts, MD  ibuprofen (ADVIL) 600 MG tablet Take 1 tablet (600 mg total) by mouth every 6 (six) hours as needed. 08/02/19   Lamptey, Myrene Galas, MD  nitrofurantoin, macrocrystal-monohydrate, (MACROBID) 100 MG capsule Take 1 capsule (100 mg total) by mouth 2 (two) times daily. 12/27/19   Raylene Everts, MD  ondansetron (ZOFRAN ODT) 4 MG disintegrating tablet Take 1 tablet (4 mg total) by mouth every 8 (eight) hours as needed for nausea or vomiting. 08/02/19   Lamptey, Myrene Galas, MD  raNITIdine HCl (ZANTAC PO) Take by mouth.    [provider]  fluticasone (FLONASE) 50 MCG/ACT nasal spray Place 2 sprays into both nostrils daily. 05/03/18 08/02/19  Wurst, Marye Round, PA-C  montelukast (SINGULAIR) 10 MG tablet Take 1 tablet (10 mg total) by mouth at bedtime. 05/12/18 08/02/19  Kinnie Feil, MD    Family History Family History  Problem Relation Age of Onset  . Diabetes Father   . Kidney disease Father        failure due to diabetes  .  Hypertension Mother   . Heart attack Maternal Grandmother 63    Social History Social History   Tobacco Use  . Smoking status: Former Smoker    Packs/day: 0.10    Years: 2.00    Pack years: 0.20    Types: Cigarettes    Quit date: 05/13/2009    Years since quitting: 10.6  . Smokeless tobacco: Never Used  Substance Use Topics  . Alcohol use: Yes    Alcohol/week: 1.0 standard drink    Types: 1 Glasses of wine per week    Comment: occaional  . Drug use: No     Allergies   Penicillins and Shrimp [shellfish allergy]   Review of Systems Review of Systems See HPI  Physical Exam Triage Vital Signs ED Triage Vitals  Enc Vitals Group     BP 12/27/19 1631 119/68     Pulse Rate 12/27/19 1631 67     Resp 12/27/19 1631 18     Temp 12/27/19 1631 98.1 F (36.7 C)     Temp Source 12/27/19 1631 Oral     SpO2 12/27/19 1631 97 %     Weight --      Height --      Head Circumference --      Peak  Flow --      Pain Score 12/27/19 1634 6     Pain Loc --      Pain Edu? --      Excl. in [place]? --    No data found.  Updated Vital Signs BP 119/68 (BP Location: Right Arm)   Pulse 67   Temp 98.1 F (36.7 C) (Oral)   Resp 18   SpO2 97%      Physical Exam Constitutional:      General: She is not in acute distress.    Appearance: She is well-developed.     Comments: Overweight  HENT:     Head: Normocephalic and atraumatic.     Mouth/Throat:     Comments: Mask is in place Eyes:     Conjunctiva/sclera: Conjunctivae normal.     Pupils: Pupils are equal, round, and reactive to light.  Cardiovascular:     Rate and Rhythm: Normal rate.  Pulmonary:     Effort: Pulmonary effort is normal. No respiratory distress.  Abdominal:     General: There is no distension.     Palpations: Abdomen is soft.     Comments: No abdominal or flank tenderness  Musculoskeletal:        General: Normal range of motion.     Cervical back: Normal range of motion.  Skin:    General: Skin is warm and dry.  Neurological:     Mental Status: She is alert.  Psychiatric:        Mood and Affect: Mood normal.        Behavior: Behavior normal.      UC Treatments / Results  Labs (all labs ordered are listed, but only abnormal results are displayed) Labs Reviewed  POCT URINALYSIS DIP (DEVICE) - Abnormal; Notable for the following components:      Result Value   Ketones, ur TRACE (*)    Hgb urine dipstick MODERATE (*)    Protein, ur 30 (*)    Nitrite POSITIVE (*)    Leukocytes,Ua TRACE (*)    All other components within normal limits  URINE CULTURE  POC URINE PREG, ED  Pregnancy test is negative  EKG   Radiology No results found.  Procedures Procedures (including critical care time)  Medications Ordered in UC Medications - No data to display  Initial Impression / Assessment and Plan / UC Course  I have reviewed the triage vital signs and the nursing notes.  Pertinent labs & imaging  results that were available during my care of the patient were reviewed by me and considered in my medical decision making (see chart for details).    Patient has a urinary tract infection.  Reviewed treatment.  Culture results.  Prevention Final Clinical Impressions(s) / UC Diagnoses   Final diagnoses:  Lower urinary tract infectious disease     Discharge Instructions     Make sure you drink plenty of water Take the antibiotic 2 times a day  take the Diflucan if needed for yeast infection symptoms Your urine has been sent to the laboratory for culture. You will be called if a change in antibiotics is required    ED Prescriptions    Medication Sig Dispense Auth. Provider   fluconazole (DIFLUCAN) 150 MG tablet Take 1 tablet (150 mg total) by mouth daily. Repeat in 1 week if needed 2 tablet Raylene Everts, MD   nitrofurantoin, macrocrystal-monohydrate, (MACROBID) 100 MG capsule Take 1 capsule (100 mg total) by mouth 2 (two) times daily. 10 capsule Raylene Everts, MD     PDMP not reviewed this encounter.   Raylene Everts, MD 12/27/19 1743    Raylene Everts, MD 12/27/19 1743

## 2019-12-29 LAB — URINE CULTURE: Culture: >=100000 — AB

## 2020-02-11 DIAGNOSIS — U071 COVID-19: Secondary | ICD-10-CM | POA: Diagnosis not present

## 2020-03-15 DIAGNOSIS — U071 COVID-19: Secondary | ICD-10-CM | POA: Diagnosis not present

## 2020-03-23 ENCOUNTER — Emergency Department (HOSPITAL_COMMUNITY)
Admission: EM | Admit: 2020-03-23 | Discharge: 2020-03-23 | Disposition: A | Discharge status: Home / Self Care | Payer: Medicaid Other | Attending: Emergency Medicine | Admitting: Emergency Medicine

## 2020-03-23 ENCOUNTER — Encounter (HOSPITAL_COMMUNITY): Payer: Self-pay

## 2020-03-23 ENCOUNTER — Other Ambulatory Visit: Payer: Self-pay

## 2020-03-23 ENCOUNTER — Emergency Department (HOSPITAL_COMMUNITY): Payer: Medicaid Other

## 2020-03-23 DIAGNOSIS — M25512 Pain in left shoulder: Secondary | ICD-10-CM | POA: Insufficient documentation

## 2020-03-23 DIAGNOSIS — R079 Chest pain, unspecified: Secondary | ICD-10-CM | POA: Insufficient documentation

## 2020-03-23 DIAGNOSIS — R6884 Jaw pain: Secondary | ICD-10-CM | POA: Insufficient documentation

## 2020-03-23 DIAGNOSIS — Z5321 Procedure and treatment not carried out due to patient leaving prior to being seen by health care provider: Secondary | ICD-10-CM | POA: Insufficient documentation

## 2020-03-23 LAB — CBC
HCT: 31.4 % — ABNORMAL LOW (ref 36.0–46.0)
Hemoglobin: 9.3 g/dL — ABNORMAL LOW (ref 12.0–15.0)
MCH: 21.1 pg — ABNORMAL LOW (ref 26.0–34.0)
MCHC: 29.6 g/dL — ABNORMAL LOW (ref 30.0–36.0)
MCV: 71.2 fL — ABNORMAL LOW (ref 80.0–100.0)
Platelets: 308 10*3/uL (ref 150–400)
RBC: 4.41 MIL/uL (ref 3.87–5.11)
RDW: 20.1 % — ABNORMAL HIGH (ref 11.5–15.5)
WBC: 6.5 10*3/uL (ref 4.0–10.5)
nRBC: 0 % (ref 0.0–0.2)

## 2020-03-23 LAB — BASIC METABOLIC PANEL
Anion gap: 8 (ref 5–15)
BUN: 12 mg/dL (ref 6–20)
CO2: 25 mmol/L (ref 22–32)
Calcium: 9 mg/dL (ref 8.9–10.3)
Chloride: 103 mmol/L (ref 98–111)
Creatinine, Ser: 0.79 mg/dL (ref 0.44–1.00)
GFR calc Af Amer: >60 mL/min (ref >60)
GFR calc non Af Amer: >60 mL/min (ref >60)
Glucose, Bld: 117 mg/dL — ABNORMAL HIGH (ref 70–99)
Potassium: 3.6 mmol/L (ref 3.5–5.1)
Sodium: 136 mmol/L (ref 135–145)

## 2020-03-23 LAB — I-STAT BETA HCG BLOOD, ED (NOT ORDERABLE): I-stat hCG, quantitative: <5 m[IU]/mL (ref <5)

## 2020-03-23 LAB — TROPONIN I (HIGH SENSITIVITY): Troponin I (High Sensitivity): <2 ng/L (ref <18)

## 2020-03-23 NOTE — ED Triage Notes (Signed)
Pt st s left sided chest pain under breast radiating to left shoulder and jaw. Pain was sudden beginning just prior to arrival.

## 2020-03-23 NOTE — ED Notes (Signed)
Pt eloped from waiting area. Called 3X.  

## 2020-03-24 ENCOUNTER — Telehealth: Payer: Self-pay | Admitting: *Deleted

## 2020-03-24 NOTE — Telephone Encounter (Signed)
Breanna Miranda presented to the ED and left before being seen by the provider on 03/23/20. The patient has been enrolled in an automated general discharge outreach program and 2 attempts to contact the patient will be made to follow up on their ED visit and subsequent needs. The care management team is available to provide assistance to this patient at any time.   Desmond Dike, CMA Deborra Medina) [place]  [place] [facility]

## 2020-04-10 ENCOUNTER — Encounter: Payer: Medicaid Other | Admitting: Family Medicine

## 2020-04-12 ENCOUNTER — Other Ambulatory Visit: Payer: Self-pay

## 2020-04-12 ENCOUNTER — Encounter: Payer: Self-pay | Admitting: Family Medicine

## 2020-04-12 ENCOUNTER — Ambulatory Visit (INDEPENDENT_AMBULATORY_CARE_PROVIDER_SITE_OTHER): Payer: Medicaid Other | Admitting: Family Medicine

## 2020-04-12 ENCOUNTER — Other Ambulatory Visit (HOSPITAL_COMMUNITY)
Admission: RE | Admit: 2020-04-12 | Discharge: 2020-04-12 | Disposition: A | Discharge status: Home / Self Care | Payer: Medicaid Other | Source: Ambulatory Visit | Attending: Family Medicine | Admitting: Family Medicine

## 2020-04-12 VITALS — BP 100/62 | HR 88 | Wt 213.2 lb

## 2020-04-12 DIAGNOSIS — Z0000 Encounter for general adult medical examination without abnormal findings: Secondary | ICD-10-CM

## 2020-04-12 DIAGNOSIS — R87612 Low grade squamous intraepithelial lesion on cytologic smear of cervix (LGSIL): Secondary | ICD-10-CM | POA: Insufficient documentation

## 2020-04-12 MED ORDER — SERTRALINE HCL 25 MG PO TABS
25.0000 mg | ORAL_TABLET | Freq: Every day | ORAL | 0 refills | Status: DC
Start: 2020-04-12 — End: 2020-04-24

## 2020-04-12 NOTE — Patient Instructions (Signed)
It was great seeing you today! Today we did your pap smear, Hepatitis C screen, and started you on a new mediation for anxiety and depression.   Please check-out at the front desk before leaving the clinic. I'd like to see you back in 1-2 weeks to follow up on the medication  but if you need to be seen earlier than that for any new issues we're happy to fit you in, just give [place] a call!  Visit Reminders: - Stop by the pharmacy to pick up your prescription of Sertraline (Zoloft). Information about medicine below.  - Continue to work on your healthy eating habits and incorporating exercise into your daily life   Regarding lab work today:  Due to recent changes in healthcare laws, you may see the results of your imaging and laboratory studies on MyChart before your provider has had a chance to review them.  I understand that in some cases there may be results that are confusing or concerning to you. Not all laboratory results come back in the same time frame and you may be waiting for multiple results in order to interpret others.  Please give [place] 72 hours in order for your provider to thoroughly review all the results before contacting the office for clarification of your results. If everything is normal, you will get a letter in the mail or a message in My Chart. Please give [place] a call if you do not hear from [place] after 2 weeks.   Feel free to call with any questions or concerns at any time, at [phone].   Take care,  Dr. Shary Key [facility]   Sertraline tablets What is this medicine? SERTRALINE (SER tra leen) is used to treat depression. It may also be used to treat obsessive compulsive disorder, panic disorder, post-trauma stress, premenstrual dysphoric disorder (PMDD) or social anxiety. This medicine may be used for other purposes; ask your health care provider or pharmacist if you have questions. COMMON BRAND NAME(S): Zoloft What should I tell my health care  provider before I take this medicine? They need to know if you have any of these conditions:  bleeding disorders  bipolar disorder or a family history of bipolar disorder  glaucoma  heart disease  high blood pressure  history of irregular heartbeat  history of low levels of calcium, magnesium, or potassium in the blood  if you often drink alcohol  liver disease  receiving electroconvulsive therapy  seizures  suicidal thoughts, plans, or attempt; a previous suicide attempt by you or a family member  take medicines that treat or prevent blood clots  thyroid disease  an unusual or allergic reaction to sertraline, other medicines, foods, dyes, or preservatives  pregnant or trying to get pregnant  breast-feeding How should I use this medicine? Take this medicine by mouth with a glass of water. Follow the directions on the prescription label. You can take it with or without food. Take your medicine at regular intervals. Do not take your medicine more often than directed. Do not stop taking this medicine suddenly except upon the advice of your doctor. Stopping this medicine too quickly may cause serious side effects or your condition may worsen. A special MedGuide will be given to you by the pharmacist with each prescription and refill. Be sure to read this information carefully each time. Talk to your pediatrician regarding the use of this medicine in children. While this drug may be prescribed for children as young as 7  years for selected conditions, precautions do apply. Overdosage: If you think you have taken too much of this medicine contact a poison control center or emergency room at once. NOTE: This medicine is only for you. Do not share this medicine with others. What if I miss a dose? If you miss a dose, take it as soon as you can. If it is almost time for your next dose, take only that dose. Do not take double or extra doses. What may interact with this medicine? Do  not take this medicine with any of the following medications:  cisapride  dronedarone  linezolid  MAOIs like Carbex, Eldepryl, Marplan, Nardil, and Parnate  methylene blue (injected into a vein)  pimozide  thioridazine This medicine may also interact with the following medications:  alcohol  amphetamines  aspirin and aspirin-like medicines  certain medicines for depression, anxiety, or psychotic disturbances  certain medicines for fungal infections like ketoconazole, fluconazole, posaconazole, and itraconazole  certain medicines for irregular heart beat like flecainide, quinidine, propafenone  certain medicines for migraine headaches like almotriptan, eletriptan, frovatriptan, naratriptan, rizatriptan, sumatriptan, zolmitriptan  certain medicines for sleep  certain medicines for seizures like carbamazepine, valproic acid, phenytoin  certain medicines that treat or prevent blood clots like warfarin, enoxaparin, dalteparin  cimetidine  digoxin  diuretics  fentanyl  isoniazid  lithium  NSAIDs, medicines for pain and inflammation, like ibuprofen or naproxen  other medicines that prolong the QT interval (cause an abnormal heart rhythm) like dofetilide  rasagiline  safinamide  supplements like St. John's wort, kava kava, valerian  tolbutamide  tramadol  tryptophan This list may not describe all possible interactions. Give your health care provider a list of all the medicines, herbs, non-prescription drugs, or dietary supplements you use. Also tell them if you smoke, drink alcohol, or use illegal drugs. Some items may interact with your medicine. What should I watch for while using this medicine? Tell your doctor if your symptoms do not get better or if they get worse. Visit your doctor or health care professional for regular checks on your progress. Because it may take several weeks to see the full effects of this medicine, it is important to continue your  treatment as prescribed by your doctor. Patients and their families should watch out for new or worsening thoughts of suicide or depression. Also watch out for sudden changes in feelings such as feeling anxious, agitated, panicky, irritable, hostile, aggressive, impulsive, severely restless, overly excited and hyperactive, or not being able to sleep. If this happens, especially at the beginning of treatment or after a change in dose, call your health care professional. Dennis Bast may get drowsy or dizzy. Do not drive, use machinery, or do anything that needs mental alertness until you know how this medicine affects you. Do not stand or sit up quickly, especially if you are an older patient. This reduces the risk of dizzy or fainting spells. Alcohol may interfere with the effect of this medicine. Avoid alcoholic drinks. Your mouth may get dry. Chewing sugarless gum or sucking hard candy, and drinking plenty of water may help. Contact your doctor if the problem does not go away or is severe. What side effects may I notice from receiving this medicine? Side effects that you should report to your doctor or health care professional as soon as possible:  allergic reactions like skin rash, itching or hives, swelling of the face, lips, or tongue  anxious  black, tarry stools  changes in vision  confusion  elevated  mood, decreased need for sleep, racing thoughts, impulsive behavior  eye pain  fast, irregular heartbeat  feeling faint or lightheaded, falls  feeling agitated, angry, or irritable  hallucination, loss of contact with reality  loss of balance or coordination  loss of memory  painful or prolonged erections  restlessness, pacing, inability to keep still  seizures  stiff muscles  suicidal thoughts or other mood changes  trouble sleeping  unusual bleeding or bruising  unusually weak or tired  vomiting Side effects that usually do not require medical attention (report to your  doctor or health care professional if they continue or are bothersome):  change in appetite or weight  change in sex drive or performance  diarrhea  increased sweating  indigestion, nausea  tremors This list may not describe all possible side effects. Call your doctor for medical advice about side effects. You may report side effects to FDA at 1-800-FDA-1088. Where should I keep my medicine? Keep out of the reach of children. Store at room temperature between 15 and 30 degrees C (59 and 86 degrees F). Throw away any unused medicine after the expiration date. NOTE: This sheet is a summary. It may not cover all possible information. If you have questions about this medicine, talk to your doctor, pharmacist, or health care provider.  2020 Elsevier/Gold Standard (2018-06-02 10:09:27)

## 2020-04-12 NOTE — Progress Notes (Signed)
    SUBJECTIVE:   CHIEF COMPLAINT / HPI:   Patient is here for her physical exam. She reports experiencing difficulty sleeping for the past 2 months. She expresses low energy to do anything. States she feels depressed and has been eating more even if she isn't hungry. States she doesn't want to get dressed anymore and does not find enjoyment in things she used to. Denies trauma or any big changes. 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 Reports feeling paranoid lately like someone is after her. She states she has a family history of anxiety. Denies having episodes of mania- flight of ideas, doing impulsive things, and denies every being diagnosed with bipolar disease or af family history of it.  Insomnia: States she gets about 3 hours of sleep a night. Avoiding TV screen and phone prior to falling asleep but states it does not make a difference.  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     Gyn: hx of menorrhagia. Regular periods. First 3 days heavy. Changing pad every hour. Ended last Wed.   Patient reports being sexually active with 1 partner. feels safe in her relationshiop. No concern for STIs  Denies tobacco, marijuana, or alcohol use    PERTINENT  PMH / PSH:  Anemia, anxiety, chest pain   OBJECTIVE:   BP 100/62   Pulse 88   Wt 213 lb 3.2 oz (96.7 kg)   LMP 03/04/2020 (Approximate)   SpO2 99%   BMI 36.60 kg/m   Physical Exam Constitutional:      General: She is not in acute distress.    Appearance: She is not ill-appearing.  Cardiovascular:     Rate and Rhythm: Normal rate and regular rhythm.     Pulses: Normal pulses.     Heart sounds: Normal heart sounds.  Pulmonary:     Effort: Pulmonary effort is normal.     Breath sounds: Normal breath sounds.  Abdominal:     General: There is no distension.     Palpations: Abdomen is soft.  Skin:    General: Skin is warm and dry.     Findings: No erythema.       ASSESSMENT/PLAN:   No problem-specific Assessment & Plan notes found for this encounter.   GAD and Depression Patient reports several months of feeling down, low energy, no interest in doing things she used to, inability to sleep, over eating. 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 Endocervical curretage was benign without atypia. Referral with GYN addressed AUB but not cervical lesion - repeat PAP  Health Maintenance - Hep C screen  - declines COVID, flu and Tdap vaccines. States she is highly considering COVID vaccine    [place]

## 2020-04-14 LAB — CYTOLOGY - PAP
Chlamydia: NEGATIVE
Comment: NEGATIVE
Comment: NEGATIVE
Comment: NEGATIVE
Comment: NORMAL
Diagnosis: NEGATIVE
High risk HPV: POSITIVE — AB
Neisseria Gonorrhea: NEGATIVE
Trichomonas: NEGATIVE

## 2020-04-24 ENCOUNTER — Ambulatory Visit (INDEPENDENT_AMBULATORY_CARE_PROVIDER_SITE_OTHER): Payer: Medicaid Other | Admitting: Family Medicine

## 2020-04-24 ENCOUNTER — Encounter: Payer: Self-pay | Admitting: Family Medicine

## 2020-04-24 ENCOUNTER — Other Ambulatory Visit: Payer: Self-pay

## 2020-04-24 VITALS — BP 100/60 | HR 75 | Ht 64.0 in | Wt 216.2 lb

## 2020-04-24 DIAGNOSIS — K219 Gastro-esophageal reflux disease without esophagitis: Secondary | ICD-10-CM | POA: Diagnosis not present

## 2020-04-24 DIAGNOSIS — F322 Major depressive disorder, single episode, severe without psychotic features: Secondary | ICD-10-CM | POA: Diagnosis present

## 2020-04-24 MED ORDER — CITALOPRAM HYDROBROMIDE 20 MG PO TABS: 20.0000 mg | ORAL_TABLET | Freq: Every day | ORAL | 1 refills | Status: DC

## 2020-04-24 MED ORDER — PANTOPRAZOLE SODIUM 40 MG PO TBEC: 40.0000 mg | DELAYED_RELEASE_TABLET | Freq: Every day | ORAL | 3 refills | Status: DC

## 2020-04-24 NOTE — Progress Notes (Signed)
Subjective:   Patient ID: Breanna Miranda    DOB: 1986-04-23, 34 y.o. female   MRN: [id number]  Breanna Miranda is a 34 y.o. female with a history of dysfunctional uterine bleeding and fibroids, depression/anxiety, insomnia, abnormal Pap smear here for depression follow-up.  Depression: Patient returns today for follow-up of depression.  During last visit she endorsed several months of feeling down, low energy, no interest in doing things, insomnia, overeating.  She was started on Zoloft 25 mg daily and encouraged to increase her physical activity.  She did note that she was interested in therapy.  She was here for today for follow-up.  Today she notes that her mood is overall improved but she continues to eat more causing her to gain more weight. She has gained an additional 3 lbs. She also complains of decreased libido.  PHQ-9 score of 19 today. She notes this is similar to last time. She notes that the Zoloft makes her feel off throughout the day.  She denies every being on any other antidepressants before. Her mom has taken several different kinds including zoloft.  Denies any SI/HI.  She is trying to exercise more. She works out a [employer] 2x/week and walk a lot at work (about 3 miles).   GERD:  Complains of epigastric burning resistant to multiple medications including zantac, pepcid, and nexium. Has used Protonix in the past with improvement in symptoms. She desires refill    Review of Systems:  Per HPI.   Objective:   BP 100/60   Pulse 75   Ht 5\' 4"  (1.626 m)   Wt 216 lb 3.2 oz (98.1 kg)   SpO2 100%   BMI 37.11 kg/m  Vitals and nursing note reviewed.  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 No SI/HI. Only mildly improved with Zoloft however other side effects makes this a less ideal option. Discussed management options including increasing zoloft and continuing an additional 3 weeks or trying alternative option. Patient opted to try alternative option. - stop Zoloft - Start Celexa 20mg  QD  - follow up in 3 weeks for monitoring - Safety plan discussed with the patient. They are aware of how to contact crisis services if need be and instructed to go to the nearest emergency room if they feel they are in imminent danger of harming themselves and or others, or symptoms are of out control or unbearable.   Gastroesophageal reflux disease without esophagitis Chronic. Uncontrolled with Zantac, Pepcid, Nexium. Has to take multiple Zantac a day. Notes improvement in her symptoms with Protonix in the past. Discussed potential side effects of longer PPI use. Patient has opted to continue with Protonix 40mg  QD. Recommended she follow up with PCP to further discuss her severe epigastric pain/reflux symptoms. Can consider referral to GI  - Tums or gaviscon for breakthrough pain  Health Maintenance: Due for COVID and Flu vaccine - Education provided and encouraged vaccination. Patient declined today.   Meds ordered this encounter  Medications  . citalopram (CELEXA) 20 MG tablet    Sig: Take 1 tablet (20 mg total) by mouth daily.    Dispense:  30 tablet  Refill:  1  . pantoprazole (PROTONIX) 40 MG tablet    Sig: Take 1 tablet (40 mg total) by mouth daily.    Dispense:  30 tablet    Refill:  [place], DO PGY-3, [facility] 04/25/2020 8:24 PM

## 2020-04-24 NOTE — Patient Instructions (Addendum)
It was a pleasure to see you today!  Thank you for choosing [facility] for your primary care.  Breanna Miranda was seen for depression follow up  Our plans for today were:  Stop taking Zoloft  Start taking Celexa 20mg  daily  Reach out to a therapist to establish care  I encourage you to continue to walk and go to the gym as this will provide natural endorphines that improve mood  We can consider a nutrition consult if your weight continues to increase  Follow up in 3 weeks  I have sent in a prescription for Protonix. Please take 1 tablet daily. I encourage you to follow up with your PCP to further discuss your symptoms  To keep you healthy, please keep in mind the following health maintenance items that you are due for:   1. Flu vaccine 2.  COVID vaccine   You should return to our clinic in 3 weeks for depression follow up.   Best Wishes,   Mina Marble, DO  Therapy and Counseling Resources Most providers on this list will take Medicaid. Patients with commercial insurance or Medicare should contact their insurance company to get a list of in network providers.  [place] [address], [place], [place] [address]       [phone]     [facility] [address] [place], [place]    Jinny Blossom [facility] [address] [place], [place]  [facility]:  [place]. [place]  [facility]:  [place] [address] [place]oni [place], [place]  [facility] (under & uninsured) [address], [place] [phone]    [email or web address]    [facility] [place]     Phone:  [phone]     Dennison Davis  [place] [address]      [place], [place]  ext [place]: [place], [place], [place]   [place] ([place] therapist) [address] [place] [address]   [place] [address]    [phone]    [facility]   [address],  [place], [place]   [place]  [place]  [facility]  [address] [place], [place]        [phone]  Open Access/Walk In Clinic under & uninsured [place],  [address] [place] [phone]):  Mon - Fri from 8 AM - 3 PM  [facility] [place],  ([place])   [place], [place]: [phone]) 8:30 - 12; 1 - 2:30  [facility] the [place],  [place], [place]    ([phone]):8:30 - 12; 2 - 3PM  [facility],  [address]  [place]; [phone]):   Mon - Fri 8 AM - 5 PM  [facility] [place]  MWF 12:30 to 3:00 or call to schedule an appointment  [phone]  Specific Provider options Psychology Today  [email or web address] 3. click on find a therapist  4. enter your zip code 5. left side and select or tailor a therapist for your specific need.   [facility]  Provider Directory [email or web address]  (Medicaid)   Follow all drop down to find a provider  [place] or [email or web address] [address], [place], [place] Recovery support and educational   In home counseling [place] Telephone: [phone]  office in [place] [email or web address]   Does not take reg. Medicaid or Medicare private insurance [facility], [place] health Choice, [facility], [place], [place], [place], [place] Health Choice  24- Hour Availability:  . [place] or [phone]  . Family Service of the [employer]  [phone]  [facility] Crisis Service  [phone]   . [place]  [phone] (after hours)  . Therapeutic Alternative/Mobile Crisis   [phone]  . [place] National Suicide Hotline  [phone] ([place])  . Call 911 or go to emergency room  . [employer]  3054192641);  [place] and [employer]   . [facility] ACCESS  [phone]); [place], [place], [place], [place], [place], [place], [place]   Gastroesophageal reflux (GERD) Reflux occurs when acid and food in the stomach come back up into the esophagus. Sometimes this goes all the way to the top of the esophagus! Gastroesophageal reflux disease (GERD) is reflux that occurs more than twice a week for a few weeks. It does not always come with symptoms of "heartburn." In fact, many people do not experience any burning symptoms. Instead, many people experience one or more of the following symptoms: hoarseness, throat clearing, a feeling of "something" in their throat, "too full" feeling, throat pain, dry cough, occasional trouble swallowing, burping or waterbrash. Most of the time, this can be managed from clinic, in working with your doctor. Reasons to seek emergency medical care would be for vomit that looks like coffee grounds or is bloody, stool that look like tar or is bloody, severe chest pain or shortness of breath, or an inability to swallow.   Treatment for GERD: The most effective starting point for treating GERD is your diet and lifestyle. This does not necessarily mean calorie restriction, but often it does. Be aware that diet and lifestyle modifications are often just as effective as taking medications in the treatment of GERD. Once you have started treatment for GERD, it may take 2-3 months before your symptoms are resolved.   Diet and Lifestyle changes:  ? Maintain a healthy BMI (body mass index). This means to maintain a healthy weight for your height. Increased abdominal girth is associated  with reflux. Weight loss often helps GERD symptoms dramatically.   ? Avoid the things that may increase heartburn. These include caffeine, alcohol, chocolate, fried or high fat foods, dairy (ice cream, yogurt, cheese, MILK, etc), chocolate, peppermint, carbonated beverages (soda or even carbonated water), spicy foods, onions, tomatoes, garlic, and tomato-based foods, citrus fruits, and juices. Consider reading the book Dr. Jacquiline Doe Reflux Diet, which is available on [employer].  ? Drink plenty of water. Your body was designed to run on H20- plain water that is! Not flavored water, iced tea, or other beverages. Recent studies have shown that drinking alkaline water occasionally can also help to improve GERD.   ? Do not eat large meals. When you eat a lot of food at one time, your stomach needs more acid to digest it. Eat multiple small meals each day instead of 3 large ones, and eat slowly. Do not eat meals 2 to 3 hours before bedtime.  ? Elevate the head of your bed. You may prop up your bedframe, or sleep  on a few pillows, or even in a recliner.  ? If you use tobacco, stop! Smoking weakens the lower esophageal sphincter. Vaping, smoking, E-cigarettes all count as "smoking" and can all make GERD worse.   ? Avoid tight clothing around your waist. Tight clothing can put pressure on your stomach and worsen GERD symptoms.  Medical Treatment for GERD ? PPI (proton pump inhibitors)- medications like Omeprazole (Prilosec), lansoprazole, pantoprazole, etc  - Risks- These are higher with long-term usage. These risks are osteoporosis, low calcium, low magnesium, C. Diff diarrhea, pneumonia, and potentially Alzheimer's disease (the studies on Alzheimer's are conflicting and there may or may not be an association).  - Benefit- the vast majority of people do really well on these medications without significant side effects. They are very effective medications for treating acid reflux.  o You must take these  medications on an empty stomach, about 30-45 minutes before breakfast. Then you actually need to eat a small meal. In some instances, you may be directed to do the same thing again before dinner.  o In general, try not to stop taking PPI's "cold [place]." This can often lead to rebound reflux. Ideally, you are on these medications for 3-6 months at a time and then Dr. Blenda Nicely will work with you to slowly come off them.  ? H2 blockers- ranitidine (Zantac), famotidine (Pepcid), cimetidine (Tagamet), etc o Risks- side effects are rare but may include mild diarrhea, constipation, rarely headaches (in less than 1.5% of people taking these medications) o Benefits- Rare side effects. Can be taken any time of day. Effective.  o Most commonly prescribed to be taken right before bed.  ? Gaviscon Advance o Taken after meals to reduce symptoms of GERD.  o Not for people with advanced kidney disease  Try to avoid triggers to your GERD such as coffee, red sauces, red wine, eating within 2 hours before bed, sodas, etc.

## 2020-04-25 DIAGNOSIS — K219 Gastro-esophageal reflux disease without esophagitis: Secondary | ICD-10-CM | POA: Insufficient documentation

## 2020-04-25 DIAGNOSIS — F322 Major depressive disorder, single episode, severe without psychotic features: Secondary | ICD-10-CM | POA: Insufficient documentation

## 2020-04-25 DIAGNOSIS — Z1231 Encounter for screening mammogram for malignant neoplasm of breast: Secondary | ICD-10-CM

## 2020-04-25 NOTE — Assessment & Plan Note (Addendum)
Chronic. Uncontrolled with Zantac, Pepcid, Nexium. Has to take multiple Zantac a day. Notes improvement in her symptoms with Protonix in the past. Discussed potential side effects of longer PPI use. Patient has opted to continue with Protonix 40mg  QD. Recommended she follow up with PCP to further discuss her severe epigastric pain/reflux symptoms. Can consider referral to GI  - Tums or gaviscon for breakthrough pain

## 2020-04-25 NOTE — Progress Notes (Signed)
Ok great I will let patient know! Does this need to be in colpo clinic?

## 2020-04-25 NOTE — Assessment & Plan Note (Signed)
Chronic, severe. No SI/HI. Only mildly improved with Zoloft however other side effects makes this a less ideal option. Discussed management options including increasing zoloft and continuing an additional 3 weeks or trying alternative option. Patient opted to try alternative option. - stop Zoloft - Start Celexa 20mg  QD  - follow up in 3 weeks for monitoring - Safety plan discussed with the patient. They are aware of how to contact crisis services if need be and instructed to go to the nearest emergency room if they feel they are in imminent danger of harming themselves and or others, or symptoms are of out control or unbearable.

## 2020-05-04 ENCOUNTER — Encounter: Payer: Self-pay | Admitting: Family Medicine

## 2020-05-11 ENCOUNTER — Encounter: Payer: Self-pay | Admitting: Family Medicine

## 2020-05-11 NOTE — Telephone Encounter (Signed)
Attempted to call patient at number listed in chart to gather more information and assess the need for additional appointment.   Patient did not answer phone, left HIPAA compliant VM for patient to return call to office to discuss in more detail.   Talbot Grumbling, RN

## 2020-05-15 ENCOUNTER — Ambulatory Visit: Payer: Medicaid Other | Admitting: Family Medicine

## 2020-05-20 ENCOUNTER — Other Ambulatory Visit: Payer: Self-pay | Admitting: Family Medicine

## 2020-05-25 ENCOUNTER — Other Ambulatory Visit: Payer: Self-pay | Admitting: Family Medicine

## 2020-05-25 ENCOUNTER — Ambulatory Visit (INDEPENDENT_AMBULATORY_CARE_PROVIDER_SITE_OTHER): Payer: Medicaid Other | Admitting: Family Medicine

## 2020-05-25 ENCOUNTER — Other Ambulatory Visit: Payer: Self-pay

## 2020-05-25 VITALS — BP 116/62 | HR 74 | Ht 64.0 in | Wt 220.0 lb

## 2020-05-25 DIAGNOSIS — R87619 Unspecified abnormal cytological findings in specimens from cervix uteri: Secondary | ICD-10-CM | POA: Diagnosis not present

## 2020-05-25 DIAGNOSIS — Z0000 Encounter for general adult medical examination without abnormal findings: Secondary | ICD-10-CM | POA: Diagnosis not present

## 2020-05-25 LAB — POCT URINE PREGNANCY: Preg Test, Ur: NEGATIVE

## 2020-05-25 MED ORDER — ACETAMINOPHEN 500 MG PO CAPS: 500.0000 mg | ORAL_CAPSULE | Freq: Once | ORAL | Status: DC

## 2020-05-25 NOTE — Patient Instructions (Signed)
Cervical Biopsy, Care After This sheet gives you information about how to care for yourself after your procedure. Your health care provider may also give you more specific instructions. If you have problems or questions, contact your health care provider. What can I expect after the procedure? After the procedure, it is common to have:  Cramping or mild pain for a few days.  Slight bleeding from the vagina for a few days.  Dark-colored vaginal discharge for a few days. Follow these instructions at home: Lifestyle  Do not douche until your health care provider approves.  Do not use tampons until your health care provider approves.  Do not have sex until your health care provider approves.  Return to your normal activities as told by your health care provider. Ask your health care provider what activities are safe for you. General instructions   Take over-the-counter and prescription medicines only as told by your health care provider.  Use a sanitary napkin until bleeding and discharge stop.  Keep all follow-up visits as told by your health care provider. This is important. Contact a health care provider if you have:  A fever or chills.  Bad-smelling vaginal discharge.  Itching or irritation around the vagina.  Lower abdominal pain. Get help right away if you:  Develop heavy vaginal bleeding that soaks more than one sanitary napkin an hour.  Faint.  Have severe pain in your lower abdomen. Summary  After the procedure, it is normal to have cramps, slight bleeding, and dark-colored discharge from the vagina.  After you return home, do not douche, have sex, or use a tampon until your health care provider approves.  Take over-the-counter and prescription medicines only as told by your health care provider.  Get help right away if you develop heavy bleeding, you faint, or you have severe pain in your lower abdomen. This information is not intended to replace advice  given to you by your health care provider. Make sure you discuss any questions you have with your health care provider. Document Revised: 11/13/2017 Document Reviewed: 11/13/2017 Elsevier Patient Education  2020 [employer].

## 2020-05-25 NOTE — Progress Notes (Signed)
Patient given 500 mg acetaminophen PO at 0920, per Dr. Macario Golds orders.   [place]: 6854-8830-14 Lot: [id number] A Exp: 10/21/2020  Unable to document in [facility] due to Epic issue.  Talbot Grumbling, RN

## 2020-05-25 NOTE — Progress Notes (Signed)
Patient ID: Breanna Miranda, female   DOB: 27-Oct-1985, 34 y.o.   MRN: [id number]  Chief Complaint  Patient presents with  . Follow-up    HPI Breanna Miranda is a 34 y.o. female.  Here for colposcopy evaluation. HPI  Indications: Pap smear on October 2021 showed: + HPV with normal cytology. Previous PAP report: LSIL with positive HPV. Previous colposcopy: LSIL. Prior cervical treatment: no treatment.  Past Medical History:  Diagnosis Date  . Anemia   . Anxiety   . Asthma   . Blood transfusion without reported diagnosis 2016  . Chiari I malformation ([place])   . Empty sella ([place])   . Irregular heart rhythm   . Menorrhagia     Past Surgical History:  Procedure Laterality Date  . CHOLECYSTECTOMY    . TUBAL LIGATION      Family History  Problem Relation Age of Onset  . Diabetes Father   . Kidney disease Father        failure due to diabetes  . Hypertension Mother   . Heart attack Maternal Grandmother 63    Social History Social History   Tobacco Use  . Smoking status: Former Smoker    Packs/day: 0.10    Years: 2.00    Pack years: 0.20    Types: Cigarettes    Quit date: 05/13/2009    Years since quitting: 11.0  . Smokeless tobacco: Never Used  Substance Use Topics  . Alcohol use: Yes    Alcohol/week: 1.0 standard drink    Types: 1 Glasses of wine per week    Comment: occaional  . Drug use: No    Allergies  Allergen Reactions  . Penicillins Anaphylaxis, Hives and Itching    Has patient had a PCN reaction causing immediate rash, facial/tongue/throat swelling, SOB or lightheadedness with hypotension: Yes Has patient had a PCN reaction causing severe rash involving mucus membranes or skin necrosis: No Has patient had a PCN reaction that required hospitalization No Has patient had a PCN reaction occurring within the last 10 years: Yes If all of the above answers are "NO", then may proceed with Cephalosporin use.   . Shrimp [Shellfish Allergy] Anaphylaxis,  Hives and Itching    Current Outpatient Medications  Medication Sig Dispense Refill  . citalopram (CELEXA) 20 MG tablet Take 1 tablet (20 mg total) by mouth daily. 30 tablet 1  . ferrous sulfate 325 (65 FE) MG tablet Take 1 tablet (325 mg total) by mouth daily with breakfast. 30 tablet 0  . ibuprofen (ADVIL) 600 MG tablet Take 1 tablet (600 mg total) by mouth every 6 (six) hours as needed. 30 tablet 0  . ondansetron (ZOFRAN ODT) 4 MG disintegrating tablet Take 1 tablet (4 mg total) by mouth every 8 (eight) hours as needed for nausea or vomiting. 20 tablet 0  . pantoprazole (PROTONIX) 40 MG tablet Take 1 tablet (40 mg total) by mouth daily. 30 tablet 3   Current Facility-Administered Medications  Medication Dose Route Frequency Provider Last Rate Last Admin  . Acetaminophen CAPS 500 mg  500 mg Oral Once Kinnie Feil, MD        Review of Systems Review of Systems  Genitourinary:       Abnormal PAP  All other systems reviewed and are negative.   Blood pressure 116/62, pulse 74, height 5\' 4"  (1.626 m), weight 220 lb (99.8 kg), last menstrual period 05/17/2020.  Physical Exam Physical Exam Vitals and nursing note reviewed. Exam conducted with a  chaperone present Jarrett Soho [place]).  Constitutional:      Appearance: Normal appearance.  Genitourinary:    Labia:        Right: No rash.        Left: No rash.      Neurological:     Mental Status: She is alert.     Data Reviewed 05/25/20  Assessment    Procedure Details  The risks and benefits of the procedure and Written informed consent obtained.  Speculum placed in vagina and excellent visualization of cervix achieved, cervix swabbed x 3 with acetic acid solution. lugols iodine applied.  ? Abnormal vascularization and reduced lugol's update. See image above.  Specimens: Cervical biopsy at 12, 2 and 3 O'clock with ECC  Complications: none.     Plan    Specimens labelled and sent to Pathology. Return to discuss  Pathology results in 2 weeks.      Breanna Miranda 05/25/2020, 9:29 AM

## 2020-05-30 ENCOUNTER — Telehealth: Payer: Self-pay | Admitting: Family Medicine

## 2020-05-30 NOTE — Telephone Encounter (Signed)
Colposcopy report discussed. As discussed with her, these are changes due to HPV, but no indication that she has cervical cancer or CIN1-3. However, she will need repeat co-testing in a year.  HM updated for yearly PAP.  She will f/u with PCP for further evaluation and f/u. She agreed with the plan.  MILD SQUAMOUS ATYPIA SUGGESTIVE OF KOILOCYTIC EFFECT.

## 2020-06-01 ENCOUNTER — Ambulatory Visit (INDEPENDENT_AMBULATORY_CARE_PROVIDER_SITE_OTHER): Payer: Medicaid Other | Admitting: Family Medicine

## 2020-06-01 ENCOUNTER — Other Ambulatory Visit: Payer: Self-pay

## 2020-06-01 ENCOUNTER — Encounter: Payer: Self-pay | Admitting: Family Medicine

## 2020-06-01 VITALS — BP 110/62 | HR 83 | Ht 64.0 in | Wt 218.0 lb

## 2020-06-01 DIAGNOSIS — M6740 Ganglion, unspecified site: Secondary | ICD-10-CM | POA: Diagnosis not present

## 2020-06-01 DIAGNOSIS — D500 Iron deficiency anemia secondary to blood loss (chronic): Secondary | ICD-10-CM | POA: Diagnosis not present

## 2020-06-01 DIAGNOSIS — K625 Hemorrhage of anus and rectum: Secondary | ICD-10-CM

## 2020-06-01 DIAGNOSIS — F322 Major depressive disorder, single episode, severe without psychotic features: Secondary | ICD-10-CM | POA: Diagnosis not present

## 2020-06-01 DIAGNOSIS — G6289 Other specified polyneuropathies: Secondary | ICD-10-CM

## 2020-06-01 LAB — POCT GLYCOSYLATED HEMOGLOBIN (HGB A1C): Hemoglobin A1C: 5.3 % (ref 4.0–5.6)

## 2020-06-01 NOTE — Patient Instructions (Addendum)
It was nice to meet you today,  We are doing the following things today:  -It looks like you have a small external hemorrhoid.  You can use over-the-counter hemorrhoidal treatments such as Preparation H to help relieve the pain.  This is also associated with constipation so you would benefit by taking a stool softener such as MiraLAX.  You can take 1 capful of MiraLAX a day to help soften up your bowel movements.  -For your rectal bleeding I am also going to get some test to try and further elicit the cause of your bleeding.  If we need to we may order a CT scan of your abdomen in the future  -For your neuropathy I am also ordering some lab work.  If this comes back negative we may do some further testing.  -I will send you a referral to podiatry for your foot pain.  I would like to see you back in 2 to 3 weeks.  Have a great day,  Clemetine Marker, MD

## 2020-06-01 NOTE — Progress Notes (Signed)
SUBJECTIVE:   CHIEF COMPLAINT / HPI:   Depression: started celexa $RemoveBefore'20mg'EvNXCTPWfxAlh$ . She feels like the celexa is making her eat more.  Her mood feels better on Celexa.  Her only issue with it is the increased appetite and weight gain.  She states that when she is depressed she will eat more not less, but is currently feeling less depressed than before she was taking the medication.  She has a therapist set up for January.     Blood in stool: has been ongoing for two weeks.  BRBPR.  No pain or itching.  Also with abdominal pain ongoing for two weeks.  No hx of diverticulosis.  Blood is on tissue and in bowl.  No bleeding with urination.  Lately she has been having to 'push' and since that time she developed the bleeding.  She has a BM every day.  No vomiting.  Not otherwise bleeding.    Currently sexually active.  Last period was nov 24th. She takes tylenol and protonix.  She thinks the protonix is helping her GERD.     Painful bumps on feet:  She states she has "knots" on the tops of both of her feet.  They are painful.  She noticed them a month ago and they have gotten larger since that time.  She also has pain in the arches of her feet.  This also started a month ago.  She switched shoes after the pain started But this hasn't helped.    Paresthesia of the hands: Pain and numbness burning up to the elbows in both hands.  This has been also ongoing for two or three weeks.  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  She does not do a lot of lifting or gripping either.  She does not have a restricted diet such as vegan or vegetarian.  She takes vitamin B12 supplementation.  Patient states she drinks alcohol occasionally, "a glass of wine a few nights a week"  Anemia: Has previously had anemia due to heavy menstrual bleeding.  Currently taking iron every day.  Takes b12, c, d3 daily.  She still has heavy cycles sometimes.    PERTINENT  PMH / PSH: Anemia, menorrhagia  OBJECTIVE:   BP 110/62    Pulse 83   Ht $R'5\' 4"'pJ$  (1.626 m)   Wt 218 lb (98.9 kg)   LMP 05/17/2020 (Approximate)   SpO2 98%   BMI 37.42 kg/m   General: Alert and oriented.  No acute distress. CV: Regular rhythm and rate. Pulmonary: Lungs clear to auscultation bilaterally GI: Soft, nontender, normal bowel sounds.  Small tender external hemorrhoid located on the anterior aspect of the anus.  Nonthrombosed. Neuro: Vibratory sense intact in the hands.  Sensation to pinprick intact and hands bilaterally and Phalen test negative Extremities: Fluctuant, mobile mass on the proximal dorsum of the left foot.  Smaller similar mass on the proximal dorsum of the right foot.  ASSESSMENT/PLAN:   Bright red blood per rectum Appears to be due to hemorrhoid which was visualized on exam, although her complaints of coinciding abdominal pain raises concern for possible other cause such as diverticulosis or IBD.  We will get CBC and CRP and ESR to help rule out inflammatory bowel disease.  Based on findings may get additional imaging such as CT scan of the abdomen or refer to gastroenterology. 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  Patient wants to continue at the current dose we get her other gastrointestinal issues worked up.  She also has therapy starting in January.  She does not wish to change dose or add a different medication at this time. -Follow-up at next visit  Ganglion cyst Appears to be a ganglion cyst based on exam.  Patient would like them removed if possible. -Referral to podiatry  Anemia Patient's menstrual bleeding still heavy at times, but improved.  She is taking her iron daily as well as B12 and vitamin D3.  Now with 2 weeks of rectal bleeding. -CBC, anemia panel -Follow-up at next  visit  Peripheral neuropathy In the hands up to the elbows bilaterally ongoing for a few weeks.  Physical exam normal.  Appears to be possibly nerve entrapment at the elbows, but the bilateral nature of it and her lack of repetitive stress to the area at work raises concern for underlying systemic cause.  Takes B12 supplementation and denies alcohol abuse so these causes are less likely.  May be idiopathic, but will need further work-up. -CMP, B12 with anemia panel     Benay Pike, MD [place]

## 2020-06-02 DIAGNOSIS — G629 Polyneuropathy, unspecified: Secondary | ICD-10-CM | POA: Insufficient documentation

## 2020-06-02 DIAGNOSIS — M6740 Ganglion, unspecified site: Secondary | ICD-10-CM | POA: Insufficient documentation

## 2020-06-02 DIAGNOSIS — K644 Residual hemorrhoidal skin tags: Secondary | ICD-10-CM | POA: Insufficient documentation

## 2020-06-02 DIAGNOSIS — K625 Hemorrhage of anus and rectum: Secondary | ICD-10-CM | POA: Insufficient documentation

## 2020-06-02 LAB — COMPREHENSIVE METABOLIC PANEL
ALT: 16 IU/L (ref 0–32)
AST: 15 IU/L (ref 0–40)
Albumin/Globulin Ratio: 1.4 (ref 1.2–2.2)
Albumin: 4 g/dL (ref 3.8–4.8)
Alkaline Phosphatase: 76 IU/L (ref 44–121)
BUN/Creatinine Ratio: 17 (ref 9–23)
BUN: 15 mg/dL (ref 6–20)
Bilirubin Total: 0.3 mg/dL (ref 0.0–1.2)
CO2: 22 mmol/L (ref 20–29)
Calcium: 9.1 mg/dL (ref 8.7–10.2)
Chloride: 100 mmol/L (ref 96–106)
Creatinine, Ser: 0.86 mg/dL (ref 0.57–1.00)
GFR calc Af Amer: 101 mL/min/{1.73_m2} (ref >59)
GFR calc non Af Amer: 87 mL/min/{1.73_m2} (ref >59)
Globulin, Total: 2.8 g/dL (ref 1.5–4.5)
Glucose: 102 mg/dL — ABNORMAL HIGH (ref 65–99)
Potassium: 4.3 mmol/L (ref 3.5–5.2)
Sodium: 136 mmol/L (ref 134–144)
Total Protein: 6.8 g/dL (ref 6.0–8.5)

## 2020-06-02 LAB — CBC
Hemoglobin: 10.9 g/dL — ABNORMAL LOW (ref 11.1–15.9)
MCH: 22.8 pg — ABNORMAL LOW (ref 26.6–33.0)
MCHC: 30.2 g/dL — ABNORMAL LOW (ref 31.5–35.7)
MCV: 75 fL — ABNORMAL LOW (ref 79–97)
Platelets: 285 10*3/uL (ref 150–450)
RBC: 4.79 x10E6/uL (ref 3.77–5.28)
RDW: 18.5 % — ABNORMAL HIGH (ref 11.7–15.4)
WBC: 4.8 10*3/uL (ref 3.4–10.8)

## 2020-06-02 LAB — ANEMIA PANEL
Ferritin: 20 ng/mL (ref 15–150)
Folate, Hemolysate: 371 ng/mL
Folate, RBC: 1028 ng/mL (ref >498)
Hematocrit: 36.1 % (ref 34.0–46.6)
Iron Saturation: 8 % — CL (ref 15–55)
Iron: 30 ug/dL (ref 27–159)
Retic Ct Pct: 1.6 % (ref 0.6–2.6)
Total Iron Binding Capacity: 379 ug/dL (ref 250–450)
UIBC: 349 ug/dL (ref 131–425)
Vitamin B-12: 585 pg/mL (ref 232–1245)

## 2020-06-02 LAB — C-REACTIVE PROTEIN: CRP: 9 mg/L (ref 0–10)

## 2020-06-02 LAB — SEDIMENTATION RATE: Sed Rate: 26 mm/hr (ref 0–32)

## 2020-06-02 NOTE — Assessment & Plan Note (Signed)
Appears to be a ganglion cyst based on exam.  Patient would like them removed if possible. -Referral to podiatry

## 2020-06-02 NOTE — Assessment & Plan Note (Signed)
Patient's mood is better on the Celexa, but she is about her appetite increase in weight gain.  Patient wants to continue at the current dose we get her other gastrointestinal issues worked up.  She also has therapy starting in January.  She does not wish to change dose or add a different medication at this time. -Follow-up at next visit

## 2020-06-02 NOTE — Assessment & Plan Note (Signed)
In the hands up to the elbows bilaterally ongoing for a few weeks.  Physical exam normal.  Appears to be possibly nerve entrapment at the elbows, but the bilateral nature of it and her lack of repetitive stress to the area at work raises concern for underlying systemic cause.  Takes B12 supplementation and denies alcohol abuse so these causes are less likely.  May be idiopathic, but will need further work-up. -CMP, B12 with anemia panel

## 2020-06-02 NOTE — Assessment & Plan Note (Signed)
Appears to be due to hemorrhoid which was visualized on exam, although her complaints of coinciding abdominal pain raises concern for possible other cause such as diverticulosis or IBD.  We will get CBC and CRP and ESR to help rule out inflammatory bowel disease.  Based on findings may get additional imaging such as CT scan of the abdomen or refer to gastroenterology. -MiraLAX to soften stool -Preparation H or equivalent for symptomatic relief

## 2020-06-02 NOTE — Assessment & Plan Note (Signed)
Patient's menstrual bleeding still heavy at times, but improved.  She is taking her iron daily as well as B12 and vitamin D3.  Now with 2 weeks of rectal bleeding. -CBC, anemia panel -Follow-up at next visit

## 2020-06-19 ENCOUNTER — Ambulatory Visit (INDEPENDENT_AMBULATORY_CARE_PROVIDER_SITE_OTHER): Payer: Medicaid Other

## 2020-06-19 ENCOUNTER — Other Ambulatory Visit: Payer: Self-pay

## 2020-06-19 ENCOUNTER — Ambulatory Visit: Payer: Medicaid Other | Admitting: Podiatry

## 2020-06-19 DIAGNOSIS — M778 Other enthesopathies, not elsewhere classified: Secondary | ICD-10-CM | POA: Diagnosis not present

## 2020-06-19 DIAGNOSIS — M67472 Ganglion, left ankle and foot: Secondary | ICD-10-CM | POA: Diagnosis not present

## 2020-06-19 DIAGNOSIS — M6740 Ganglion, unspecified site: Secondary | ICD-10-CM

## 2020-06-19 DIAGNOSIS — M779 Enthesopathy, unspecified: Secondary | ICD-10-CM | POA: Diagnosis not present

## 2020-06-19 MED ORDER — MELOXICAM 15 MG PO TABS
15.0000 mg | ORAL_TABLET | Freq: Every day | ORAL | 0 refills | Status: DC
Start: 2020-06-19 — End: 2021-04-26

## 2020-06-19 MED ORDER — TRIAMCINOLONE ACETONIDE 10 MG/ML IJ SUSP
10.0000 mg | Freq: Once | INTRAMUSCULAR | Status: AC
  Administered 2020-06-22: 10 mg

## 2020-06-21 LAB — NON-GYN, SPECIMEN A

## 2020-06-21 LAB — CYTOLOGY - NON PAP

## 2020-06-22 DIAGNOSIS — M6740 Ganglion, unspecified site: Secondary | ICD-10-CM

## 2020-06-22 DIAGNOSIS — M779 Enthesopathy, unspecified: Secondary | ICD-10-CM | POA: Diagnosis not present

## 2020-06-22 DIAGNOSIS — M778 Other enthesopathies, not elsewhere classified: Secondary | ICD-10-CM | POA: Diagnosis not present

## 2020-06-22 NOTE — Progress Notes (Signed)
Subjective:   Patient ID: Breanna Miranda, female   DOB: 34 y.o.   MRN: [id number]   HPI 34 year old female presents the office today for concerns of bilateral foot pain. She has a cyst on the left side worse than the right which is causing quite a bit discomfort. This is been ongoing for the last 1 month. She denies any recent injury or trauma to her foot she said has had no recent treatment. She states the pain gets worse after being on her feet for some time.   Review of Systems  All other systems reviewed and are negative.  Past Medical History:  Diagnosis Date  . Anemia   . Anxiety   . Asthma   . Blood transfusion without reported diagnosis 2016  . Chiari I malformation ([facility])   . Empty sella ([facility])   . Irregular heart rhythm   . Menorrhagia     Past Surgical History:  Procedure Laterality Date  . CHOLECYSTECTOMY    . TUBAL LIGATION       Current Outpatient Medications:  .  meloxicam (MOBIC) 15 MG tablet, Take 1 tablet (15 mg total) by mouth daily., Disp: 30 tablet, Rfl: 0 .  citalopram (CELEXA) 20 MG tablet, Take 1 tablet (20 mg total) by mouth daily., Disp: 30 tablet, Rfl: 1 .  ferrous sulfate 325 (65 FE) MG tablet, Take 1 tablet (325 mg total) by mouth daily with breakfast., Disp: 30 tablet, Rfl: 0 .  ibuprofen (ADVIL) 600 MG tablet, Take 1 tablet (600 mg total) by mouth every 6 (six) hours as needed., Disp: 30 tablet, Rfl: 0 .  ondansetron (ZOFRAN ODT) 4 MG disintegrating tablet, Take 1 tablet (4 mg total) by mouth every 8 (eight) hours as needed for nausea or vomiting., Disp: 20 tablet, Rfl: 0 .  pantoprazole (PROTONIX) 40 MG tablet, Take 1 tablet (40 mg total) by mouth daily., Disp: 30 tablet, Rfl: 3  Current Facility-Administered Medications:  .  Acetaminophen CAPS 500 mg, 500 mg, Oral, Once, [place], Kehinde T, MD .  triamcinolone acetonide (KENALOG) 10 MG/ML injection 10 mg, 10 mg, Other, Once, Vivi Barrack, DPM  Allergies  Allergen Reactions  .  Penicillins Anaphylaxis, Hives and Itching    Has patient had a PCN reaction causing immediate rash, facial/tongue/throat swelling, SOB or lightheadedness with hypotension: Yes Has patient had a PCN reaction causing severe rash involving mucus membranes or skin necrosis: No Has patient had a PCN reaction that required hospitalization No Has patient had a PCN reaction occurring within the last 10 years: Yes If all of the above answers are "NO", then may proceed with Cephalosporin use.   . Shrimp [Shellfish Allergy] Anaphylaxis, Hives and Itching        Objective:  Physical Exam  General: AAO x3, NAD  Dermatological: On the dorsal lateral aspect left foot is a fluid-filled soft tissue mass likely ganglion cyst. There are no open sores, no preulcerative lesions, no rash or signs of infection present.  Vascular: Dorsalis Pedis artery and Posterior Tibial artery pedal pulses are 2/4 bilateral with immedate capillary fill time. There is no pain with calf compression, swelling, warmth, erythema.   Neruologic: Grossly intact via light touch bilateral.   Musculoskeletal: There is also areas bilaterally more on the fifth metatarsal cuboid joint bilaterally of bony prominence but there is no cystic formation or fluid identified at this area. There is tenderness palpation of these areas.  Muscular strength 5/5 in all groups tested bilateral.  Gait: Unassisted,  Nonantalgic.       Assessment:   Left foot soft tissue mass, likely ganglion cyst; symptomatic exostosis/ tendinitis bilaterally    Plan:  -Treatment options discussed including all alternatives, risks, and complications -Etiology of symptoms were discussed -X-rays were obtained and reviewed with the patient. No evidence of acute fracture or stress fracture identified today. -In regards to the soft tissue mass I discussed aspiration and she wishes to proceed this understanding risks. Skin was cleaned with alcohol and mixture of 3 cc of  lidocaine, Marcaine plain was infiltrated. Skin was prepped with Betadine. I utilized an 18-gauge needle in order to aspirate clear, gel-like fluid likely ganglion cyst. After aspiration 1 cc Kenalog 10, 1 cc Marcaine plain was infiltrated into the lesion. Compression bandage applied. -Patient injected bilateral areas of exostosis, pain utilizing half cc Kenalog 10, half cc Marcaine plain, half cc lidocaine plain without complications. Maximal tenderness. -Discussed shoe modifications and orthotics. -Mobic prn -If symptoms continue MRI  Trula Slade DPM

## 2020-06-30 ENCOUNTER — Other Ambulatory Visit: Payer: Self-pay

## 2020-06-30 ENCOUNTER — Encounter: Payer: Self-pay | Admitting: Family Medicine

## 2020-06-30 ENCOUNTER — Ambulatory Visit (INDEPENDENT_AMBULATORY_CARE_PROVIDER_SITE_OTHER): Payer: Medicaid Other | Admitting: Family Medicine

## 2020-06-30 DIAGNOSIS — G6289 Other specified polyneuropathies: Secondary | ICD-10-CM | POA: Diagnosis present

## 2020-06-30 DIAGNOSIS — D500 Iron deficiency anemia secondary to blood loss (chronic): Secondary | ICD-10-CM

## 2020-06-30 DIAGNOSIS — F322 Major depressive disorder, single episode, severe without psychotic features: Secondary | ICD-10-CM | POA: Diagnosis not present

## 2020-06-30 DIAGNOSIS — K644 Residual hemorrhoidal skin tags: Secondary | ICD-10-CM | POA: Diagnosis not present

## 2020-06-30 NOTE — Patient Instructions (Signed)
It was nice to see you again today,  I will order an iron fusion for you.  Once this is approved with someone will call you to schedule an appointment.  I would like you to try using the braces shown below on your hands at night.  Wear them at night when you sleep.  You can pick them up over-the-counter at any pharmacy.  I would like to see you back approximately 4 weeks after your infusion.  Have a great day,  Clemetine Marker, MD

## 2020-06-30 NOTE — Assessment & Plan Note (Signed)
Depression not worsened since last visit. Does not desire to change medications.  Encouraged pt to speak with therapist.  Will continue to monitor.

## 2020-06-30 NOTE — Progress Notes (Signed)
    SUBJECTIVE:   CHIEF COMPLAINT / HPI:   Peripheral neuropathy: Patient still complaining of bilateral pain and paresthesia in the hands.  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  She has not tried wrist splinting in the past.  Anemia: Patient takes her iron daily.  Does not feel fatigue.  Does not have any side effects such as constipation or stomach discomfort from taking the iron, but does not want to and decreased to multiple times per day due to concern with her history of hemorrhoids.  Has had iron infusions in the past and is open to having one again.    Depression: Patient states she is "maintaining" since her last visit.  She has an appointment with the therapist scheduled for today but had to come to this appointment and then has to go to work after that so she will need to reschedule.  Does not want to change any of her medications.  Still compliant with the Celexa.  hemorrhoid: Patient's hemorrhoidal pain has subsided.  No complaints currently.    PERTINENT  PMH / PSH: Anemia  OBJECTIVE:   BP 126/72   Pulse 72   Ht 5\' 4"  (1.626 m)   Wt 219 lb (99.3 kg)   LMP 05/30/2020 (Approximate)   BMI 37.59 kg/m   General: Alert and oriented.  No acute distress. CV: Regular rate and rhythm MSK: Hands nontender to palpation bilaterally.  No gross asymmetry or swelling noted.  Tinel and Phalen test negative.  ASSESSMENT/PLAN:   Peripheral neuropathy Still complaining of neuropathy in the hands b/l.  Previous blood tests were normal.  May be carpal tunnel syndrome.  Tinel and phalen tests negative. Advised pt to try b/l wrist braces worn during sleep. Consider nerve conduction studies if this does not improve symptoms.     Anemia Anemia panel showed mildly low hgb with low normal iron and very low iron saturation despite compliance with daily iron supp.  Will order IV iron.  Do not want to increase dosing of oral iron given recent hx of hemorrhoids.   Current  severe episode of major depressive disorder without psychotic features ([place]) Depression not worsened since last visit. Does not desire to change medications.  Encouraged pt to speak with therapist.  Will continue to monitor.   External hemorrhoid Symptoms of pain and bleeding resolved.  Continue to monitor.       Benay Pike, MD [place]

## 2020-06-30 NOTE — Assessment & Plan Note (Signed)
Anemia panel showed mildly low hgb with low normal iron and very low iron saturation despite compliance with daily iron supp.  Will order IV iron.  Do not want to increase dosing of oral iron given recent hx of hemorrhoids.

## 2020-06-30 NOTE — Assessment & Plan Note (Signed)
Symptoms of pain and bleeding resolved.  Continue to monitor.

## 2020-06-30 NOTE — Assessment & Plan Note (Signed)
Still complaining of neuropathy in the hands b/l.  Previous blood tests were normal.  May be carpal tunnel syndrome.  Tinel and phalen tests negative. Advised pt to try b/l wrist braces worn during sleep. Consider nerve conduction studies if this does not improve symptoms.

## 2020-07-03 ENCOUNTER — Other Ambulatory Visit: Payer: Self-pay | Admitting: Family Medicine

## 2020-07-03 ENCOUNTER — Telehealth: Payer: Self-pay | Admitting: *Deleted

## 2020-07-03 MED ORDER — FERUMOXYTOL INJECTION 510 MG/17 ML: 510.0000 mg | Freq: Once | INTRAVENOUS | 0 refills | Status: DC

## 2020-07-03 NOTE — Telephone Encounter (Signed)
Contacted pt to inform her that I have faxed the order for her infusion and she can call and schedule this. Number to schedule was given to pt. Calie Buttrey Zimmerman Rumple, CMA

## 2020-07-06 ENCOUNTER — Ambulatory Visit (HOSPITAL_COMMUNITY)
Admission: EM | Admit: 2020-07-06 | Discharge: 2020-07-06 | Disposition: A | Discharge status: Home / Self Care | Payer: Medicaid Other | Attending: Urgent Care | Admitting: Urgent Care

## 2020-07-06 ENCOUNTER — Other Ambulatory Visit: Payer: Self-pay

## 2020-07-06 ENCOUNTER — Encounter (HOSPITAL_COMMUNITY): Payer: Self-pay

## 2020-07-06 DIAGNOSIS — J069 Acute upper respiratory infection, unspecified: Secondary | ICD-10-CM | POA: Diagnosis not present

## 2020-07-06 DIAGNOSIS — U071 COVID-19: Secondary | ICD-10-CM | POA: Diagnosis not present

## 2020-07-06 LAB — SARS CORONAVIRUS 2 (TAT 6-24 HRS): SARS Coronavirus 2: POSITIVE — AB

## 2020-07-06 MED ORDER — PSEUDOEPHEDRINE HCL 60 MG PO TABS: 60.0000 mg | ORAL_TABLET | Freq: Three times a day (TID) | ORAL | 0 refills | Status: DC | PRN

## 2020-07-06 MED ORDER — BENZONATATE 100 MG PO CAPS: 100.0000 mg | ORAL_CAPSULE | Freq: Three times a day (TID) | ORAL | 0 refills | Status: DC | PRN

## 2020-07-06 MED ORDER — PROMETHAZINE-DM 6.25-15 MG/5ML PO SYRP: 5.0000 mL | ORAL_SOLUTION | Freq: Every evening | ORAL | 0 refills | Status: DC | PRN

## 2020-07-06 MED ORDER — CETIRIZINE HCL 10 MG PO TABS: 10.0000 mg | ORAL_TABLET | Freq: Every day | ORAL | 0 refills | Status: DC

## 2020-07-06 NOTE — Discharge Instructions (Addendum)

## 2020-07-06 NOTE — ED Provider Notes (Signed)
Breanna Miranda [facility]   MRN: [id number] DOB: 26-Oct-1985  Subjective:   Breanna Miranda is a 35 y.o. female presenting for 3-day history of acute onset sinus congestion, cough, chest congestion.  Patient has also been having weakness and body aches, sinus headaches.  She tried Aleve with minimal relief.  The cough can elicit some shortness of breath.  She is not COVID vaccinated.  Patient works in [employer].   Current Facility-Administered Medications:  .  Acetaminophen CAPS 500 mg, 500 mg, Oral, Once, Eniola, Kehinde T, MD  Current Outpatient Medications:  .  citalopram (CELEXA) 20 MG tablet, Take 1 tablet (20 mg total) by mouth daily., Disp: 30 tablet, Rfl: 1 .  ferrous sulfate 325 (65 FE) MG tablet, Take 1 tablet (325 mg total) by mouth daily with breakfast., Disp: 30 tablet, Rfl: 0 .  ferumoxytol (FERAHEME) 510 MG/17ML SOLN injection, Inject 17 mLs (510 mg total) into the vein once for 1 dose., Disp: 17 mL, Rfl: 0 .  ibuprofen (ADVIL) 600 MG tablet, Take 1 tablet (600 mg total) by mouth every 6 (six) hours as needed., Disp: 30 tablet, Rfl: 0 .  meloxicam (MOBIC) 15 MG tablet, Take 1 tablet (15 mg total) by mouth daily., Disp: 30 tablet, Rfl: 0 .  ondansetron (ZOFRAN ODT) 4 MG disintegrating tablet, Take 1 tablet (4 mg total) by mouth every 8 (eight) hours as needed for nausea or vomiting., Disp: 20 tablet, Rfl: 0 .  pantoprazole (PROTONIX) 40 MG tablet, Take 1 tablet (40 mg total) by mouth daily., Disp: 30 tablet, Rfl: 3   Allergies  Allergen Reactions  . Penicillins Anaphylaxis, Hives and Itching    Has patient had a PCN reaction causing immediate rash, facial/tongue/throat swelling, SOB or lightheadedness with hypotension: Yes Has patient had a PCN reaction causing severe rash involving mucus membranes or skin necrosis: No Has patient had a PCN reaction that required hospitalization No Has patient had a PCN reaction occurring within the last 10 years: Yes If  all of the above answers are "NO", then may proceed with Cephalosporin use.   . Shrimp [Shellfish Allergy] Anaphylaxis, Hives and Itching    Past Medical History:  Diagnosis Date  . Anemia   . Anxiety   . Asthma   . Blood transfusion without reported diagnosis 2016  . Chiari I malformation ([place])   . Empty sella ([place])   . Irregular heart rhythm   . Menorrhagia      Past Surgical History:  Procedure Laterality Date  . CHOLECYSTECTOMY    . TUBAL LIGATION      Family History  Problem Relation Age of Onset  . Diabetes Father   . Kidney disease Father        failure due to diabetes  . Hypertension Mother   . Heart attack Maternal Grandmother 63    Social History   Tobacco Use  . Smoking status: Former Smoker    Packs/day: 0.10    Years: 2.00    Pack years: 0.20    Types: Cigarettes    Quit date: 05/25/2007    Years since quitting: 13.1  . Smokeless tobacco: Never Used  Substance Use Topics  . Alcohol use: Yes    Alcohol/week: 1.0 standard drink    Types: 1 Glasses of wine per week    Comment: occaional  . Drug use: No    ROS   Objective:   Vitals: BP 103/65 (BP Location: Left Arm)   Pulse 86  Temp 99.4 F (37.4 C) (Oral)   Resp 17   LMP 07/03/2020 (Approximate)   SpO2 99%   Physical Exam Constitutional:      General: She is not in acute distress.    Appearance: Normal appearance. She is well-developed. She is not ill-appearing, toxic-appearing or diaphoretic.  HENT:     Head: Normocephalic and atraumatic.     Nose: Nose normal.     Mouth/Throat:     Mouth: Mucous membranes are moist.  Eyes:     Extraocular Movements: Extraocular movements intact.     Pupils: Pupils are equal, round, and reactive to light.  Cardiovascular:     Rate and Rhythm: Normal rate and regular rhythm.     Pulses: Normal pulses.     Heart sounds: Normal heart sounds. No murmur heard. No friction rub. No gallop.   Pulmonary:     Effort: Pulmonary effort is normal. No  respiratory distress.     Breath sounds: Normal breath sounds. No stridor. No wheezing, rhonchi or rales.  Skin:    General: Skin is warm and dry.     Findings: No rash.  Neurological:     Mental Status: She is alert and oriented to person, place, and time.  Psychiatric:        Mood and Affect: Mood normal.        Behavior: Behavior normal.        Thought Content: Thought content normal.      Assessment and Plan :   PDMP not reviewed this encounter.  1. Viral URI with cough     Will manage for viral illness such as viral URI, viral syndrome, viral rhinitis, COVID-19. Counseled patient on nature of COVID-19 including modes of transmission, diagnostic testing, management and supportive care.  Offered scripts for symptomatic relief. COVID 19 testing is pending. Counseled patient on potential for adverse effects with medications prescribed/recommended today, ER and return-to-clinic precautions discussed, patient verbalized understanding.     Jaynee Eagles, [place] 07/06/20 [phone]

## 2020-07-06 NOTE — ED Triage Notes (Signed)
Pt c/o a cough, chest congestion, and runny nose x 3 days. Pt states she feels weak, having body aches and headaches X 3 days. She states she took Aleve today. Pt states she is SOB when she coughs and walks.

## 2020-07-10 ENCOUNTER — Ambulatory Visit: Payer: Medicaid Other | Admitting: Podiatry

## 2020-07-10 ENCOUNTER — Other Ambulatory Visit (HOSPITAL_COMMUNITY): Payer: Self-pay | Admitting: *Deleted

## 2020-07-12 ENCOUNTER — Other Ambulatory Visit: Payer: Self-pay

## 2020-07-12 ENCOUNTER — Encounter (HOSPITAL_COMMUNITY)
Admission: RE | Admit: 2020-07-12 | Discharge: 2020-07-12 | Disposition: A | Discharge status: Home / Self Care | Payer: Medicaid Other | Source: Ambulatory Visit | Attending: Internal Medicine | Admitting: Internal Medicine

## 2020-07-12 DIAGNOSIS — D509 Iron deficiency anemia, unspecified: Secondary | ICD-10-CM | POA: Insufficient documentation

## 2020-07-12 MED ORDER — SODIUM CHLORIDE 0.9 % IV SOLN
510.0000 mg | Freq: Once | INTRAVENOUS | Status: AC
  Administered 2020-07-12: 510 mg via INTRAVENOUS
  Filled 2020-07-12: qty 510

## 2020-07-13 ENCOUNTER — Ambulatory Visit (INDEPENDENT_AMBULATORY_CARE_PROVIDER_SITE_OTHER): Payer: Medicaid Other | Admitting: Family Medicine

## 2020-07-13 ENCOUNTER — Other Ambulatory Visit: Payer: Self-pay

## 2020-07-13 ENCOUNTER — Encounter: Payer: Self-pay | Admitting: Family Medicine

## 2020-07-13 VITALS — BP 100/58 | HR 65 | Ht 64.0 in | Wt 214.0 lb

## 2020-07-13 DIAGNOSIS — G6289 Other specified polyneuropathies: Secondary | ICD-10-CM | POA: Diagnosis not present

## 2020-07-13 DIAGNOSIS — D500 Iron deficiency anemia secondary to blood loss (chronic): Secondary | ICD-10-CM

## 2020-07-13 DIAGNOSIS — U071 COVID-19: Secondary | ICD-10-CM

## 2020-07-13 DIAGNOSIS — Z23 Encounter for immunization: Secondary | ICD-10-CM | POA: Diagnosis not present

## 2020-07-13 DIAGNOSIS — Z020 Encounter for examination for admission to educational institution: Secondary | ICD-10-CM | POA: Diagnosis not present

## 2020-07-13 DIAGNOSIS — Z111 Encounter for screening for respiratory tuberculosis: Secondary | ICD-10-CM | POA: Diagnosis not present

## 2020-07-13 MED ORDER — TRIAMCINOLONE ACETONIDE 0.1 % EX OINT: 1.0000 "application " | TOPICAL_OINTMENT | Freq: Two times a day (BID) | CUTANEOUS | 1 refills | Status: DC

## 2020-07-13 MED ORDER — PANTOPRAZOLE SODIUM 20 MG PO TBEC: 20.0000 mg | DELAYED_RELEASE_TABLET | Freq: Every day | ORAL | 1 refills | Status: DC

## 2020-07-13 NOTE — Patient Instructions (Signed)
I would like you to take 20mg  of protonix instead of 40mg .  I have sent in a new prescription. If it is not helping, you can go back to the 40mg .    I have sent in a prescription for a cream to rub on your hands for your dry, itchy skin.  Please use it twice a day for two weeks.  After two weeks you can continue to use it twice a week.    I will call you when the results of your tests are in so you can pick up your paperwork.    Clemetine Marker, MD

## 2020-07-13 NOTE — Progress Notes (Signed)
° ° °  SUBJECTIVE:   CHIEF COMPLAINT / HPI:   Neuropathy: Patient using her wrist splints at night and feels like this is helping her pain somewhat.  We discussed the option of minor surgery if her symptoms continue to bother her with conservative management   Anemia: Patient had her IV iron infusion yesterday.  Still taking her oral iron medication.  Cannot tell any difference so far as it was only less than 1 day ago.    covid: Patient tested positive last week for COVID.  She states she got it at work.  She states this is her third time having it.  She has not been vaccinated yet, but plans to get the vaccine next Monday.  Symptoms have improved from this latest infection.  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  PERTINENT  PMH / PSH: Anemia, neuropathy  OBJECTIVE:   BP (!) 100/58    Pulse 65    Ht 5\' 4"  (1.626 m)    Wt 214 lb (97.1 kg)    LMP 07/03/2020 (Approximate)    SpO2 99%    BMI 36.73 kg/m   General: Alert, oriented.  No acute distress HEENT: PERRLA, EOMI, moist oral mucosa CV: Regular rate and rhythm Pulmonary: Lungs clear to auscultation bilaterally MSK: 5/5 strength upper and lower extremities bilaterally  ASSESSMENT/PLAN:   Peripheral neuropathy Symptoms improving with night splints on her wrist.  Likely carpal tunnel.  Discussed avoiding overuse of the hands and wrists and surgical options if conservative management fails.  Anemia Patient had her IV infusion yesterday.  Still taking oral iron.  Continue to monitor.  COVID Has now tested positive for COVID three different times.  Likely has been able to avoid hospitalization and has had mild to moderate symptoms thus far.  Patient is unvaccinated but plans to get the vaccine next Monday.  Encouraged patient to follow through with getting vaccinated.     Benay Pike, MD [place]

## 2020-07-14 DIAGNOSIS — U071 COVID-19: Secondary | ICD-10-CM | POA: Insufficient documentation

## 2020-07-14 HISTORY — DX: COVID-19: U07.1

## 2020-07-14 LAB — HEPATITIS B SURFACE ANTIBODY, QUANTITATIVE: Hepatitis B Surf Ab Quant: 16.4 m[IU]/mL (ref >9.9)

## 2020-07-14 NOTE — Assessment & Plan Note (Signed)
Symptoms improving with night splints on her wrist.  Likely carpal tunnel.  Discussed avoiding overuse of the hands and wrists and surgical options if conservative management fails.

## 2020-07-14 NOTE — Assessment & Plan Note (Signed)
Has now tested positive for COVID three different times.  Likely has been able to avoid hospitalization and has had mild to moderate symptoms thus far.  Patient is unvaccinated but plans to get the vaccine next Monday.  Encouraged patient to follow through with getting vaccinated.

## 2020-07-14 NOTE — Assessment & Plan Note (Signed)
Patient had her IV infusion yesterday.  Still taking oral iron.  Continue to monitor.

## 2020-07-15 LAB — VARICELLA ZOSTER ANTIBODY, IGG: Varicella zoster IgG: 1914 index (ref >165)

## 2020-07-15 LAB — MEASLES/MUMPS/RUBELLA IMMUNITY
MUMPS ABS, IGG: <9 AU/mL — ABNORMAL LOW (ref >10.9)
RUBEOLA AB, IGG: 115 AU/mL (ref >16.4)
Rubella Antibodies, IGG: 0.98 index — ABNORMAL LOW (ref >0.99)

## 2020-07-15 LAB — QUANTIFERON-TB GOLD PLUS
QuantiFERON Mitogen Value: >10 IU/mL
QuantiFERON Nil Value: 0 IU/mL
QuantiFERON TB1 Ag Value: 0.02 IU/mL
QuantiFERON TB2 Ag Value: 0.01 IU/mL
QuantiFERON-TB Gold Plus: NEGATIVE

## 2020-07-17 ENCOUNTER — Telehealth: Payer: Self-pay | Admitting: Family Medicine

## 2020-07-17 NOTE — Telephone Encounter (Signed)
Can we call this patient so she can schedule a lab visit to get her MMR vaccine?  She had immunity titers performed and she was found to be rubella non-immune.  I have her paperwork in my box but she wi

## 2020-07-21 NOTE — Telephone Encounter (Signed)
Yes she needs a lab visit to get her vaccination.

## 2020-07-25 NOTE — Telephone Encounter (Signed)
I have tried calling her at least twice and there is no answer and I cannot leave a voicemail.

## 2020-07-27 NOTE — Progress Notes (Signed)
Patient was a no-show to her February 4 appointment with family practice center.  Was contacted at the number in the chart [phone].  No answer, unable to leave voicemail as mailbox is full and is unable to accept messages.  Patient was sent a letter in the mail regarding no-show/late cancellation policy.  Milus Banister, [place], PGY-3 07/28/2020 10:04 AM

## 2020-07-28 ENCOUNTER — Ambulatory Visit (INDEPENDENT_AMBULATORY_CARE_PROVIDER_SITE_OTHER): Payer: Medicaid Other | Admitting: Family Medicine

## 2020-07-28 DIAGNOSIS — Z91199 Patient's noncompliance with other medical treatment and regimen due to unspecified reason: Secondary | ICD-10-CM | POA: Insufficient documentation

## 2020-07-28 DIAGNOSIS — Z5329 Procedure and treatment not carried out because of patient's decision for other reasons: Secondary | ICD-10-CM

## 2020-08-04 ENCOUNTER — Encounter: Payer: Self-pay | Admitting: Family Medicine

## 2020-08-04 ENCOUNTER — Ambulatory Visit (INDEPENDENT_AMBULATORY_CARE_PROVIDER_SITE_OTHER): Payer: Medicaid Other | Admitting: Family Medicine

## 2020-08-04 ENCOUNTER — Other Ambulatory Visit: Payer: Self-pay

## 2020-08-04 ENCOUNTER — Ambulatory Visit: Payer: Medicaid Other | Admitting: Family Medicine

## 2020-08-04 VITALS — BP 100/62 | HR 70 | Wt 220.2 lb

## 2020-08-04 DIAGNOSIS — S2002XA Contusion of left breast, initial encounter: Secondary | ICD-10-CM

## 2020-08-04 DIAGNOSIS — N644 Mastodynia: Secondary | ICD-10-CM | POA: Diagnosis present

## 2020-08-04 DIAGNOSIS — S2000XA Contusion of breast, unspecified breast, initial encounter: Secondary | ICD-10-CM | POA: Insufficient documentation

## 2020-08-04 NOTE — Patient Instructions (Signed)
It was wonderful to see you today.  Please continue to monitor this area, hopefully the soreness will continue to improve over the next week or so.  You can place ice or heat on your breast for 20 minutes at a time several times a day to help with this.  I have low concern that this is any sign of breast cancer.  If this bruising recurs or you feel a difference in your breast with any nipple changes or lump please make sure that you follow-up.

## 2020-08-04 NOTE — Telephone Encounter (Signed)
Patient is scheduled for 02/14 for MMR vaccine in nurse clinic. Do we have her paperwork ready? She would like to pick this up on Monday.

## 2020-08-04 NOTE — Assessment & Plan Note (Signed)
Largely resolved with some residual left breast soreness.  Unclear etiology, may have had injury to the area without remembering or during her sleep.  Through her history does not sound consistent with mastitis especially as it significantly improved without intervention.  Doubt breast cancer as this is what the patient was most concerned for and provided reassurance, breast exam reassuring without any nodules or lumps palpated.  Recommended observation and use of ice/heat as needed for continued soreness.

## 2020-08-04 NOTE — Progress Notes (Signed)
    SUBJECTIVE:   CHIEF COMPLAINT / HPI: Bruise on breast  Breanna Miranda is a 35 year old female presenting for evaluation of a bruise on her left breast.  She reports initially noticing this on Monday with concurrent breast soreness around the area.  She does not remember any preceding injury or trauma.  Stated looked very much like a bruise rather than a rash.  It is slowly gotten smaller every single day and now is barely present today.  Still feeling sore around his left breast but less than previous.  She denies any associated fever, nipple discharge, warmth to touch, palpation of the lump, bruising elsewhere, or lymphadenopathy.  She has no personal or family history of breast cancer.  She is not breast-feeding.  LMP the middle of last month, due to start soon.  No personal history of easy bruising or bleeding disorder.  She is most concerned for breast cancer today and wanted to make sure everything was okay.  PERTINENT  PMH / PSH: Hemorrhoids, GERD, dysfunctional uterine bleeding, history of Covid approximately in 06/2020, neuropathy  OBJECTIVE:   BP 100/62   Pulse 70   Wt 220 lb 3.2 oz (99.9 kg)   SpO2 99%   BMI 37.80 kg/m   General: Alert, NAD HEENT: NCAT, MMM Lungs: No increased WOB  Breasts:  --right breast normal without mass, skin, nipple changes, or axillary nodes.  --left breast normal without mass nipple changes or axillary nodes. Left breast is generally sore to palpation predominantly on outer quadrants and over previous bruised area.  No ecchymoses seen, with the exception of small few millimeter bruise remaining.  No abrasions. Ext: Warm, dry  ASSESSMENT/PLAN:   Bruise of breast Largely resolved with some residual left breast soreness.  Unclear etiology, may have had injury to the area without remembering or during her sleep.  Through her history does not sound consistent with mastitis especially as it significantly improved without intervention.  Doubt breast cancer  as this is what the patient was most concerned for and provided reassurance, breast exam reassuring without any nodules or lumps palpated.  Recommended observation and use of ice/heat as needed for continued soreness.     Follow-up if area recurs or develops fever, rash, nipple changes, or palpation of lump.  Consider breast U/S at that time.  Patriciaann Clan, [place]

## 2020-08-07 ENCOUNTER — Ambulatory Visit: Payer: Medicaid Other

## 2020-08-14 ENCOUNTER — Ambulatory Visit: Payer: Medicaid Other

## 2020-08-22 NOTE — Telephone Encounter (Signed)
Patient has no showed last 2 nurse apts for MMR Vaccine. I have her school form in purple folder in RN room for when she does come in for vaccine.

## 2020-10-05 ENCOUNTER — Ambulatory Visit: Payer: Medicaid Other | Admitting: Podiatry

## 2020-10-05 ENCOUNTER — Other Ambulatory Visit: Payer: Self-pay

## 2020-10-05 DIAGNOSIS — G5763 Lesion of plantar nerve, bilateral lower limbs: Secondary | ICD-10-CM

## 2020-10-09 ENCOUNTER — Other Ambulatory Visit: Payer: Self-pay | Admitting: Family Medicine

## 2020-10-09 ENCOUNTER — Ambulatory Visit (INDEPENDENT_AMBULATORY_CARE_PROVIDER_SITE_OTHER): Payer: Medicaid Other | Admitting: Family Medicine

## 2020-10-09 ENCOUNTER — Other Ambulatory Visit: Payer: Self-pay

## 2020-10-09 ENCOUNTER — Encounter: Payer: Self-pay | Admitting: Family Medicine

## 2020-10-09 VITALS — BP 110/62 | HR 82 | Ht 64.0 in | Wt 218.8 lb

## 2020-10-09 DIAGNOSIS — M546 Pain in thoracic spine: Secondary | ICD-10-CM | POA: Diagnosis not present

## 2020-10-09 DIAGNOSIS — Z23 Encounter for immunization: Secondary | ICD-10-CM | POA: Diagnosis not present

## 2020-10-09 DIAGNOSIS — N644 Mastodynia: Secondary | ICD-10-CM

## 2020-10-09 DIAGNOSIS — G8929 Other chronic pain: Secondary | ICD-10-CM | POA: Diagnosis not present

## 2020-10-09 LAB — POCT URINE PREGNANCY: Preg Test, Ur: NEGATIVE

## 2020-10-09 MED ORDER — METRONIDAZOLE 500 MG PO TABS: 500.0000 mg | ORAL_TABLET | Freq: Two times a day (BID) | ORAL | 0 refills | Status: DC

## 2020-10-09 NOTE — Patient Instructions (Signed)
It was nice to see you again today,  For your breast pain I will put in a referral to get a mammogram.  I will follow up with results.  For your back pain I have given you a sheet with some stretches on it that I would like you to do a few times every day.  You can also put Voltaren gel which is available over-the-counter on the area 4 times a day.  I would also continue taking Tylenol as needed.  You can take 1000 mg of Tylenol 3 times a day.  Please follow-up in approximately 1 month.  Have a great day,  Clemetine Marker, MD

## 2020-10-09 NOTE — Progress Notes (Signed)
    SUBJECTIVE:   CHIEF COMPLAINT / HPI:   Having back pain for the past several months. It is in her Upper back, between the shoulder blades.there is an occasional throbbing and burning that goes down the back of the left arm. Worse at night. Takes tylenol or goody powder for pain.  Puts icy hot on it and uses a heating pad as well. Has been doing forward bend stretches.   Left breast pain: constant pain for the past several months. no nipple discharge.  Had a bruise on her left breast the last time she was here.  Pain is in the Same area every time: upper outer quadrant of the breast.  No sweeling in armpint. No family hx of breast cancer.    PERTINENT  PMH / PSH:   OBJECTIVE:   BP 110/62   Pulse 82   Ht 5\' 4"  (1.626 m)   Wt 218 lb 12.8 oz (99.2 kg)   LMP 10/05/2020 (Exact Date)   SpO2 99%   BMI 37.56 kg/m   Gen: alert, oriented. No acute distress.  CV: RRR.  Pulm: lctab MSK: midline tenderness near T2.  Also tender in the muscle tissue immediately to the left of midline.  Normal empty can and hawkins tests.  Pain in the area of tenderness with resisted abduction  Breast: large pendulous breasts, roughly symmetrical in size and shape.  No nipple discharge.  No skin changes.  Tenderness in the upper outer quadrant of the left breast.  Some areas of diffuse fibrotic hardening of both breasts.  Left breast at the upper outer quadrant and right breast at the area just lateral to the areola.   ASSESSMENT/PLAN:   Thoracic back pain Midthoracic back pain that is midline and left-sided.  Appears to be muscular.  Likely worsened by breast size. Not concerning for compression fracture.  Advised pt to use tylenol and voltaren gel as needed. Use heat to the area and showed pt how to do stretching and exercises to help with pain.    Breast pain No bruising today.  Exam positive for tenderness and areas of fibrotic tissue. Will send in order for diagnostic breast exam.        Benay Pike, MD [place]

## 2020-10-09 NOTE — Assessment & Plan Note (Addendum)
Midthoracic back pain that is midline and left-sided.  Appears to be muscular.  Likely worsened by breast size. Not concerning for compression fracture.  Advised pt to use tylenol and voltaren gel as needed. Use heat to the area and showed pt how to do stretching and exercises to help with pain.

## 2020-10-10 ENCOUNTER — Encounter: Payer: Self-pay | Admitting: Podiatry

## 2020-10-10 LAB — CBC
Hematocrit: 32.4 % — ABNORMAL LOW (ref 34.0–46.6)
Hemoglobin: 10.5 g/dL — ABNORMAL LOW (ref 11.1–15.9)
MCH: 25.4 pg — ABNORMAL LOW (ref 26.6–33.0)
MCHC: 32.4 g/dL (ref 31.5–35.7)
MCV: 79 fL (ref 79–97)
Platelets: 297 10*3/uL (ref 150–450)
RBC: 4.13 x10E6/uL (ref 3.77–5.28)
RDW: 13.9 % (ref 11.7–15.4)
WBC: 5.8 10*3/uL (ref 3.4–10.8)

## 2020-10-10 NOTE — Progress Notes (Signed)
Subjective:  Patient ID: Breanna Miranda, female    DOB: 1985-08-13,  MRN: [id number]  Chief Complaint  Patient presents with  . Foot Pain    PT stated that her pain has came back and she is having some pain in her heels     35 y.o. female presents with the above complaint.  Patient presents with a new complaint of bilateral forefoot pain.  Patient states is been going on for quite some time.  There is throbbing burning sensation.  Right is much greater than left side.  She states that she has difficulty ambulating.  She was seen in the past at this office for different complaint but similarly started the same way.  She has not tried any treatment options she denies any other acute complaints.   Review of Systems: Negative except as noted in the HPI. Denies N/V/F/Ch.  Past Medical History:  Diagnosis Date  . Anemia   . Anxiety   . Asthma   . Blood transfusion without reported diagnosis 2016  . Chiari I malformation ([place])   . Empty sella ([place])   . Irregular heart rhythm   . Menorrhagia     Current Outpatient Medications:  .  benzonatate (TESSALON) 100 MG capsule, Take 1-2 capsules (100-200 mg total) by mouth 3 (three) times daily as needed for cough., Disp: 60 capsule, Rfl: 0 .  cetirizine (ZYRTEC ALLERGY) 10 MG tablet, Take 1 tablet (10 mg total) by mouth daily., Disp: 30 tablet, Rfl: 0 .  citalopram (CELEXA) 20 MG tablet, Take 1 tablet (20 mg total) by mouth daily., Disp: 30 tablet, Rfl: 1 .  ferrous sulfate 325 (65 FE) MG tablet, Take 1 tablet (325 mg total) by mouth daily with breakfast., Disp: 30 tablet, Rfl: 0 .  ferumoxytol (FERAHEME) 510 MG/17ML SOLN injection, Inject 17 mLs (510 mg total) into the vein once for 1 dose., Disp: 17 mL, Rfl: 0 .  ibuprofen (ADVIL) 600 MG tablet, Take 1 tablet (600 mg total) by mouth every 6 (six) hours as needed., Disp: 30 tablet, Rfl: 0 .  meloxicam (MOBIC) 15 MG tablet, Take 1 tablet (15 mg total) by mouth daily., Disp: 30 tablet, Rfl:  0 .  metroNIDAZOLE (FLAGYL) 500 MG tablet, Take 1 tablet (500 mg total) by mouth 2 (two) times daily., Disp: 14 tablet, Rfl: 0 .  ondansetron (ZOFRAN ODT) 4 MG disintegrating tablet, Take 1 tablet (4 mg total) by mouth every 8 (eight) hours as needed for nausea or vomiting., Disp: 20 tablet, Rfl: 0 .  pantoprazole (PROTONIX) 20 MG tablet, Take 1 tablet (20 mg total) by mouth daily., Disp: 90 tablet, Rfl: 1 .  promethazine-dextromethorphan (PROMETHAZINE-DM) 6.25-15 MG/5ML syrup, Take 5 mLs by mouth at bedtime as needed for cough., Disp: 100 mL, Rfl: 0 .  pseudoephedrine (SUDAFED) 60 MG tablet, Take 1 tablet (60 mg total) by mouth every 8 (eight) hours as needed for congestion., Disp: 30 tablet, Rfl: 0 .  triamcinolone ointment (KENALOG) 0.1 %, Apply 1 application topically 2 (two) times daily., Disp: 30 g, Rfl: 1  Current Facility-Administered Medications:  .  Acetaminophen CAPS 500 mg, 500 mg, Oral, Once, En[place]ola, Phill Myron, MD  Social History   Tobacco Use  Smoking Status Former Smoker  . Packs/day: 0.10  . Years: 2.00  . Pack years: 0.20  . Types: Cigarettes  . Quit date: 05/25/2007  . Years since quitting: 13.3  Smokeless Tobacco Never Used    Allergies  Allergen Reactions  . Penicillins Anaphylaxis,  Hives and Itching    Has patient had a PCN reaction causing immediate rash, facial/tongue/throat swelling, SOB or lightheadedness with hypotension: Yes Has patient had a PCN reaction causing severe rash involving mucus membranes or skin necrosis: No Has patient had a PCN reaction that required hospitalization No Has patient had a PCN reaction occurring within the last 10 years: Yes If all of the above answers are "NO", then may proceed with Cephalosporin use.   . Shrimp [Shellfish Allergy] Anaphylaxis, Hives and Itching   Objective:  There were no vitals filed for this visit. There is no height or weight on file to calculate BMI. Constitutional Well developed. Well nourished.   Vascular Dorsalis pedis pulses palpable bilaterally. Posterior tibial pulses palpable bilaterally. Capillary refill normal to all digits.  No cyanosis or clubbing noted. Pedal hair growth normal.  Neurologic Normal speech. Oriented to person, place, and time. Epicritic sensation to light touch grossly present bilaterally.  Dermatologic Nails well groomed and normal in appearance. No open wounds. No skin lesions.  Orthopedic:  Pain on palpation to the ball of the foot.  No plantar fat pad atrophy noted.  The findings were consistent bilaterally.  No pain in the intermetatarsal spaces.  No signs of neuroma.  No signs of capsulitis due to the each of the metatarsophalangeal joint.   Radiographs: None Assessment:   1. Morton metatarsalgia, bilateral    Plan:  Patient was evaluated and treated and all questions answered.  Bilateral metatarsalgia right greater than left -I explained to the patient the etiology of metatarsalgia and various treatment options were extensively discussed.  Given the patient's daily activity requires constant pressure on her feet.  I discussed shoe gear modification with incorporation of metatarsal pad.  Metatarsal pads were dispensed.  Given the amount of pain she is having with the right being much greater than left side.  She will also benefit from cam boot immobilization to allow the soft tissue to heal appropriately. -Cam boot was dispensed  No follow-ups on file.

## 2020-10-10 NOTE — Assessment & Plan Note (Signed)
No bruising today.  Exam positive for tenderness and areas of fibrotic tissue. Will send in order for diagnostic breast exam.

## 2020-10-23 DIAGNOSIS — Z1231 Encounter for screening mammogram for malignant neoplasm of breast: Secondary | ICD-10-CM

## 2020-11-17 ENCOUNTER — Ambulatory Visit (HOSPITAL_COMMUNITY)
Admission: EM | Admit: 2020-11-17 | Discharge: 2020-11-17 | Disposition: A | Discharge status: Home / Self Care | Payer: Medicaid Other | Attending: Emergency Medicine | Admitting: Emergency Medicine

## 2020-11-17 ENCOUNTER — Inpatient Hospital Stay: Admission: RE | Admit: 2020-11-17 | Payer: Medicaid Other | Source: Ambulatory Visit

## 2020-11-17 ENCOUNTER — Other Ambulatory Visit: Payer: Self-pay

## 2020-11-17 ENCOUNTER — Ambulatory Visit: Payer: Medicaid Other

## 2020-11-17 ENCOUNTER — Encounter (HOSPITAL_COMMUNITY): Payer: Self-pay

## 2020-11-17 DIAGNOSIS — K0889 Other specified disorders of teeth and supporting structures: Secondary | ICD-10-CM | POA: Diagnosis not present

## 2020-11-17 MED ORDER — IBUPROFEN 800 MG PO TABS: 800.0000 mg | ORAL_TABLET | Freq: Three times a day (TID) | ORAL | 0 refills | Status: DC

## 2020-11-17 MED ORDER — HYDROCODONE-ACETAMINOPHEN 5-325 MG PO TABS: 1.0000 | ORAL_TABLET | Freq: Four times a day (QID) | ORAL | 0 refills | Status: DC | PRN

## 2020-11-17 MED ORDER — CLINDAMYCIN HCL 150 MG PO CAPS: 450.0000 mg | ORAL_CAPSULE | Freq: Three times a day (TID) | ORAL | 0 refills | Status: AC

## 2020-11-17 NOTE — Discharge Instructions (Signed)
Aleve, or ibuprofen, but not both, as needed for pain. Heat packs to your face or ice, either, may be helpful.  Complete course of antibiotics.  Hydrocodone for breakthrough pain as needed. May cause drowsiness. Please do not take if driving or drinking alcohol.   Follow up with your dentist for definitive treatment.

## 2020-11-17 NOTE — ED Provider Notes (Signed)
[place]    CSN: [id number] Arrival date & time: 11/17/20  1405      History   Chief Complaint Chief Complaint  Patient presents with  . Dental Pain    HPI Breanna Miranda is a 35 y.o. female.   Breanna Miranda presents with complaints of right upper tooth pain which worsened today. States she has known needs for dental procedures. No known fever. Pain extends to cheek as well. Took aleve prior to arrival with minimal relief.    ROS per HPI, negative if not otherwise mentioned.      Past Medical History:  Diagnosis Date  . Anemia   . Anxiety   . Asthma   . Blood transfusion without reported diagnosis 2016  . Chiari I malformation ([place])   . Empty sella ([place])   . Irregular heart rhythm   . Menorrhagia     Patient Active Problem List   Diagnosis Date Noted  . Thoracic back pain 10/09/2020  . Breast pain 08/04/2020  . No-show for appointment 07/28/2020  . COVID 07/14/2020  . External hemorrhoid 06/02/2020  . Peripheral neuropathy 06/02/2020  . Ganglion cyst 06/02/2020  . 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 Gastroesophageal reflux disease without esophagitis 04/25/2020  . DUB (dysfunctional uterine bleeding) 01/15/2018  . Fibroid uterus 01/15/2018  . 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 Insomnia 10/15/2017  . Symptomatic anemia 09/21/2017  . Pica in adults   . Chest pain 08/15/2016  . Tachycardia 08/15/2016  . Hyperglycemia 08/15/2016  . Syncope 08/12/2014  . Menorrhagia 08/10/2014  . Anxiety 08/10/2014  . Anemia 05/06/2012    Past Surgical History:  Procedure Laterality Date  . CHOLECYSTECTOMY    . TUBAL LIGATION      OB History    Gravida  5   Para  4   Term  2   Preterm  2   AB  1   Living  4     SAB  1   IAB  0   Ectopic  0   Multiple  0   Live Births  4            Home Medications     Prior to Admission medications   Medication Sig Start Date End Date Taking? Authorizing Provider  clindamycin (CLEOCIN) 150 MG capsule Take 3 capsules (450 mg total) by mouth 3 (three) times daily for 7 days. 11/17/20 11/24/20 Yes Dakarri Kessinger, Malachy Moan, NP  HYDROcodone-acetaminophen (NORCO/VICODIN) 5-325 MG tablet Take 1 tablet by mouth every 6 (six) hours as needed. 11/17/20  Yes Jaimen Melone, Lanelle Bal B, NP  ibuprofen (ADVIL) 800 MG tablet Take 1 tablet (800 mg total) by mouth 3 (three) times daily. 11/17/20  Yes Yitzhak Awan, Malachy Moan, NP  benzonatate (TESSALON) 100 MG capsule Take 1-2 capsules (100-200 mg total) by mouth 3 (three) times daily as needed for cough. 07/06/20   Jaynee Eagles, PA-C  cetirizine (ZYRTEC ALLERGY) 10 MG tablet Take 1 tablet (10 mg total) by mouth daily. 07/06/20   Jaynee Eagles, PA-C  citalopram (CELEXA) 20 MG tablet Take 1 tablet (20 mg total) by mouth daily. 04/24/20   Mullis, Kiersten P, DO  ferrous sulfate 325 (65 FE) MG tablet Take 1 tablet (325 mg total) by mouth daily with breakfast. 09/23/17   Everrett Coombe, MD  ferumoxytol [facility]) 510 MG/17ML SOLN injection Inject 17 mLs (510 mg total) into the vein once  for 1 dose. 07/03/20 07/03/20  Benay Pike, MD  meloxicam (MOBIC) 15 MG tablet Take 1 tablet (15 mg total) by mouth daily. 06/19/20 06/19/21  Trula Slade, DPM  metroNIDAZOLE (FLAGYL) 500 MG tablet Take 1 tablet (500 mg total) by mouth 2 (two) times daily. 10/09/20   Benay Pike, MD  ondansetron (ZOFRAN ODT) 4 MG disintegrating tablet Take 1 tablet (4 mg total) by mouth every 8 (eight) hours as needed for nausea or vomiting. 08/02/19   Chase Picket, MD  pantoprazole (PROTONIX) 20 MG tablet Take 1 tablet (20 mg total) by mouth daily. 07/13/20   Benay Pike, MD  promethazine-dextromethorphan (PROMETHAZINE-DM) 6.25-15 MG/5ML syrup Take 5 mLs by mouth at bedtime as needed for cough. 07/06/20   Jaynee Eagles, PA-C  pseudoephedrine (SUDAFED) 60 MG tablet Take 1 tablet (60 mg total)  by mouth every 8 (eight) hours as needed for congestion. 07/06/20   Jaynee Eagles, PA-C  triamcinolone ointment (KENALOG) 0.1 % Apply 1 application topically 2 (two) times daily. 07/13/20   Benay Pike, MD  fluticasone [facility]) 50 MCG/ACT nasal spray Place 2 sprays into both nostrils daily. 05/03/18 08/02/19  Wurst, Marye Round, PA-C  montelukast (SINGULAIR) 10 MG tablet Take 1 tablet (10 mg total) by mouth at bedtime. 05/12/18 08/02/19  Kinnie Feil, MD    Family History Family History  Problem Relation Age of Onset  . Diabetes Father   . Kidney disease Father        failure due to diabetes  . Hypertension Mother   . Heart attack Maternal Grandmother 63    Social History Social History   Tobacco Use  . Smoking status: Former Smoker    Packs/day: 0.10    Years: 2.00    Pack years: 0.20    Types: Cigarettes    Quit date: 05/25/2007    Years since quitting: 13.4  . Smokeless tobacco: Never Used  Substance Use Topics  . Alcohol use: Yes    Alcohol/week: 1.0 standard drink    Types: 1 Glasses of wine per week    Comment: occaional  . Drug use: No     Allergies   Penicillins and Shrimp [shellfish allergy]   Review of Systems Review of Systems   Physical Exam Triage Vital Signs ED Triage Vitals  Enc Vitals Group     BP 11/17/20 1441 110/66     Pulse Rate 11/17/20 1441 69     Resp 11/17/20 1441 18     Temp 11/17/20 1441 98.2 F (36.8 C)     Temp Source 11/17/20 1441 Oral     SpO2 11/17/20 1441 98 %     Weight --      Height --      Head Circumference --      Peak Flow --      Pain Score 11/17/20 1440 10     Pain Loc --      Pain Edu? --      Excl. in [place]? --    No data found.  Updated Vital Signs BP 110/66 (BP Location: Left Arm)   Pulse 69   Temp 98.2 F (36.8 C) (Oral)   Resp 18   LMP  (Within Months) Comment: 1 month   SpO2 98%   Visual Acuity Right Eye Distance:   Left Eye Distance:   Bilateral Distance:    Right Eye Near:   Left Eye Near:     Bilateral Near:  Physical Exam Constitutional:      General: She is not in acute distress.    Appearance: She is well-developed.  HENT:     Mouth/Throat:     Dentition: Dental tenderness and gingival swelling present.     Comments: Pain and gingival swelling and tenderness to right canine and superior to this without obvious visible abscess Cardiovascular:     Rate and Rhythm: Normal rate.  Pulmonary:     Effort: Pulmonary effort is normal.  Skin:    General: Skin is warm and dry.  Neurological:     Mental Status: She is alert and oriented to person, place, and time.      UC Treatments / Results  Labs (all labs ordered are listed, but only abnormal results are displayed) Labs Reviewed - No data to display  EKG   Radiology No results found.  Procedures Procedures (including critical care time)  Medications Ordered in UC Medications - No data to display  Initial Impression / Assessment and Plan / UC Course  I have reviewed the triage vital signs and the nursing notes.  Pertinent labs & imaging results that were available during my care of the patient were reviewed by me and considered in my medical decision making (see chart for details).     Dental pain, likely abscess. Antibiotics provided today with dental follow up recommended. Patient verbalized understanding and agreeable to plan.   Final Clinical Impressions(s) / UC Diagnoses   Final diagnoses:  Pain, dental     Discharge Instructions     Aleve, or ibuprofen, but not both, as needed for pain. Heat packs to your face or ice, either, may be helpful.  Complete course of antibiotics.  Hydrocodone for breakthrough pain as needed. May cause drowsiness. Please do not take if driving or drinking alcohol.   Follow up with your dentist for definitive treatment.    ED Prescriptions    Medication Sig Dispense Auth. Provider   clindamycin (CLEOCIN) 150 MG capsule Take 3 capsules (450 mg total) by mouth 3  (three) times daily for 7 days. 63 capsule Augusto Gamble B, NP   ibuprofen (ADVIL) 800 MG tablet Take 1 tablet (800 mg total) by mouth 3 (three) times daily. 30 tablet Augusto Gamble B, NP   HYDROcodone-acetaminophen (NORCO/VICODIN) 5-325 MG tablet Take 1 tablet by mouth every 6 (six) hours as needed. 5 tablet Augusto Gamble B, NP     I have reviewed the PDMP during this encounter.   Zigmund Gottron, NP 11/17/20 2037

## 2020-11-17 NOTE — ED Triage Notes (Signed)
Pt reports dental pain x 1 day. Aleve gives no relief.

## 2020-11-29 ENCOUNTER — Ambulatory Visit (INDEPENDENT_AMBULATORY_CARE_PROVIDER_SITE_OTHER): Payer: Medicaid Other | Admitting: Podiatry

## 2020-11-29 ENCOUNTER — Other Ambulatory Visit: Payer: Self-pay

## 2020-11-29 DIAGNOSIS — G5763 Lesion of plantar nerve, bilateral lower limbs: Secondary | ICD-10-CM | POA: Diagnosis not present

## 2020-11-29 MED ORDER — METHYLPREDNISOLONE 4 MG PO TBPK: ORAL_TABLET | ORAL | 0 refills | Status: DC

## 2020-12-05 ENCOUNTER — Encounter: Payer: Self-pay | Admitting: Podiatry

## 2020-12-05 NOTE — Progress Notes (Signed)
Subjective:  Patient ID: Breanna Miranda, female    DOB: 06-05-1986,  MRN: [id number]  Chief Complaint  Patient presents with   Foot Pain     right foot pain     35 y.o. female presents with the above complaint.  Patient presents with follow-up of bilateral forefoot metatarsalgia.  She states she is doing a lot better does not any pain.  She has been using the padding.  She has been using the cam boot as well.  She denies any other acute complaints.   Review of Systems: Negative except as noted in the HPI. Denies N/V/F/Ch.  Past Medical History:  Diagnosis Date   Anemia    Anxiety    Asthma    Blood transfusion without reported diagnosis 2016   Chiari I malformation ([place])    Empty sella ([place])    Irregular heart rhythm    Menorrhagia     Current Outpatient Medications:    methylPREDNISolone (MEDROL DOSEPAK) 4 MG TBPK tablet, Take as directed, Disp: 21 each, Rfl: 0   benzonatate (TESSALON) 100 MG capsule, Take 1-2 capsules (100-200 mg total) by mouth 3 (three) times daily as needed for cough., Disp: 60 capsule, Rfl: 0   cetirizine (ZYRTEC ALLERGY) 10 MG tablet, Take 1 tablet (10 mg total) by mouth daily., Disp: 30 tablet, Rfl: 0   citalopram (CELEXA) 20 MG tablet, Take 1 tablet (20 mg total) by mouth daily., Disp: 30 tablet, Rfl: 1   ferrous sulfate 325 (65 FE) MG tablet, Take 1 tablet (325 mg total) by mouth daily with breakfast., Disp: 30 tablet, Rfl: 0   ferumoxytol (FERAHEME) 510 MG/17ML SOLN injection, Inject 17 mLs (510 mg total) into the vein once for 1 dose., Disp: 17 mL, Rfl: 0   HYDROcodone-acetaminophen (NORCO/VICODIN) 5-325 MG tablet, Take 1 tablet by mouth every 6 (six) hours as needed., Disp: 5 tablet, Rfl: 0   ibuprofen (ADVIL) 800 MG tablet, Take 1 tablet (800 mg total) by mouth 3 (three) times daily., Disp: 30 tablet, Rfl: 0   meloxicam (MOBIC) 15 MG tablet, Take 1 tablet (15 mg total) by mouth daily., Disp: 30 tablet, Rfl: 0   metroNIDAZOLE (FLAGYL) 500 MG  tablet, Take 1 tablet (500 mg total) by mouth 2 (two) times daily., Disp: 14 tablet, Rfl: 0   ondansetron (ZOFRAN ODT) 4 MG disintegrating tablet, Take 1 tablet (4 mg total) by mouth every 8 (eight) hours as needed for nausea or vomiting., Disp: 20 tablet, Rfl: 0   pantoprazole (PROTONIX) 20 MG tablet, Take 1 tablet (20 mg total) by mouth daily., Disp: 90 tablet, Rfl: 1   promethazine-dextromethorphan (PROMETHAZINE-DM) 6.25-15 MG/5ML syrup, Take 5 mLs by mouth at bedtime as needed for cough., Disp: 100 mL, Rfl: 0   pseudoephedrine (SUDAFED) 60 MG tablet, Take 1 tablet (60 mg total) by mouth every 8 (eight) hours as needed for congestion., Disp: 30 tablet, Rfl: 0   triamcinolone ointment (KENALOG) 0.1 %, Apply 1 application topically 2 (two) times daily., Disp: 30 g, Rfl: 1  Current Facility-Administered Medications:    Acetaminophen CAPS 500 mg, 500 mg, Oral, Once, [name] T, MD  Social History   Tobacco Use  Smoking Status Former   Packs/day: 0.10   Years: 2.00   Pack years: 0.20   Types: Cigarettes   Quit date: 05/25/2007   Years since quitting: 13.5  Smokeless Tobacco Never    Allergies  Allergen Reactions   Penicillins Anaphylaxis, Hives and Itching    Has patient  had a PCN reaction causing immediate rash, facial/tongue/throat swelling, SOB or lightheadedness with hypotension: Yes Has patient had a PCN reaction causing severe rash involving mucus membranes or skin necrosis: No Has patient had a PCN reaction that required hospitalization No Has patient had a PCN reaction occurring within the last 10 years: Yes If all of the above answers are "NO", then may proceed with Cephalosporin use.    Shrimp [Shellfish Allergy] Anaphylaxis, Hives and Itching   Objective:  There were no vitals filed for this visit. There is no height or weight on file to calculate BMI. Constitutional Well developed. Well nourished.  Vascular Dorsalis pedis pulses palpable bilaterally. Posterior  tibial pulses palpable bilaterally. Capillary refill normal to all digits.  No cyanosis or clubbing noted. Pedal hair growth normal.  Neurologic Normal speech. Oriented to person, place, and time. Epicritic sensation to light touch grossly present bilaterally.  Dermatologic Nails well groomed and normal in appearance. No open wounds. No skin lesions.  Orthopedic: Now pain on palpation to the ball of the foot.  No plantar fat pad atrophy noted.  The findings were consistent bilaterally.  No pain in the intermetatarsal spaces.  No signs of neuroma.  No signs of capsulitis due to the each of the metatarsophalangeal joint.   Radiographs: None Assessment:   1. Morton metatarsalgia, bilateral     Plan:  Patient was evaluated and treated and all questions answered.  Bilateral metatarsalgia right greater than left -Clinically healed with cam boot immobilization and metatarsal pads.  I incorporated and encouraged her to continue utilizing the boot as needed.  At this time patient can transition to regular shoes without any restriction.  If she is unable to do so I will asked her to come and see me immediately.  She states understanding.  No follow-ups on file.

## 2020-12-12 ENCOUNTER — Other Ambulatory Visit: Payer: Self-pay | Admitting: Podiatry

## 2020-12-12 ENCOUNTER — Encounter: Payer: Self-pay | Admitting: Podiatry

## 2020-12-12 DIAGNOSIS — G5763 Lesion of plantar nerve, bilateral lower limbs: Secondary | ICD-10-CM

## 2020-12-12 NOTE — Progress Notes (Unsigned)
righ 

## 2020-12-13 ENCOUNTER — Encounter: Payer: Self-pay | Admitting: Family Medicine

## 2020-12-22 ENCOUNTER — Other Ambulatory Visit: Payer: Self-pay

## 2020-12-22 ENCOUNTER — Ambulatory Visit
Admission: RE | Admit: 2020-12-22 | Discharge: 2020-12-22 | Disposition: A | Discharge status: Home / Self Care | Payer: Medicaid Other | Source: Ambulatory Visit | Attending: Podiatry | Admitting: Podiatry

## 2020-12-22 DIAGNOSIS — M79671 Pain in right foot: Secondary | ICD-10-CM | POA: Diagnosis not present

## 2020-12-22 DIAGNOSIS — G5763 Lesion of plantar nerve, bilateral lower limbs: Secondary | ICD-10-CM

## 2020-12-22 DIAGNOSIS — M25474 Effusion, right foot: Secondary | ICD-10-CM | POA: Diagnosis not present

## 2020-12-22 HISTORY — PX: BREAST BIOPSY: SHX20

## 2020-12-22 MED ORDER — GADOBENATE DIMEGLUMINE 529 MG/ML IV SOLN
16.0000 mL | Freq: Once | INTRAVENOUS | Status: AC | PRN
  Administered 2020-12-22: 16 mL via INTRAVENOUS

## 2020-12-29 ENCOUNTER — Other Ambulatory Visit: Payer: Self-pay

## 2020-12-29 ENCOUNTER — Ambulatory Visit (INDEPENDENT_AMBULATORY_CARE_PROVIDER_SITE_OTHER): Payer: Medicaid Other | Admitting: Podiatry

## 2020-12-29 DIAGNOSIS — M7752 Other enthesopathy of left foot: Secondary | ICD-10-CM

## 2020-12-29 DIAGNOSIS — M7751 Other enthesopathy of right foot: Secondary | ICD-10-CM

## 2020-12-29 DIAGNOSIS — M722 Plantar fascial fibromatosis: Secondary | ICD-10-CM | POA: Diagnosis not present

## 2021-01-03 ENCOUNTER — Encounter: Payer: Self-pay | Admitting: Podiatry

## 2021-01-03 NOTE — Progress Notes (Signed)
Subjective:  Patient ID: Breanna Miranda, female    DOB: 1985-11-07,  MRN: [id number]  Chief Complaint  Patient presents with   Foot Pain    Pt stated that she is still having a lot of pain in both feet     35 y.o. female presents with the above complaint.  Patient presents with complaint of bilateral heel pain as well as bilateral submetatarsal 2 pain.  Patient states been having hurting a lot.  Patient states that she was feeling a little bit better but the pain continues to get worse.  She denies seeing anyone else prior to seeing me.  She denies any other acute complaints.  She would like to discuss treatment options for this.   Review of Systems: Negative except as noted in the HPI. Denies N/V/F/Ch.  Past Medical History:  Diagnosis Date   Anemia    Anxiety    Asthma    Blood transfusion without reported diagnosis 2016   Chiari I malformation ([place])    Empty sella ([place])    Irregular heart rhythm    Menorrhagia     Current Outpatient Medications:    benzonatate (TESSALON) 100 MG capsule, Take 1-2 capsules (100-200 mg total) by mouth 3 (three) times daily as needed for cough., Disp: 60 capsule, Rfl: 0   cetirizine (ZYRTEC ALLERGY) 10 MG tablet, Take 1 tablet (10 mg total) by mouth daily., Disp: 30 tablet, Rfl: 0   citalopram (CELEXA) 20 MG tablet, Take 1 tablet (20 mg total) by mouth daily., Disp: 30 tablet, Rfl: 1   ferrous sulfate 325 (65 FE) MG tablet, Take 1 tablet (325 mg total) by mouth daily with breakfast., Disp: 30 tablet, Rfl: 0   ferumoxytol (FERAHEME) 510 MG/17ML SOLN injection, Inject 17 mLs (510 mg total) into the vein once for 1 dose., Disp: 17 mL, Rfl: 0   HYDROcodone-acetaminophen (NORCO/VICODIN) 5-325 MG tablet, Take 1 tablet by mouth every 6 (six) hours as needed., Disp: 5 tablet, Rfl: 0   ibuprofen (ADVIL) 800 MG tablet, Take 1 tablet (800 mg total) by mouth 3 (three) times daily., Disp: 30 tablet, Rfl: 0   meloxicam (MOBIC) 15 MG tablet, Take 1 tablet (15  mg total) by mouth daily., Disp: 30 tablet, Rfl: 0   methylPREDNISolone (MEDROL DOSEPAK) 4 MG TBPK tablet, Take as directed, Disp: 21 each, Rfl: 0   metroNIDAZOLE (FLAGYL) 500 MG tablet, Take 1 tablet (500 mg total) by mouth 2 (two) times daily., Disp: 14 tablet, Rfl: 0   ondansetron (ZOFRAN ODT) 4 MG disintegrating tablet, Take 1 tablet (4 mg total) by mouth every 8 (eight) hours as needed for nausea or vomiting., Disp: 20 tablet, Rfl: 0   pantoprazole (PROTONIX) 20 MG tablet, Take 1 tablet (20 mg total) by mouth daily., Disp: 90 tablet, Rfl: 1   promethazine-dextromethorphan (PROMETHAZINE-DM) 6.25-15 MG/5ML syrup, Take 5 mLs by mouth at bedtime as needed for cough., Disp: 100 mL, Rfl: 0   pseudoephedrine (SUDAFED) 60 MG tablet, Take 1 tablet (60 mg total) by mouth every 8 (eight) hours as needed for congestion., Disp: 30 tablet, Rfl: 0   triamcinolone ointment (KENALOG) 0.1 %, Apply 1 application topically 2 (two) times daily., Disp: 30 g, Rfl: 1  Current Facility-Administered Medications:    Acetaminophen CAPS 500 mg, 500 mg, Oral, Once, [name] T, MD  Social History   Tobacco Use  Smoking Status Former   Packs/day: 0.10   Years: 2.00   Pack years: 0.20   Types: Cigarettes  Quit date: 05/25/2007   Years since quitting: 13.6  Smokeless Tobacco Never    Allergies  Allergen Reactions   Penicillins Anaphylaxis, Hives and Itching    Has patient had a PCN reaction causing immediate rash, facial/tongue/throat swelling, SOB or lightheadedness with hypotension: Yes Has patient had a PCN reaction causing severe rash involving mucus membranes or skin necrosis: No Has patient had a PCN reaction that required hospitalization No Has patient had a PCN reaction occurring within the last 10 years: Yes If all of the above answers are "NO", then may proceed with Cephalosporin use.    Shrimp [Shellfish Allergy] Anaphylaxis, Hives and Itching   Objective:  There were no vitals filed for  this visit. There is no height or weight on file to calculate BMI. Constitutional Well developed. Well nourished.  Vascular Dorsalis pedis pulses palpable bilaterally. Posterior tibial pulses palpable bilaterally. Capillary refill normal to all digits.  No cyanosis or clubbing noted. Pedal hair growth normal.  Neurologic Normal speech. Oriented to person, place, and time. Epicritic sensation to light touch grossly present bilaterally.  Dermatologic Nails well groomed and normal in appearance. No open wounds. No skin lesions.  Orthopedic: Normal joint ROM without pain or crepitus bilaterally. No visible deformities. Tender to palpation at the calcaneal tuber bilaterally. No pain with calcaneal squeeze bilaterally. Ankle ROM diminished range of motion bilaterally. Silfverskiold Test: positive bilaterally.  Bilateral submetatarsal 2 metatarsophalangeal joint pain.  Pain with range of motion.  No crepitus noted.  No extensor or flexor tendinitis noted.  No plantar plate rupture noted.   Radiographs: Negative for Morton's neuroma.   Fluid in the first intermetatarsal space most compatible with bursitis. Trace amount of fluid in the second and third intermetatarsal spaces is also noted.   Partial visualization of a cystic appearing lesion superior to the fourth or fifth tarsometatarsal joint which could be a ganglion or synovial cyst. The lesion cannot be characterized on this study.  Assessment:   1. Plantar fasciitis of right foot   2. Plantar fasciitis of left foot   3. Capsulitis of metatarsophalangeal (MTP) joint of right foot   4. Capsulitis of metatarsophalangeal (MTP) joint of left foot    Plan:  Patient was evaluated and treated and all questions answered.  Plantar Fasciitis, bilaterally - XR reviewed as above.  - Educated on icing and stretching. Instructions given.  - Injection delivered to the plantar fascia as below. - DME: None - Pharmacologic management:  None -MRI was reviewed with the patient in extensive detail.  Unfortunately MRI was not able to extrapolate any kind of neuroma as well as symptoms in the forefoot where clinically she is having pain.  Right submetatarsal 2 capsulitis -I explained patient the etiology of capsulitis and various treatment options were extensively discussed.  Given the amount of pain she is having I believe she will benefit from a steroid injection to help decrease acute inflammatory component associated pain.  Patient states understanding. -A steroid injection was performed at right second MPJ using 1% plain Lidocaine and 10 mg of Kenalog. This was well tolerated.   Procedure: Injection Tendon/Ligament Location: Bilateral plantar fascia at the glabrous junction; medial approach. Skin Prep: alcohol Injectate: 0.5 cc 0.5% marcaine plain, 0.5 cc of 1% Lidocaine, 0.5 cc kenalog 10. Disposition: Patient tolerated procedure well. Injection site dressed with a band-aid.  No follow-ups on file.

## 2021-01-04 ENCOUNTER — Ambulatory Visit
Admission: RE | Admit: 2021-01-04 | Discharge: 2021-01-04 | Disposition: A | Discharge status: Home / Self Care | Payer: Medicaid Other | Source: Ambulatory Visit | Attending: Family Medicine | Admitting: Family Medicine

## 2021-01-04 ENCOUNTER — Other Ambulatory Visit: Payer: Self-pay | Admitting: Family Medicine

## 2021-01-04 ENCOUNTER — Other Ambulatory Visit: Payer: Self-pay

## 2021-01-04 DIAGNOSIS — N644 Mastodynia: Secondary | ICD-10-CM

## 2021-01-04 DIAGNOSIS — R922 Inconclusive mammogram: Secondary | ICD-10-CM | POA: Diagnosis not present

## 2021-01-10 ENCOUNTER — Ambulatory Visit
Admission: RE | Admit: 2021-01-10 | Discharge: 2021-01-10 | Disposition: A | Discharge status: Home / Self Care | Payer: Medicaid Other | Source: Ambulatory Visit | Attending: Family Medicine | Admitting: Family Medicine

## 2021-01-10 ENCOUNTER — Other Ambulatory Visit: Payer: Self-pay

## 2021-01-10 DIAGNOSIS — N6324 Unspecified lump in the left breast, lower inner quadrant: Secondary | ICD-10-CM | POA: Diagnosis not present

## 2021-01-10 DIAGNOSIS — N644 Mastodynia: Secondary | ICD-10-CM

## 2021-01-22 ENCOUNTER — Other Ambulatory Visit: Payer: Self-pay

## 2021-01-22 ENCOUNTER — Emergency Department (HOSPITAL_COMMUNITY)
Admission: EM | Admit: 2021-01-22 | Discharge: 2021-01-22 | Disposition: A | Discharge status: Home / Self Care | Payer: No Typology Code available for payment source | Attending: Emergency Medicine | Admitting: Emergency Medicine

## 2021-01-22 ENCOUNTER — Encounter (HOSPITAL_COMMUNITY): Payer: Self-pay

## 2021-01-22 ENCOUNTER — Emergency Department (HOSPITAL_COMMUNITY): Payer: No Typology Code available for payment source

## 2021-01-22 DIAGNOSIS — Y92410 Unspecified street and highway as the place of occurrence of the external cause: Secondary | ICD-10-CM | POA: Insufficient documentation

## 2021-01-22 DIAGNOSIS — S161XXA Strain of muscle, fascia and tendon at neck level, initial encounter: Secondary | ICD-10-CM | POA: Diagnosis not present

## 2021-01-22 DIAGNOSIS — Z87891 Personal history of nicotine dependence: Secondary | ICD-10-CM | POA: Diagnosis not present

## 2021-01-22 DIAGNOSIS — Z7951 Long term (current) use of inhaled steroids: Secondary | ICD-10-CM | POA: Diagnosis not present

## 2021-01-22 DIAGNOSIS — Z8616 Personal history of COVID-19: Secondary | ICD-10-CM | POA: Diagnosis not present

## 2021-01-22 DIAGNOSIS — J45909 Unspecified asthma, uncomplicated: Secondary | ICD-10-CM | POA: Insufficient documentation

## 2021-01-22 DIAGNOSIS — M542 Cervicalgia: Secondary | ICD-10-CM | POA: Diagnosis not present

## 2021-01-22 DIAGNOSIS — S169XXA Unspecified injury of muscle, fascia and tendon at neck level, initial encounter: Secondary | ICD-10-CM | POA: Diagnosis present

## 2021-01-22 MED ORDER — METHOCARBAMOL 500 MG PO TABS: 500.0000 mg | ORAL_TABLET | Freq: Two times a day (BID) | ORAL | 0 refills | Status: DC
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
## 2021-01-23 ENCOUNTER — Telehealth: Payer: Self-pay

## 2021-01-23 NOTE — Telephone Encounter (Signed)
Transition Care Management Follow-up Telephone Call Date of discharge and from where: 01/22/2021-[facility] How have you been since you were released from the hospital? Patient stated she is still in pain but does not like to take medication that can make her drowsy.  Any questions or concerns? No  Items Reviewed: Did the pt receive and understand the discharge instructions provided? Yes  Medications obtained and verified? Yes  Other? No  Any new allergies since your discharge? No  Dietary orders reviewed? N/A Do you have support at home? Yes   Home Care and Equipment/Supplies: Were home health services ordered? not applicable If so, what is the name of the agency? N/A  Has the agency set up a time to come to the patient's home? not applicable Were any new equipment or medical supplies ordered?  No What is the name of the medical supply agency? N/A Were you able to get the supplies/equipment? not applicable Do you have any questions related to the use of the equipment or supplies? No  Functional Questionnaire: (I = Independent and D = Dependent) ADLs: I  Bathing/Dressing- I  Meal Prep- I  Eating- I  Maintaining continence- I  Transferring/Ambulation- I  Managing Meds- I  Follow up appointments reviewed:  PCP Hospital f/u appt confirmed? No  Specialist Hospital f/u appt confirmed? No   Are transportation arrangements needed? No  If their condition worsens, is the pt aware to call PCP or go to the Emergency Dept.? Yes Was the patient provided with contact information for the PCP's office or ED? Yes Was to pt encouraged to call back with questions or concerns? Yes

## 2021-01-23 NOTE — Telephone Encounter (Signed)
Transition Care Management Unsuccessful Follow-up Telephone Call  Date of discharge and from where:  01/22/2021-[facility]   Attempts:  1st Attempt  Reason for unsuccessful TCM follow-up call:  Unable to reach patient

## 2021-01-25 ENCOUNTER — Other Ambulatory Visit: Payer: Self-pay | Admitting: Chiropractic Medicine

## 2021-01-25 DIAGNOSIS — M545 Low back pain, unspecified: Secondary | ICD-10-CM

## 2021-02-11 ENCOUNTER — Other Ambulatory Visit: Payer: Medicaid Other

## 2021-02-14 ENCOUNTER — Encounter: Payer: Self-pay | Admitting: Student

## 2021-03-02 ENCOUNTER — Encounter: Payer: Self-pay | Admitting: Student

## 2021-03-28 ENCOUNTER — Encounter: Payer: Self-pay | Admitting: Student

## 2021-03-28 MED ORDER — DICLOFENAC SODIUM 1 % EX GEL: CUTANEOUS | 3 refills | Status: DC

## 2021-04-23 ENCOUNTER — Encounter: Payer: Self-pay | Admitting: Student

## 2021-04-26 ENCOUNTER — Ambulatory Visit (INDEPENDENT_AMBULATORY_CARE_PROVIDER_SITE_OTHER): Payer: Medicaid Other | Admitting: Family Medicine

## 2021-04-26 ENCOUNTER — Other Ambulatory Visit: Payer: Self-pay

## 2021-04-26 ENCOUNTER — Encounter: Payer: Self-pay | Admitting: Family Medicine

## 2021-04-26 VITALS — BP 113/78 | HR 80 | Ht 64.0 in | Wt 210.8 lb

## 2021-04-26 DIAGNOSIS — Z23 Encounter for immunization: Secondary | ICD-10-CM | POA: Diagnosis not present

## 2021-04-26 DIAGNOSIS — Z008 Encounter for other general examination: Secondary | ICD-10-CM

## 2021-04-26 NOTE — Progress Notes (Signed)
    SUBJECTIVE:   CHIEF COMPLAINT / HPI:   Breanna Miranda is a 35 yo who presents to discuss flu vaccine exemption form. Was discussed with her PCP she got sick in 2004 after receiving the flu vaccine.  States that she is not allergic, but just made her extremely sick after taking it so she has not taken it since.  Endorses being hospitalized a couple days after receiving the vaccine.  Her job is wanting a doctor's note for exemption from getting the flu vaccine.She states she has been signing declaration form but now job needs documentation from provider.  Denies allergies to eggs.  Feeling well today with no fever or URI symptoms.   OBJECTIVE:   Ht 5\' 4"  (1.626 m)   Wt 210 lb 12.8 oz (95.6 kg)   LMP 04/22/2021 (Exact Date)   BMI 36.18 kg/m    Physical exam General: well appearing, NAD Cardiovascular: RRR, no murmurs Lungs: CTAB. Normal WOB Abdomen: soft, non-distended, non-tender Skin: warm, dry. No edema  ASSESSMENT/PLAN:   No problem-specific Assessment & Plan notes found for this encounter.   Request for flu vaccine exemption  Discussed I am not able to provide this note given that there is no documentation of reaction from the flu vaccine in the past, but that we could closely monitor her after receiving the flu vaccine from our facility. She was understanding of our policy, and agreed to receive the flu vaccine today. Wrote note for work. Discussed strict return precautions if she were to develop symptoms after leaving.   [place]

## 2021-04-26 NOTE — Patient Instructions (Addendum)
It was great seeing you today!  Today you came in to discuss exemption from the flu vaccine. We discussed that I am unable to write this without documentation of this incident and its relation to the flu vaccine. You did opt to get the flu vaccine today.  You may have some arm soreness. If you experience difficulty breathing or any new and concerning symptoms please go to the ED. More information attached below.   Feel free to call with any questions or concerns at any time, at [phone].   Take care,  Dr. Shary Key [facility]    Influenza (Flu) Vaccine (Inactivated or Recombinant): What You Need to Know 1. Why get vaccinated? Influenza vaccine can prevent influenza (flu). Flu is a contagious disease that spreads around the [place] every year, usually between October and May. Anyone can get the flu, but it is more dangerous for some people. Infants and young children, people 88 years and older, pregnant people, and people with certain health conditions or a weakened immune system are at greatest risk of flu complications. Pneumonia, bronchitis, sinus infections, and ear infections are examples of flu-related complications. If you have a medical condition, such as heart disease, cancer, or diabetes, flu can make it worse. Flu can cause fever and chills, sore throat, muscle aches, fatigue, cough, headache, and runny or stuffy nose. Some people may have vomiting and diarrhea, though this is more common in children than adults. In an average year, thousands of people in the [place] die from flu, and many more are hospitalized. Flu vaccine prevents millions of illnesses and flu-related visits to the doctor each year. 2. Influenza vaccines CDC recommends everyone 6 months and older get vaccinated every flu season. Children 6 months through 93 years of age may need 2 doses during a single flu season. Everyone else needs only 1 dose each flu season. It  takes about 2 weeks for protection to develop after vaccination. There are many flu viruses, and they are always changing. Each year a new flu vaccine is made to protect against the influenza viruses believed to be likely to cause disease in the upcoming flu season. Even when the vaccine doesn't exactly match these viruses, it may still provide some protection. Influenza vaccine does not cause flu. Influenza vaccine may be given at the same time as other vaccines. 3. Talk with your health care provider Tell your vaccination provider if the person getting the vaccine: Has had an allergic reaction after a previous dose of influenza vaccine, or has any severe, life-threatening allergies Has ever had Guillain-Barr Syndrome (also called "GBS") In some cases, your health care provider may decide to postpone influenza vaccination until a future visit. Influenza vaccine can be administered at any time during pregnancy. People who are or will be pregnant during influenza season should receive inactivated influenza vaccine. People with minor illnesses, such as a cold, may be vaccinated. People who are moderately or severely ill should usually wait until they recover before getting influenza vaccine. Your health care provider can give you more information. 4. Risks of a vaccine reaction Soreness, redness, and swelling where the shot is given, fever, muscle aches, and headache can happen after influenza vaccination. There may be a very small increased risk of Guillain-Barr Syndrome (GBS) after inactivated influenza vaccine (the flu shot). Young children who get the flu shot along with pneumococcal vaccine (PCV13) and/or DTaP vaccine at the same time might be slightly more likely to have  a seizure caused by fever. Tell your health care provider if a child who is getting flu vaccine has ever had a seizure. People sometimes faint after medical procedures, including vaccination. Tell your provider if you feel  dizzy or have vision changes or ringing in the ears. As with any medicine, there is a very remote chance of a vaccine causing a severe allergic reaction, other serious injury, or death. 5. What if there is a serious problem? An allergic reaction could occur after the vaccinated person leaves the clinic. If you see signs of a severe allergic reaction (hives, swelling of the face and throat, difficulty breathing, a fast heartbeat, dizziness, or weakness), call 9-1-1 and get the person to the nearest hospital. For other signs that concern you, call your health care provider. Adverse reactions should be reported to the Vaccine Adverse Event Reporting System (VAERS). Your health care provider will usually file this report, or you can do it yourself. Visit the VAERS website at www.vaers.SamedayNews.es or call 3254631173. VAERS is only for reporting reactions, and VAERS staff members do not give medical advice. 6. The National Vaccine Injury Compensation Program The [employer] Vaccine Injury Compensation Program (VICP) is a federal program that was created to compensate people who may have been injured by certain vaccines. Claims regarding alleged injury or death due to vaccination have a time limit for filing, which may be as short as two years. Visit the VICP website at GoldCloset.com.ee or call 978 129 9254 to learn about the program and about filing a claim. 7. How can I learn more? Ask your health care provider. Call your local or state health department. Visit the website of the Food and Drug Administration (FDA) for vaccine package inserts and additional information at TraderRating.uy. Contact the Centers for Disease Control and Prevention (CDC): Call (412)849-6128 (1-800-CDC-INFO) or Visit CDC's website at https://gibson.com/. Vaccine Information Statement Inactivated Influenza Vaccine (01/28/2020) This information is not intended to replace advice given to you  by your health care provider. Make sure you discuss any questions you have with your health care provider. Document Revised: 03/16/2020 Document Reviewed: 03/16/2020 Elsevier Patient Education  2022 [employer].

## 2021-04-30 ENCOUNTER — Ambulatory Visit: Payer: Medicaid Other

## 2021-05-19 ENCOUNTER — Emergency Department (HOSPITAL_COMMUNITY): Payer: Medicaid Other

## 2021-05-19 ENCOUNTER — Emergency Department (HOSPITAL_COMMUNITY)
Admission: EM | Admit: 2021-05-19 | Discharge: 2021-05-20 | Disposition: A | Discharge status: Home / Self Care | Payer: Medicaid Other | Attending: Emergency Medicine | Admitting: Emergency Medicine

## 2021-05-19 DIAGNOSIS — Z5321 Procedure and treatment not carried out due to patient leaving prior to being seen by health care provider: Secondary | ICD-10-CM | POA: Diagnosis not present

## 2021-05-19 DIAGNOSIS — R079 Chest pain, unspecified: Secondary | ICD-10-CM | POA: Insufficient documentation

## 2021-05-19 DIAGNOSIS — R0789 Other chest pain: Secondary | ICD-10-CM | POA: Diagnosis not present

## 2021-05-19 LAB — I-STAT BETA HCG BLOOD, ED (MC, WL, AP ONLY): I-stat hCG, quantitative: <5 m[IU]/mL (ref <5)

## 2021-05-19 NOTE — ED Provider Notes (Signed)
Emergency Medicine Provider Triage Evaluation Note  Breanna Miranda , Miranda 35 y.o. female  was evaluated in triage.  Pt complains of chest pain.  Began after drinking some wine.  States it looks like there are things floating in the wine.  No radiation of chest pain.  Nonpleuritic, nonexertional nature.  Review of Systems  Positive: cp Negative: Fever, sob  Physical Exam  LMP 04/22/2021 (Exact Date)  Gen:   Awake, no distress   Resp:  Normal effort  MSK:   Moves extremities without difficulty  Other:    Medical Decision Making  Medically screening exam initiated at 11:22 PM.  Appropriate orders placed.  Breanna Miranda was informed that the remainder of the evaluation will be completed by another provider, this initial triage assessment does not replace that evaluation, and the importance of remaining in the ED until their evaluation is complete.  CP, possible ingestion    Breanna Kuch A, PA-C 05/19/21 2325    Margette Fast, MD 05/21/21 [phone]

## 2021-05-20 ENCOUNTER — Other Ambulatory Visit: Payer: Self-pay

## 2021-05-20 ENCOUNTER — Encounter (HOSPITAL_COMMUNITY): Payer: Self-pay

## 2021-05-20 LAB — CBC WITH DIFFERENTIAL/PLATELET
Abs Immature Granulocytes: 0.03 10*3/uL (ref 0.00–0.07)
Basophils Absolute: 0 10*3/uL (ref 0.0–0.1)
Basophils Relative: 0 %
Eosinophils Absolute: 0.1 10*3/uL (ref 0.0–0.5)
Eosinophils Relative: 1 %
HCT: 32.4 % — ABNORMAL LOW (ref 36.0–46.0)
Hemoglobin: 9.7 g/dL — ABNORMAL LOW (ref 12.0–15.0)
Immature Granulocytes: 1 %
Lymphocytes Relative: 28 %
Lymphs Abs: 1.6 10*3/uL (ref 0.7–4.0)
MCH: 22 pg — ABNORMAL LOW (ref 26.0–34.0)
MCHC: 29.9 g/dL — ABNORMAL LOW (ref 30.0–36.0)
MCV: 73.6 fL — ABNORMAL LOW (ref 80.0–100.0)
Monocytes Absolute: 0.5 10*3/uL (ref 0.1–1.0)
Monocytes Relative: 9 %
Neutro Abs: 3.6 10*3/uL (ref 1.7–7.7)
Neutrophils Relative %: 61 %
Platelets: 300 10*3/uL (ref 150–400)
RBC: 4.4 MIL/uL (ref 3.87–5.11)
RDW: 18 % — ABNORMAL HIGH (ref 11.5–15.5)
WBC: 5.8 10*3/uL (ref 4.0–10.5)
nRBC: 0 % (ref 0.0–0.2)

## 2021-05-20 LAB — BASIC METABOLIC PANEL
Anion gap: 7 (ref 5–15)
BUN: 11 mg/dL (ref 6–20)
CO2: 25 mmol/L (ref 22–32)
Calcium: 8.8 mg/dL — ABNORMAL LOW (ref 8.9–10.3)
Chloride: 103 mmol/L (ref 98–111)
Creatinine, Ser: 0.7 mg/dL (ref 0.44–1.00)
GFR, Estimated: >60 mL/min (ref >60)
Glucose, Bld: 107 mg/dL — ABNORMAL HIGH (ref 70–99)
Potassium: 3.1 mmol/L — ABNORMAL LOW (ref 3.5–5.1)
Sodium: 135 mmol/L (ref 135–145)

## 2021-05-20 LAB — RAPID URINE DRUG SCREEN, HOSP PERFORMED
Amphetamines: NOT DETECTED
Barbiturates: NOT DETECTED
Benzodiazepines: NOT DETECTED
Cocaine: NOT DETECTED
Opiates: NOT DETECTED
Tetrahydrocannabinol: NOT DETECTED

## 2021-05-20 LAB — ETHANOL: Alcohol, Ethyl (B): <10 mg/dL (ref <10)

## 2021-05-20 LAB — TROPONIN I (HIGH SENSITIVITY): Troponin I (High Sensitivity): <2 ng/L (ref <18)

## 2021-05-20 NOTE — ED Triage Notes (Addendum)
Pt states that she feels like someone put something in her wine bottles at home last night. Pt reports having chest pain after she drank some of the wine.

## 2021-05-21 ENCOUNTER — Other Ambulatory Visit: Payer: Self-pay

## 2021-05-21 ENCOUNTER — Encounter: Payer: Self-pay | Admitting: Podiatry

## 2021-05-21 ENCOUNTER — Ambulatory Visit (INDEPENDENT_AMBULATORY_CARE_PROVIDER_SITE_OTHER): Payer: Medicaid Other | Admitting: Family Medicine

## 2021-05-21 ENCOUNTER — Encounter: Payer: Self-pay | Admitting: Family Medicine

## 2021-05-21 ENCOUNTER — Telehealth: Payer: Self-pay

## 2021-05-21 ENCOUNTER — Ambulatory Visit (INDEPENDENT_AMBULATORY_CARE_PROVIDER_SITE_OTHER): Payer: Medicaid Other | Admitting: Podiatry

## 2021-05-21 VITALS — BP 122/75 | HR 80 | Wt 210.0 lb

## 2021-05-21 DIAGNOSIS — Z01419 Encounter for gynecological examination (general) (routine) without abnormal findings: Secondary | ICD-10-CM | POA: Diagnosis not present

## 2021-05-21 DIAGNOSIS — M546 Pain in thoracic spine: Secondary | ICD-10-CM

## 2021-05-21 DIAGNOSIS — R5383 Other fatigue: Secondary | ICD-10-CM

## 2021-05-21 DIAGNOSIS — M722 Plantar fascial fibromatosis: Secondary | ICD-10-CM

## 2021-05-21 DIAGNOSIS — Z1159 Encounter for screening for other viral diseases: Secondary | ICD-10-CM

## 2021-05-21 MED ORDER — DICLOFENAC SODIUM 75 MG PO TBEC: 75.0000 mg | DELAYED_RELEASE_TABLET | Freq: Two times a day (BID) | ORAL | 2 refills | Status: DC

## 2021-05-21 MED ORDER — TRIAMCINOLONE ACETONIDE 10 MG/ML IJ SUSP
20.0000 mg | Freq: Once | INTRAMUSCULAR | Status: AC
Start: 2021-05-21 — End: 2021-05-21
  Administered 2021-05-21: 14:00:00 20 mg

## 2021-05-21 NOTE — Patient Instructions (Signed)
Thank you for coming in today.  We discussed fatigue and got blood work.  I will notify you when blood work is available via Pharmacist, community.  We also discussed upper back pain.  For this try heat as well as lidocaine patches and tennis ball massage.  I have also referred you to neurosurgery for further assessment of your back pain as requested.  Remember to reschedule for your Pap smear as it should be done this year.

## 2021-05-21 NOTE — Telephone Encounter (Signed)
Transition Care Management Follow-up Telephone Call Date of discharge and from where: 05/20/2021 from [place] How have you been since you were released from the hospital? Pt stated that she is feeling okay today. Pt has appt with PCP today at 3:50pm Any questions or concerns? No  Items Reviewed: Did the pt receive and understand the discharge instructions provided? Yes  Medications obtained and verified? Yes  Other? No  Any new allergies since your discharge? No  Dietary orders reviewed? No Do you have support at home? Yes   Functional Questionnaire: (I = Independent and D = Dependent) ADLs: I  Bathing/Dressing- I  Meal Prep- I  Eating- I  Maintaining continence- I  Transferring/Ambulation- I  Managing Meds- I   Follow up appointments reviewed:  PCP Hospital f/u appt confirmed? Yes  Scheduled to see [facility] on 05/21/2021 @ 3:50pm. [facility] f/u appt confirmed? No   Are transportation arrangements needed? No  If their condition worsens, is the pt aware to call PCP or go to the Emergency Dept.? Yes Was the patient provided with contact information for the PCP's office or ED? Yes Was to pt encouraged to call back with questions or concerns? Yes
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
## 2021-05-21 NOTE — Progress Notes (Signed)
Subjective:   Patient ID: Breanna Miranda, female   DOB: 35 y.o.   MRN: [id number]   HPI Patient states both her heels have been hurting her a lot and it just started in the last month with the right being worse than the left   ROS      Objective:  Physical Exam  Alert status intact with acute inflammation of the plantar heel region bilateral fluid buildup around the medial band     Assessment:  Acute Planter fasciitis bilateral     Plan:  Sterile prep injected the plantar fascial bilateral 3 mg Kenalog 5 mg Xylocaine advised on anti-inflammatories and will try 1 diclofenac at night due to the fact her pain is only at night.  May ultimately require surgery or other treatment we will see the response to conservative care

## 2021-05-22 LAB — HCV INTERPRETATION

## 2021-05-22 LAB — VITAMIN B12: Vitamin B-12: 939 pg/mL (ref 232–1245)

## 2021-05-22 LAB — LIPID PANEL
Chol/HDL Ratio: 3 ratio (ref 0.0–4.4)
Cholesterol, Total: 185 mg/dL (ref 100–199)
HDL: 61 mg/dL (ref >39)
LDL Chol Calc (NIH): 71 mg/dL (ref 0–99)
Triglycerides: 340 mg/dL — ABNORMAL HIGH (ref 0–149)
VLDL Cholesterol Cal: 53 mg/dL — ABNORMAL HIGH (ref 5–40)

## 2021-05-22 LAB — HCV AB W REFLEX TO QUANT PCR: HCV Ab: <0.1 s/co ratio (ref 0.0–0.9)

## 2021-05-22 LAB — TSH: TSH: 1.28 u[IU]/mL (ref 0.450–4.500)

## 2021-05-28 ENCOUNTER — Encounter: Payer: Self-pay | Admitting: Student

## 2021-06-12 ENCOUNTER — Telehealth: Payer: Self-pay | Admitting: Student

## 2021-06-12 NOTE — Telephone Encounter (Signed)
.. °  Medicaid Managed Care   Unsuccessful Outreach Note  06/12/2021 Name: Breanna Miranda MRN: [id number] DOB: [birth year]  Referred by: Wells Guiles, DO Reason for referral : High Risk Managed Medicaid (I called the patient today to get her scheduled for a phone visit with the MM RNCM. She did not answer and there was not a VM.)   An unsuccessful telephone outreach was attempted today. The patient was referred to the case management team for assistance with care management and care coordination.   Follow Up Plan: The care management team will reach out to the patient again over the next 14 days.   [place]

## 2021-06-19 ENCOUNTER — Encounter (HOSPITAL_COMMUNITY): Payer: Self-pay

## 2021-06-19 ENCOUNTER — Emergency Department (HOSPITAL_COMMUNITY)
Admission: EM | Admit: 2021-06-19 | Discharge: 2021-06-19 | Disposition: A | Discharge status: Home / Self Care | Payer: Medicaid Other | Attending: Emergency Medicine | Admitting: Emergency Medicine

## 2021-06-19 DIAGNOSIS — Z7951 Long term (current) use of inhaled steroids: Secondary | ICD-10-CM | POA: Diagnosis not present

## 2021-06-19 DIAGNOSIS — Z20822 Contact with and (suspected) exposure to covid-19: Secondary | ICD-10-CM | POA: Diagnosis not present

## 2021-06-19 DIAGNOSIS — Z9049 Acquired absence of other specified parts of digestive tract: Secondary | ICD-10-CM | POA: Diagnosis not present

## 2021-06-19 DIAGNOSIS — J45909 Unspecified asthma, uncomplicated: Secondary | ICD-10-CM | POA: Diagnosis not present

## 2021-06-19 DIAGNOSIS — Z87891 Personal history of nicotine dependence: Secondary | ICD-10-CM | POA: Insufficient documentation

## 2021-06-19 DIAGNOSIS — R112 Nausea with vomiting, unspecified: Secondary | ICD-10-CM | POA: Insufficient documentation

## 2021-06-19 DIAGNOSIS — Z8616 Personal history of COVID-19: Secondary | ICD-10-CM | POA: Diagnosis not present

## 2021-06-19 LAB — COMPREHENSIVE METABOLIC PANEL
ALT: 12 U/L (ref 0–44)
AST: 16 U/L (ref 15–41)
Albumin: 3.4 g/dL — ABNORMAL LOW (ref 3.5–5.0)
Alkaline Phosphatase: 56 U/L (ref 38–126)
Anion gap: 5 (ref 5–15)
BUN: 8 mg/dL (ref 6–20)
CO2: 27 mmol/L (ref 22–32)
Calcium: 8.5 mg/dL — ABNORMAL LOW (ref 8.9–10.3)
Chloride: 106 mmol/L (ref 98–111)
Creatinine, Ser: 0.61 mg/dL (ref 0.44–1.00)
GFR, Estimated: >60 mL/min (ref >60)
Glucose, Bld: 107 mg/dL — ABNORMAL HIGH (ref 70–99)
Potassium: 3.6 mmol/L (ref 3.5–5.1)
Sodium: 138 mmol/L (ref 135–145)
Total Bilirubin: 0.5 mg/dL (ref 0.3–1.2)
Total Protein: 7.2 g/dL (ref 6.5–8.1)

## 2021-06-19 LAB — CBC
HCT: 33.1 % — ABNORMAL LOW (ref 36.0–46.0)
Hemoglobin: 9.8 g/dL — ABNORMAL LOW (ref 12.0–15.0)
MCH: 21.9 pg — ABNORMAL LOW (ref 26.0–34.0)
MCHC: 29.6 g/dL — ABNORMAL LOW (ref 30.0–36.0)
MCV: 73.9 fL — ABNORMAL LOW (ref 80.0–100.0)
Platelets: 301 10*3/uL (ref 150–400)
RBC: 4.48 MIL/uL (ref 3.87–5.11)
RDW: 17.8 % — ABNORMAL HIGH (ref 11.5–15.5)
WBC: 5.2 10*3/uL (ref 4.0–10.5)
nRBC: 0 % (ref 0.0–0.2)

## 2021-06-19 LAB — URINALYSIS, ROUTINE W REFLEX MICROSCOPIC
Bilirubin Urine: NEGATIVE
Glucose, UA: NEGATIVE mg/dL
Hgb urine dipstick: NEGATIVE
Ketones, ur: NEGATIVE mg/dL
Leukocytes,Ua: NEGATIVE
Nitrite: NEGATIVE
Protein, ur: NEGATIVE mg/dL
Specific Gravity, Urine: 1.03 (ref 1.005–1.030)
pH: 6 (ref 5.0–8.0)

## 2021-06-19 LAB — LIPASE, BLOOD: Lipase: 36 U/L (ref 11–51)

## 2021-06-19 LAB — RESP PANEL BY RT-PCR (FLU A&B, COVID) ARPGX2
Influenza A by PCR: NEGATIVE
Influenza B by PCR: NEGATIVE
SARS Coronavirus 2 by RT PCR: NEGATIVE

## 2021-06-19 LAB — I-STAT BETA HCG BLOOD, ED (MC, WL, AP ONLY): I-stat hCG, quantitative: <5 m[IU]/mL (ref <5)

## 2021-06-19 MED ORDER — LACTATED RINGERS IV BOLUS
1000.0000 mL | Freq: Once | INTRAVENOUS | Status: AC
  Administered 2021-06-19: 18:00:00 1000 mL via INTRAVENOUS

## 2021-06-19 MED ORDER — ONDANSETRON 4 MG PO TBDP: 4.0000 mg | ORAL_TABLET | Freq: Three times a day (TID) | ORAL | 0 refills | Status: DC | PRN

## 2021-06-19 MED ORDER — METOCLOPRAMIDE HCL 5 MG/ML IJ SOLN
10.0000 mg | Freq: Once | INTRAMUSCULAR | Status: AC
  Administered 2021-06-19: 18:00:00 10 mg via INTRAVENOUS
  Filled 2021-06-19: qty 2

## 2021-06-19 MED ORDER — DIPHENHYDRAMINE HCL 50 MG/ML IJ SOLN
25.0000 mg | Freq: Once | INTRAMUSCULAR | Status: AC
  Administered 2021-06-19: 18:00:00 25 mg via INTRAVENOUS
  Filled 2021-06-19: qty 1

## 2021-06-19 MED ORDER — ONDANSETRON 4 MG PO TBDP
4.0000 mg | ORAL_TABLET | Freq: Once | ORAL | Status: AC
  Administered 2021-06-19: 14:00:00 4 mg via ORAL
  Filled 2021-06-19: qty 1

## 2021-06-19 NOTE — ED Triage Notes (Signed)
Pt presents with c/o vomiting that started today. Pt reports she began to feel nauseous yesterday and then the vomiting began this morning.

## 2021-06-19 NOTE — ED Provider Notes (Signed)
Emergency Medicine Provider Triage Evaluation Note  Breanna Miranda , a 35 y.o. female  was evaluated in triage.  Pt complains of nausea all day yesterday. Vomited 10 times since 6 am this morning. No fever or chills. Couldn't eat anything. Sipped some ginger ale and ate saltines.  Review of Systems  Positive: Nausea and vomiting.  Negative: Fever   Physical Exam  BP (!) 113/46 (BP Location: Left Arm)    Pulse 88    Temp 98.1 F (36.7 C) (Oral)    Resp 16    LMP 06/17/2021 (Approximate)    SpO2 100%  Gen:   Awake, no distress   Resp:  Normal effort  MSK:   Moves extremities without difficulty  Other:    Medical Decision Making  Medically screening exam initiated at 2:14 PM.  Appropriate orders placed.  Breanna Miranda was informed that the remainder of the evaluation will be completed by another provider, this initial triage assessment does not replace that evaluation, and the importance of remaining in the ED until their evaluation is complete.  No URI sx. Neg covid test at home no fevers. No UTI sx per pt.  Will obtain labs given numerous vomiting episodes.    Breanna Miranda, [place] 06/19/21 1417    Sherwood Gambler, MD 06/19/21 1610

## 2021-06-19 NOTE — ED Provider Notes (Signed)
[facility] DEPT Provider Note   CSN: [id number] Arrival date & time: 06/19/21  1342     History Chief Complaint  Patient presents with   Vomiting    Breanna Miranda is a 35 y.o. female.  HPI Patient presents for nausea and vomiting.  Prior to yesterday, she was in her normal state of health.  She denies any history of cyclic vomiting.  Yesterday morning, she awoke with nausea.  This persisted throughout the day.  Today, she developed vomiting.  She has vomited approximately 10 times and has had p.o. intolerance.  In the ED waiting room, she was given Zofran with some relief of her nausea.  Currently, she endorses mild nausea only.  She denies any associated abdominal pain.  She has not had fevers, chills, diarrhea, cough, or shortness of breath.  She has not had urinary or vaginal symptoms.  She has had tubal ligation and cholecystectomy in the past.  Last bowel movement was yesterday and was normal.    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[place]) 04/25/2020   Gastroesophageal reflux disease without esophagitis 04/25/2020   DUB (dysfunctional uterine bleeding) 01/15/2018   Fibroid uterus 01/15/2018   Low grade squamous intraepith lesion on cytologic smear cervix (lgsil) 01/15/2018   Insomnia 10/15/2017   Symptomatic anemia 09/21/2017   Pica in adults    Chest pain 08/15/2016   Tachycardia 08/15/2016   Syncope 08/12/2014    Menorrhagia 08/10/2014   Anxiety 08/10/2014   Anemia 05/06/2012    Past Surgical History:  Procedure Laterality Date   CHOLECYSTECTOMY     TUBAL LIGATION       OB History     Gravida  5   Para  4   Term  2   Preterm  2   AB  1   Living  4      SAB  1   IAB  0   Ectopic  0   Multiple  0   Live Births  4           Family History  Problem Relation Age of Onset   Diabetes Father    Kidney disease Father        failure due to diabetes   Hypertension Mother    Heart attack Maternal Grandmother 63    Social History   Tobacco Use   Smoking status: Former    Packs/day: 0.10    Years: 2.00    Pack years: 0.20    Types: Cigarettes    Quit date: 05/25/2007    Years since quitting: 14.0   Smokeless tobacco: Never  Substance Use Topics   Alcohol use: Yes    Alcohol/week: 1.0 standard drink    Types: 1 Glasses of wine per week    Comment: occaional   Drug use: No    Home Medications Prior to Admission medications  Medication Sig Start Date End Date Taking? Authorizing Provider  ondansetron (ZOFRAN-ODT) 4 MG disintegrating tablet Take 1 tablet (4 mg total) by mouth every 8 (eight) hours as needed for nausea or vomiting. 06/19/21  Yes Godfrey Pick, MD  citalopram (CELEXA) 20 MG tablet Take 1 tablet (20 mg total) by mouth daily. 04/24/20   Mullis, Kiersten P, DO  diclofenac (VOLTAREN) 75 MG EC tablet Take 1 tablet (75 mg total) by mouth 2 (two) times daily. 05/21/21   Wallene Huh, DPM  diclofenac Sodium (VOLTAREN) 1 % GEL Apply as needed over back, shoulders, heels for pain 03/28/21   Wells Guiles, DO  ferrous sulfate 325 (65 FE) MG tablet Take 1 tablet (325 mg total) by mouth daily with breakfast. 09/23/17   Everrett Coombe, MD  ferumoxytol [facility]) 510 MG/17ML SOLN injection Inject 17 mLs (510 mg total) into the vein once for 1 dose. 07/03/20 07/03/20  Benay Pike, MD  ibuprofen (ADVIL) 800 MG tablet Take 1 tablet (800 mg total) by mouth 3 (three)  times daily. 11/17/20   Zigmund Gottron, NP  methocarbamol (ROBAXIN) 500 MG tablet Take 1 tablet (500 mg total) by mouth 2 (two) times daily. 01/22/21   Khatri, Hina, PA-C  pantoprazole (PROTONIX) 20 MG tablet Take 1 tablet (20 mg total) by mouth daily. 07/13/20   Benay Pike, MD  triamcinolone ointment (KENALOG) 0.1 % Apply 1 application topically 2 (two) times daily. 07/13/20   Benay Pike, MD  fluticasone [facility]) 50 MCG/ACT nasal spray Place 2 sprays into both nostrils daily. 05/03/18 08/02/19  Wurst, Marye Round, PA-C  montelukast (SINGULAIR) 10 MG tablet Take 1 tablet (10 mg total) by mouth at bedtime. 05/12/18 08/02/19  Kinnie Feil, MD    Allergies    Penicillins and Shrimp [shellfish allergy]  Review of Systems   Review of Systems  Constitutional:  Positive for appetite change. Negative for activity change, chills, fatigue and fever.  HENT:  Negative for congestion, ear pain, sore throat and trouble swallowing.   Eyes:  Negative for pain and visual disturbance.  Respiratory:  Negative for cough, chest tightness and shortness of breath.   Cardiovascular:  Negative for chest pain and palpitations.  Gastrointestinal:  Positive for nausea and vomiting. Negative for abdominal distention, abdominal pain, blood in stool, constipation and diarrhea.  Genitourinary:  Negative for dysuria, flank pain, hematuria, pelvic pain and urgency.  Musculoskeletal:  Negative for arthralgias, back pain, myalgias and neck pain.  Skin:  Negative for color change and rash.  Neurological:  Negative for dizziness, seizures, syncope, light-headedness and headaches.  All other systems reviewed and are negative.  Physical Exam Updated Vital Signs BP 114/70    Pulse 79    Temp 98.1 F (36.7 C) (Oral)    Resp 14    LMP 06/17/2021 (Approximate)    SpO2 100%   Physical Exam Vitals and nursing note reviewed.  Constitutional:      General: She is not in acute distress.    Appearance: Normal appearance. She  is well-developed and normal weight. She is not ill-appearing, toxic-appearing or diaphoretic.  HENT:     Head: Normocephalic and atraumatic.     Right Ear: External ear normal.     Left Ear: External ear normal.     Nose: Nose normal.     Mouth/Throat:     Mouth: Mucous membranes are moist.     Pharynx: Oropharynx is clear.  Eyes:     General: No scleral icterus.  Extraocular Movements: Extraocular movements intact.     Conjunctiva/sclera: Conjunctivae normal.  Cardiovascular:     Rate and Rhythm: Normal rate and regular rhythm.     Heart sounds: No murmur heard. Pulmonary:     Effort: Pulmonary effort is normal. No respiratory distress.     Breath sounds: Normal breath sounds. No wheezing or rales.  Abdominal:     Palpations: Abdomen is soft.     Tenderness: There is no abdominal tenderness. There is no right CVA tenderness or left CVA tenderness.  Musculoskeletal:        General: No swelling or tenderness. Normal range of motion.     Cervical back: Normal range of motion and neck supple. No rigidity.  Skin:    General: Skin is warm and dry.     Capillary Refill: Capillary refill takes less than 2 seconds.     Coloration: Skin is not jaundiced or pale.  Neurological:     Mental Status: She is alert.  Psychiatric:        Mood and Affect: Mood normal.    ED Results / Procedures / Treatments   Labs (all labs ordered are listed, but only abnormal results are displayed) Labs Reviewed  COMPREHENSIVE METABOLIC PANEL - Abnormal; Notable for the following components:      Result Value   Glucose, Bld 107 (*)    Calcium 8.5 (*)    Albumin 3.4 (*)    All other components within normal limits  CBC - Abnormal; Notable for the following components:   Hemoglobin 9.8 (*)    HCT 33.1 (*)    MCV 73.9 (*)    MCH 21.9 (*)    MCHC 29.6 (*)    RDW 17.8 (*)    All other components within normal limits  URINALYSIS, ROUTINE W REFLEX MICROSCOPIC - Abnormal; Notable for the following  components:   APPearance HAZY (*)    All other components within normal limits  RESP PANEL BY RT-PCR (FLU A&B, COVID) ARPGX2  LIPASE, BLOOD  I-STAT BETA HCG BLOOD, ED ([facility], [facility], [facility] ONLY)    EKG None  Radiology No results found.  Procedures Procedures   Medications Ordered in ED Medications  ondansetron (ZOFRAN-ODT) disintegrating tablet 4 mg (4 mg Oral Given 06/19/21 1425)  lactated ringers bolus 1,000 mL (0 mLs Intravenous Stopped 06/19/21 2025)  metoCLOPramide (REGLAN) injection 10 mg (10 mg Intravenous Given 06/19/21 1809)  diphenhydrAMINE (BENADRYL) injection 25 mg (25 mg Intravenous Given 06/19/21 1809)    ED Course  I have reviewed the triage vital signs and the nursing notes.  Pertinent labs & imaging results that were available during my care of the patient were reviewed by me and considered in my medical decision making (see chart for details).    MDM Rules/Calculators/A&P                         Patient is a healthy 35 year old female presenting for 2 days of nausea and vomiting starting this morning.  Prior to being bedded in the ED, she did receive ODT Zofran.  She experienced some relief of her symptoms and has not vomited since.  On initial assessment, patient is well-appearing.  Abdomen is soft without any evidence of tenderness.  Vital signs are normal.  Given her p.o. intolerance throughout the day, IV fluids were given for hydration.  Reglan was given for further symptomatic relief of nausea.  Lab work was obtained while in the ED triage.  Results were all reassuring.  On reassessment, patient reports resolution of nausea.  She was able to eat and drink in the ED.  She does feel comfortable going home at this point.  As needed Zofran was prescribed for symptomatic relief at home. She was advised to return to the ED if she experiences any recurrence of severe symptoms.  She was discharged in good condition.  Final Clinical Impression(s) / ED Diagnoses Final  diagnoses:  Nausea and vomiting, unspecified vomiting type    Rx / DC Orders ED Discharge Orders          Ordered    ondansetron (ZOFRAN-ODT) 4 MG disintegrating tablet  Every 8 hours PRN        06/19/21 1927             Godfrey Pick, MD 06/20/21 [id number]

## 2021-06-20 ENCOUNTER — Telehealth: Payer: Self-pay

## 2021-06-20 NOTE — Telephone Encounter (Signed)
Transition Care Management Unsuccessful Follow-up Telephone Call  Date of discharge and from where:  06/19/2021 from [facility]  Attempts:  1st Attempt  Reason for unsuccessful TCM follow-up call:  Left voice message

## 2021-06-21 NOTE — Telephone Encounter (Signed)
Transition Care Management Unsuccessful Follow-up Telephone Call  Date of discharge and from where:  06/19/2021 from [facility]  Attempts:  2nd Attempt  Reason for unsuccessful TCM follow-up call:  Left voice message

## 2021-06-22 NOTE — Telephone Encounter (Signed)
Transition Care Management Unsuccessful Follow-up Telephone Call  Date of discharge and from where:  06/19/2021 from [facility]  Attempts:  3rd Attempt  Reason for unsuccessful TCM follow-up call:  Unable to reach patient

## 2021-07-12 ENCOUNTER — Encounter: Payer: Self-pay | Admitting: Family Medicine

## 2021-07-12 ENCOUNTER — Ambulatory Visit (INDEPENDENT_AMBULATORY_CARE_PROVIDER_SITE_OTHER): Payer: Medicaid Other | Admitting: Family Medicine

## 2021-07-12 ENCOUNTER — Other Ambulatory Visit (HOSPITAL_COMMUNITY)
Admission: RE | Admit: 2021-07-12 | Discharge: 2021-07-12 | Disposition: A | Discharge status: Home / Self Care | Payer: Medicaid Other | Source: Ambulatory Visit | Attending: Family Medicine | Admitting: Family Medicine

## 2021-07-12 ENCOUNTER — Other Ambulatory Visit: Payer: Self-pay

## 2021-07-12 VITALS — BP 104/74 | HR 72 | Ht 64.0 in | Wt 212.6 lb

## 2021-07-12 DIAGNOSIS — E781 Pure hyperglyceridemia: Secondary | ICD-10-CM | POA: Diagnosis not present

## 2021-07-12 DIAGNOSIS — Z124 Encounter for screening for malignant neoplasm of cervix: Secondary | ICD-10-CM

## 2021-07-12 MED ORDER — PANTOPRAZOLE SODIUM 20 MG PO TBEC
20.0000 mg | DELAYED_RELEASE_TABLET | Freq: Every day | ORAL | 1 refills | Status: DC
Start: 2021-07-12 — End: 2021-08-20

## 2021-07-12 MED ORDER — DICLOFENAC SODIUM 1 % EX GEL: CUTANEOUS | 3 refills | Status: DC

## 2021-07-12 NOTE — Patient Instructions (Signed)
It was so great seeing you today! Today we discussed the following:  - We completed your pap smear today and your results will take a few days to come back. I will notify you of results via MyChart, phone call, or letter. Please contact the office if you have not heard back about results within 2 weeks.   Please make sure to bring any medications you take to your appointments. If you have any questions or concerns please call the office at [phone].

## 2021-07-12 NOTE — Progress Notes (Signed)
° ° °  SUBJECTIVE:   CHIEF COMPLAINT / HPI:   Patient presents for Pap smear and wants to talk about lab work.  She notes that she was told that her cholesterol levels were little bit high and she has been having increasing amount of anxiety due to this.   PERTINENT  PMH / PSH: Reviewed  OBJECTIVE:   BP 104/74    Pulse 72    Ht 5\' 4"  (1.626 m)    Wt 212 lb 9.6 oz (96.4 kg)    LMP 06/17/2021 (Approximate)    SpO2 100%    BMI 36.49 kg/m   General: NAD, well-appearing, well-nourished Respiratory: No respiratory distress, breathing comfortably, able to speak in full sentences Skin: warm and dry, no rashes noted on exposed skin Psych: Appropriate affect and mood Pelvic exam: normal external genitalia, vulva, vagina, cervix, uterus and adnexa, PAP: Pap smear done today, exam chaperoned by Lavell Anchors, CMA.  ASSESSMENT/PLAN:   Pap smear In 2021, patient had colposcopy that showed mild squamous atypia suggestive koilocytic effect. Pap smear was completed today: No cervical discharge noted.  Per patient request, added GC/CH to the culture. - Follow-up Pap smear results - Follow-up gonorrhea/committee results  Hypertriglyceridemia Patient was initially concerned as she was told her cholesterol levels were high, extensively counseled patient on all lab results collected in the last 2 months.  Patient was not fasting for her lipid panel, counseled that she should make dietary changes and we can follow this up in the next year.   Breanna Miranda, [place]

## 2021-07-17 ENCOUNTER — Telehealth: Payer: Self-pay | Admitting: Student

## 2021-07-17 NOTE — Telephone Encounter (Signed)
.. °  Medicaid Managed Care   Unsuccessful Outreach Note  07/17/2021 Name: EMERLY PRAK MRN: [id number] DOB: January 13, 1986  Referred by: Wells Guiles, DO Reason for referral : High Risk Managed Medicaid (I called the patient today to get her scheduled with the MM Team. She sis not answer and there was not a VM>)   A second unsuccessful telephone outreach was attempted today. The patient was referred to the case management team for assistance with care management and care coordination.   Follow Up Plan: The care management team will reach out to the patient again over the next 14 days.   [place]

## 2021-07-18 ENCOUNTER — Encounter: Payer: Self-pay | Admitting: Family Medicine

## 2021-07-18 LAB — CYTOLOGY - PAP
Chlamydia: NEGATIVE
Comment: NEGATIVE
Comment: NEGATIVE
Comment: NORMAL
Diagnosis: NEGATIVE
Neisseria Gonorrhea: NEGATIVE
Trichomonas: NEGATIVE

## 2021-07-23 ENCOUNTER — Telehealth: Payer: Self-pay

## 2021-07-23 NOTE — Telephone Encounter (Signed)
Attempted to reach patient. No answer. LVM for patient to call the office to schedule appt with [facility]. Salvatore Marvel, CMA

## 2021-07-23 NOTE — Telephone Encounter (Signed)
-----   Message from Rise Patience, DO sent at 07/19/2021 11:47 AM EST ----- Regarding: [place] [facility] Can we call this patient and try to get her scheduled from [facility]? She had sent a MyChart message and I replied to her with this recommendation. Please let me know if she wants to talk further.     Alana Lilland, DO

## 2021-07-23 NOTE — Telephone Encounter (Signed)
-----   Message from Rise Patience, DO sent at 07/19/2021 11:47 AM EST ----- Regarding: [place] [facility] Can we call this patient and try to get her scheduled from [facility] clinic? She had sent a MyChart message and I replied to her with this recommendation. Please let me know if she wants to talk further.     Alana Lilland, DO

## 2021-07-25 NOTE — Telephone Encounter (Signed)
Called patient to further discuss and she is now scheduled with [facility] clinic. Recommended scheduling with her PCP for further evaluation of her other concerns.

## 2021-07-26 ENCOUNTER — Telehealth: Payer: Self-pay

## 2021-07-26 NOTE — Telephone Encounter (Signed)
-----   Message from Rise Patience, DO sent at 07/19/2021 11:47 AM EST ----- Regarding: [place] [facility] Can we call this patient and try to get her scheduled from [facility] clinic? She had sent a MyChart message and I replied to her with this recommendation. Please let me know if she wants to talk further.     Alana Lilland, DO

## 2021-07-26 NOTE — Telephone Encounter (Signed)
Patient has appt in [facility] clinic on 2/23. Salvatore Marvel, CMA

## 2021-07-31 ENCOUNTER — Encounter: Payer: Self-pay | Admitting: Family Medicine

## 2021-07-31 ENCOUNTER — Ambulatory Visit (INDEPENDENT_AMBULATORY_CARE_PROVIDER_SITE_OTHER): Payer: Medicaid Other | Admitting: Family Medicine

## 2021-07-31 ENCOUNTER — Other Ambulatory Visit: Payer: Self-pay

## 2021-07-31 VITALS — BP 116/74 | HR 76 | Wt 213.0 lb

## 2021-07-31 DIAGNOSIS — F419 Anxiety disorder, unspecified: Secondary | ICD-10-CM

## 2021-07-31 DIAGNOSIS — Z6836 Body mass index (BMI) 36.0-36.9, adult: Secondary | ICD-10-CM

## 2021-07-31 DIAGNOSIS — N898 Other specified noninflammatory disorders of vagina: Secondary | ICD-10-CM | POA: Diagnosis not present

## 2021-07-31 DIAGNOSIS — E669 Obesity, unspecified: Secondary | ICD-10-CM | POA: Diagnosis present

## 2021-07-31 DIAGNOSIS — R0989 Other specified symptoms and signs involving the circulatory and respiratory systems: Secondary | ICD-10-CM

## 2021-07-31 MED ORDER — SERTRALINE HCL 25 MG PO TABS: 25.0000 mg | ORAL_TABLET | Freq: Every day | ORAL | 0 refills | Status: DC

## 2021-07-31 MED ORDER — FLUTICASONE PROPIONATE 50 MCG/ACT NA SUSP: 2.0000 | Freq: Every day | NASAL | 6 refills | Status: DC

## 2021-07-31 MED ORDER — MOUNJARO 2.5 MG/0.5ML ~~LOC~~ SOAJ: 2.5000 mg | SUBCUTANEOUS | 0 refills | Status: DC

## 2021-07-31 NOTE — Patient Instructions (Addendum)
It was great seeing you today!  Today we discussed weight loss, your throat clearing, and anxiety  We are starting you on a low-dose of Zoloft 25 mg daily, and we will plan to increase it at follow-up.  For your throat, I have prescribed Flonase to use in each nostril daily in the event that this is caused by postnasal drip  For weight loss I will start the process on a GLP-1 agonist (information below) for weight loss. If approved it will be a low dose injection 2.5mg  once a week.  If the co-pay is expensive, I recommend going online to [email or web address].com to print out a coupon card.  I have also provided the information to the weight loss clinic to get on their waiting list:   [facility] [place], [place], [place] [address] [phone]  Continue eating balanced meals, cutting out processed foods, beverages with sugar, and including vegetables with each meal.  Please stop by the front desk on your way out, and schedule a follow-up appointment for 2 weeks to follow up on Zoloft, your weight loss medication, and do your pelvic and breast exam at that time.   Feel free to call with any questions or concerns at any time, at [phone].   Take care,  Breanna Miranda [facility]     Tirzepatide Injection What is this medication? TIRZEPATIDE (tir ZEP a tide) treats type 2 diabetes. It works by increasing insulin levels in your body, which decreases your blood sugar (glucose). Changes to diet and exercise are often combined with this medication. This medicine may be used for other purposes; ask your health care provider or pharmacist if you have questions. COMMON BRAND NAME(S): MOUNJARO What should I tell my care team before I take this medication? They need to know if you have any of these conditions: Endocrine tumors (MEN 2) or if someone in your family had these tumors Eye disease, vision problems Gallbladder disease History of  pancreatitis Kidney disease Stomach or intestine problems Thyroid cancer or if someone in your family had thyroid cancer An unusual or allergic reaction to tirzepatide, other medications, foods, dyes, or preservatives Pregnant or trying to get pregnant Breast-feeding How should I use this medication? This medication is injected under the skin. You will be taught how to prepare and give it. It is given once every week (every 7 days). Keep taking it unless your health care provider tells you to stop. If you use this medication with insulin, you should inject this medication and the insulin separately. Do not mix them together. Do not give the injections right next to each other. Change (rotate) injection sites with each injection. This medication comes with INSTRUCTIONS FOR USE. Ask your pharmacist for directions on how to use this medication. Read the information carefully. Talk to your pharmacist or care team if you have questions. It is important that you put your used needles and syringes in a special sharps container. Do not put them in a trash can. If you do not have a sharps container, call your pharmacist or care team to get one. A special MedGuide will be given to you by the pharmacist with each prescription and refill. Be sure to read this information carefully each time. Talk to your care team about the use of this medication in children. Special care may be needed. Overdosage: If you think you have taken too much of this medicine contact a poison control center or  emergency room at once. NOTE: This medicine is only for you. Do not share this medicine with others. What if I miss a dose? If you miss a dose, take it as soon as you can unless it is more than 4 days (96 hours) late. If it is more than 4 days late, skip the missed dose. Take the next dose at the normal time. Do not take 2 doses within 3 days of each other. What may interact with this medication? Alcohol containing  beverages Antiviral medications for HIV or AIDS Aspirin and aspirin-like medications Beta-blockers like atenolol, metoprolol, propranolol Certain medications for blood pressure, heart disease, irregular heart beat Chromium Clonidine Diuretics Female hormones, such as estrogens or progestins, birth control pills Fenofibrate Gemfibrozil Guanethidine Isoniazid Lanreotide Female hormones or anabolic steroids MAOIs like Carbex, Eldepryl, Marplan, Nardil, and Parnate Medications for weight loss Medications for allergies, asthma, cold, or cough Medications for depression, anxiety, or psychotic disturbances Niacin Nicotine NSAIDs, medications for pain and inflammation, like ibuprofen or naproxen Octreotide Other medications for diabetes, like glyburide, glipizide, or glimepiride Pasireotide Pentamidine Phenytoin Probenecid Quinolone antibiotics such as ciprofloxacin, levofloxacin, ofloxacin Reserpine Some herbal dietary supplements Steroid medications such as prednisone or cortisone Sulfamethoxazole; trimethoprim Thyroid hormones Warfarin This list may not describe all possible interactions. Give your health care provider a list of all the medicines, herbs, non-prescription drugs, or dietary supplements you use. Also tell them if you smoke, drink alcohol, or use illegal drugs. Some items may interact with your medicine. What should I watch for while using this medication? Visit your care team for regular checks on your progress. Drink plenty of fluids while taking this medication. Check with your care team if you get an attack of severe diarrhea, nausea, and vomiting. The loss of too much body fluid can make it dangerous for you to take this medication. A test called the HbA1C (A1C) will be monitored. This is a simple blood test. It measures your blood sugar control over the last 2 to 3 months. You will receive this test every 3 to 6 months. Learn how to check your blood sugar. Learn  the symptoms of low and high blood sugar and how to manage them. Always carry a quick-source of sugar with you in case you have symptoms of low blood sugar. Examples include hard sugar candy or glucose tablets. Make sure others know that you can choke if you eat or drink when you develop serious symptoms of low blood sugar, such as seizures or unconsciousness. They must get medical help at once. Tell your care team if you have high blood sugar. You might need to change the dose of your medication. If you are sick or exercising more than usual, you might need to change the dose of your medication. Do not skip meals. Ask your care team if you should avoid alcohol. Many nonprescription cough and cold products contain sugar or alcohol. These can affect blood sugar. Pens should never be shared. Even if the needle is changed, sharing may result in passing of viruses like hepatitis or HIV. Wear a medical ID bracelet or chain, and carry a card that describes your disease and details of your medication and dosage times. Birth control may not work properly while you are taking this medication. If you take birth control pills by mouth, your care team may recommend another type of birth control for 4 weeks after you start this medication and for 4 weeks after each increase in your dose of this medication. Ask  your care team which birth control methods you should use. What side effects may I notice from receiving this medication? Side effects that you should report to your care team as soon as possible: Allergic reactions--skin rash, itching, hives, swelling of the face, lips, tongue, or throat Change in vision Dehydration--increased thirst, dry mouth, feeling faint or lightheaded, headache, dark yellow or brown urine Gallbladder problems--severe stomach pain, nausea, vomiting, fever Kidney injury--decrease in the amount of urine, swelling of the ankles, hands, or feet Pancreatitis--severe stomach pain that spreads  to your back or gets worse after eating or when touched, fever, nausea, vomiting Thyroid cancer--new mass or lump in the neck, pain or trouble swallowing, trouble breathing, hoarseness Side effects that usually do not require medical attention (report these to your care team if they continue or are bothersome): Constipation Diarrhea Loss of Appetite Nausea Stomach pain Upset stomach Vomiting This list may not describe all possible side effects. Call your doctor for medical advice about side effects. You may report side effects to FDA at 1-800-FDA-1088. Where should I keep my medication? Keep out of the reach of children and pets. Refrigeration (preferred): Store unopened pens in a refrigerator between 2 and 8 degrees C (36 and 46 degrees F). Keep it in the original carton until you are ready to take it. Do not freeze or use if the medication has been frozen. Protect from light. Get rid of any unused medication after the expiration date on the label. Room Temperature: The pen may be stored at room temperature below 30 degrees C (86 degrees F) for up to a total of 21 days if needed. Protect from light. Avoid exposure to extreme heat. If it is stored at room temperature, throw away any unused medication after 21 days or after it expires, whichever is first. The pen has glass parts. Handle it carefully. If you drop the pen on a hard surface, do not use it. Use a new pen for your injection. To get rid of medications that are no longer needed or have expired: Take the medication to a medication take-back program. Check with your pharmacy or law enforcement to find a location. If you cannot return the medication, ask your pharmacist or care team how to get rid of this medication safely. NOTE: This sheet is a summary. It may not cover all possible information. If you have questions about this medicine, talk to your doctor, pharmacist, or health care provider.  2022 Elsevier/Gold Standard (2020-11-08  00:00:00)

## 2021-07-31 NOTE — Progress Notes (Signed)
SUBJECTIVE:   CHIEF COMPLAINT / HPI:   Patient is a 36 y.o. female presenting with vaginal discharge and vaginal odor for about a month.  She states the discharge is of a clear consistency.  She is interested in screening for sexually transmitted infections today though she denies being currently sexually active. No urinary symptoms. She states she is currently on her menstrual cycle which is heavy and wonders if she should wait for her pelvic exam.   She endorses having to clear her throat all the time- occurring since November. Feels like something is in her chest but nothing comes up. Has tried cough drops, OTC medication. States it is worse at night when shes laying down. Denies recent infection, fevers or any other respiratory symptoms.   L breast tender- painful along chest wall and wras under Lbreast . Wasn't sure if it was coming from back . L arm also painful. 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 Worse with movement  She also states she wants to talk about weight loss. Has tried phentermine before which has been helpful. She states she has been trying to eat healthier and has been doing workouts at home.    OBJECTIVE:   BP 116/74    Pulse 76    Wt 213 lb (96.6 kg)    LMP 07/31/2021 (Exact Date)    SpO2 100%    BMI 36.56 kg/m    Physical exam General: well appearing, NAD Cardiovascular: RRR, no murmurs Lungs: CTAB. Normal WOB Abdomen: soft, non-distended, non-tender Skin: warm, dry. No edema Psych: mood and affect normal. Speech non pressured. Thought content and judgement normal    ASSESSMENT/PLAN:   No problem-specific Assessment & Plan notes found for this encounter.  Obesity BMI 36.65.  She states she has been working on her weight by eating healthier, and working out at home.  Discussed the healthy weight and wellness center, but that I will also be happy to help her with weight loss.  We discussed GLP-1 agonists and although she does not have diabetes it  will be helpful for her weight loss.  It looks like Darcel Bayley is covered with her insurance, so we will try 2.5 mg weekly.  Discussed with her how to inject the medication, and discussed that we will need frequent follow-ups while titrating this medication.  Vaginal odor and discharge Patient endorses a month of these vaginal symptoms, stating that she wanted to get checked for it when she had her Pap smear the other week but it was not checked.  She denies being sexually active, and thinks it may be due to Tynan which she has had in the past.  Discussed doing a pelvic exam and testing today, but given her heavy menses she opted to wait for follow-up in 2 weeks and get tested at that time.  Anxiety  GAD7 score of 19 today. Not currently on medication but is wanting to start something. No concern for bipolar disorder or symptoms of mania. States she has been feeling very anxious which has been waking her up out of her sleep. Has tried Celexa in the past but did not like the way it made her feel. Plan is to start on Zoloft 25mg  though from chart review after patient left and after medication was prescribed, it looks like she has tried this in the past and discontinued it. Will call her and confirm.   Throat clearing  Potentially due to postnasal drip given she feels there  is mucus there but does not cough anything up. Worse with laying down at night. Will try Flonase daily and follow up in 2 weeks  [place]

## 2021-08-01 ENCOUNTER — Telehealth: Payer: Self-pay

## 2021-08-01 ENCOUNTER — Other Ambulatory Visit: Payer: Self-pay | Admitting: Family Medicine

## 2021-08-01 ENCOUNTER — Encounter: Payer: Self-pay | Admitting: Family Medicine

## 2021-08-01 NOTE — Telephone Encounter (Signed)
Received fax from pharmacy, PA needed on Mounjaro.  Clinical questions submitted via Cover My Meds.  Waiting on response, could take up to 72 hours.  Cover My Meds info: Key: [id number]  Talbot Grumbling, RN

## 2021-08-01 NOTE — Telephone Encounter (Signed)
Medication denied by patient's insurance. See below.   [facility] does not cover the following service(s) in the [facility] Plan:  Mounjaro Inj 2.5/0.5 The requested drug is being used for weight loss. Drugs used for this reason are not covered under your plan. Please speak with your doctor about your choices.   Please advise.   Talbot Grumbling, RN

## 2021-08-09 ENCOUNTER — Telehealth: Payer: Self-pay | Admitting: Family Medicine

## 2021-08-09 NOTE — Telephone Encounter (Signed)
.. °  Medicaid Managed Care   Unsuccessful Outreach Note  08/09/2021 Name: Breanna Miranda MRN: [id number] DOB: 26-Sep-1985  Referred by: Shary Key, DO Reason for referral : High Risk Managed Medicaid (Third attempt to reach the patient to get her scheduled with the MM team. Unable to leave a message.)   Third unsuccessful telephone outreach was attempted today. The patient was referred to the case management team for assistance with care management and care coordination. The patient's primary care provider has been notified of our unsuccessful attempts to make or maintain contact with the patient. The care management team is pleased to engage with this patient at any time in the future should he/she be interested in assistance from the care management team.   Follow Up Plan: We have been unable to make contact with the patient for follow up. The care management team is available to follow up with the patient after provider conversation with the patient regarding recommendation for care management engagement and subsequent re-referral to the care management team.   [place], [place]

## 2021-08-16 ENCOUNTER — Other Ambulatory Visit: Payer: Self-pay

## 2021-08-16 ENCOUNTER — Encounter: Payer: Self-pay | Admitting: Podiatry

## 2021-08-16 ENCOUNTER — Ambulatory Visit (INDEPENDENT_AMBULATORY_CARE_PROVIDER_SITE_OTHER): Payer: Medicaid Other | Admitting: Family Medicine

## 2021-08-16 ENCOUNTER — Ambulatory Visit (INDEPENDENT_AMBULATORY_CARE_PROVIDER_SITE_OTHER): Payer: Medicaid Other | Admitting: Podiatry

## 2021-08-16 ENCOUNTER — Other Ambulatory Visit: Payer: Self-pay | Admitting: Family Medicine

## 2021-08-16 ENCOUNTER — Telehealth: Payer: Self-pay | Admitting: Family Medicine

## 2021-08-16 ENCOUNTER — Ambulatory Visit (INDEPENDENT_AMBULATORY_CARE_PROVIDER_SITE_OTHER): Payer: Medicaid Other

## 2021-08-16 VITALS — BP 98/62 | HR 87 | Wt 206.0 lb

## 2021-08-16 DIAGNOSIS — M722 Plantar fascial fibromatosis: Secondary | ICD-10-CM

## 2021-08-16 DIAGNOSIS — N898 Other specified noninflammatory disorders of vagina: Secondary | ICD-10-CM | POA: Diagnosis not present

## 2021-08-16 DIAGNOSIS — Z8742 Personal history of other diseases of the female genital tract: Secondary | ICD-10-CM | POA: Diagnosis not present

## 2021-08-16 LAB — POCT WET PREP (WET MOUNT)
Clue Cells Wet Prep Whiff POC: NEGATIVE
Trichomonas Wet Prep HPF POC: ABSENT

## 2021-08-16 LAB — POCT URINE PREGNANCY: Preg Test, Ur: NEGATIVE

## 2021-08-16 MED ORDER — MELOXICAM 15 MG PO TABS: 15.0000 mg | ORAL_TABLET | Freq: Every day | ORAL | 2 refills | Status: DC

## 2021-08-16 MED ORDER — TRIAMCINOLONE ACETONIDE 10 MG/ML IJ SUSP
20.0000 mg | Freq: Once | INTRAMUSCULAR | Status: AC
  Administered 2021-08-16: 20 mg

## 2021-08-16 MED ORDER — FLUCONAZOLE 150 MG PO TABS: 150.0000 mg | ORAL_TABLET | Freq: Once | ORAL | 0 refills | Status: AC

## 2021-08-16 NOTE — Telephone Encounter (Signed)
Positive yeast discussed with her. Will escribe diflucan.  She agreed with the plan.

## 2021-08-16 NOTE — Patient Instructions (Signed)
Colposcopy, Care After The following information offers guidance on how to care for yourself after your procedure. Your doctor may also give you more specific instructions. If you have problems or questions, contact your doctor. What can I expect after the procedure? If you did not have a sample of your tissue taken out (did not have a biopsy), you may only have some spotting of blood for a few days. You can go back to your normal activities. If you had a sample of your tissue taken out, it is common to have: Soreness and mild pain. These may last for a few days. Mild bleeding or fluid (discharge) coming from your vagina. The fluid will look dark and grainy. You may have this for a few days. The fluid may be caused by a liquid that was used during your procedure. You may need to wear a sanitary pad. Spotting of blood for at least 48 hours after the procedure. Follow these instructions at home: Medicines Take over-the-counter and prescription medicines only as told by your doctor. Ask your doctor what over-the-counter pain medicines and prescription medicines you can start taking again. This is very important if you take blood thinners. Activity For at least 3 days, or for as long as told by your doctor, avoid: Douching. Using tampons. Having sex. Return to your normal activities as told by your doctor. Ask your doctor what activities are safe for you. General instructions Ask your doctor if you may take baths, swim, or use a hot tub. You may take showers. If you use birth control (contraception), keep using it. Keep all follow-up visits. Contact a doctor if: You have a fever or chills. You faint or feel light-headed. Get help right away if: You bleed a lot from your vagina. A lot of bleeding means that the bleeding soaks through a pad in less than 1 hour. You have clumps of blood (blood clots) coming from your vagina. You have signs that could mean you have an infection. This may be fluid  coming from your vagina that is: Different than normal. Yellow. Bad-smelling. You have very bad pain or cramps in your lower belly that do not get better with medicine. Summary If you did not have a sample of your tissue taken out, you may only have some spotting of blood for a few days. You can go back to your normal activities. If you had a sample of your tissue taken out, it is common to have mild pain for a few days and spotting for 48 hours. Avoid douching, using tampons, and having sex for at least 3 days after the procedure or for as long as told. Get help right away if you have a lot of bleeding, very bad pain, or signs of infection. This information is not intended to replace advice given to you by your health care provider. Make sure you discuss any questions you have with your health care provider. Document Revised: 11/05/2020 Document Reviewed: 11/05/2020 Elsevier Patient Education  2022 Elsevier Inc.  

## 2021-08-16 NOTE — Progress Notes (Signed)
Patient ID: Breanna Miranda, female   DOB: [birth year], 36 y.o.   MRN: [id number]  Chief Complaint  Patient presents with   Colposcopy    HPI Breanna Miranda is a 36 y.o. female.  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 Her most recent PAP was negative for intraepithelial lesions. Unfortunately, HPV was not tested. Hence, we will proceed with a colposcopic evaluation. She also endorses vaginal discharge x 3 weeks and requested wet prep. She stated that she is not interested in STD testing.   Past Medical History:  Diagnosis Date   Anemia    Anxiety    Asthma    Blood transfusion without reported diagnosis 2016   Chiari I malformation ([place])    Empty sella ([place])    Irregular heart rhythm    Menorrhagia     Past Surgical History:  Procedure Laterality Date   CHOLECYSTECTOMY     TUBAL LIGATION      Family History  Problem Relation Age of Onset   Diabetes Father    Kidney disease Father        failure due to diabetes   Hypertension Mother    Heart attack Maternal Grandmother 63    Social History Social History   Tobacco Use   Smoking status: Former    Packs/day: 0.10    Years: 2.00    Pack years: 0.20    Types: Cigarettes    Quit date: 05/25/2007    Years since quitting: 14.2   Smokeless tobacco: Never  Substance Use Topics   Alcohol use: Yes    Alcohol/week: 1.0 standard drink    Types: 1 Glasses of wine per week    Comment: occaional   Drug use: No    Allergies  Allergen Reactions   Penicillins Anaphylaxis, Hives and Itching    Has patient had a PCN reaction causing immediate rash, facial/tongue/throat swelling, SOB or lightheadedness with hypotension: Yes Has patient had a PCN reaction causing severe rash involving mucus membranes or skin necrosis: No Has patient had a PCN reaction that required hospitalization No Has patient had a PCN reaction occurring within the last 10 years:  Yes If all of the above answers are "NO", then may proceed with Cephalosporin use.    Shrimp [Shellfish Allergy] Anaphylaxis, Hives and Itching   Shrimp (Diagnostic)     Current Outpatient Medications  Medication Sig Dispense Refill   diclofenac (VOLTAREN) 75 MG EC tablet Take 1 tablet (75 mg total) by mouth 2 (two) times daily. 50 tablet 2   diclofenac Sodium (VOLTAREN) 1 % GEL Apply as needed over back, shoulders, heels for pain 100 g 3   ferrous sulfate 325 (65 FE) MG tablet Take 1 tablet (325 mg total) by mouth daily with breakfast. 30 tablet 0   ferumoxytol (FERAHEME) 510 MG/17ML SOLN injection Inject 17 mLs (510 mg total) into the vein once for 1 dose. 17 mL 0   fluticasone (FLONASE) 50 MCG/ACT nasal spray Place 2 sprays into both nostrils daily. 16 g 6   ibuprofen (ADVIL) 800 MG tablet Take 1 tablet (800 mg total) by mouth 3 (three) times daily. 30 tablet 0   methocarbamol (ROBAXIN) 500 MG tablet Take 1 tablet (500 mg total) by mouth 2 (two) times daily. 10 tablet 0   ondansetron (ZOFRAN-ODT) 4 MG disintegrating tablet Take 1 tablet (4 mg total) by mouth every 8 (eight) hours as needed for nausea or vomiting.  12 tablet 0   pantoprazole (PROTONIX) 20 MG tablet Take 1 tablet (20 mg total) by mouth daily. 90 tablet 1   sertraline (ZOLOFT) 25 MG tablet Take 1 tablet (25 mg total) by mouth daily. 30 tablet 0   tirzepatide (MOUNJARO) 2.5 MG/0.5ML Pen Inject 2.5 mg into the skin once a week. 2 mL 0   triamcinolone ointment (KENALOG) 0.1 % Apply 1 application topically 2 (two) times daily. 30 g 1   Current Facility-Administered Medications  Medication Dose Route Frequency Provider Last Rate Last Admin   Acetaminophen CAPS 500 mg  500 mg Oral Once Kinnie Feil, MD        Review of Systems Review of Systems  Genitourinary:        Hx of abnormal PAP.   Last menstrual period 07/31/2021.  Physical Exam Physical Exam Vitals and nursing note reviewed. Exam conducted with a chaperone  present (Page Nicki Reaper).  Constitutional:      Appearance: She is not ill-appearing.  Pulmonary:     Effort: Pulmonary effort is normal.  Genitourinary:    Labia:        Right: No tenderness or lesion.        Left: No tenderness or lesion.      Vagina: Normal.     Cervix: No discharge.     Comments: 3 small nabothian cyst Normal vascularization No acetowhite with acetic acid Lugols iodine uptake adequate.   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 Wet prep test done. She declined STD screening. I will contact her soon with her result.  Addendum: after visit telephone call Positive yeast discussed with her. Will escribe diflucan.  She agreed with the plan.    Procedure Details  The risks and benefits of the procedure and Written informed consent obtained.  Speculum placed in vagina and excellent visualization of cervix achieved, cervix swabbed x 3 with acetic acid solution. Lugols' iodine was administered. Mongol solution was used after procedure to assure hemostasis.   Specimens: ECC  Complications: none.     Plan    Specimens labelled and sent to Pathology. Return to discuss Pathology results in 2 weeks.     Breanna Miranda 08/16/2021, 8:35 AM

## 2021-08-16 NOTE — Addendum Note (Signed)
Addended by: [name] T on: 08/16/2021 01:10 PM   Modules accepted: Orders

## 2021-08-17 NOTE — Progress Notes (Signed)
Subjective:   Patient ID: Breanna Miranda, female   DOB: 36 y.o.   MRN: [id number]   HPI Patient states she was doing well but several weeks ago she had a increase in heel pain of both feet.  States that she is working with weightbearing and that this may be contributory with moderate obesity is complicating factor   ROS      Objective:  Physical Exam  Neurovascular status intact with exquisite discomfort medial fascial band bilateral at its insertion into the calcaneus     Assessment:  Acute plantar fasciitis bilateral with inflammation fluid buildup     Plan:  H&P reviewed condition continue conservative care and I did sterile prep and injected the fascial insertion 3 mg Kenalog 5 mg Xylocaine and I advised her on night splint usage and placed on Mobic 15 mg daily.  Reappoint as symptoms indicate

## 2021-08-20 ENCOUNTER — Ambulatory Visit: Payer: Medicaid Other

## 2021-08-20 ENCOUNTER — Other Ambulatory Visit: Payer: Self-pay | Admitting: *Deleted

## 2021-08-20 MED ORDER — PANTOPRAZOLE SODIUM 20 MG PO TBEC
20.0000 mg | DELAYED_RELEASE_TABLET | Freq: Every day | ORAL | 1 refills | Status: DC
Start: 2021-08-20 — End: 2021-09-03

## 2021-08-21 ENCOUNTER — Telehealth: Payer: Self-pay | Admitting: Family Medicine

## 2021-08-21 ENCOUNTER — Ambulatory Visit (INDEPENDENT_AMBULATORY_CARE_PROVIDER_SITE_OTHER): Payer: Medicaid Other

## 2021-08-21 ENCOUNTER — Other Ambulatory Visit: Payer: Self-pay

## 2021-08-21 DIAGNOSIS — Z111 Encounter for screening for respiratory tuberculosis: Secondary | ICD-10-CM

## 2021-08-21 NOTE — Telephone Encounter (Signed)
Normal result discussed. Co-testing in a year recommended. Patient will schedule with PCP. HM update to reflect PAP in 1 yr. She has no additional questions.

## 2021-08-21 NOTE — Progress Notes (Signed)
Patient is here for a PPD placement.  PPD placed in right forearm @ 3:40 pm.  Patient will return 08/23/2021 to have PPD read. Talbot Grumbling, RN

## 2021-08-23 ENCOUNTER — Ambulatory Visit: Payer: Medicaid Other

## 2021-08-23 ENCOUNTER — Other Ambulatory Visit: Payer: Self-pay

## 2021-08-23 DIAGNOSIS — Z111 Encounter for screening for respiratory tuberculosis: Secondary | ICD-10-CM

## 2021-08-23 LAB — TB SKIN TEST
Induration: 0 mm
TB Skin Test: NEGATIVE

## 2021-08-23 NOTE — Progress Notes (Signed)
PPD Reading Note  PPD read and results entered in EpicCare.  Result: 0 mm induration.  Interpretation: Negative  Allergic reaction: no

## 2021-08-28 ENCOUNTER — Ambulatory Visit: Payer: Medicaid Other | Admitting: Family Medicine

## 2021-08-30 NOTE — Progress Notes (Signed)
? ? ?  SUBJECTIVE:  ? ?CHIEF COMPLAINT / HPI:  ? ?Hoarseness: ?Started in January. She has tried zyrtec, mucinex and flonase nasal spray since last visit one month ago. She has a history of GERD and takes protonix daily. She doesn't think her reflux has worsened. She doesn't recall getting sick before the hoarseness began. Never smoker, no unintentional weight loss, no fevers, no blood in stool or black stools.  She denies any difficulty swallowing foods. ? ?Leg pain and weakness: ?Problems started around July of last year. She finds she gets right or left leg weakness when walking almost any distance. She also has some pains in the right and left thigh when walking. She feels she sometimes gets weakness in her left and right leg which happens to both legs. She feels like the left one always gives out first. She tried antiinflammatory without benefit.  She denies a history of back pains. ? ?PERTINENT  PMH / PSH: GERD ? ?OBJECTIVE:  ? ?BP 90/60   Pulse 63   Ht '5\' 4"'$  (1.626 m)   Wt 206 lb 8 oz (93.7 kg)   LMP 08/23/2021   SpO2 96%   BMI 35.45 kg/m?   ? ?General: NAD, pleasant, able to participate in exam ?HEENT: No pharyngeal erythema, no cervical lymphadenopathy. ?Cardiac: RRR, no murmurs. ?Respiratory: CTAB, normal effort ?MSK: Hip flexor, knee flexor, knee extensor testing 5/5 bilaterally, she does have pain when palpating the anterior thigh and shin.  I did observe her ambulating with some internal rotation of the lower foot when walking but no difficulty with stability. ? ?ASSESSMENT/PLAN:  ? ?Hoarseness: ?Has been going on for 2 months.  Trial of Flonase, Zyrtec did not improve her symptoms.  She does take pantoprazole and does not feel she has had a worsening of reflux symptoms but does have a history of reflux.  She is a never smoker.  No unintentional weight loss or red flag symptoms.  She does continue to experience postnasal drip but having no experience with her hoarseness after starting Flonase  suggest another cause.  We will provide a trial of famotidine to use in addition to her PPI.  We will place referral for ENT. ? ?Bilateral lower extremity weakness: ?Seems to fluctuate per previous documentation.  Is associated with painful soft touch throughout the thighs and lower extremities.  She denies any upper extremity symptoms.  Denies back pains.  On strength testing today her strength is 5/5 bilaterally in the lower extremities.  I did observe her ambulating without difficulty suggesting against a constant problem such as constant neuromuscular weakness.  Per chart review this seems to be an ongoing problem which occurs from time to time.  Recommended keeping a journal when the symptoms occur.  We will check a CK level today. ? ? ?Lurline Del, DO ?[facility]  ? ? ? ?

## 2021-08-31 ENCOUNTER — Other Ambulatory Visit: Payer: Self-pay | Admitting: Family Medicine

## 2021-08-31 ENCOUNTER — Encounter: Payer: Self-pay | Admitting: Family Medicine

## 2021-08-31 ENCOUNTER — Ambulatory Visit (INDEPENDENT_AMBULATORY_CARE_PROVIDER_SITE_OTHER): Payer: Medicaid Other | Admitting: Family Medicine

## 2021-08-31 ENCOUNTER — Other Ambulatory Visit: Payer: Self-pay

## 2021-08-31 VITALS — BP 90/60 | HR 63 | Ht 64.0 in | Wt 206.5 lb

## 2021-08-31 DIAGNOSIS — R490 Dysphonia: Secondary | ICD-10-CM | POA: Diagnosis not present

## 2021-08-31 DIAGNOSIS — R29898 Other symptoms and signs involving the musculoskeletal system: Secondary | ICD-10-CM

## 2021-08-31 MED ORDER — FAMOTIDINE 20 MG PO TABS: 20.0000 mg | ORAL_TABLET | Freq: Two times a day (BID) | ORAL | 0 refills | Status: DC

## 2021-08-31 NOTE — Patient Instructions (Signed)
For your leg weakness and wanted to keep a journal of when the symptoms occur, any medications you have taken before that, and any other possible effects that could cause it such as poor sleep, poor hydration, etc.  We will going check a lab for this today. ? ?For your hoarseness on prescribing another reflux medicine for you to take for the next 1 month or so.  I placed a referral for ENT and they should call you in the next 1 week to set up an appointment. ? ?Recommend following up with your primary doctor in about 1 month to see how your symptoms are doing. ?

## 2021-09-01 LAB — CK: Total CK: 103 U/L (ref 32–182)

## 2021-09-03 ENCOUNTER — Other Ambulatory Visit: Payer: Self-pay | Admitting: Family Medicine

## 2021-09-03 MED ORDER — PANTOPRAZOLE SODIUM 40 MG PO TBEC: 40.0000 mg | DELAYED_RELEASE_TABLET | Freq: Every day | ORAL | 3 refills | Status: DC

## 2021-10-01 ENCOUNTER — Ambulatory Visit: Payer: Medicaid Other | Admitting: Podiatry

## 2021-10-03 ENCOUNTER — Ambulatory Visit: Payer: Medicaid Other | Admitting: Family Medicine

## 2021-10-15 ENCOUNTER — Encounter: Payer: Self-pay | Admitting: Podiatry

## 2021-10-15 ENCOUNTER — Ambulatory Visit (INDEPENDENT_AMBULATORY_CARE_PROVIDER_SITE_OTHER): Payer: Medicaid Other | Admitting: Podiatry

## 2021-10-15 DIAGNOSIS — M722 Plantar fascial fibromatosis: Secondary | ICD-10-CM

## 2021-10-15 MED ORDER — GABAPENTIN 300 MG PO CAPS: 300.0000 mg | ORAL_CAPSULE | Freq: Three times a day (TID) | ORAL | 3 refills | Status: DC

## 2021-10-17 NOTE — Progress Notes (Signed)
Subjective:  ? ?Patient ID: Breanna Miranda, female   DOB: 36 y.o.   MRN: [id number]  ? ?HPI ?Patient states she was doing well but over the last couple weeks the pain has come back and she is using the night splints which are helping.  She seems also to be getting a lot more nerve type pains at nighttime ? ? ?ROS ? ? ?   ?Objective:  ?Physical Exam  ?Neurovascular status intact continues to have discomfort in the plantar fascia bilateral but seems to have pain down through her feet in a more diffuse-like pattern with difficulty in identifying specifics. ? ?   ?Assessment:  ?Cannot rule out that there may not be some low-grade neuropathy as she also has back issues along with his acute fasciitis ? ?   ?Plan:  ?I would like to avoid having to do further injections but we will do 1 more today with sterile prep done and fascial injections done.  Surgery is not a good option currently even though at 1 point in future may be necessary but would like to avoid it and I Minna start her on gabapentin to see if this will make a difference with the nervelike sensations that she is experiencing and hopefully this will help the pain continue with her night splints ?   ? ? ?

## 2021-10-19 ENCOUNTER — Encounter: Payer: Self-pay | Admitting: Family Medicine

## 2021-10-19 MED ORDER — PANTOPRAZOLE SODIUM 40 MG PO TBEC: 40.0000 mg | DELAYED_RELEASE_TABLET | Freq: Every day | ORAL | 3 refills | Status: DC

## 2021-10-24 ENCOUNTER — Encounter: Payer: Self-pay | Admitting: Podiatry

## 2021-10-24 ENCOUNTER — Ambulatory Visit (INDEPENDENT_AMBULATORY_CARE_PROVIDER_SITE_OTHER): Payer: Medicaid Other | Admitting: Podiatry

## 2021-10-24 DIAGNOSIS — M7672 Peroneal tendinitis, left leg: Secondary | ICD-10-CM

## 2021-10-24 DIAGNOSIS — M7671 Peroneal tendinitis, right leg: Secondary | ICD-10-CM

## 2021-10-24 MED ORDER — TRIAMCINOLONE ACETONIDE 10 MG/ML IJ SUSP
20.0000 mg | Freq: Once | INTRAMUSCULAR | Status: AC
  Administered 2021-10-24: 20 mg

## 2021-10-25 NOTE — Progress Notes (Signed)
Subjective:  ? ?Patient ID: Breanna Miranda, female   DOB: 36 y.o.   MRN: [id number]  ? ?HPI ?Patient states she is doing well as far as the pain on the insides of her heels currently but has developed a lot of pain in the outside where she thinks she was walking differently ? ? ?ROS ? ? ?   ?Objective:  ?Physical Exam  ?Neurovascular status intact with improved medial pain of the heel region bilateral with pain in the lateral side around the peroneal where she was probably walking on the outsides of her feet and was not realizing this ? ?   ?Assessment:  ?Appears to be peroneal tendinitis bilateral ? ?   ?Plan:  ?Sterile prep and went ahead today and explained injections and rupture and she is willing to accept the risk of doing these injections and I did do sterile prep and injected the sheath the peroneal tendon as it comes under the malleolus distal 3 mg Dexasone Kenalog 5 mg into the sheath not into the tendon itself.  Reappoint as needed ?   ? ? ?

## 2021-10-30 ENCOUNTER — Encounter: Payer: Self-pay | Admitting: Family Medicine

## 2021-10-30 DIAGNOSIS — G8929 Other chronic pain: Secondary | ICD-10-CM

## 2021-10-30 DIAGNOSIS — K219 Gastro-esophageal reflux disease without esophagitis: Secondary | ICD-10-CM

## 2021-10-30 DIAGNOSIS — G6289 Other specified polyneuropathies: Secondary | ICD-10-CM

## 2021-10-30 MED ORDER — DICLOFENAC SODIUM 1 % EX GEL: CUTANEOUS | 3 refills | Status: DC

## 2021-10-30 MED ORDER — PANTOPRAZOLE SODIUM 40 MG PO TBEC: 40.0000 mg | DELAYED_RELEASE_TABLET | Freq: Every day | ORAL | 3 refills | Status: DC

## 2021-11-27 ENCOUNTER — Encounter: Payer: Self-pay | Admitting: *Deleted

## 2021-12-16 ENCOUNTER — Ambulatory Visit (INDEPENDENT_AMBULATORY_CARE_PROVIDER_SITE_OTHER): Payer: Medicaid Other

## 2021-12-16 ENCOUNTER — Encounter (HOSPITAL_COMMUNITY): Payer: Self-pay | Admitting: Emergency Medicine

## 2021-12-16 ENCOUNTER — Ambulatory Visit (HOSPITAL_COMMUNITY)
Admission: EM | Admit: 2021-12-16 | Discharge: 2021-12-16 | Disposition: A | Discharge status: Home / Self Care | Payer: Medicaid Other | Attending: Emergency Medicine | Admitting: Emergency Medicine

## 2021-12-16 DIAGNOSIS — W19XXXA Unspecified fall, initial encounter: Secondary | ICD-10-CM | POA: Diagnosis not present

## 2021-12-16 DIAGNOSIS — M546 Pain in thoracic spine: Secondary | ICD-10-CM

## 2021-12-16 MED ORDER — CYCLOBENZAPRINE HCL 10 MG PO TABS: 10.0000 mg | ORAL_TABLET | Freq: Two times a day (BID) | ORAL | 0 refills | Status: DC | PRN

## 2021-12-16 MED ORDER — IBUPROFEN 800 MG PO TABS
800.0000 mg | ORAL_TABLET | Freq: Three times a day (TID) | ORAL | 0 refills | Status: DC
Start: 2021-12-16 — End: 2022-04-17

## 2021-12-21 DIAGNOSIS — H5213 Myopia, bilateral: Secondary | ICD-10-CM | POA: Diagnosis not present

## 2022-01-22 ENCOUNTER — Other Ambulatory Visit: Payer: Self-pay | Admitting: Family Medicine

## 2022-01-22 ENCOUNTER — Telehealth: Payer: Self-pay

## 2022-01-22 DIAGNOSIS — Z1231 Encounter for screening mammogram for malignant neoplasm of breast: Secondary | ICD-10-CM

## 2022-01-22 NOTE — Telephone Encounter (Signed)
Patient calls nurse line requesting to schedule appointment for heart fluttering. Reports last episode on Sunday. Reports that it "comes out of no where." Reports that on Sunday she experienced dizziness associated with these palpitations.   Reports that this has happened in the past, however, is unsure of the cause.   Patient is not currently experiencing chest pain or SHOB.   Scheduled patient for an appointment on 8/3 with Dr. Joeseph Amor. ED precautions discussed.   Talbot Grumbling, RN

## 2022-01-24 ENCOUNTER — Encounter: Payer: Self-pay | Admitting: Family Medicine

## 2022-01-24 ENCOUNTER — Ambulatory Visit (HOSPITAL_COMMUNITY): Payer: Medicaid Other | Attending: Family Medicine

## 2022-01-24 ENCOUNTER — Ambulatory Visit (INDEPENDENT_AMBULATORY_CARE_PROVIDER_SITE_OTHER): Payer: Medicaid Other | Admitting: Family Medicine

## 2022-01-24 VITALS — BP 120/60 | HR 90 | Ht 64.0 in | Wt 210.4 lb

## 2022-01-24 DIAGNOSIS — N920 Excessive and frequent menstruation with regular cycle: Secondary | ICD-10-CM | POA: Diagnosis not present

## 2022-01-24 DIAGNOSIS — I440 Atrioventricular block, first degree: Secondary | ICD-10-CM | POA: Diagnosis not present

## 2022-01-24 DIAGNOSIS — R002 Palpitations: Secondary | ICD-10-CM | POA: Diagnosis not present

## 2022-01-24 DIAGNOSIS — R9431 Abnormal electrocardiogram [ECG] [EKG]: Secondary | ICD-10-CM | POA: Insufficient documentation

## 2022-01-24 MED ORDER — PANTOPRAZOLE SODIUM 40 MG PO TBEC: 40.0000 mg | DELAYED_RELEASE_TABLET | Freq: Every day | ORAL | 1 refills | Status: DC

## 2022-01-24 MED ORDER — FLUTICASONE PROPIONATE 50 MCG/ACT NA SUSP: 2.0000 | Freq: Every day | NASAL | 0 refills | Status: DC

## 2022-01-24 NOTE — Assessment & Plan Note (Addendum)
-  PR interval 255m on EKG today -No intervention needed, unlikely source of her symptoms

## 2022-01-24 NOTE — Patient Instructions (Addendum)
It was great to see you today! Thank you for choosing [facility] for your primary care. Breanna Miranda was seen for palpitations/heart fluttering.  -It is likely that your heart palpitations are related to your anemia. We are checking your blood level today. We are also checking your thyroid levels. I will give you a call if any of these are abnormal.  -For your heavy periods, I have placed a referral for you to see gynecology. They can discuss other options to control your bleeding or potentially any procedures you may need.  -Please follow up in about 2 weeks so we can see how you are doing and make any changes as necessary.  -An EKG was done that showed a 1st degree AV block. There is nothing that needs to be done about this right now. Please review the attached information if you'd like.  If you do not hear about your labs in the next 2 weeks, please call the office.   You should return to our clinic - Return in about 2 weeks (around 02/07/2022) for follow up for heart palpitations.  I recommend that you always bring your medications to each appointment as this makes it easy to ensure you are on the correct medications and helps [place] not miss refills when you need them.  Please arrive 15 minutes before your appointment to ensure smooth check in process.  We appreciate your efforts in making this happen.  Please call the clinic at [phone] if your symptoms worsen or you have any concerns.  Thank you for allowing me to participate in your care, Breanna Albino, MD 01/24/2022, 3:03 PM PGY-1, [place]

## 2022-01-24 NOTE — Assessment & Plan Note (Addendum)
-  CBC, Ferritin, TSH w reflex T4 -Gyn referral for heavy periods

## 2022-01-24 NOTE — Assessment & Plan Note (Addendum)
-  Referral to gynecology given hx of fibroids, OCP trial, and anemia requiring transfusion -Has soft blood pressures -Check CBC today

## 2022-01-24 NOTE — Progress Notes (Signed)
    SUBJECTIVE:   CHIEF COMPLAINT / HPI:   Palpitations -Heart fluttering ongoing x3 weeks. Episodes occur about 3 times per day and last about 2-3 minutes. Usually occur at work while she is up and walking around. Associated w/ dizziness, goes away after a few minutes of sitting and deep breathing.  -Has had similar symptoms in the past, was found to have anemia, required blood transfusion -Not associated w anxiety or stressful situations -No regular caffeine use -Endorses heavy periods  Menorrhagia: -Periods every 22 days, last 7 days -Each day goes through about 11-12 pads -First 3 days are the heaviest  -Very painful periods, worst on the first 2 days. Tylenol helps a little but not a lot -Has tried OCPs in the past, made bleeding worse so dc'd -No bloody stools or urine -Has been taking iron pills every morning. Takes Protonix every day as well  PERTINENT  PMH / PSH: Anemia, anxiety  OBJECTIVE:   BP 120/60   Pulse 90   Ht '5\' 4"'$  (1.626 m)   Wt 210 lb 6.4 oz (95.4 kg)   LMP 01/19/2022   SpO2 100%   BMI 36.12 kg/m   Gen: Well appearing, alert CV: RRR, no m/r/g Pulm: CTAB, normal WOB Abd: Mildly TTP in suprapubic region  ASSESSMENT/PLAN:   Ms. Rummell is here today to discuss her palpitations. Given her history of heavy and painful periods, as well as anemia requiring transfusion, I suspect that her anemia has worsened and she is in a high-output cardiac state that is driving her recurrent palpitations. Her EKG showed a 1st degree AV block that is unlikely to be causing her symptoms. There was no evidence of an irregular rhythm such as atrial fibrillation or flutter, although it is possible she is having episodes of paroxysmal SVT or premature contractions. Will defer cardiac monitoring for now, given her likely cause of anemia, but would like to see her back in 2-3 weeks to further investigate and potentially have her set up with a Zio monitor if still ongoing. Will also  check ferritin level given hx of IDA as well as TSH given family hx of thyroid issues.  Palpitations -CBC, Ferritin, TSH w reflex T4 -Gyn referral for heavy periods  Menorrhagia -Referral to gynecology given hx of fibroids, OCP trial, and anemia requiring transfusion -Has soft blood pressures -Check CBC today  1st degree AV block -PR interval 238m on EKG today -No intervention needed, unlikely source of her symptoms     AAugust Albino MD [facility]

## 2022-01-24 NOTE — Addendum Note (Signed)
Addended byJoeseph Amor, Wasil Wolke on: 01/24/2022 05:46 PM   Modules accepted: Orders

## 2022-01-25 ENCOUNTER — Emergency Department (HOSPITAL_COMMUNITY): Payer: Medicaid Other

## 2022-01-25 ENCOUNTER — Other Ambulatory Visit: Payer: Self-pay

## 2022-01-25 ENCOUNTER — Encounter (HOSPITAL_COMMUNITY): Payer: Self-pay

## 2022-01-25 ENCOUNTER — Emergency Department (HOSPITAL_COMMUNITY)
Admission: EM | Admit: 2022-01-25 | Discharge: 2022-01-25 | Disposition: A | Discharge status: Home / Self Care | Payer: Medicaid Other | Attending: Emergency Medicine | Admitting: Emergency Medicine

## 2022-01-25 ENCOUNTER — Telehealth: Payer: Self-pay | Admitting: Family Medicine

## 2022-01-25 DIAGNOSIS — D649 Anemia, unspecified: Secondary | ICD-10-CM | POA: Insufficient documentation

## 2022-01-25 DIAGNOSIS — R9389 Abnormal findings on diagnostic imaging of other specified body structures: Secondary | ICD-10-CM | POA: Diagnosis not present

## 2022-01-25 DIAGNOSIS — N938 Other specified abnormal uterine and vaginal bleeding: Secondary | ICD-10-CM | POA: Diagnosis not present

## 2022-01-25 DIAGNOSIS — N939 Abnormal uterine and vaginal bleeding, unspecified: Secondary | ICD-10-CM | POA: Insufficient documentation

## 2022-01-25 DIAGNOSIS — R079 Chest pain, unspecified: Secondary | ICD-10-CM | POA: Diagnosis not present

## 2022-01-25 DIAGNOSIS — D259 Leiomyoma of uterus, unspecified: Secondary | ICD-10-CM | POA: Insufficient documentation

## 2022-01-25 DIAGNOSIS — R0789 Other chest pain: Secondary | ICD-10-CM | POA: Diagnosis not present

## 2022-01-25 DIAGNOSIS — N83202 Unspecified ovarian cyst, left side: Secondary | ICD-10-CM | POA: Diagnosis not present

## 2022-01-25 LAB — CBC
HCT: 22.9 % — ABNORMAL LOW (ref 36.0–46.0)
Hematocrit: 24.8 % — ABNORMAL LOW (ref 34.0–46.6)
Hemoglobin: 6.9 g/dL — CL (ref 11.1–15.9)
Hemoglobin: 6.7 g/dL — CL (ref 12.0–15.0)
MCH: 18.3 pg — ABNORMAL LOW (ref 26.6–33.0)
MCH: 18.8 pg — ABNORMAL LOW (ref 26.0–34.0)
MCHC: 27.8 g/dL — ABNORMAL LOW (ref 31.5–35.7)
MCHC: 29.3 g/dL — ABNORMAL LOW (ref 30.0–36.0)
MCV: 66 fL — ABNORMAL LOW (ref 79–97)
MCV: 64.3 fL — ABNORMAL LOW (ref 80.0–100.0)
Platelets: 384 10*3/uL (ref 150–450)
Platelets: 329 10*3/uL (ref 150–400)
RBC: 3.78 x10E6/uL (ref 3.77–5.28)
RBC: 3.56 MIL/uL — ABNORMAL LOW (ref 3.87–5.11)
RDW: 19 % — ABNORMAL HIGH (ref 11.7–15.4)
RDW: 20 % — ABNORMAL HIGH (ref 11.5–15.5)
WBC: 4.9 10*3/uL (ref 3.4–10.8)
WBC: 5.1 10*3/uL (ref 4.0–10.5)
nRBC: 0.4 % — ABNORMAL HIGH (ref 0.0–0.2)

## 2022-01-25 LAB — BASIC METABOLIC PANEL
Anion gap: 8 (ref 5–15)
BUN: 5 mg/dL — ABNORMAL LOW (ref 6–20)
CO2: 21 mmol/L — ABNORMAL LOW (ref 22–32)
Calcium: 8.6 mg/dL — ABNORMAL LOW (ref 8.9–10.3)
Chloride: 107 mmol/L (ref 98–111)
Creatinine, Ser: 0.79 mg/dL (ref 0.44–1.00)
GFR, Estimated: >60 mL/min (ref >60)
Glucose, Bld: 115 mg/dL — ABNORMAL HIGH (ref 70–99)
Potassium: 3.4 mmol/L — ABNORMAL LOW (ref 3.5–5.1)
Sodium: 136 mmol/L (ref 135–145)

## 2022-01-25 LAB — I-STAT BETA HCG BLOOD, ED (MC, WL, AP ONLY): I-stat hCG, quantitative: <5 m[IU]/mL (ref <5)

## 2022-01-25 LAB — PREPARE RBC (CROSSMATCH)

## 2022-01-25 LAB — FERRITIN: Ferritin: 9 ng/mL — ABNORMAL LOW (ref 15–150)

## 2022-01-25 LAB — TROPONIN I (HIGH SENSITIVITY): Troponin I (High Sensitivity): 3 ng/L (ref <18)

## 2022-01-25 LAB — TSH RFX ON ABNORMAL TO FREE T4: TSH: 1.49 u[IU]/mL (ref 0.450–4.500)

## 2022-01-25 MED ORDER — TRANEXAMIC ACID 650 MG PO TABS: 1300.0000 mg | ORAL_TABLET | Freq: Three times a day (TID) | ORAL | 0 refills | Status: DC

## 2022-01-25 MED ORDER — SODIUM CHLORIDE 0.9 % IV SOLN
10.0000 mL/h | Freq: Once | INTRAVENOUS | Status: AC
  Administered 2022-01-25: 10 mL/h via INTRAVENOUS

## 2022-01-25 NOTE — ED Notes (Signed)
Patient transported to Ultrasound 

## 2022-01-25 NOTE — ED Provider Notes (Signed)
[place] EMERGENCY DEPARTMENT Provider Note   CSN: [id number] Arrival date & time: 01/25/22  0857     History  Chief Complaint  Patient presents with   Chest Pain    Breanna Miranda is a 36 y.o. female.  Pt is a 36 yo female with a pmhx significant for dub and anemia.  She went to her pcp yesterday for palpitations.  She was concerned she was anemic as she's had this problem with anemia in the past.  Pt has a hx of heavy periods and a hx of fibroids.  She has had to have a blood transfusion in the past.  Pt has used OCPs in the past for her heavy periods.  However, it made her bleeding worse. Pt was referred to gyn, but labs came back today with a hgb of 6.9.  Pt is not currently bleeding.         Home Medications Prior to Admission medications   Medication Sig Start Date End Date Taking? Authorizing Provider  acetaminophen (TYLENOL) 500 MG tablet Take 1,000 mg by mouth every 6 (six) hours as needed for mild pain.   Yes [provider]  ferrous sulfate 325 (65 FE) MG tablet Take 325 mg by mouth daily with breakfast.   Yes [provider]  fluticasone (FLONASE) 50 MCG/ACT nasal spray Place 2 sprays into both nostrils daily. Patient taking differently: Place 2 sprays into both nostrils daily as needed for allergies. 01/24/22  Yes [name], MD  ibuprofen (ADVIL) 800 MG tablet Take 1 tablet (800 mg total) by mouth 3 (three) times daily. Patient taking differently: Take 800 mg by mouth every 8 (eight) hours as needed for mild pain. 12/16/21  Yes White, Adrienne R, NP  Lactobacillus (PROBIOTIC ACIDOPHILUS PO) Take 1 tablet by mouth daily.   Yes [provider]  Multiple Vitamin (MULTIVITAMIN) capsule Take 1 capsule by mouth daily.   Yes [provider]  pantoprazole (PROTONIX) 40 MG tablet Take 1 tablet (40 mg total) by mouth daily. 01/24/22  Yes [name], MD  tranexamic acid (LYSTEDA) 650 MG TABS tablet Take 2 tablets  (1,300 mg total) by mouth 3 (three) times daily. Take at the onset of monthly menstrual bleeding 01/25/22  Yes Isla Pence, MD  zinc gluconate 50 MG tablet Take 50 mg by mouth daily.   Yes [provider]  cyclobenzaprine (FLEXERIL) 10 MG tablet Take 1 tablet (10 mg total) by mouth 2 (two) times daily as needed for muscle spasms. Patient not taking: Reported on 01/25/2022 12/16/21   Hans Eden, NP  montelukast (SINGULAIR) 10 MG tablet Take 1 tablet (10 mg total) by mouth at bedtime. 05/12/18 08/02/19  Kinnie Feil, MD      Allergies    Penicillins, Shrimp [shellfish allergy], and Shrimp (diagnostic)    Review of Systems   Review of Systems  Cardiovascular:  Positive for palpitations.  All other systems reviewed and are negative.   Physical Exam Updated Vital Signs BP 91/66   Pulse 67   Temp 98.6 F (37 C) (Oral)   Resp (!) 21   Ht '5\' 4"'$  (1.626 m)   Wt 95.4 kg   LMP 01/25/2022   SpO2 100%   BMI 36.11 kg/m  Physical Exam Vitals and nursing note reviewed.  Constitutional:      Appearance: She is well-developed.  HENT:     Head: Normocephalic and atraumatic.  Eyes:     Extraocular Movements: Extraocular movements intact.  Pupils: Pupils are equal, round, and reactive to light.  Cardiovascular:     Rate and Rhythm: Normal rate and regular rhythm.     Heart sounds: Normal heart sounds.  Pulmonary:     Effort: Pulmonary effort is normal.     Breath sounds: Normal breath sounds.  Musculoskeletal:        General: Normal range of motion.     Cervical back: Normal range of motion and neck supple.  Skin:    General: Skin is warm.     Capillary Refill: Capillary refill takes less than 2 seconds.  Neurological:     General: No focal deficit present.     Mental Status: She is alert and oriented to person, place, and time.  Psychiatric:        Mood and Affect: Mood normal. Affect is tearful.        Behavior: Behavior normal.     ED Results / Procedures  / Treatments   Labs (all labs ordered are listed, but only abnormal results are displayed) Labs Reviewed  BASIC METABOLIC PANEL - Abnormal; Notable for the following components:      Result Value   Potassium 3.4 (*)    CO2 21 (*)    Glucose, Bld 115 (*)    BUN 5 (*)    Calcium 8.6 (*)    All other components within normal limits  CBC - Abnormal; Notable for the following components:   RBC 3.56 (*)    Hemoglobin 6.7 (*)    HCT 22.9 (*)    MCV 64.3 (*)    MCH 18.8 (*)    MCHC 29.3 (*)    RDW 20.0 (*)    nRBC 0.4 (*)    All other components within normal limits  I-STAT BETA HCG BLOOD, ED ([facility], [facility], [facility] ONLY)  TYPE AND SCREEN  PREPARE RBC (CROSSMATCH)  TROPONIN I (HIGH SENSITIVITY)  TROPONIN I (HIGH SENSITIVITY)    EKG EKG Interpretation  Date/Time:  Friday January 25 2022 12:25:57 EDT Ventricular Rate:  73 PR Interval:  194 QRS Duration: 80 QT Interval:  398 QTC Calculation: 439 R Axis:   42 Text Interpretation: Sinus rhythm 1st degree AV block resolved. Confirmed by Isla Pence [phone]) on 01/25/2022 12:38:38 PM  Radiology US PELVIC COMPLETE WITH TRANSVAGINAL  Result Date: 01/25/2022 CLINICAL DATA:  Dysfunctional uterine bleeding, unknown LMP EXAM: TRANSABDOMINAL AND TRANSVAGINAL ULTRASOUND OF PELVIS TECHNIQUE: Both transabdominal and transvaginal ultrasound examinations of the pelvis were performed. Transabdominal technique was performed for global imaging of the pelvis including uterus, ovaries, adnexal regions, and pelvic cul-de-sac. It was necessary to proceed with endovaginal exam following the transabdominal exam to visualize the RIGHT ovary. COMPARISON:  11/05/2017 FINDINGS: Uterus Measurements: 13.2 x 8.7 x 9.4 cm = volume: 570 mL. Heterogeneous myometrium. Large exophytic leiomyoma anterior fundus 5.8 cm. Smaller fundal leiomyomata 3.0 cm and 3.3 cm noted. Endometrium Thickness: 21 mm.  Mildly heterogeneous.  No focal mass or fluid Right ovary Measurements: 3.4 x 1.9 x  2.7 cm = volume: 9.2 mL. Normal morphology without mass Left ovary Measurements: 3.6 x 2.9 x 4.1 cm = volume: 22.1 mL. Simple cyst LEFT ovary 3.4 cm diameter; no follow-up imaging recommended Other findings No free pelvic fluid or adnexal masses. IMPRESSION: Multiple uterine leiomyomata, largest 5.8 cm diameter exophytic at anterior upper uterus. Thickened endometrial complex 21 mm thick; if bleeding remains unresponsive to hormonal or medical therapy, focal lesion work-up with sonohysterogram should be considered. Endometrial biopsy should also be  considered in pre-menopausal patients at high risk for endometrial carcinoma. (Ref: Radiological Reasoning: Algorithmic Workup of Abnormal Vaginal Bleeding with Endovaginal Sonography and Sonohysterography. AJR 2008; 865:H84-69). Electronically Signed   By: Lavonia Dana M.D.   On: 01/25/2022 11:01   DG Chest Port 1 View  Result Date: 01/25/2022 CLINICAL DATA:  Chest pain EXAM: PORTABLE CHEST 1 VIEW COMPARISON:  None Available. FINDINGS: The heart size and mediastinal contours are within normal limits. Both lungs are clear. No pleural effusion or pneumothorax. The visualized skeletal structures are unremarkable. IMPRESSION: No active disease. Electronically Signed   By: Macy Mis M.D.   On: 01/25/2022 09:35    Procedures Procedures    Medications Ordered in ED Medications  0.9 %  sodium chloride infusion (10 mL/hr Intravenous New Bag/Given 01/25/22 1100)    ED Course/ Medical Decision Making/ A&P                           Medical Decision Making Amount and/or Complexity of Data Reviewed Labs: ordered. Radiology: ordered.  Risk Prescription drug management.   This patient presents to the ED for concern of palpitations, this involves an extensive number of treatment options, and is a complaint that carries with it a high risk of complications and morbidity.  The differential diagnosis includes cardiac, pulm, anemia   Co morbidities that  complicate the patient evaluation  dub   Additional history obtained:  Additional history obtained from epic chart review External records from outside source obtained and reviewed including family   Lab Tests:  I Ordered, and personally interpreted labs.  The pertinent results include:  cbc with hgb 6.7; bmp nl, preg neg; trop neg   Imaging Studies ordered:  I ordered imaging studies including cxr and pelvis US I independently visualized and interpreted imaging which showed  IMPRESSION:  No active disease.  Pelvic US: IMPRESSION:  Multiple uterine leiomyomata, largest 5.8 cm diameter exophytic at  anterior upper uterus.    Thickened endometrial complex 21 mm thick; if bleeding remains  unresponsive to hormonal or medical therapy, focal lesion work-up  with sonohysterogram should be considered. Endometrial biopsy should  also be considered in pre-menopausal patients at high risk for  endometrial carcinoma. (Ref: Radiological Reasoning: Algorithmic  Workup of Abnormal Vaginal Bleeding with Endovaginal Sonography and  Sonohysterography. AJR 2008; 629:B28-41).   I agree with the radiologist interpretation   Cardiac Monitoring:  The patient was maintained on a cardiac monitor.  I personally viewed and interpreted the cardiac monitored which showed an underlying rhythm of: nsr   Medicines ordered and prescription drug management:  I ordered medication including blood  for symptomatic anemia  Reevaluation of the patient after these medicines showed that the patient improved I have reviewed the patients home medicines and have made adjustments as needed   Test Considered:  Pelvic US   Critical Interventions:  transfusion   Consultations Obtained:  I requested consultation with the obgyn (Dr. Elgie Congo),  and discussed lab and imaging findings as well as pertinent plan -he recommends pelvic US and oral txa.  Pt needs to be seen by gyn for f/u.    Problem List / ED  Course:  Symptomatic anemia:  pt given 1 unit prbc.  Anemia is from DUB due to fibroids.  Pt also has endometrial hyperplasia and will need gyn f/u for possible bx.  Pt feels much better after receiving blood.  Pt just started taking iron pills yesterday.  She  is told to continue the iron.  Pcp gave pt a referral to gyn yesterday.  I will give her the number to the women[facility] clinic. CP:  atypical.  Resolved.  Work up neg for cardiac issue.   Reevaluation:  After the interventions noted above, I reevaluated the patient and found that they have :improved   Social Determinants of Health:  Lives at home   Dispostion:  After consideration of the diagnostic results and the patients response to treatment, I feel that the patent would benefit from discharge with outpatient f/u.    CRITICAL CARE Performed by: Isla Pence   Total critical care time: 30 minutes  Critical care time was exclusive of separately billable procedures and treating other patients.  Critical care was necessary to treat or prevent imminent or life-threatening deterioration.  Critical care was time spent personally by me on the following activities: development of treatment plan with patient and/or surrogate as well as nursing, discussions with consultants, evaluation of patient's response to treatment, examination of patient, obtaining history from patient or surrogate, ordering and performing treatments and interventions, ordering and review of laboratory studies, ordering and review of radiographic studies, pulse oximetry and re-evaluation of patient's condition.         Final Clinical Impression(s) / ED Diagnoses Final diagnoses:  DUB (dysfunctional uterine bleeding)  Atypical chest pain  Symptomatic anemia  Uterine leiomyoma, unspecified location    Rx / DC Orders ED Discharge Orders          Ordered    tranexamic acid (LYSTEDA) 650 MG TABS tablet  3 times daily        01/25/22 0355               Isla Pence, MD 01/25/22 1333

## 2022-01-25 NOTE — Telephone Encounter (Signed)
Called patient to inform that her hemoglobin was 6.9 and she will need  to go to the ED for possible transfusion. Patient expressed understanding and stated she will go immediately. She is at work currently and feels ok to drive. Advised to call ambulance if feeling dizzy or unable to drive. Also informed about low ferritin.  Patient seen in clinic yesterday for palpitations and hx of menorrhagia.

## 2022-01-25 NOTE — ED Triage Notes (Signed)
Went to PCP yesterday for CP and SOB and weakness blood work drawn. MD called her this morning ans stated she needed to come here for low Hgb and pt states they told her something about a heart blockage.  Alert oriented.  States has heavy cycles one one now and has had a transfusion in the past

## 2022-01-26 LAB — TYPE AND SCREEN
ABO/RH(D): B POS
Antibody Screen: NEGATIVE
Unit division: 0

## 2022-01-26 LAB — BPAM RBC
Blood Product Expiration Date: 202308182359
ISSUE DATE / TIME: 202308041044
Unit Type and Rh: 7300

## 2022-02-01 ENCOUNTER — Ambulatory Visit (INDEPENDENT_AMBULATORY_CARE_PROVIDER_SITE_OTHER): Payer: Medicaid Other | Admitting: Obstetrics and Gynecology

## 2022-02-01 ENCOUNTER — Encounter: Payer: Self-pay | Admitting: Obstetrics and Gynecology

## 2022-02-01 VITALS — BP 106/69 | HR 66 | Ht 64.0 in | Wt 208.6 lb

## 2022-02-01 DIAGNOSIS — N920 Excessive and frequent menstruation with regular cycle: Secondary | ICD-10-CM | POA: Diagnosis not present

## 2022-02-01 DIAGNOSIS — N938 Other specified abnormal uterine and vaginal bleeding: Secondary | ICD-10-CM

## 2022-02-01 DIAGNOSIS — D259 Leiomyoma of uterus, unspecified: Secondary | ICD-10-CM

## 2022-02-01 NOTE — Progress Notes (Signed)
Breanna Miranda presents for eval of [facility] and uterine fibroids. Has had problem for several yrs. Tried OCP's in the past. Made Sx worse  H/O IDA secondary to [facility] Has been hospitalized 3 times for blood transfusion, last being the first of this month.  GYN U/S, uterine fibroids, largest @ 6 cm, vol 570 gms  Pap smear UTD  Denies any chromic medical problems or medications  TSVD x 4 ( Largest 7 # 6 oz) Sab x 1   PE AF VSS Chaperone present  Lungs clear Heart RRR Abd soft + BS GU Nl EGBUS, cervix no lesions, uterus 10-12 weeks, mobile, slightly tender, no adnexal masses  A/P [facility]        Uterine Fibroids        IDA secondary to above.  Dx reviewed with pt. Treatment options reviewed. Pt undecided at this time. Information on fibroids, IUD and vag hyst provided. Pt has Rx for Lysteda, instructed to start with next cycle. Instructed to call office with choice, o/w F/U PRN

## 2022-02-01 NOTE — Patient Instructions (Addendum)
Uterine Fibroids  Uterine fibroids are lumps of tissue (tumors) in the womb (uterus). Fibroids are not cancerous. Most women with this condition do not need treatment. Sometimes, fibroids can make it harder to have children. If this happens, you may need surgery to take out the fibroids. What are the causes? The cause of this condition is not known. What increases the risk? You are in your 30s or 40s and have not gone through menopause. Menopause is when you have not had a menstrual period for 12 months. Having a history of fibroids in your family. You are of African American descent. You started your period at age 55 or younger. You have not given birth. You are overweight or very overweight. What are the signs or symptoms? Bleeding between menstrual periods. Heavy bleeding during your menstrual period. Pain in the area between your hips. Needing to pee (urinate) right away or more often than usual. Not being able to have children (infertility). Not being able to stay pregnant (miscarriage). Many women do not have symptoms.  How is this treated? Treatment may include: Follow-up visits with your doctor to check your fibroids for any changes. Medicines to help with pain, such as aspirin or ibuprofen. Hormone therapy. This may be given as a pill, in a shot, or with a type of birth control device called an IUD. Surgery that would do one of these things: Take out the fibroids. This may be done if you want to become pregnant. Take out the womb (hysterectomy). Stop the blood flow to the fibroids. Follow these instructions at home: Medicines Take over-the-counter and prescription medicines only as told by your doctor. Ask your doctor if you should: Take iron pills. Eat more foods that have a lot of iron in them, such as dark green, leafy vegetables. Managing pain If told, put heat on your back or belly. Do this as often as told by your doctor. Use the heat source that your doctor  recommends, such as a moist heat pack or a heating pad. To do this: Put a towel between your skin and the heat pack or pad. Leave the heat on for 20-30 minutes. Take off the heat if your skin turns bright red. This is very important. If you cannot feel pain, heat, or cold, you may have a greater risk of getting burned.  General instructions Tell your doctor about any changes to your menstrual period, such as: Heavy bleeding that needs a change of tampons or pads more than normal. A change in how many days your period lasts. A change in symptoms that come with your period. This might be belly cramps or back pain. Keep all follow-up visits. Contact a doctor if: You have pain that does not get better with medicine or heat. This may include pain or cramps in: The area between your hip bones. Your back. Your belly. You have new bleeding between your periods. You have more bleeding during or between your periods. You feel very tired or weak. You feel dizzy. Get help right away if: You faint. You have pain in the area between your hip bones that gets worse. You have bleeding that soaks a tampon or pad in 30 minutes or less. Summary Uterine fibroids are lumps of tissue (tumors) in your womb. They are not cancerous. Medicines such as aspirin or ibuprofen may be used to help with pain. Contact a doctor if you have pain or cramps that do not get better with medicine. Know the symptoms for when you  should get help right away. This information is not intended to replace advice given to you by your health care provider. Make sure you discuss any questions you have with your health care provider. Document Revised: 01/11/2020 Document Reviewed: 01/11/2020 Elsevier Patient Education  [place]. Vaginal Hysterectomy  A vaginal hysterectomy is a procedure to remove all or part of the uterus through a small incision in the vagina. In this procedure, your health care provider may remove your  entire uterus, including the cervix. The cervix is the opening and bottom part of the uterus and is located between the vagina and the uterus. Sometimes, the ovaries and fallopian tubes are also removed. This surgery may be done to treat problems such as: Noncancerous growths in the uterus (uterine fibroids) that cause symptoms. A condition that causes the lining of the uterus to grow in other areas (endometriosis). Problems with pelvic support. Cancer of the cervix, ovaries, uterus, or tissue that lines the uterus (endometrium). Excessive bleeding in the uterus. When removing your uterus, your health care provider may also remove the ovaries and the fallopian tubes. After this procedure, you will no longer be able to have a baby, and you will no longer have a menstrual period. Tell a health care provider about: Any allergies you have. All medicines you are taking, including vitamins, herbs, eye drops, creams, and over-the-counter medicines. Any problems you or family members have had with anesthetic medicines. Any blood disorders you have. Any surgeries you have had. Any medical conditions you have. Whether you are pregnant or may be pregnant. What are the risks? Generally, this is a safe procedure. However, problems may occur, including: Bleeding. Infection. Blood clots in the legs or lungs. Damage to nearby structures or organs. Pain during sex. Allergic reactions to medicines. What happens before the procedure? Staying hydrated Follow instructions from your health care provider about hydration, which may include: Up to 2 hours before the procedure - you may continue to drink clear liquids, such as water, clear fruit juice, black coffee, and plain tea.  Eating and drinking restrictions Follow instructions from your health care provider about eating and drinking, which may include: 8 hours before the procedure - stop eating heavy meals or foods, such as meat, fried foods, or fatty  foods. 6 hours before the procedure - stop eating light meals or foods, such as toast or cereal. 6 hours before the procedure - stop drinking milk or drinks that contain milk. 2 hours before the procedure - stop drinking clear liquids. Medicines Take over-the-counter and prescription medicines only as told by your health care provider. You may be asked to take a medicine to empty your colon (bowel preparation). General instructions If you were asked to do a bowel preparation before the procedure, follow instructions from your health care provider. This procedure can affect the way you feel about yourself. Talk with your health care provider about the physical and emotional changes hysterectomy may cause. Do not use any products that contain nicotine or tobacco for at least 4 weeks before the procedure. These products include cigarettes, e-cigarettes, and chewing tobacco. If you need help quitting, ask your health care provider. Plan to have a responsible adult take you home from the hospital or clinic. Plan to have a responsible adult care for you for the time you are told after you leave the hospital or clinic. This is important. Surgery safety Ask your health care provider: How your surgery site will be marked. What steps will be  taken to help prevent infection. These may include: Removing hair at the surgery site. Washing skin with a germ-killing soap. Receiving antibiotic medicine. What happens during the procedure? An IV will be inserted into one of your veins. You will be given one or more of the following: A medicine to help you relax (sedative). A medicine to numb the area (local anesthetic). A medicine to make you fall asleep (general anesthetic). A medicine that is injected into your spine to numb the area below and slightly above the injection site (spinal anesthetic). A medicine that is injected into an area of your body to numb everything below the injection site (regional  anesthetic). Your surgeon will make an incision in your vagina. Your surgeon will locate and remove all or part of your uterus. Part or all of the uterus will be removed through the vagina. Your ovaries and fallopian tubes may be removed at the same time. The incision in your vagina will be closed with stitches (sutures) that dissolve over time. The procedure may vary among health care providers and hospitals. What happens after the procedure? Your blood pressure, heart rate, breathing rate, and blood oxygen level will be monitored until you leave the hospital or clinic. You will be encouraged to walk as soon as possible. You will also use a device or do breathing exercises to keep your lungs clear. You may have to wear compression stockings. These stockings help to prevent blood clots and reduce swelling in your legs. You will be given pain medicine as needed. You will need to wear a sanitary pad for vaginal discharge or bleeding. Summary A vaginal hysterectomy is a procedure to remove all or part of the uterus through the vagina. You may need a vaginal hysterectomy to treat a variety of abnormalities of the uterus. Plan to have a responsible adult take you home from the hospital or clinic. Plan to have a responsible adult care for you for the time you are told after you leave the hospital or clinic. This is important. This information is not intended to replace advice given to you by your health care provider. Make sure you discuss any questions you have with your health care provider. Document Revised: 08/22/2021 Document Reviewed: 02/11/2020 Elsevier Patient Education  [place]. Intrauterine Device Information An intrauterine device (IUD) is a medical device that is inserted into the uterus to prevent pregnancy. It is a small, T-shaped device that has one or two nylon strings hanging down from it. The strings hang out of the lower part of the uterus (cervix) to allow for future IUD  removal. There are two types of IUDs: Hormone IUD. This type of IUD is made of plastic and contains the hormone progestin (synthetic progesterone). A hormone IUD may last 3-5 years. Copper IUD. This type of IUD has copper wire wrapped around it. A copper IUD may last up to 10 years. How is an IUD inserted? An IUD is inserted through the vagina, through the cervix, and into the uterus with a minor medical procedure. The procedure for IUD insertion may vary among health care providers and hospitals. How does an IUD work? Synthetic progesterone in a hormonal IUD prevents pregnancy by: Thickening cervical mucus to prevent sperm from entering the uterus. Thinning the uterine lining to prevent a fertilized egg from being implanted there. Copper in a copper IUD prevents pregnancy by making the uterus and fallopian tubes produce a fluid that kills sperm. What are the advantages of an IUD? Advantages of  either type of IUD An IUD: Is highly effective in preventing pregnancy. Is reversible. You can become pregnant shortly after the IUD is removed. Is low-maintenance and can stay in place for a long time. Has no estrogen-related side effects. Can be used when breastfeeding. Is not associated with weight gain. Can be inserted right after childbirth, an abortion, or a miscarriage. Advantages of a hormone IUD If it is inserted within 7 days of your period starting, it works right after it has been inserted. If the hormone IUD is inserted at any other time in your cycle, you will need to use a backup method of birth control for 7 days after insertion. It can make menstrual periods lighter or stop completely. It can reduce menstrual cramping and other discomforts from menstrual periods. It can be used for 3-5 years, depending on which IUD you have. Advantages of a copper IUD It works right after it is inserted. It can be used as a form of emergency birth control if it is inserted within 5 days after having  unprotected sex. It does not interfere with your body's natural hormones. It can be used for up to 10 years. What are the disadvantages of an IUD? An IUD may cause irregular menstrual bleeding for a period of time after insertion. It is common to have pain during insertion and have cramping and vaginal bleeding after insertion. An IUD may cut the uterus (uterine perforation) when it is inserted. This is rare. Pelvic inflammatory disease (PID) may happen after insertion of an IUD. PID is an infection in the uterus and fallopian tubes. The IUD does not cause the infection. The infection is usually from an unknown sexually transmitted infection (STI). This is rare, and it usually happens during the first 20 days after the IUD is inserted. A copper IUD can make your menstrual flow heavier and more painful. IUDs cannot prevent sexually transmitted infections (STIs). How is an IUD removed?  You will lie on your back with your knees bent and your feet in footrests (stirrups). A device will be inserted into your vagina to spread apart the vaginal walls (speculum). This will allow your health care provider to see the strings attached to the IUD. Your health care provider will use a small instrument (forceps) to grasp the IUD strings and will pull firmly until the IUD is removed. You may have some discomfort when the IUD is removed. Your health care provider may recommend taking over-the-counter pain relievers, such as ibuprofen, before the procedure. You may also have minor spotting for a few days after the procedure. The procedure for IUD removal may vary among health care providers and hospitals. Is an IUD right for me? If you are interested in an IUD, discuss it with your health care provider. He or she will make sure you are a good candidate for an IUD and will let you know more about the advantages, disadvantage, and possible side effects. This will allow you to make a decision about the  device. Summary An intrauterine device (IUD) is a medical device that is inserted in the uterus to prevent pregnancy. It is a small, T-shaped device that has one or two nylon strings hanging down from it. A hormone IUD contains the hormone progestin (synthetic progesterone). A copper IUD has copper wire wrapped around it. Synthetic progesterone in a hormone IUD prevents pregnancy by thickening cervical mucus and thinning the walls of the uterus. Copper in a copper IUD prevents pregnancy by making the uterus and  fallopian tubes produce a fluid that kills sperm. A hormone IUD can be left in place for 3-5 years. A copper IUD can be left in place for up to 10 years. An IUD is inserted and removed by a health care provider. You may feel some pain during insertion and removal. Your health care provider may recommend taking over-the-counter pain medicine, such as ibuprofen, before an IUD procedure. This information is not intended to replace advice given to you by your health care provider. Make sure you discuss any questions you have with your health care provider. Document Revised: 12/22/2019 Document Reviewed: 12/22/2019 Elsevier Patient Education  [place].

## 2022-02-01 NOTE — Progress Notes (Signed)
Pt presents to establish care. Pt seen in ED 01/25/22. Pelvic US done. Hemoglobin 6.9 at that time.   Pt c/o heavy cycles. No bleeding today.

## 2022-02-08 ENCOUNTER — Ambulatory Visit
Admission: RE | Admit: 2022-02-08 | Discharge: 2022-02-08 | Disposition: A | Discharge status: Home / Self Care | Payer: Medicaid Other | Source: Ambulatory Visit | Attending: Family Medicine | Admitting: Family Medicine

## 2022-02-08 DIAGNOSIS — Z1231 Encounter for screening mammogram for malignant neoplasm of breast: Secondary | ICD-10-CM

## 2022-02-11 ENCOUNTER — Other Ambulatory Visit: Payer: Self-pay | Admitting: Family Medicine

## 2022-02-11 DIAGNOSIS — R928 Other abnormal and inconclusive findings on diagnostic imaging of breast: Secondary | ICD-10-CM

## 2022-02-12 ENCOUNTER — Other Ambulatory Visit: Payer: Self-pay | Admitting: Family Medicine

## 2022-02-15 ENCOUNTER — Ambulatory Visit: Payer: Medicaid Other | Admitting: Family Medicine

## 2022-02-15 NOTE — Progress Notes (Deleted)
    SUBJECTIVE:   CHIEF COMPLAINT / HPI: Follow up for palpitations, anemia, menorrhagia  Palpitations   Anemia ED visit after last office visit due to Hgb 6.7. Received 1u pRBC and started on Lysteda. Taking iron supplement***  Menorrhagia Evaluated by OBGYN who gave further info about IDA and possible vaginal hysterectomy vs IUD placement. Pt decided***   PERTINENT  PMH / PSH: ***  OBJECTIVE:   LMP 01/25/2022   ***  ASSESSMENT/PLAN:   No problem-specific Assessment & Plan notes found for this encounter.     [name], MD [place]

## 2022-02-18 ENCOUNTER — Other Ambulatory Visit: Payer: Self-pay | Admitting: Family Medicine

## 2022-02-21 ENCOUNTER — Ambulatory Visit: Payer: Medicaid Other

## 2022-02-21 ENCOUNTER — Ambulatory Visit
Admission: RE | Admit: 2022-02-21 | Discharge: 2022-02-21 | Disposition: A | Discharge status: Home / Self Care | Payer: Medicaid Other | Source: Ambulatory Visit | Attending: Family Medicine | Admitting: Family Medicine

## 2022-02-21 DIAGNOSIS — N6489 Other specified disorders of breast: Secondary | ICD-10-CM | POA: Diagnosis not present

## 2022-02-21 DIAGNOSIS — R928 Other abnormal and inconclusive findings on diagnostic imaging of breast: Secondary | ICD-10-CM

## 2022-03-07 ENCOUNTER — Other Ambulatory Visit: Payer: Self-pay | Admitting: Family Medicine

## 2022-03-07 MED ORDER — FLUTICASONE PROPIONATE 50 MCG/ACT NA SUSP: 2.0000 | Freq: Every day | NASAL | 0 refills | Status: DC

## 2022-03-07 MED ORDER — PANTOPRAZOLE SODIUM 40 MG PO TBEC: 40.0000 mg | DELAYED_RELEASE_TABLET | Freq: Every day | ORAL | 1 refills | Status: DC

## 2022-03-08 ENCOUNTER — Other Ambulatory Visit: Payer: Self-pay | Admitting: Family Medicine

## 2022-03-21 ENCOUNTER — Encounter: Payer: Self-pay | Admitting: Family Medicine

## 2022-03-21 ENCOUNTER — Other Ambulatory Visit: Payer: Self-pay

## 2022-03-21 ENCOUNTER — Ambulatory Visit (INDEPENDENT_AMBULATORY_CARE_PROVIDER_SITE_OTHER): Payer: Medicaid Other | Admitting: Family Medicine

## 2022-03-21 VITALS — BP 125/71 | HR 80 | Wt 210.8 lb

## 2022-03-21 DIAGNOSIS — R002 Palpitations: Secondary | ICD-10-CM

## 2022-03-21 DIAGNOSIS — R0989 Other specified symptoms and signs involving the circulatory and respiratory systems: Secondary | ICD-10-CM

## 2022-03-21 DIAGNOSIS — D500 Iron deficiency anemia secondary to blood loss (chronic): Secondary | ICD-10-CM

## 2022-03-21 LAB — POCT HEMOGLOBIN: Hemoglobin: 10.5 g/dL — AB (ref 11–14.6)

## 2022-03-21 NOTE — Patient Instructions (Signed)
It was great seeing you today!  Im sorry you are still experiencing palpitations and throat clearing.  I referred you to cardiology for the Zio patch for the cardiac monitoring, and ENT to take look at your throat.  They should call you in the next couple of weeks to schedule an appointment, but if you do not hear back let us know and we will check on that referral.  Feel free to call with any questions or concerns at any time, at [phone].   Take care,  Dr. Shary Key [facility]

## 2022-03-21 NOTE — Progress Notes (Unsigned)
    SUBJECTIVE:   CHIEF COMPLAINT / HPI:   Was seen in the clinic on 8/4 for her ongoing palpitations.  States this has been present for years, but resolved after her transfusion a few years ago, and just coming back around May.  She had been under the impression her anemia as a cause of these palpitations.  Sometimes she wakes up out of her sleep because of it.  At last visit EKG showed first-degree AV block which was deemed unlikely to be causing her symptoms.  TSH was normal.  Per last note: "Ms. Rodrigues is here today to discuss her palpitations. Given her history of heavy and painful periods, as well as anemia requiring transfusion, I suspect that her anemia has worsened and she is in a high-output cardiac state that is driving her recurrent palpitations. Her EKG showed a 1st degree AV block that is unlikely to be causing her symptoms. There was no evidence of an irregular rhythm such as atrial fibrillation or flutter, although it is possible she is having episodes of paroxysmal SVT or premature contractions. Will defer cardiac monitoring for now, given her likely cause of anemia, but would like to see her back in 2-3 weeks to further investigate and potentially have her set up with a Zio monitor if still ongoing. Will also check ferritin level given hx of IDA as well as TSH given family hx of thyroid issues."    Still having palpitations, felt heart was getting tight. Feeling SOB like she has to catch her breath. Feels like she is going to pass out. Has been dealing with this since 2016. After blood transfusion didn't have problems, started again in May   Has iron defieicney anemia on oral iron   Has had throat clearing that occurs all day every day. Has been using Flonase since February.   PERTINENT  PMH / PSH: ***  OBJECTIVE:   BP 125/71   Pulse 80   Wt 210 lb 12.8 oz (95.6 kg)   SpO2 100%   BMI 36.18 kg/m   Physical exam General: well appearing, NAD Cardiovascular: RRR, no  murmurs Lungs: CTAB. Normal WOB Abdomen: soft, non-distended, non-tender Skin: warm, dry. No edema  ASSESSMENT/PLAN:   No problem-specific Assessment & Plan notes found for this encounter.   Was seen in the clinic on 8/4 for her ongoing palpitations.  States this has been present for years, but resolved after her transfusion a few years ago, and just coming back around May.  She had been under the impression her anemia as a cause of these palpitations.  Sometimes she wakes up out of her sleep because of it.  At last visit EKG showed first-degree AV block which was deemed unlikely to be causing her symptoms.  TSH was normal.  [place]

## 2022-03-22 ENCOUNTER — Other Ambulatory Visit: Payer: Self-pay | Admitting: Family Medicine

## 2022-03-22 MED ORDER — HYDROXYZINE HCL 10 MG PO TABS: 10.0000 mg | ORAL_TABLET | Freq: Three times a day (TID) | ORAL | 0 refills | Status: DC | PRN

## 2022-03-22 NOTE — Progress Notes (Signed)
Patient called requesting Ativan for anxiety, has tried several SSRIs in the past and did not tolerate because they made her feel weird and sluggish.  Discussed Ativan not being recommended because of the side effects.  We will try hydroxyzine as needed for her anxiety.

## 2022-03-23 DIAGNOSIS — R0989 Other specified symptoms and signs involving the circulatory and respiratory systems: Secondary | ICD-10-CM | POA: Insufficient documentation

## 2022-03-23 NOTE — Assessment & Plan Note (Signed)
Ongoing for the past year. States it feels like post nasal drip though denies congestion. Has trialed allergy medication, has been on Flonase since February without improvement. Patient would like referral to specialist. ENT referral placed.

## 2022-03-23 NOTE — Assessment & Plan Note (Signed)
Work up for palpitations thus far has been unremarkable and hemoglobin stable today at 10.5 but still endorses daily palpitations that cause her to feel SOB. EKG at last visit with 1st degree AV block. TSH was normal. Anxiety could possibly be causing this and patient later called back asking for Ativan because she did not tolerate SSRIs. Sent in Hydroxyzine but would benefit from a daily medication like Buspar. Will discuss at follow up. Referral to Cardiology placed for Zio patch. Return precautions discussed.

## 2022-04-17 ENCOUNTER — Ambulatory Visit (INDEPENDENT_AMBULATORY_CARE_PROVIDER_SITE_OTHER): Payer: Medicaid Other | Admitting: Obstetrics and Gynecology

## 2022-04-17 ENCOUNTER — Encounter: Payer: Self-pay | Admitting: Obstetrics and Gynecology

## 2022-04-17 VITALS — BP 104/68 | HR 76 | Wt 210.0 lb

## 2022-04-17 DIAGNOSIS — D259 Leiomyoma of uterus, unspecified: Secondary | ICD-10-CM | POA: Diagnosis not present

## 2022-04-17 DIAGNOSIS — N938 Other specified abnormal uterine and vaginal bleeding: Secondary | ICD-10-CM | POA: Diagnosis not present

## 2022-04-17 NOTE — Progress Notes (Signed)
Breanna Miranda presents for f/u of Tx options for [facility] and fibroids. See prior office notes.  PE AF VSS Lungs clear Heart RRR Abd soft + BS  A/P [facility] and uterine fibroids  Tx options reviewed again with pt. Pt desires IUD placement Offered today. Pt declined, desires to schedule Information on IUD and insertion provided F/U with IUD insertion

## 2022-04-17 NOTE — Patient Instructions (Signed)
Intrauterine Device Insertion An intrauterine device (IUD) is a medical device that is inserted into the uterus to prevent pregnancy. It is a small, T-shaped device that has one or two nylon strings hanging down from it. The strings hang out of the lower part of the uterus (cervix) to allow for future IUD removal. There are two types of IUDs: Hormone IUD. This type of IUD is made of plastic and contains the hormone progestin (synthetic progesterone). A hormone IUD may last 3-5 years, depending on which one you have. Synthetic progesterone prevents pregnancy by: Thickening cervical mucus to prevent sperm from entering the uterus. Thinning the uterine lining to prevent a fertilized egg from implanting there. Copper IUD. This type of IUD has copper wire wrapped around it. A copper IUD may last up to 10 years. Copper prevents pregnancy by making the uterus and fallopian tubes produce a fluid that kills sperm. Tell a health care provider about: Any allergies you have. All medicines you are taking, including vitamins, herbs, eye drops, creams, and over-the-counter medicines. Any surgeries you have had. Any medical conditions you have, including any sexually transmitted infections (STIs) you may have. Whether you are pregnant or may be pregnant. What are the risks? Generally, this is a safe procedure. However, problems may occur, including: Infection. Bleeding. Allergic reactions to medicines. Puncture (perforation) of the uterus or damage to other structures or organs. Accidental placement of the IUD either in the muscle layer of the uterus (myometrium) or outside the uterus. The IUD falling out of the uterus (expulsion). This is more common among women who have recently had a child. Higher risk of an egg being fertilized outside your uterus (ectopic pregnancy).This is rare. Pelvic inflammatory disease (PID), which is an infection in the uterus and fallopian tubes. The IUD does not cause the  infection. The infection is usually from an unknown sexually transmitted infection (STI). This is rare, and it usually happens during the first 20 days after the IUD is inserted. What happens before the procedure? Ask your health care provider about: Changing or stopping your regular medicines. This is especially important if you are taking diabetes medicines or blood thinners. Taking over-the-counter medicines, vitamins, herbs, and supplements. Talk with your health care provider about when to schedule your IUD placement. Your health care provider may recommend taking over-the-counter pain medicines before the procedure. These medicines include ibuprofen and naproxen. You may have tests for: Pregnancy. A pregnancy test involves having a urine or blood sample taken. Sexually transmitted infections (STIs). Placing an IUD in someone who has an STI can make the infection worse. Cervical cancer. You may have a Pap test to check for this type of cancer. This means collecting cells from your cervix to be checked under a microscope. You may have a physical exam to determine the size and position of your uterus. What happens during the procedure? A tool (speculum) will be placed in your vagina and widened so that your health care provider can see your cervix. Medicine, or antiseptic, may be applied to your cervix to help lower your risk of infection. You may be given an anesthetic medicine to numb each side of your cervix. This medicine is usually given by an injection into the cervix. A tool called a uterine sound will be inserted into your uterus to check the length of your uterus and the direction that your uterus may be tilted. A slim instrument or tube (IUD inserter) that holds the IUD will be inserted into your vagina,  through your cervical canal, and into your uterus. The IUD will be placed in the uterus, and the IUD inserter will be removed. The strings that are attached to the IUD will be trimmed  so that they lie just below the cervix. The speculum will be removed. The procedure may vary among health care providers and hospitals. What can I expect after procedure? You may have bleeding after the procedure. This is normal. It varies from light bleeding (spotting) for a few days to menstrual-like bleeding. You may have cramping and pain in the abdomen. You may feel dizzy or light-headed. You may have lower back pain. You may have headaches and nausea. Follow these instructions at home: Before resuming sexual activity, check to make sure that you can feel the IUD string or strings. You should be able to feel the end of the string below the opening of your cervix. If your IUD string is in place, you may resume sexual activity. If you had a hormonal IUD inserted more than 7 days after your most recent period started, you will need to use a backup method of birth control for 7 days after IUD insertion. Ask your health care provider whether this applies to you. Continue to check that the IUD is still in place by feeling for the strings after every menstrual period, or once a month. An IUD will not protect you from sexually transmitted infections (STIs). Use methods to prevent the exchange of body fluids between partners (barrier protection) every time you have sex. Barrier protection can be used during oral, vaginal, or anal sex. Commonly used barrier methods include: Female condom. Female condom. Dental dam. Take over-the-counter and prescription medicines only as told by your health care provider. Keep all follow-up visits. This is important. Contact a health care provider if: You feel light-headed or weak. You have any of the following problems with your IUD string or strings: The string bothers or hurts you or your sexual partner. You cannot feel the string. The string has gotten longer. You can feel the IUD in your vagina. You think you may be pregnant, or you miss your menstrual  period. You think you may have a sexually transmitted infection (STI). Get help right away if you: You have flu-like symptoms, such as tiredness (fatigue) and muscle aches. You have a fever and chills. You have bleeding that is heavier or lasts longer than a normal menstrual cycle. You have abnormal or bad-smelling discharge from your vagina. You develop abdominal pain that is new, is getting worse, or is not in the same area of earlier cramping and pain. You have pain during sexual activity. Summary An intrauterine device (IUD) is a small, T-shaped device that has one or two nylon strings hanging down from it. You may have a copper IUD or a hormone IUD. Ask your health care provider what you need to do before the procedure. You may have some tests and you may have to change or stop some medicines. You may have bleeding after the procedure. This is normal. It varies from light spotting for a few days to menstrual-like bleeding. Check to make sure that you can feel the IUD strings before you resume sexual activity. Check the strings after every menstrual period or once a month. An IUD does not protect against STIs. Use other methods to protect yourself against infections. This information is not intended to replace advice given to you by your health care provider. Make sure you discuss any questions you have with  your health care provider. Document Revised: 12/22/2019 Document Reviewed: 12/22/2019 Elsevier Patient Education  [place]. IUD PLACEMENT POST-PROCEDURE INSTRUCTIONS  You may take Ibuprofen, Aleve or Tylenol for pain if needed.  Cramping should resolve within in 24 hours.  You may have a small amount of spotting.  You should wear a mini pad for the next few days.  You may have intercourse after 24 hours.  If you using this for birth control, it is effective immediately.  You need to call if you have any pelvic pain, fever, heavy bleeding or foul smelling vaginal discharge.   Irregular bleeding is common the first several months after having an IUD placed. You do not need to call for this reason unless you are concerned.  Shower or bathe as normal  You should have a follow-up appointment in 4-8 weeks for a re-check to make sure you are not having any problems.

## 2022-04-30 ENCOUNTER — Ambulatory Visit: Payer: Medicaid Other | Attending: Interventional Cardiology | Admitting: Interventional Cardiology

## 2022-05-21 ENCOUNTER — Ambulatory Visit: Payer: Medicaid Other | Admitting: Obstetrics and Gynecology

## 2022-05-30 ENCOUNTER — Other Ambulatory Visit: Payer: Self-pay | Admitting: Family Medicine

## 2022-05-31 ENCOUNTER — Ambulatory Visit: Payer: Medicaid Other

## 2022-05-31 ENCOUNTER — Ambulatory Visit: Payer: Medicaid Other | Attending: Interventional Cardiology | Admitting: Interventional Cardiology

## 2022-05-31 ENCOUNTER — Ambulatory Visit: Payer: Medicaid Other | Attending: Interventional Cardiology

## 2022-05-31 VITALS — BP 104/62 | HR 85 | Ht 64.0 in | Wt 209.0 lb

## 2022-05-31 DIAGNOSIS — R079 Chest pain, unspecified: Secondary | ICD-10-CM

## 2022-05-31 DIAGNOSIS — E669 Obesity, unspecified: Secondary | ICD-10-CM

## 2022-05-31 DIAGNOSIS — R002 Palpitations: Secondary | ICD-10-CM

## 2022-05-31 NOTE — Progress Notes (Signed)
Cardiology Office Note   Date:  05/31/2022   ID:  Thetis, Schwimmer Oct 26, 1985, MRN [id number]  PCP:  Shary Key, DO    No chief complaint on file.  Palpitations  Wt Readings from Last 3 Encounters:  05/31/22 209 lb (94.8 kg)  04/17/22 210 lb (95.3 kg)  03/21/22 210 lb 12.8 oz (95.6 kg)       History of Present Illness: Breanna Miranda is a 36 y.o. female who is being seen today for the evaluation of palpitations at the request of Lind Covert, *.   2016, she had a negative event monitor for fluttering.   A few nights ago, she had a several hours of episodes of a fast HR, 180 bpm.  She has had episodes of more brief palpitations.    She has had some blood transfusions, and IV iron.    Heavy bleeding from menstrual cycles.    Has some chest soreness after the episodes.    Denies : exertional Chest pain. Dizziness. Leg edema. Nitroglycerin use. Orthopnea. Palpitations. Paroxysmal nocturnal dyspnea. Shortness of breath. Syncope.    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     diclofenac Sodium (VOLTAREN) 1 % GEL Apply topically.     ferrous sulfate 325 (65 FE) MG tablet Take 325 mg by mouth daily with breakfast.     fluticasone (FLONASE) 50 MCG/ACT nasal spray SHAKE LIQUID AND USE 2 SPRAYS IN EACH NOSTRIL DAILY 48 g 3   Lactobacillus (PROBIOTIC ACIDOPHILUS PO) Take 1  tablet by mouth daily.     Multiple Vitamin (MULTIVITAMIN) capsule Take 1 capsule by mouth daily.     pantoprazole (PROTONIX) 40 MG tablet TAKE 1 TABLET(40 MG) BY MOUTH DAILY 90 tablet 0   zinc gluconate 50 MG tablet Take 50 mg by mouth daily.     Current Facility-Administered Medications  Medication Dose Route Frequency Provider Last Rate Last Admin   Acetaminophen CAPS 500 mg  500 mg Oral Once Kinnie Feil, MD        Allergies:   Penicillins, Shrimp [shellfish allergy], and Shrimp (diagnostic)    Social History:  The patient  reports that she quit smoking about 15 years ago. Her smoking use included cigarettes. She has a 0.20 pack-year smoking history. She has never used smokeless tobacco. She reports current alcohol use of about 1.0 standard drink of alcohol per week. She reports that she does not use drugs.   Family History:  The patient's family history includes Diabetes in her father; Heart attack (age of onset: 75) in her maternal grandmother; Hypertension in her mother; Kidney disease in her father.    ROS:  Please see the history of present illness.   Otherwise, review of systems are positive for palpitations, chest soreness.  All other systems are reviewed and negative.    PHYSICAL EXAM: VS:  BP 104/62   Pulse 85   Ht '5\' 4"'$  (1.626 m)   Wt 209 lb (94.8 kg)   SpO2 97%   BMI 35.87 kg/m  , BMI Body mass index is 35.87 kg/m. GEN: Well nourished, well developed, in no acute distress HEENT: normal Neck: no JVD, carotid bruits, or masses Cardiac: RRR; no murmurs, rubs, or gallops,no edema  Respiratory:  clear to auscultation bilaterally, normal work of breathing GI: soft, nontender, nondistended, + BS MS: no deformity or atrophy Skin: warm and dry, no rash Neuro:  Strength and sensation are intact Psych: euthymic mood, full affect   EKG:   The ekg ordered today demonstrates NSR, no ST changes   Recent Labs: 06/19/2021: ALT 12 01/24/2022: TSH 1.490 01/25/2022: BUN  5; Creatinine, Ser 0.79; Platelets 329; Potassium 3.4; Sodium 136 03/21/2022: Hemoglobin 10.5   Lipid Panel    Component Value Date/Time   CHOL 185 05/21/2021 1655   TRIG 340 (H) 05/21/2021 1655   HDL 61 05/21/2021 1655   CHOLHDL 3.0 05/21/2021 1655   CHOLHDL 2.5 08/16/2016 0452   VLDL 16 08/16/2016 0452   LDLCALC 71 05/21/2021 1655     Other studies Reviewed: Additional studies/ records that were reviewed today with results demonstrating: labs reviewed.   ASSESSMENT AND PLAN:  Palpitations: Need to rule out SVT.  She has had profound anemia.  She has required both blood transfusions as well as iron infusions IV.  Certainly severe anemia could trigger tachycardia. Chest soreness: Atypical for ischemia.  Will check echocardiogram to evaluate for any structural heart disease. Obesity: Healthy diet.  Regular exercise when hemoglobin is controlled.   Current medicines are reviewed at length with the patient today.  The patient concerns regarding her medicines were addressed.  The following changes have been made:  No change  Labs/ tests ordered today include:  No orders of the defined types were placed in this encounter.   Recommend 150 minutes/week of aerobic exercise Low fat, low carb, high fiber diet recommended  Disposition:   FU based on test result   Signed, Larae Grooms, MD  05/31/2022 [facility] [place], [place], [place]  [address] Phone: [phone]; Fax: [phone]

## 2022-05-31 NOTE — Progress Notes (Unsigned)
Enrolled patient for a 14 day Zio XT  monitor to be mailed to patients home  °

## 2022-05-31 NOTE — Patient Instructions (Signed)
Medication Instructions:  Your physician recommends that you continue on your current medications as directed. Please refer to the Current Medication list given to you today.  *If you need a refill on your cardiac medications before your next appointment, please call your pharmacy*  Lab Work: If you have labs (blood work) drawn today and your tests are completely normal, you will receive your results only by: [place] (if you have MyChart) OR A paper copy in the mail If you have any lab test that is abnormal or we need to change your treatment, we will call you to review the results.  Testing/Procedures: Your physician has recommended that you wear a 14 day monitor. Event monitors are medical devices that record the heart's electrical activity. Doctors most often [place] these monitors to diagnose arrhythmias. Arrhythmias are problems with the speed or rhythm of the heartbeat. The monitor is a small, portable device. You can wear one while you do your normal daily activities. This is usually used to diagnose what is causing palpitations/syncope (passing out).  Your physician has requested that you have an echocardiogram. Echocardiography is a painless test that uses sound waves to create images of your heart. It provides your doctor with information about the size and shape of your heart and how well your heart's chambers and valves are working. This procedure takes approximately one hour. There are no restrictions for this procedure. Please do NOT wear cologne, perfume, aftershave, or lotions (deodorant is allowed). Please arrive 15 minutes prior to your appointment time.  Follow-Up: At [facility], you and your health needs are our priority.  As part of our continuing mission to provide you with exceptional heart care, we have created designated Provider Care Teams.  These Care Teams include your primary Cardiologist (physician) and Advanced Practice Providers (APPs -  Physician  Assistants and Nurse Practitioners) who all work together to provide you with the care you need, when you need it.  We recommend signing up for the patient portal called "MyChart".  Sign up information is provided on this After Visit Summary.  MyChart is used to connect with patients for Virtual Visits (Telemedicine).  Patients are able to view lab/test results, encounter notes, upcoming appointments, etc.  Non-urgent messages can be sent to your provider as well.   To learn more about what you can do with MyChart, go to [email or web address].    Your next appointment:   As needed  The format for your next appointment:   In Person  Provider:   Larae Grooms, MD     Important Information About Sugar

## 2022-06-03 ENCOUNTER — Ambulatory Visit (INDEPENDENT_AMBULATORY_CARE_PROVIDER_SITE_OTHER): Payer: Medicaid Other | Admitting: Family Medicine

## 2022-06-03 ENCOUNTER — Encounter: Payer: Self-pay | Admitting: Family Medicine

## 2022-06-03 VITALS — BP 122/80 | HR 88 | Temp 98.8°F | Ht 64.0 in | Wt 211.6 lb

## 2022-06-03 DIAGNOSIS — D500 Iron deficiency anemia secondary to blood loss (chronic): Secondary | ICD-10-CM | POA: Diagnosis not present

## 2022-06-03 NOTE — Progress Notes (Signed)
    SUBJECTIVE:   CHIEF COMPLAINT / HPI:  MS is a 36yo F w/ of heavy menstrual bleeding and IDA that presents with feelings of palpitation. She reports that this is how she normally feels when her Hgb is low so she came in to get her hgb checked. Reports she is still have heavy menstrual bleeding, seeing OBGYN for management.   PERTINENT  PMH / PSH: as above  OBJECTIVE:   BP 122/80   Pulse 88   Temp 98.8 F (37.1 C)   Ht '5\' 4"'$  (1.626 m)   Wt 211 lb 9.6 oz (96 kg)   SpO2 100%   BMI 36.32 kg/m   Gen: Pleasant, woman sitting in chair. NAD. Psych: Alert and grossly oriented HEENT: NCAT. MMM Resp: Normal WOB on RA  ASSESSMENT/PLAN:   Anemia Has hx of IDA 2/2 heavy menstrual bleeding. Reports recent sensation of palpation, which is how she felt previously when her hgb dropped so she wanted her hgb checked today. Has reuqired blood and iron transfusion in past. - f/u CBC. Consider IV iron or blood transfusion if indicated.  - Cont oral iron supplement   Arlyce Dice, MD [place]

## 2022-06-03 NOTE — Addendum Note (Signed)
Addended by: Janan Halter F on: 06/03/2022 02:28 PM   Modules accepted: Orders

## 2022-06-04 LAB — CBC
Hematocrit: 28.9 % — ABNORMAL LOW (ref 34.0–46.6)
Hemoglobin: 8.6 g/dL — ABNORMAL LOW (ref 11.1–15.9)
MCH: 20.5 pg — ABNORMAL LOW (ref 26.6–33.0)
MCHC: 29.8 g/dL — ABNORMAL LOW (ref 31.5–35.7)
MCV: 69 fL — ABNORMAL LOW (ref 79–97)
Platelets: 357 10*3/uL (ref 150–450)
RBC: 4.2 x10E6/uL (ref 3.77–5.28)
RDW: 16.9 % — ABNORMAL HIGH (ref 11.7–15.4)
WBC: 5.6 10*3/uL (ref 3.4–10.8)

## 2022-06-06 NOTE — Assessment & Plan Note (Signed)
Has hx of IDA 2/2 heavy menstrual bleeding. Reports recent sensation of palpation, which is how she felt previously when her hgb dropped so she wanted her hgb checked today. Has reuqired blood and iron transfusion in past. - f/u CBC. Consider IV iron or blood transfusion if indicated.  - Cont oral iron supplement

## 2022-06-09 DIAGNOSIS — R002 Palpitations: Secondary | ICD-10-CM

## 2022-06-09 DIAGNOSIS — R079 Chest pain, unspecified: Secondary | ICD-10-CM | POA: Diagnosis not present

## 2022-06-17 ENCOUNTER — Emergency Department (HOSPITAL_COMMUNITY)
Admission: EM | Admit: 2022-06-17 | Discharge: 2022-06-17 | Disposition: A | Discharge status: Home / Self Care | Payer: Medicaid Other | Attending: Emergency Medicine | Admitting: Emergency Medicine

## 2022-06-17 DIAGNOSIS — J45909 Unspecified asthma, uncomplicated: Secondary | ICD-10-CM | POA: Diagnosis not present

## 2022-06-17 DIAGNOSIS — R7309 Other abnormal glucose: Secondary | ICD-10-CM | POA: Diagnosis not present

## 2022-06-17 DIAGNOSIS — Z87891 Personal history of nicotine dependence: Secondary | ICD-10-CM | POA: Insufficient documentation

## 2022-06-17 DIAGNOSIS — D649 Anemia, unspecified: Secondary | ICD-10-CM | POA: Diagnosis not present

## 2022-06-17 DIAGNOSIS — N939 Abnormal uterine and vaginal bleeding, unspecified: Secondary | ICD-10-CM | POA: Diagnosis not present

## 2022-06-17 LAB — CBC WITH DIFFERENTIAL/PLATELET
Abs Immature Granulocytes: 0.02 10*3/uL (ref 0.00–0.07)
Basophils Absolute: 0 10*3/uL (ref 0.0–0.1)
Basophils Relative: 0 %
Eosinophils Absolute: 0.1 10*3/uL (ref 0.0–0.5)
Eosinophils Relative: 1 %
HCT: 25.6 % — ABNORMAL LOW (ref 36.0–46.0)
Hemoglobin: 7.4 g/dL — ABNORMAL LOW (ref 12.0–15.0)
Immature Granulocytes: 0 %
Lymphocytes Relative: 21 %
Lymphs Abs: 1.2 10*3/uL (ref 0.7–4.0)
MCH: 19.3 pg — ABNORMAL LOW (ref 26.0–34.0)
MCHC: 28.9 g/dL — ABNORMAL LOW (ref 30.0–36.0)
MCV: 66.8 fL — ABNORMAL LOW (ref 80.0–100.0)
Monocytes Absolute: 0.6 10*3/uL (ref 0.1–1.0)
Monocytes Relative: 9 %
Neutro Abs: 4.1 10*3/uL (ref 1.7–7.7)
Neutrophils Relative %: 69 %
Platelets: 306 10*3/uL (ref 150–400)
RBC: 3.83 MIL/uL — ABNORMAL LOW (ref 3.87–5.11)
RDW: 17.2 % — ABNORMAL HIGH (ref 11.5–15.5)
Smear Review: ADEQUATE
WBC: 5.9 10*3/uL (ref 4.0–10.5)
nRBC: 0 % (ref 0.0–0.2)

## 2022-06-17 LAB — CBG MONITORING, ED: Glucose-Capillary: 120 mg/dL — ABNORMAL HIGH (ref 70–99)

## 2022-06-17 LAB — BASIC METABOLIC PANEL
Anion gap: 9 (ref 5–15)
BUN: 10 mg/dL (ref 6–20)
CO2: 21 mmol/L — ABNORMAL LOW (ref 22–32)
Calcium: 8.7 mg/dL — ABNORMAL LOW (ref 8.9–10.3)
Chloride: 106 mmol/L (ref 98–111)
Creatinine, Ser: 0.74 mg/dL (ref 0.44–1.00)
GFR, Estimated: >60 mL/min (ref >60)
Glucose, Bld: 117 mg/dL — ABNORMAL HIGH (ref 70–99)
Potassium: 3.6 mmol/L (ref 3.5–5.1)
Sodium: 136 mmol/L (ref 135–145)

## 2022-06-17 LAB — TYPE AND SCREEN
ABO/RH(D): B POS
Antibody Screen: NEGATIVE

## 2022-06-17 LAB — I-STAT BETA HCG BLOOD, ED (MC, WL, AP ONLY): I-stat hCG, quantitative: <5 m[IU]/mL (ref <5)

## 2022-06-17 LAB — PROTIME-INR
INR: >10 (ref 0.8–1.2)
INR: 1.1 (ref 0.8–1.2)
Prothrombin Time: >90 seconds — ABNORMAL HIGH (ref 11.4–15.2)
Prothrombin Time: 13.8 seconds (ref 11.4–15.2)

## 2022-06-17 MED ORDER — SODIUM CHLORIDE 0.9 % IV SOLN: INTRAVENOUS | Status: DC

## 2022-06-17 MED ORDER — SODIUM CHLORIDE 0.9 % IV BOLUS
1000.0000 mL | Freq: Once | INTRAVENOUS | Status: AC
  Administered 2022-06-17: 1000 mL via INTRAVENOUS

## 2022-06-17 NOTE — ED Notes (Signed)
Critical PT & INR communicated to MD

## 2022-06-17 NOTE — ED Provider Notes (Signed)
[facility] EMERGENCY DEPARTMENT Provider Note   CSN: [id number] Arrival date & time: 06/17/22  6063     History  Chief Complaint  Patient presents with   Weakness   Vaginal Bleeding    Breanna Miranda is a 36 y.o. female.  Patient with a history of heavy vaginal bleeding.  Patient followed by family medicine.  Patient is had blood transfusions in the emergency department before last time was August.  When hemoglobin was below 7.  Patient was told 2 weeks ago by family medicine that hemoglobin was low and to come in to have a blood transfusion.  Patient has had lightheadedness.  Patient's had some syncopal episodes.  Patient has follow-up pending with OB/GYN.  And patient is followed by [facility] family medicine.  Past medical history significant for menorrhagia anemia asthma.  Past surgical history significant for tubal ligation and cholecystectomy.  Patient is a former smoker quit in 2008.       Home Medications Prior to Admission medications   Medication Sig Start Date End Date Taking? Authorizing Provider  acetaminophen (TYLENOL) 500 MG tablet Take 1,000 mg by mouth every 6 (six) hours as needed for mild pain.   Yes [provider]  diclofenac Sodium (VOLTAREN) 1 % GEL Apply 1 Application topically 2 (two) times daily as needed (pain). 04/08/22  Yes [provider]  ferrous sulfate 325 (65 FE) MG tablet Take 325 mg by mouth daily with breakfast.   Yes [provider]  fluticasone (FLONASE) 50 MCG/ACT nasal spray SHAKE LIQUID AND USE 2 SPRAYS IN EACH NOSTRIL DAILY Patient taking differently: Place 2 sprays into both nostrils daily. 03/08/22  Yes Paige, Weldon Picking, DO  Multiple Vitamin (MULTIVITAMIN) tablet Take 1 tablet by mouth in the morning.   Yes [provider]  Multiple Vitamins-Minerals (ZINC PO) Take 1 tablet by mouth in the morning.   Yes [provider]  pantoprazole (PROTONIX) 40 MG tablet TAKE 1 TABLET(40  MG) BY MOUTH DAILY Patient taking differently: Take 40 mg by mouth in the morning. 05/31/22  Yes Paige, Weldon Picking, DO  Probiotic Product (PROBIOTIC PO) Take 1 capsule by mouth in the morning.   Yes [provider]  montelukast (SINGULAIR) 10 MG tablet Take 1 tablet (10 mg total) by mouth at bedtime. 05/12/18 08/02/19  Kinnie Feil, MD      Allergies    Penicillins and Shrimp [shellfish allergy]    Review of Systems   Review of Systems  Constitutional:  Negative for chills and fever.  HENT:  Negative for ear pain and sore throat.   Eyes:  Negative for pain and visual disturbance.  Respiratory:  Negative for cough and shortness of breath.   Cardiovascular:  Negative for chest pain and palpitations.  Gastrointestinal:  Negative for abdominal pain and vomiting.  Genitourinary:  Positive for vaginal bleeding. Negative for dysuria and hematuria.  Musculoskeletal:  Negative for arthralgias and back pain.  Skin:  Negative for color change and rash.  Neurological:  Positive for light-headedness. Negative for seizures and syncope.  All other systems reviewed and are negative.   Physical Exam Updated Vital Signs BP 122/74 (BP Location: Right Arm)   Pulse 70   Temp 97.7 F (36.5 C) (Oral)   Resp 14   Ht 1.626 m ('5\' 4"'$ )   Wt 93.9 kg   LMP 06/10/2022   SpO2 100%   BMI 35.53 kg/m  Physical Exam Vitals and nursing note reviewed.  Constitutional:  General: She is not in acute distress.    Appearance: Normal appearance. She is well-developed.  HENT:     Head: Normocephalic and atraumatic.     Mouth/Throat:     Mouth: Mucous membranes are moist.  Eyes:     Extraocular Movements: Extraocular movements intact.     Conjunctiva/sclera: Conjunctivae normal.     Pupils: Pupils are equal, round, and reactive to light.  Cardiovascular:     Rate and Rhythm: Normal rate and regular rhythm.     Heart sounds: No murmur heard. Pulmonary:     Effort: Pulmonary effort is normal.  No respiratory distress.     Breath sounds: Normal breath sounds.  Abdominal:     Palpations: Abdomen is soft.     Tenderness: There is no abdominal tenderness.  Musculoskeletal:        General: No swelling. Normal range of motion.     Cervical back: Normal range of motion and neck supple.  Skin:    General: Skin is warm and dry.     Capillary Refill: Capillary refill takes less than 2 seconds.  Neurological:     General: No focal deficit present.     Mental Status: She is alert and oriented to person, place, and time.     Cranial Nerves: No cranial nerve deficit.     Sensory: No sensory deficit.     Motor: No weakness.  Psychiatric:        Mood and Affect: Mood normal.     ED Results / Procedures / Treatments   Labs (all labs ordered are listed, but only abnormal results are displayed) Labs Reviewed  BASIC METABOLIC PANEL - Abnormal; Notable for the following components:      Result Value   CO2 21 (*)    Glucose, Bld 117 (*)    Calcium 8.7 (*)    All other components within normal limits  CBC WITH DIFFERENTIAL/PLATELET - Abnormal; Notable for the following components:   RBC 3.83 (*)    Hemoglobin 7.4 (*)    HCT 25.6 (*)    MCV 66.8 (*)    MCH 19.3 (*)    MCHC 28.9 (*)    RDW 17.2 (*)    All other components within normal limits  PROTIME-INR - Abnormal; Notable for the following components:   Prothrombin Time >90.0 (*)    INR >10.0 (*)    All other components within normal limits  CBG MONITORING, ED - Abnormal; Notable for the following components:   Glucose-Capillary 120 (*)    All other components within normal limits  PROTIME-INR  I-STAT BETA HCG BLOOD, ED ([facility], [facility], [facility] ONLY)  TYPE AND SCREEN    EKG EKG Interpretation  Date/Time:  Monday June 17 2022 08:06:54 EST Ventricular Rate:  70 PR Interval:  199 QRS Duration: 85 QT Interval:  403 QTC Calculation: 435 R Axis:   56 Text Interpretation: Sinus rhythm Confirmed by Fredia Sorrow [phone]) on  06/17/2022 8:14:48 AM  Radiology No results found.  Procedures Procedures    Medications Ordered in ED Medications  0.9 %  sodium chloride infusion (has no administration in time range)  sodium chloride 0.9 % bolus 1,000 mL (0 mLs Intravenous Stopped 06/17/22 1026)    ED Course/ Medical Decision Making/ A&P                           Medical Decision Making Amount and/or Complexity of Data Reviewed Labs: ordered.  Risk Prescription drug management.  Patient with known history of heavy bleeding.  Patient had an elevated INR that we felt was erroneous.  It was repeated and INR is now normal.  Patient's basic metabolic panel here is normal.  CBC hemoglobin 7.4 white blood cell count 5.9 platelets 306.  Patient not meeting criteria for emergent blood transfusion in the emergency department.  Patient's vital signs very normal.  Patient did receive a liter of IV fluids ambulated to the bathroom and back and ambulated in hallways and she was doing fine.  Patient was okay with discharge home.  Nursing reported that fianc raise concerns about discharge.  Will talk to them again.  Final Clinical Impression(s) / ED Diagnoses Final diagnoses:  Abnormal vaginal bleeding  Anemia, unspecified type    Rx / DC Orders ED Discharge Orders     None         Fredia Sorrow, MD 06/17/22 1113

## 2022-06-17 NOTE — Discharge Instructions (Signed)
Follow back up with [facility].  And follow-up with OB/GYN.  Today's hemoglobin was above 7 so not meeting criteria for transfusion.  Turn for any new or worse symptoms.

## 2022-06-17 NOTE — ED Triage Notes (Signed)
Pt. States Im going to pass out cause Im so weak. I have a monitor on my heart and it came off 2 days ago.

## 2022-06-17 NOTE — ED Triage Notes (Addendum)
Pt. Stated, I've been bleeding a lot during my cycle and it makes me weak, every time I have my cycle I get weak and feel like Im going to pass out. Pt. Stated. My blood was down to 6 from a 12 and Im suppose to see my OB Dr. And have not yet.

## 2022-06-20 ENCOUNTER — Ambulatory Visit (INDEPENDENT_AMBULATORY_CARE_PROVIDER_SITE_OTHER): Payer: Medicaid Other | Admitting: Family Medicine

## 2022-06-20 VITALS — BP 104/62 | HR 72 | Wt 211.6 lb

## 2022-06-20 DIAGNOSIS — D500 Iron deficiency anemia secondary to blood loss (chronic): Secondary | ICD-10-CM

## 2022-06-20 NOTE — Progress Notes (Signed)
    SUBJECTIVE:   CHIEF COMPLAINT / HPI:   Patient with history of abnormal uterine bleeding and iron deficiency anemia presents for follow up after being instructed to get evaluated by the ED. LMP 05/31/2022. Only gets heavy periods when she has her cycle, her cycles are regular. Goes through about 10 pads a day when she has her period. She stopped taking her iron supplements 2-3 weeks ago and just started back earlier this week, taking them daily. Feels good taking them. Denies melena or hematochezia. She feels weak and tired at times which occurs when she has on her cycle from the blood loss. Was previously referred to gynecology, she last saw them at the beginning of this month and has another follow up in 2 weeks. Her gynecologist has given her a treatment which she is unsure of.    OBJECTIVE:   BP 104/62   Pulse 72   Wt 211 lb 9.6 oz (96 kg)   LMP 05/31/2022   SpO2 98%   BMI 36.32 kg/m   General: Patient well-appearing, in no acute distress.  CV: RRR, no murmurs or gallops auscultated Resp: CTAB, no wheezing, rales or rhonchi noted  ASSESSMENT/PLAN:   Anemia -anemia secondary to iron deficiency anemia and menorrhagia which is chronic and stable, patient has been chronically symptomatic. Reassuringly her she is not currently having a bleeding source.  -Hgb 7.4 a few days ago, above transfusion threshold of 7 and no current bleeding so no indication for IV iron infusion or blood transfusion at this time  -discussed importance of daily iron supplementation and not skipping doses -follow up with gynecology, next visit upcoming in 2 weeks -pending CBC to trend Hgb  -ED precautions discussed regarding further symptomatic anemia when blood transfusion may be indicated      Donney Dice, [place]

## 2022-06-20 NOTE — Patient Instructions (Signed)
It was great seeing you today!  Today we discussed your symptoms which are from anemia. Please make sure to take take iron supplementation daily. Please also follow up with your gynecologist. We checked your hemoglobin today and will let you know of the results.   If you experience dizziness, shortness of breath or pass out then please go to the emergency department.  Please follow up at your next scheduled appointment, if anything arises between now and then, please don't hesitate to contact our office.   Thank you for allowing [place] to be a part of your medical care!  Thank you, Dr. Larae Grooms  Also a reminder of our clinic's no-show policy. Please make sure to arrive at least 15 minutes prior to your scheduled appointment time. Please try to cancel before 24 hours if you are not able to make it. If you no-show for 2 appointments then you will be receiving a warning letter. If you no-show after 3 visits, then you may be at risk of being dismissed from our clinic. This is to ensure that everyone is able to be seen in a timely manner. Thank you, we appreciate your assistance with this!

## 2022-06-20 NOTE — Assessment & Plan Note (Addendum)
-  anemia secondary to iron deficiency anemia and menorrhagia which is chronic and stable, patient has been chronically symptomatic. Reassuringly her she is not currently having a bleeding source.  -Hgb 7.4 a few days ago, above transfusion threshold of 7 and no current bleeding so no indication for IV iron infusion or blood transfusion at this time  -discussed importance of daily iron supplementation and not skipping doses -follow up with gynecology, next visit upcoming in 2 weeks -pending CBC to trend Hgb  -ED precautions discussed regarding further symptomatic anemia when blood transfusion may be indicated

## 2022-06-21 ENCOUNTER — Encounter (HOSPITAL_COMMUNITY): Payer: Self-pay

## 2022-06-21 ENCOUNTER — Encounter (HOSPITAL_BASED_OUTPATIENT_CLINIC_OR_DEPARTMENT_OTHER): Payer: Self-pay | Admitting: Emergency Medicine

## 2022-06-21 ENCOUNTER — Other Ambulatory Visit: Payer: Self-pay

## 2022-06-21 ENCOUNTER — Emergency Department (HOSPITAL_BASED_OUTPATIENT_CLINIC_OR_DEPARTMENT_OTHER)
Admission: EM | Admit: 2022-06-21 | Discharge: 2022-06-22 | Disposition: A | Discharge status: Home / Self Care | Payer: Medicaid Other | Attending: Emergency Medicine | Admitting: Emergency Medicine

## 2022-06-21 DIAGNOSIS — R42 Dizziness and giddiness: Secondary | ICD-10-CM | POA: Diagnosis present

## 2022-06-21 DIAGNOSIS — D649 Anemia, unspecified: Secondary | ICD-10-CM | POA: Diagnosis not present

## 2022-06-21 DIAGNOSIS — E876 Hypokalemia: Secondary | ICD-10-CM | POA: Diagnosis not present

## 2022-06-21 LAB — URINALYSIS, ROUTINE W REFLEX MICROSCOPIC
Bilirubin Urine: NEGATIVE
Glucose, UA: NEGATIVE mg/dL
Hgb urine dipstick: NEGATIVE
Ketones, ur: NEGATIVE mg/dL
Leukocytes,Ua: NEGATIVE
Nitrite: NEGATIVE
Protein, ur: NEGATIVE mg/dL
Specific Gravity, Urine: 1.021 (ref 1.005–1.030)
pH: 6.5 (ref 5.0–8.0)

## 2022-06-21 LAB — PREPARE RBC (CROSSMATCH)

## 2022-06-21 LAB — BASIC METABOLIC PANEL
Anion gap: 11 (ref 5–15)
BUN: 9 mg/dL (ref 6–20)
CO2: 25 mmol/L (ref 22–32)
Calcium: 8.7 mg/dL — ABNORMAL LOW (ref 8.9–10.3)
Chloride: 102 mmol/L (ref 98–111)
Creatinine, Ser: 0.73 mg/dL (ref 0.44–1.00)
GFR, Estimated: >60 mL/min (ref >60)
Glucose, Bld: 121 mg/dL — ABNORMAL HIGH (ref 70–99)
Potassium: 2.9 mmol/L — ABNORMAL LOW (ref 3.5–5.1)
Sodium: 138 mmol/L (ref 135–145)

## 2022-06-21 LAB — CBC
HCT: 26.2 % — ABNORMAL LOW (ref 36.0–46.0)
Hematocrit: 25.3 % — ABNORMAL LOW (ref 34.0–46.6)
Hemoglobin: 7.1 g/dL — ABNORMAL LOW (ref 11.1–15.9)
Hemoglobin: 7.6 g/dL — ABNORMAL LOW (ref 12.0–15.0)
MCH: 18.8 pg — ABNORMAL LOW (ref 26.6–33.0)
MCH: 19 pg — ABNORMAL LOW (ref 26.0–34.0)
MCHC: 28.1 g/dL — ABNORMAL LOW (ref 31.5–35.7)
MCHC: 29 g/dL — ABNORMAL LOW (ref 30.0–36.0)
MCV: 67 fL — ABNORMAL LOW (ref 79–97)
MCV: 65.7 fL — ABNORMAL LOW (ref 80.0–100.0)
Platelets: 340 10*3/uL (ref 150–450)
Platelets: 275 10*3/uL (ref 150–400)
RBC: 3.78 x10E6/uL (ref 3.77–5.28)
RBC: 3.99 MIL/uL (ref 3.87–5.11)
RDW: 16.5 % — ABNORMAL HIGH (ref 11.7–15.4)
RDW: 17.5 % — ABNORMAL HIGH (ref 11.5–15.5)
WBC: 6.1 10*3/uL (ref 3.4–10.8)
WBC: 6.8 10*3/uL (ref 4.0–10.5)
nRBC: 0 % (ref 0.0–0.2)

## 2022-06-21 LAB — PREGNANCY, URINE: Preg Test, Ur: NEGATIVE

## 2022-06-21 MED ORDER — POTASSIUM CHLORIDE 10 MEQ/100ML IV SOLN
10.0000 meq | INTRAVENOUS | Status: AC
  Administered 2022-06-21 – 2022-06-22 (×3): 10 meq via INTRAVENOUS
  Filled 2022-06-21 (×3): qty 100

## 2022-06-21 MED ORDER — POTASSIUM CHLORIDE CRYS ER 20 MEQ PO TBCR
40.0000 meq | EXTENDED_RELEASE_TABLET | Freq: Once | ORAL | Status: AC
  Administered 2022-06-21: 40 meq via ORAL
  Filled 2022-06-21: qty 2

## 2022-06-21 MED ORDER — SODIUM CHLORIDE 0.9% IV SOLUTION: Freq: Once | INTRAVENOUS | Status: AC

## 2022-06-21 NOTE — ED Provider Notes (Signed)
  Physical Exam  BP 120/82   Pulse 73   Temp 98.3 F (36.8 C)   Resp 15   Ht '5\' 4"'$  (1.626 m)   Wt 93.9 kg   LMP 05/31/2022   SpO2 99%   BMI 35.53 kg/m   Physical Exam  Procedures  Procedures  ED Course / MDM    Medical Decision Making Care assumed at 10:30 PM upon patient's transfer.  Patient was seen in [place] for symptomatic anemia.  She has heavy menses that stopped already.  She went to family practice yesterday her hemoglobin was 7.1.  She felt lightheaded and dizzy and went to [facility] and her hemoglobin was 7.6.  Her potassium was 2.9.  Hospitalist was initially called regarding admission but since patient is hemodynamically stable, the decision was to transfuse 1 unit of blood for symptomatic anemia and patient can be discharged and has GYN follow-up with women's health.  I was able to order 1 unit of PRBC and also 3 rounds of potassium.  Patient confirmed that she has no active bleeding and remains hemodynamically stable.  11:43 PM Signed out to Dr. Christy Gentles in the ED. Patient can be discharged after potassium supplementation and 1 U PRBC. Patient can follow up with family practice and Women's health.   Amount and/or Complexity of Data Reviewed Labs: ordered.  Risk Prescription drug management.          Drenda Freeze, MD 06/21/22 [phone]

## 2022-06-21 NOTE — ED Triage Notes (Signed)
Pt arrives pov, slow gait, self driven to ED, c/o dizziness and tachycardia "for a few weeks". Endorses syncope on 12.25, hx of anemia

## 2022-06-21 NOTE — Discharge Instructions (Addendum)
You have low potassium.  Please eat foods high in potassium.  You need your potassium level rechecked in a week.  You are anemic.  We received blood in the ED.  You need a hemoglobin check in the week with your doctor  Please follow-up at family practice and also women's health.  Return to ER if you have uncontrolled bleeding, dizziness, passing out, chest pain, shortness of breath

## 2022-06-21 NOTE — ED Provider Notes (Signed)
[facility] EMERGENCY DEPT Provider Note   CSN: [id number] Arrival date & time: 06/21/22  1450     History  Chief Complaint  Patient presents with   Dizziness    Breanna Miranda is a 36 y.o. female.  HPI Patient has history of iron deficiency anemia with blood loss from vaginal bleeding.  She reports she typically has a very heavy menstrual cycle.  She reports she is compliant with her iron supplementation.  She has been seen by her PCP who is monitoring her blood counts.  Patient reports that she became much more symptomatic over the past 2 days.  She reports she has had multiple episodes of passing out.  Her partner is here with her and reports that she has had some episodes of complete loss of consciousness.  She reports that she can feel the symptoms coming on and off and is able to lie down first and get her legs elevated.  Her partner however reports that there have been episodes where she has been completely out.  Patient reports that she is not having any active vaginal bleeding at this time.  She last menstrual cycle at the beginning of the month.  No other source of bleeding.  Denies abdominal pain or chest pain.  She notes however that she has been getting episodes of extreme racing heart.  She is also felt short of breath particular with exertion.    Home Medications Prior to Admission medications   Medication Sig Start Date End Date Taking? Authorizing Provider  acetaminophen (TYLENOL) 500 MG tablet Take 1,000 mg by mouth every 6 (six) hours as needed for mild pain.    [provider]  diclofenac Sodium (VOLTAREN) 1 % GEL Apply 1 Application topically 2 (two) times daily as needed (pain). 04/08/22   [provider]  ferrous sulfate 325 (65 FE) MG tablet Take 325 mg by mouth daily with breakfast.    [provider]  fluticasone (FLONASE) 50 MCG/ACT nasal spray SHAKE LIQUID AND USE 2 SPRAYS IN EACH NOSTRIL DAILY Patient taking  differently: Place 2 sprays into both nostrils daily. 03/08/22   Shary Key, DO  Multiple Vitamin (MULTIVITAMIN) tablet Take 1 tablet by mouth in the morning.    [provider]  Multiple Vitamins-Minerals (ZINC PO) Take 1 tablet by mouth in the morning.    [provider]  pantoprazole (PROTONIX) 40 MG tablet TAKE 1 TABLET(40 MG) BY MOUTH DAILY Patient taking differently: Take 40 mg by mouth in the morning. 05/31/22   Shary Key, DO  Probiotic Product (PROBIOTIC PO) Take 1 capsule by mouth in the morning.    [provider]  montelukast (SINGULAIR) 10 MG tablet Take 1 tablet (10 mg total) by mouth at bedtime. 05/12/18 08/02/19  Kinnie Feil, MD      Allergies    Penicillins and Shrimp [shellfish allergy]    Review of Systems   Review of Systems  Physical Exam Updated Vital Signs BP 102/69 (BP Location: Right Arm)   Pulse 80   Temp 98 F (36.7 C) (Oral)   Resp 18   Ht '5\' 4"'$  (1.626 m)   Wt 93.9 kg   LMP 05/31/2022   SpO2 100%   BMI 35.53 kg/m  Physical Exam Constitutional:      Appearance: Normal appearance.  HENT:     Mouth/Throat:     Comments: mucous membranes moist but pale. Eyes:     Comments: Pale  conjunctive  Cardiovascular:  Rate and Rhythm: Normal rate and regular rhythm.  Pulmonary:     Effort: Pulmonary effort is normal.     Breath sounds: Normal breath sounds.  Abdominal:     General: There is no distension.     Palpations: Abdomen is soft.     Tenderness: There is no abdominal tenderness. There is no guarding.  Musculoskeletal:        General: No swelling or tenderness. Normal range of motion.     Cervical back: Neck supple.     Right lower leg: No edema.     Left lower leg: No edema.  Skin:    General: Skin is warm and dry.     Coloration: Skin is pale.  Neurological:     General: No focal deficit present.     Mental Status: She is alert and oriented to person, place, and time.     Motor: No weakness.      Coordination: Coordination normal.     Comments: No focal motor weakness.  Psychiatric:        Mood and Affect: Mood normal.     ED Results / Procedures / Treatments   Labs (all labs ordered are listed, but only abnormal results are displayed) Labs Reviewed  BASIC METABOLIC PANEL - Abnormal; Notable for the following components:      Result Value   Potassium 2.9 (*)    Glucose, Bld 121 (*)    Calcium 8.7 (*)    All other components within normal limits  CBC - Abnormal; Notable for the following components:   Hemoglobin 7.6 (*)    HCT 26.2 (*)    MCV 65.7 (*)    MCH 19.0 (*)    MCHC 29.0 (*)    RDW 17.5 (*)    All other components within normal limits  URINALYSIS, ROUTINE W REFLEX MICROSCOPIC  PREGNANCY, URINE    EKG EKG Interpretation  Date/Time:  Friday June 21 2022 15:06:37 EST Ventricular Rate:  77 PR Interval:  188 QRS Duration: 68 QT Interval:  372 QTC Calculation: 420 R Axis:   61 Text Interpretation: Normal sinus rhythm Normal ECG When compared with ECG of 17-Jun-2022 08:06, PREVIOUS ECG IS PRESENT no change from previous Confirmed by Charlesetta Shanks [phone]) on 06/21/2022 6:13:39 PM  Radiology No results found.  Procedures Procedures    Medications Ordered in ED Medications  potassium chloride SA (KLOR-CON M) CR tablet 40 mEq (40 mEq Oral Given 06/21/22 1907)    ED Course/ Medical Decision Making/ A&P                           Medical Decision Making Amount and/or Complexity of Data Reviewed Labs: ordered.  Risk Prescription drug management.   Patient presents as outlined.  She has had history of anemia typically from heavy menstrual cycles.  Patient describes being more symptomatic at this time.  She describes several syncopal episodes and exertional dyspnea as well as palpitations.  Patient is not having active bleeding currently.  Blood pressures are in the low 779T systolic.  Heart rate is in the mid 70s to 80s.  At this time with  patient having clinical symptoms of anemia, will plan for transfusion.  Potassium returns at 2.9.  With report of palpitations and symptomatic anemia will also replace potassium.  At this time we will go with oral replacement 40 mEq.  Current EKG does not show significant abnormalities reviewed by myself.  Consult: Reviewed  with Dr. Francia Greaves for admission.  We reviewed plan of ED to ED transfer for blood transfusion.  If patient requires additional treatment, plan to reconsult hospitalist service.  At this time I have initiated potassium replacement.  Consult: Dr. Darl Householder for ED to ED transfer to [facility].  There is no availability for blood transfusion at this facility, will transfer for transfusion of a unit of blood in the setting of clinically symptomatic anemia.  Patient also requires potassium replacement given report of episodic significant palpitations which may be combined effect of anemia and hypokalemia.  During her period of observation in the emergency department patient has had normal sinus rhythm without evidence of SVT or dysrhythmia.  Will continue to monitor.  Plan: ED to ED transfer.        Final Clinical Impression(s) / ED Diagnoses Final diagnoses:  Symptomatic anemia  Hypokalemia    Rx / DC Orders ED Discharge Orders     None         Charlesetta Shanks, MD 06/21/22 2017

## 2022-06-22 NOTE — ED Provider Notes (Signed)
I assumed care in signout.  Patient was to receive packed red blood cell transfusion and reassessment.  She feels well.  She has tolerated transfusion.  She would like to be discharged.  No new bleeding episodes.  She will follow-up with GYN   Ripley Fraise, MD 06/22/22 [phone]

## 2022-06-23 LAB — BPAM RBC
Blood Product Expiration Date: 202401232359
ISSUE DATE / TIME: 202312300000
Unit Type and Rh: 7300

## 2022-06-23 LAB — TYPE AND SCREEN
ABO/RH(D): B POS
Antibody Screen: NEGATIVE
Unit division: 0

## 2022-06-28 ENCOUNTER — Ambulatory Visit (HOSPITAL_COMMUNITY): Payer: Medicaid Other | Attending: Interventional Cardiology

## 2022-06-28 DIAGNOSIS — R079 Chest pain, unspecified: Secondary | ICD-10-CM | POA: Diagnosis not present

## 2022-06-28 DIAGNOSIS — R002 Palpitations: Secondary | ICD-10-CM | POA: Diagnosis not present

## 2022-06-28 LAB — ECHOCARDIOGRAM COMPLETE
Area-P 1/2: 4.6 cm2
S' Lateral: 2.5 cm

## 2022-07-08 ENCOUNTER — Ambulatory Visit: Payer: Medicaid Other | Admitting: Student

## 2022-07-15 ENCOUNTER — Encounter: Payer: Self-pay | Admitting: Obstetrics and Gynecology

## 2022-07-15 ENCOUNTER — Ambulatory Visit (INDEPENDENT_AMBULATORY_CARE_PROVIDER_SITE_OTHER): Payer: Medicaid Other | Admitting: Obstetrics and Gynecology

## 2022-07-15 VITALS — BP 114/69 | HR 86 | Wt 210.0 lb

## 2022-07-15 DIAGNOSIS — N920 Excessive and frequent menstruation with regular cycle: Secondary | ICD-10-CM

## 2022-07-15 DIAGNOSIS — D259 Leiomyoma of uterus, unspecified: Secondary | ICD-10-CM | POA: Diagnosis not present

## 2022-07-15 MED ORDER — MEGESTROL ACETATE 40 MG PO TABS: 40.0000 mg | ORAL_TABLET | Freq: Two times a day (BID) | ORAL | 5 refills | Status: DC

## 2022-07-15 NOTE — Progress Notes (Signed)
Ms Reifsteck presents for discussion hysterectomy See prior office notes Pt does not want an IUD know  H/O TSVD x 4 ( 7# 6oz) GYN U/S 570 gms, largest fibroid 6 cm  PE AF VSS Chaperone present Lungs clear Heart RRR Abd soft + BS GU Nl EGBUS, uterus @ 10- 12 weeks, mobile, slightly tender  A/P [facility]        Uterine fibroids        IDA secondary to above, H/O blood transfusions  Pt know desires hysterectomy. TVH with BS reviewed with pt. R/B/Post op care discussed Information provided. Hysterectomy papers signed today. Will start Megace until surgery F/U with post op visit

## 2022-07-15 NOTE — Progress Notes (Signed)
Pt in office to discuss possible hysterectomy.

## 2022-07-15 NOTE — Patient Instructions (Signed)
Vaginal Hysterectomy, Care After The following information offers guidance on how to care for yourself after your procedure. Your health care provider may also give you more specific instructions. If you have problems or questions, contact your health care provider. What can I expect after the procedure? After the procedure, it is common to have: Pain in the lower abdomen and vagina. Vaginal bleeding and discharge for up to 1 week. You will need to use a sanitary pad after this procedure. Difficulty having a bowel movement (constipation). Temporary problems emptying the bladder. Tiredness (fatigue). Poor appetite. Less interest in sex. Feelings of sadness or other emotions. If your ovaries were also removed, it is also common to have symptoms of menopause, such as hot flashes, night sweats, and lack of sleep (insomnia). Follow these instructions at home: Medicines  Take over-the-counter and prescription medicines only as told by your health care provider. Ask your health care provider if the medicine prescribed to you: Requires you to avoid driving or using machinery. Can cause constipation. You may need to take these actions to prevent or treat constipation: Drink enough fluid to keep your urine pale yellow. Take over-the-counter or prescription medicines. Eat foods that are high in fiber, such as beans, whole grains, and fresh fruits and vegetables. Limit foods that are high in fat and processed sugars, such as fried or sweet foods. Activity  Rest as told by your health care provider. Return to your normal activities as told by your health care provider. Ask your health care provider what activities are safe for you Avoid sitting for a long time without moving. Get up to take short walks every 1-2 hours. This is important to improve blood flow and breathing. Ask for help if you feel weak or unsteady. Try to have someone home with you for 1-2 weeks to help you with everyday chores. Do  not lift anything that is heavier than 10 lb (4.5 kg), or the limit that you are told, until your health care provider says that it is safe. If you were given a sedative during the procedure, it can affect you for several hours. Do not drive or operate machinery until your health care provider says that it is safe. Lifestyle Do not use any products that contain nicotine or tobacco. These products include cigarettes, chewing tobacco, and vaping devices, such as e-cigarettes. These can delay healing after surgery. If you need help quitting, ask your health care provider. Do not drink alcohol until your health care provider approves. General instructions Do not douche, use tampons, or have sex for at least 6 weeks, or as told by your health care provider. If you struggle with physical or emotional changes after your procedure, speak with your health care provider or a therapist. The stitches inside your vagina will dissolve over time and do not need to be taken out. Do not take baths, swim, or use a hot tub until your health care provider approves. You may only be allowed to take showers for 2-3 weeks Wear compression stockings as told by your health care provider. These stockings help to prevent blood clots and reduce swelling in your legs. Keep all follow-up visits. This is important. Contact a health care provider if: Your pain medicine is not helping. You have a fever. You have nausea or vomiting that does not go away. You feel dizzy. You have blood, pus, or a bad-smelling discharge from your vagina more than 1 week after the procedure. You continue to have trouble urinating 3-5  days after the procedure. Get help right away if: You have severe pain in your abdomen or back. You faint. You have heavy vaginal bleeding and blood clots, soaking through a sanitary pad in less than 1 hour. You have chest pain or shortness of breath. You have pain, swelling, or redness in your leg. These symptoms  may represent a serious problem that is an emergency. Do not wait to see if the symptoms will go away. Get medical help right away. Call your local emergency services (911 in the [place] Do not drive yourself to the hospital. Summary After the procedure, it is common to have pain, vaginal bleeding, constipation, temporary problems emptying your bladder, and feelings of sadness or other emotions. Take over-the-counter and prescription medicines only as told by your health care provider. Rest as told by your health care provider. Return to your normal activities as told by your health care provider. Contact a health care provider if your pain medicine is not helping, or you have a fever, dizziness, or trouble urinating several days after the procedure. Get help right away if you have severe pain in your abdomen or back, or if you faint, have heavy bleeding, or have chest pain or shortness of breath. This information is not intended to replace advice given to you by your health care provider. Make sure you discuss any questions you have with your health care provider. Document Revised: 08/22/2021 Document Reviewed: 02/11/2020 Elsevier Patient Education  [place]. Vaginal Hysterectomy  A vaginal hysterectomy is a procedure to remove all or part of the uterus through a small incision in the vagina. In this procedure, your health care provider may remove your entire uterus, including the cervix. The cervix is the opening and bottom part of the uterus and is located between the vagina and the uterus. Sometimes, the ovaries and fallopian tubes are also removed. This surgery may be done to treat problems such as: Noncancerous growths in the uterus (uterine fibroids) that cause symptoms. A condition that causes the lining of the uterus to grow in other areas (endometriosis). Problems with pelvic support. Cancer of the cervix, ovaries, uterus, or tissue that lines the uterus (endometrium). Excessive  bleeding in the uterus. When removing your uterus, your health care provider may also remove the ovaries and the fallopian tubes. After this procedure, you will no longer be able to have a baby, and you will no longer have a menstrual period. Tell a health care provider about: Any allergies you have. All medicines you are taking, including vitamins, herbs, eye drops, creams, and over-the-counter medicines. Any problems you or family members have had with anesthetic medicines. Any blood disorders you have. Any surgeries you have had. Any medical conditions you have. Whether you are pregnant or may be pregnant. What are the risks? Generally, this is a safe procedure. However, problems may occur, including: Bleeding. Infection. Blood clots in the legs or lungs. Damage to nearby structures or organs. Pain during sex. Allergic reactions to medicines. What happens before the procedure? Staying hydrated Follow instructions from your health care provider about hydration, which may include: Up to 2 hours before the procedure - you may continue to drink clear liquids, such as water, clear fruit juice, black coffee, and plain tea.  Eating and drinking restrictions Follow instructions from your health care provider about eating and drinking, which may include: 8 hours before the procedure - stop eating heavy meals or foods, such as meat, fried foods, or fatty foods. 6 hours  before the procedure - stop eating light meals or foods, such as toast or cereal. 6 hours before the procedure - stop drinking milk or drinks that contain milk. 2 hours before the procedure - stop drinking clear liquids. Medicines Take over-the-counter and prescription medicines only as told by your health care provider. You may be asked to take a medicine to empty your colon (bowel preparation). General instructions If you were asked to do a bowel preparation before the procedure, follow instructions from your health care  provider. This procedure can affect the way you feel about yourself. Talk with your health care provider about the physical and emotional changes hysterectomy may cause. Do not use any products that contain nicotine or tobacco for at least 4 weeks before the procedure. These products include cigarettes, e-cigarettes, and chewing tobacco. If you need help quitting, ask your health care provider. Plan to have a responsible adult take you home from the hospital or clinic. Plan to have a responsible adult care for you for the time you are told after you leave the hospital or clinic. This is important. Surgery safety Ask your health care provider: How your surgery site will be marked. What steps will be taken to help prevent infection. These may include: Removing hair at the surgery site. Washing skin with a germ-killing soap. Receiving antibiotic medicine. What happens during the procedure? An IV will be inserted into one of your veins. You will be given one or more of the following: A medicine to help you relax (sedative). A medicine to numb the area (local anesthetic). A medicine to make you fall asleep (general anesthetic). A medicine that is injected into your spine to numb the area below and slightly above the injection site (spinal anesthetic). A medicine that is injected into an area of your body to numb everything below the injection site (regional anesthetic). Your surgeon will make an incision in your vagina. Your surgeon will locate and remove all or part of your uterus. Part or all of the uterus will be removed through the vagina. Your ovaries and fallopian tubes may be removed at the same time. The incision in your vagina will be closed with stitches (sutures) that dissolve over time. The procedure may vary among health care providers and hospitals. What happens after the procedure? Your blood pressure, heart rate, breathing rate, and blood oxygen level will be monitored until you  leave the hospital or clinic. You will be encouraged to walk as soon as possible. You will also use a device or do breathing exercises to keep your lungs clear. You may have to wear compression stockings. These stockings help to prevent blood clots and reduce swelling in your legs. You will be given pain medicine as needed. You will need to wear a sanitary pad for vaginal discharge or bleeding. Summary A vaginal hysterectomy is a procedure to remove all or part of the uterus through the vagina. You may need a vaginal hysterectomy to treat a variety of abnormalities of the uterus. Plan to have a responsible adult take you home from the hospital or clinic. Plan to have a responsible adult care for you for the time you are told after you leave the hospital or clinic. This is important. This information is not intended to replace advice given to you by your health care provider. Make sure you discuss any questions you have with your health care provider. Document Revised: 08/22/2021 Document Reviewed: 02/11/2020 Elsevier Patient Education  [place].

## 2022-08-02 ENCOUNTER — Encounter: Payer: Self-pay | Admitting: Student

## 2022-08-02 ENCOUNTER — Telehealth: Payer: Self-pay

## 2022-08-02 ENCOUNTER — Ambulatory Visit (INDEPENDENT_AMBULATORY_CARE_PROVIDER_SITE_OTHER): Payer: Medicaid Other | Admitting: Student

## 2022-08-02 VITALS — BP 106/74 | HR 87 | Wt 213.6 lb

## 2022-08-02 DIAGNOSIS — R202 Paresthesia of skin: Secondary | ICD-10-CM | POA: Diagnosis not present

## 2022-08-02 NOTE — Patient Instructions (Addendum)
It was wonderful to meet you today. Thank you for allowing me to be a part of your care. Below is a short summary of what we discussed at your visit today:  I have obtained labs to check for your thyroid level, blood cell count, B12, folate, electrolytes.  For nerve conduction studies has been placed as well.  I placed referral to see a neurologist.  They will call you to schedule an appointment.    Please bring all of your medications to every appointment!  If you have any questions or concerns, please do not hesitate to contact us via phone or MyChart message.   Alen Bleacher, MD [facility]

## 2022-08-02 NOTE — Telephone Encounter (Signed)
Patient called and left a message regarding side effects to recent prescription.  Returned patient call. No answer. Left vm for patient to return call to the office

## 2022-08-02 NOTE — Progress Notes (Addendum)
    SUBJECTIVE:   CHIEF COMPLAINT / HPI:   Patient is a 37 year old female presenting today for tingling and numbness in the hands.  Has been going on for the past 4 days.. No traumas, works as Engineer, civil (consulting) which involves passing med, charting and some times lifting. Patient has difficulty describing pain but says it is numbness and tingling that goes from the shoulder bilaterally to the hands. Usually in the morning but now consistent through out the day. It is constant and she has  tried Voltaren gel with no relieve. In addition patient reports also feeling cold and requiring use of multiple blanket . Per chart review patient has previously been seen for possible carpal tunnel that responded well to carpal tunnel brace in the past.   PERTINENT  PMH / PSH: Reviewed   OBJECTIVE:   BP 106/74   Pulse 87   Wt 213 lb 9.6 oz (96.9 kg)   SpO2 98%   BMI 36.66 kg/m      Physical Exam  General: Alert, well appearing, NAD] Neuro: Pupil equal and reactive to light, no gross neurological deficit  Bilateral Shoulder Exam No other gross deformity, ecchymoses. Point tenderness noted along the trapezius on both sides and deltoid area FROM with normal strength. Normal Abduction/Adduction, Internal & External rotation Negative Hawkins, Empty can test.  Numbness in both hands  ASSESSMENT/PLAN:   Bilateral Upper Extremity Paresthesia Unclear cause of pain and presentation. Broad differentials which include fibromyalgia given tender points on exam but an unusual presentation. Other differential includes thyroid disease, autoimmune diseases, syphilis, inflammatory etiology. Her exam is not consistent with tendinopathy or rotator cuff injury. Will order labs to further evaluate patient -Obtained lab for TSH, RPR, BMP, CBC, Vit B12 -Ordered Nerve conduction study -Placed referral to neurology    Jerre Simon, MD [facility]

## 2022-08-02 NOTE — Telephone Encounter (Signed)
Patient called stating that she just recently started taking Megace on 07/15/22. Patient states that she has started experiencing numbness and tingling since in bilateral arms and hands. Patients states that she has never had this problem until she started taking this medication.  Discussed sx with Dr. Jodi Mourning. He states that this medication should not cause these symptoms. Patient advised to be evaluated at her primary care or urgent care for sx.

## 2022-08-03 ENCOUNTER — Encounter: Payer: Self-pay | Admitting: Student

## 2022-08-04 LAB — CBC WITH DIFFERENTIAL/PLATELET
Basophils Absolute: 0 10*3/uL (ref 0.0–0.2)
Basos: 1 %
EOS (ABSOLUTE): 0.1 10*3/uL (ref 0.0–0.4)
Eos: 2 %
Hematocrit: 32.5 % — ABNORMAL LOW (ref 34.0–46.6)
Hemoglobin: 9.4 g/dL — ABNORMAL LOW (ref 11.1–15.9)
Immature Grans (Abs): 0 10*3/uL (ref 0.0–0.1)
Immature Granulocytes: 0 %
Lymphocytes Absolute: 2.6 10*3/uL (ref 0.7–3.1)
Lymphs: 44 %
MCH: 20 pg — ABNORMAL LOW (ref 26.6–33.0)
MCHC: 28.9 g/dL — ABNORMAL LOW (ref 31.5–35.7)
MCV: 69 fL — ABNORMAL LOW (ref 79–97)
Monocytes Absolute: 0.5 10*3/uL (ref 0.1–0.9)
Monocytes: 8 %
Neutrophils Absolute: 2.7 10*3/uL (ref 1.4–7.0)
Neutrophils: 45 %
Platelets: 365 10*3/uL (ref 150–450)
RBC: 4.69 x10E6/uL (ref 3.77–5.28)
RDW: 20.7 % — ABNORMAL HIGH (ref 11.7–15.4)
WBC: 5.9 10*3/uL (ref 3.4–10.8)

## 2022-08-04 LAB — BASIC METABOLIC PANEL
BUN/Creatinine Ratio: 12 (ref 9–23)
BUN: 10 mg/dL (ref 6–20)
CO2: 20 mmol/L (ref 20–29)
Calcium: 8.8 mg/dL (ref 8.7–10.2)
Chloride: 105 mmol/L (ref 96–106)
Creatinine, Ser: 0.84 mg/dL (ref 0.57–1.00)
Glucose: 103 mg/dL — ABNORMAL HIGH (ref 70–99)
Potassium: 4.2 mmol/L (ref 3.5–5.2)
Sodium: 139 mmol/L (ref 134–144)
eGFR: 91 mL/min/{1.73_m2} (ref >59)

## 2022-08-04 LAB — VITAMIN B12: Vitamin B-12: 409 pg/mL (ref 232–1245)

## 2022-08-04 LAB — TSH: TSH: 1.62 u[IU]/mL (ref 0.450–4.500)

## 2022-08-04 LAB — RPR: RPR Ser Ql: NONREACTIVE

## 2022-08-05 ENCOUNTER — Encounter: Payer: Self-pay | Admitting: Student

## 2022-08-08 ENCOUNTER — Ambulatory Visit (INDEPENDENT_AMBULATORY_CARE_PROVIDER_SITE_OTHER): Payer: Medicaid Other | Admitting: Family Medicine

## 2022-08-08 VITALS — BP 107/80 | HR 76 | Ht 64.0 in | Wt 211.0 lb

## 2022-08-08 DIAGNOSIS — M5410 Radiculopathy, site unspecified: Secondary | ICD-10-CM

## 2022-08-08 MED ORDER — PREGABALIN 25 MG PO CAPS: 25.0000 mg | ORAL_CAPSULE | Freq: Two times a day (BID) | ORAL | 1 refills | Status: DC

## 2022-08-08 NOTE — Patient Instructions (Addendum)
It was wonderful to see you today.  Please bring ALL of your medications with you to every visit.   Today we talked about:  I am starting you on a medication called Pregabalin for nerve pain. You can take one tablet twice a day. We do have room to go up in dose if you are tolerating this well.  I have ordered an x-ray of your cervical spine. You do not need an appointment for this and can go to the location below at your earliest convenience.   [facility]: [place]  [phone]   Thank you for coming to your visit as scheduled. We have had a large "no-show" problem lately, and this significantly limits our ability to see and care for patients. As a friendly reminder- if you cannot make your appointment please call to cancel. We do have a no show policy for those who do not cancel within 24 hours. Our policy is that if you miss or fail to cancel an appointment within 24 hours, 3 times in a 47-monthperiod, you may be dismissed from our clinic.   Thank you for choosing [facility]   Please call [phone] any questions about today's appointment.  Please be sure to schedule follow up at the front  desk before you leave today.   ASharion Settler DO PGY-3 Family Medicine

## 2022-08-08 NOTE — Progress Notes (Signed)
    SUBJECTIVE:   CHIEF COMPLAINT / HPI:   Breanna Miranda is a 37 y.o. female who presents to the [facility] clinic today to discuss the following concerns:   Paraesthesias upper extremities Last seen by Dr. Adah Salvage on 2/9 for similar symptoms. Lab work ordered at that visit reviewed: normal TSH, B12, negative RPR, BMP unremarkable, and CBC with known microcytic anemia (from menorrhagia, scheduled for hysterectomy in March) but Hgb improved from previous check. Nerve conduction study ordered at that time and she also received a referral to Neurology.  She presents today for follow up. She continues to have significant paresthesias in both arms radiating down to her hands and all fingers.  She has tried Voltaren gel, Tylenol, ibuprofen without relief.  She reports in the past she has been tried on gabapentin but this did not help.  She is also tried Cymbalta in the past but did not like the way it made her feel.  She has never tried Lyrica.  She denies any history of trauma, neck pain.  She was unaware of any previous carpal tunnel history. Her work involves a lot of typing and she finds that she has to take frequent breaks due to the discomfort.  She feels like her arms are heavy and sometimes get weak.  She also notices that her veins seem more prominent.   PERTINENT  PMH / PSH: N/A  OBJECTIVE:   BP 107/80   Pulse 76   Ht '5\' 4"'$  (1.626 m)   Wt 211 lb (95.7 kg)   LMP 06/26/2022   SpO2 100%   BMI 36.22 kg/m    General: NAD, pleasant, able to participate in exam Neck: Supple, no c-spine ttp, negative spurling test b/l Cardiac: 2+ radial pulses b/l  Respiratory: normal effort MSK: +Roos test b/l, negative Adsons test b/l . Normal strength in all rotator cuff muscles in b/l arms, neurovascularly intact Skin: warm and dry, no rashes noted Neuro: alert, normal sensation b/l arms, hands Psych: Normal affect and mood  ASSESSMENT/PLAN:   1. Radicular pain of upper extremity Unclear  etiology but suspect cervical radiculopathy. Also question if may have thoracic outlet syndrome given positive Roos test, but she did have negative Adsons test. She would benefit from further imaging, will start with x-ray.  Suspect that she may need further imaging with MRI if this is unrevealing.  She has not had great success with gabapentin or Cymbalta in the past, will trial Lyrica.  She requested to start a very low-dose.  She is able to tolerate the medications without significant adverse effects we discussed increasing dosage for symptomatic relief. - DG Cervical Spine 2 or 3 views; Future    [name], [place]

## 2022-08-10 ENCOUNTER — Encounter: Payer: Self-pay | Admitting: Student

## 2022-08-13 ENCOUNTER — Other Ambulatory Visit (HOSPITAL_COMMUNITY)
Admission: RE | Admit: 2022-08-13 | Discharge: 2022-08-13 | Disposition: A | Discharge status: Home / Self Care | Payer: Medicaid Other | Source: Ambulatory Visit | Attending: Family Medicine | Admitting: Family Medicine

## 2022-08-13 ENCOUNTER — Ambulatory Visit (INDEPENDENT_AMBULATORY_CARE_PROVIDER_SITE_OTHER): Payer: Medicaid Other | Admitting: Family Medicine

## 2022-08-13 VITALS — BP 113/74 | HR 72 | Ht 64.0 in | Wt 215.0 lb

## 2022-08-13 DIAGNOSIS — Z124 Encounter for screening for malignant neoplasm of cervix: Secondary | ICD-10-CM | POA: Insufficient documentation

## 2022-08-13 DIAGNOSIS — N898 Other specified noninflammatory disorders of vagina: Secondary | ICD-10-CM | POA: Insufficient documentation

## 2022-08-13 NOTE — Assessment & Plan Note (Signed)
-  GC/Chlamydia, BV, trichomonas and candida testing pending -reiterated importance of maintaining good hygiene and ways to prevent BV and yeast infections -RPR 2/9 nonreactive, patient politely declined HIV and RPR testing today. I think this is appropriate as she is up to date on both tests and not involved with any new partners -follow up as appropriate

## 2022-08-13 NOTE — Progress Notes (Signed)
    SUBJECTIVE:   CHIEF COMPLAINT / HPI:   Patient presents with vaginal odor for the past week. Denies any new partners. Denies vaginal discharge, dysuria, vaginal irritation or itching, fever, chills, abdominal or pelvic pain.  Started taking steroids prescribed by OB, she is getting a hysterectomy but otherwise no new medications. She is not worried about STIs in particular, just wanted to make sure the odor is not an infection. Denies any other concerns at this time.   OBJECTIVE:   BP 113/74   Pulse 72   Ht 5' 4"$  (1.626 m)   Wt 215 lb (97.5 kg)   LMP 06/26/2022   SpO2 100%   BMI 36.90 kg/m   General: Patient well-appearing, in no acute distress. Resp: normal work of breathing noted GU: normal labia, no rashes or external lesions noted, normal vagina and cervix without discharge, no bleeding observed, no adnexal masses or tenderness noted   GU exam performed in the presence of chaperone, [employer], CMA.   ASSESSMENT/PLAN:   Vaginal odor -GC/Chlamydia, BV, trichomonas and candida testing pending -reiterated importance of maintaining good hygiene and ways to prevent BV and yeast infections -RPR 2/9 nonreactive, patient politely declined HIV and RPR testing today. I think this is appropriate as she is up to date on both tests and not involved with any new partners -follow up as appropriate   Papanicolaou smear -completed today along with HPV cotesting for cervical cancer screening, pending results     Reagen Goates Larae Grooms, [place]

## 2022-08-13 NOTE — Patient Instructions (Signed)
It was great seeing you today!  Today we discussed your vaginal odor and also did your PAP smear. I will let you know of the results when they return.   Please follow up at your next scheduled appointment, if anything arises between now and then, please don't hesitate to contact our office.   Thank you for allowing [place] to be a part of your medical care!  Thank you, Dr. Larae Grooms  Also a reminder of our clinic's no-show policy. Please make sure to arrive at least 15 minutes prior to your scheduled appointment time. Please try to cancel before 24 hours if you are not able to make it. If you no-show for 2 appointments then you will be receiving a warning letter. If you no-show after 3 visits, then you may be at risk of being dismissed from our clinic. This is to ensure that everyone is able to be seen in a timely manner. Thank you, we appreciate your assistance with this!

## 2022-08-13 NOTE — Assessment & Plan Note (Signed)
-  completed today along with HPV cotesting for cervical cancer screening, pending results

## 2022-08-14 ENCOUNTER — Encounter: Payer: Self-pay | Admitting: Neurology

## 2022-08-14 LAB — CERVICOVAGINAL ANCILLARY ONLY
Bacterial Vaginitis (gardnerella): POSITIVE — AB
Candida Glabrata: NEGATIVE
Candida Vaginitis: NEGATIVE
Chlamydia: NEGATIVE
Comment: NEGATIVE
Comment: NEGATIVE
Comment: NEGATIVE
Comment: NEGATIVE
Comment: NEGATIVE
Comment: NORMAL
Neisseria Gonorrhea: NEGATIVE
Trichomonas: NEGATIVE

## 2022-08-15 ENCOUNTER — Other Ambulatory Visit: Payer: Self-pay | Admitting: Family Medicine

## 2022-08-15 DIAGNOSIS — N760 Acute vaginitis: Secondary | ICD-10-CM

## 2022-08-15 LAB — CYTOLOGY - PAP
Adequacy: ABSENT
Comment: NEGATIVE
Diagnosis: NEGATIVE
High risk HPV: NEGATIVE

## 2022-08-15 MED ORDER — METRONIDAZOLE 500 MG PO TABS: 500.0000 mg | ORAL_TABLET | Freq: Two times a day (BID) | ORAL | 0 refills | Status: AC

## 2022-08-19 NOTE — Progress Notes (Deleted)
Initial neurology clinic note  SERVICE DATE: 08/20/22  Reason for Evaluation: Consultation requested by McDiarmid, Blane Ohara, MD for an opinion regarding numbness and tingling of hands. My final recommendations will be communicated back to the requesting physician by way of shared medical record or letter to requesting physician via [place] mail.  HPI: This is [name], a 37 y.o. ***-handed female with a medical history of *** who presents to neurology clinic with the chief complaint of numbness and tingling of hands. The patient is accompanied by ***.  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 EMG ordered?  On Lyrica 25 mg BID***  The patient has not*** had similar episodes of symptoms in the past. ***  Muscle bulk loss? *** Muscle pain? ***  Cramps/Twitching? *** Suggestion of myotonia/difficulty relaxing after contraction? ***  Fatigable weakness?*** Does strength improve after brief exercise?***  Able to brush hair/teeth without difficulty? *** Able to button shirts/use zips? *** Clumsiness/dropping grasped objects?*** Can you arise from squatted position easily? *** Able to get out of chair without using arms? *** Able to walk up steps easily? *** Use an assistive device to walk? *** Significant imbalance with walking? *** Falls?*** Any change in urine color, especially after exertion/physical activity? ***  The patient denies*** symptoms suggestive of oculobulbar weakness including diplopia, ptosis, dysphagia, poor saliva control, dysarthria/dysphonia, impaired mastication, facial weakness/droop.  There are no*** neuromuscular respiratory weakness symptoms, particularly orthopnea>dyspnea.   Pseudobulbar affect is absent***.  The patient does not*** report symptoms referable to autonomic dysfunction including impaired sweating, heat or cold intolerance, excessive mucosal dryness, gastroparetic early  satiety, postprandial abdominal bloating, constipation, bowel or bladder dyscontrol, erectile dysfunction*** or syncope/presyncope/orthostatic intolerance.  There are no*** complaints relating to other symptoms of small fiber modalities including paresthesia/pain.  The patient has not *** noticed any recent skin rashes nor does he*** report any constitutional symptoms like fever, night sweats, anorexia or unintentional weight loss.  EtOH use: ***  Restrictive diet? *** Family history of neuropathy/myopathy/NM disease?***  Previous labs, electrodiagnostics, and neuroimaging are summarized below, but pertinent findings include***  Any biopsy done? *** Current medications being tried for the patient's symptoms include ***  Prior medications that have been tried: ***   MEDICATIONS:  Outpatient Encounter Medications as of 08/20/2022  Medication Sig   acetaminophen (TYLENOL) 500 MG tablet Take 1,000 mg by mouth every 6 (six) hours as needed for mild pain.   diclofenac Sodium (VOLTAREN) 1 % GEL Apply 1 Application topically 2 (two) times daily as needed (pain).   ferrous sulfate 325 (65 FE) MG tablet Take 325 mg by mouth daily with breakfast.   fluticasone (FLONASE) 50 MCG/ACT nasal spray SHAKE LIQUID AND USE 2 SPRAYS IN EACH NOSTRIL DAILY (Patient taking differently: Place 2 sprays into both nostrils daily.)   megestrol (MEGACE) 40 MG tablet Take 1 tablet (40 mg total) by mouth 2 (two) times daily. Can increase to two tablets twice a day in the event of heavy bleeding   metroNIDAZOLE (FLAGYL) 500 MG tablet Take 1 tablet (500 mg total) by mouth 2 (two) times daily for 7 days.   Multiple Vitamin (MULTIVITAMIN) tablet Take 1 tablet by mouth in the morning.   Multiple Vitamins-Minerals (ZINC PO) Take 1 tablet by mouth in the morning.   pantoprazole (PROTONIX) 40 MG tablet TAKE 1 TABLET(40 MG) BY MOUTH DAILY (Patient taking differently: Take 40 mg by mouth in the morning.)  pregabalin (LYRICA) 25 MG  capsule Take 1 capsule (25 mg total) by mouth 2 (two) times daily.   Probiotic Product (PROBIOTIC PO) Take 1 capsule by mouth in the morning.   [DISCONTINUED] montelukast (SINGULAIR) 10 MG tablet Take 1 tablet (10 mg total) by mouth at bedtime.   No facility-administered encounter medications on file as of 08/20/2022.    PAST MEDICAL HISTORY: Past Medical History:  Diagnosis Date   Anemia    Anxiety    Asthma    Blood transfusion without reported diagnosis 2016   Chiari I malformation ([place])    COVID 07/14/2020   Empty sella ([place])    Irregular heart rhythm    Low grade squamous intraepith lesion on cytologic smear cervix (lgsil) 01/15/2018   Menorrhagia     PAST SURGICAL HISTORY: Past Surgical History:  Procedure Laterality Date   BREAST BIOPSY Left 12/2020   CHOLECYSTECTOMY     TUBAL LIGATION      ALLERGIES: Allergies  Allergen Reactions   Penicillins Anaphylaxis, Hives and Itching    Has patient had a PCN reaction causing immediate rash, facial/tongue/throat swelling, SOB or lightheadedness with hypotension: Yes Has patient had a PCN reaction causing severe rash involving mucus membranes or skin necrosis: No Has patient had a PCN reaction that required hospitalization No Has patient had a PCN reaction occurring within the last 10 years: Yes If all of the above answers are "NO", then may proceed with Cephalosporin use.    Shrimp [Shellfish Allergy] Anaphylaxis, Hives and Itching    FAMILY HISTORY: Family History  Problem Relation Age of Onset   Diabetes Father    Kidney disease Father        failure due to diabetes   Hypertension Mother    Heart attack Maternal Grandmother 55    SOCIAL HISTORY: Social History   Tobacco Use   Smoking status: Former    Packs/day: 0.10    Years: 2.00    Total pack years: 0.20    Types: Cigarettes    Quit date: 05/25/2007    Years since quitting: 15.2   Smokeless tobacco: Never  Vaping Use   Vaping Use: Never used  Substance  Use Topics   Alcohol use: Yes    Alcohol/week: 1.0 standard drink of alcohol    Types: 1 Glasses of wine per week    Comment: occaional   Drug use: No   Social History   Social History Narrative   Not on file     OBJECTIVE: PHYSICAL EXAM: LMP 06/26/2022   General:*** General appearance: Awake and alert. No distress. Cooperative with exam.  Skin: No obvious rash or jaundice. HEENT: Atraumatic. Anicteric. Lungs: Non-labored breathing on room air  Heart: Regular Abdomen: Soft, non tender. Extremities: No edema. No obvious deformity.  Musculoskeletal: No obvious joint swelling. Psych: Affect appropriate.  Neurological: Mental Status: Alert. Speech fluent. No pseudobulbar affect Cranial Nerves: CNII: No RAPD. Visual fields grossly intact. CNIII, IV, VI: PERRL. No nystagmus. EOMI. CN V: Facial sensation intact bilaterally to fine touch. [place] clench strong. Jaw jerk***. CN VII: Facial muscles symmetric and strong. No ptosis at rest or after sustained upgaze***. CN VIII: Hearing grossly intact bilaterally. CN IX: No hypophonia. CN X: Palate elevates symmetrically. CN XI: Full strength shoulder shrug bilaterally. CN XII: Tongue protrusion full and midline. No atrophy or fasciculations. No significant dysarthria*** Motor: Tone is ***. *** fasciculations in *** extremities. *** atrophy. No grip or percussive myotonia.***  Individual muscle group testing (MRC grade out  of 5):  Movement     Neck flexion ***    Neck extension ***     Right Left   Shoulder abduction *** ***   Shoulder adduction *** ***   Shoulder ext rotation *** ***   Shoulder int rotation *** ***   Elbow flexion *** ***   Elbow extension *** ***   Wrist extension *** ***   Wrist flexion *** ***   Finger abduction - FDI *** ***   Finger abduction - ADM *** ***   Finger extension *** ***   Finger distal flexion - 2/3 *** ***   Finger distal flexion - 4/5 *** ***   Thumb flexion - FPL *** ***    Thumb abduction - APB *** ***    Hip flexion *** ***   Hip extension *** ***   Hip adduction *** ***   Hip abduction *** ***   Knee extension *** ***   Knee flexion *** ***   Dorsiflexion *** ***   Plantarflexion *** ***   Inversion *** ***   Eversion *** ***   Great toe extension *** ***   Great toe flexion *** ***     Reflexes:  Right Left   Bicep *** ***   Tricep *** ***   BrRad *** ***   Knee *** ***   Ankle *** ***    Pathological Reflexes: Babinski: *** response bilaterally*** Hoffman: *** Troemner: *** Pectoral: *** Palmomental: *** Facial: *** Midline tap: *** Sensation: Pinprick: *** Vibration: *** Temperature: *** Proprioception: *** Coordination: Intact finger-to- nose-finger bilaterally. Romberg negative.*** Gait: Able to rise from chair with arms crossed unassisted. Normal, narrow-based gait. Able to tandem walk. Able to walk on toes and heels.***  Lab and Test Review: Internal labs: 08/02/22: TSH: 1.62 B12: 409 CBC: significant for chronic anemia (Hb 9.4), MCV 69 BMP: unremarkable RPR: non reactive  CK (08/31/21): 103 ***  External labs: ***  Imaging: CT cervical spine wo contrast (01/22/21): FINDINGS: Alignment: Mild nonspecific reversal of the expected cervical lordosis. No significant spondylolisthesis.   Skull base and vertebrae: The basion-dental and atlanto-dental intervals are maintained.No evidence of acute fracture to the cervical spine.   Soft tissues and spinal canal: No prevertebral fluid or swelling. No visible canal hematoma.   Disc levels: Cervical spondylosis. Most notably at C5-C6, there is mild-to-moderate disc space narrowing with a disc bulge and uncovertebral hypertrophy on the left. No appreciable high-grade spinal canal stenosis. No significant bony neural foraminal narrowing. C5-C6 ventral osteophyte.   Upper chest: No consolidation within the imaged lung apices. No visible pneumothorax.   Other:  Incidentally noted, the right first rib appears to also articulate with the right C7 transverse process.   IMPRESSION: No evidence of acute fracture to the cervical spine.   Mild nonspecific reversal of the expected cervical lordosis.   Cervical spondylosis, as described and greatest at C5-C6.  ASSESSMENT: Breanna Miranda is a 37 y.o. female who presents for evaluation of ***. *** has a relevant medical history of ***. *** neurological examination is pertinent for ***. Available diagnostic data is significant for ***. This constellation of symptoms and objective data would most likely localize to ***. ***  PLAN: -Blood work: *** ***  -Return to clinic ***  The impression above as well as the plan as outlined below were extensively discussed with the patient (in the company of ***) who voiced understanding. All questions were answered to their satisfaction.  The patient was counseled on pertinent fall  precautions per the printed material provided today, and as noted under the "Patient Instructions" section below.***  When available, results of the above investigations and possible further recommendations will be communicated to the patient via telephone/MyChart. Patient to call office if not contacted after expected testing turnaround time.   Total time spent reviewing records, interview, history/exam, documentation, and coordination of care on day of encounter:  *** min   Thank you for allowing me to participate in patient's care.  If I can answer any additional questions, I would be pleased to do so.  Kai Levins, MD   CC: Shary Key, DO [place] [id number]  CC: Referring provider: McDiarmid, Blane Ohara, MD [address] [place],  [place] [address]

## 2022-08-20 ENCOUNTER — Ambulatory Visit: Payer: Medicaid Other | Admitting: Neurology

## 2022-08-26 NOTE — Progress Notes (Signed)
  SUBJECTIVE:   CHIEF COMPLAINT / HPI:   HEADACHE Headache started in the left frontal region, makes eyes water, difficulty with noises and light   Pain is severe, makes her vomit at night, has been vomiting the past two nights.  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 Was referred to NSGY at that time. Additionally, had parasthesias of UE this year, was referred to neurology.    PERTINENT  PMH / PSH:   Past Medical History:  Diagnosis Date   Anemia    Anxiety    Asthma    Blood transfusion without reported diagnosis 2016   Chiari I malformation ([place])    COVID 07/14/2020   Empty sella ([place])    Irregular heart rhythm    Low grade squamous intraepith lesion on cytologic smear cervix (lgsil) 01/15/2018   Menorrhagia     OBJECTIVE:  BP 112/76   Pulse 70   Ht '5\' 4"'$  (1.626 m)   Wt 213 lb 6 oz (96.8 kg)   LMP 06/26/2022   SpO2 100%   BMI 36.63 kg/m  Physical Exam  General: Alert and oriented in no apparent distress Heart: Regular rate and rhythm with no murmurs appreciated Lungs: CTA bilaterally, no wheezing Abdomen: Bowel sounds present, no abdominal pain Skin: Warm and dry Extremities: No lower extremity edema Neuro: CN II: PERRL CN III, IV,VI: EOMI CN VIII: Normal hearing CN IX,X: Symmetric palate raise  CN XII: Symmetric tongue protrusion     ASSESSMENT/PLAN:  Frequent headaches Assessment & Plan: Given presentation with positional vomiting, worsening symptoms, h/o Chiari 1 malformation and parasthesias of the  BUE, I feel patient should be evaluated in the ED. She agrees with this plan, ddx broad given history including IIH, Chiari 1 as cause of symptoms, cluster headache, migraine, CVA vs TIA. Called inpatient FMTS team to update them about patient. Patient amenable to walking to ED at this time.       Erskine Emery, MD 08/27/2022, 11:44 AM PGY-2, [place]

## 2022-08-27 ENCOUNTER — Ambulatory Visit (INDEPENDENT_AMBULATORY_CARE_PROVIDER_SITE_OTHER): Payer: Medicaid Other | Admitting: Student

## 2022-08-27 VITALS — BP 112/76 | HR 70 | Ht 64.0 in | Wt 213.4 lb

## 2022-08-27 DIAGNOSIS — R519 Headache, unspecified: Secondary | ICD-10-CM | POA: Insufficient documentation

## 2022-08-27 NOTE — Assessment & Plan Note (Signed)
Given presentation with positional vomiting, worsening symptoms, h/o Chiari 1 malformation and parasthesias of the BUE, I feel patient should be evaluated in the ED. She agrees with this plan, ddx broad given history including IIH, Chiari 1 as cause of symptoms, cluster headache, migraine, CVA vs TIA. Called inpatient FMTS team to update them about patient. Patient amenable to walking to ED at this time.

## 2022-08-30 ENCOUNTER — Other Ambulatory Visit: Payer: Self-pay | Admitting: Family Medicine

## 2022-08-30 MED ORDER — PREGABALIN 25 MG PO CAPS: 25.0000 mg | ORAL_CAPSULE | Freq: Two times a day (BID) | ORAL | 1 refills | Status: DC

## 2022-09-06 ENCOUNTER — Ambulatory Visit: Payer: Medicaid Other | Admitting: Neurology

## 2022-09-09 NOTE — Progress Notes (Signed)
Initial neurology clinic note  SERVICE DATE: 09/13/22  Reason for Evaluation: Consultation requested by Shary Key, DO for an opinion regarding numbness and tingling in hands. My final recommendations will be communicated back to the requesting physician by way of shared medical record or letter to requesting physician via [place] mail.  HPI: This is [name], a 37 y.o. left-handed female with a medical history of first degree AV block, GERD, depression, iron deficiency anemia, fibroids who presents to neurology clinic with the chief complaint of numbness and tingling in legs and hands. The patient is alone today.  Patient started megace in 07/2022 for fibroids. She will be having a hysterectomy next week. About 1 week after starting it, it caused achiness of arms then feet. She gets frequent cramps. She mentioned this to her doctors, but this was not a known side effect and felt to be unrelated to symptoms. She stopped taking the medication for about 3 days, but did not change symptoms. She now has tingling in both shoulders into both hands. Initially the left arm was worse but now the right. She has tingling in her feet as well. She has severe cramps where her toes will curl and get stuck. If she is using her hands, they will cramp up. She denies neck or back pain. She denies any changes to bowel or bladder. She denies imbalance or falls.  She has not had significant symptoms until 07/2022 including vision changes, numbness, tingling, or weakness.  Patient has been prescribed Lyrica 50 mg BID. She has been on this 3-4 weeks and has not noticed a difference.  Patient has been told she may have carpal tunnel in the past and responded to splinting. Her current symptoms are significantly different per patient.  She does not report any constitutional symptoms like fever, night sweats, anorexia or unintentional weight loss. She has gained 17 pounds since starting megace.  EtOH use:  Very rare wine  Restrictive diet? No Family history of neuropathy/myopathy/neurologic disease? Mother has fibromyalgia, whose symptoms are similar to patient  Patient has never had an EMG.   MEDICATIONS:  Outpatient Encounter Medications as of 09/13/2022  Medication Sig   acetaminophen (TYLENOL) 500 MG tablet Take 1,000 mg by mouth every 6 (six) hours as needed for mild pain.   Cyanocobalamin (VITAMIN B-12 PO) Take by mouth.   diclofenac Sodium (VOLTAREN) 1 % GEL Apply 1 Application topically 2 (two) times daily as needed (pain).   ferrous sulfate 325 (65 FE) MG tablet Take 325 mg by mouth daily with breakfast.   fluticasone (FLONASE) 50 MCG/ACT nasal spray SHAKE LIQUID AND USE 2 SPRAYS IN EACH NOSTRIL DAILY (Patient taking differently: Place 2 sprays into both nostrils daily as needed for allergies.)   megestrol (MEGACE) 40 MG tablet Take 1 tablet (40 mg total) by mouth 2 (two) times daily. Can increase to two tablets twice a day in the event of heavy bleeding   Multiple Vitamins-Minerals (ZINC PO) Take 1 tablet by mouth in the morning.   pantoprazole (PROTONIX) 40 MG tablet TAKE 1 TABLET(40 MG) BY MOUTH DAILY (Patient taking differently: Take 20-40 mg by mouth in the morning.)   pregabalin (LYRICA) 25 MG capsule Take 1 capsule (25 mg total) by mouth 2 (two) times daily. (Patient taking differently: Take 50 mg by mouth 2 (two) times daily.)   [DISCONTINUED] montelukast (SINGULAIR) 10 MG tablet Take 1 tablet (10 mg total) by mouth at bedtime.   [DISCONTINUED] Multiple Vitamin (MULTIVITAMIN) tablet  Take 1 tablet by mouth in the morning.   [DISCONTINUED] Probiotic Product (PROBIOTIC PO) Take 1 capsule by mouth in the morning.   No facility-administered encounter medications on file as of 09/13/2022.    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 (lgsil) 01/15/2018   Menorrhagia     PAST SURGICAL HISTORY: Past Surgical History:  Procedure Laterality Date   BREAST BIOPSY Left 12/2020   CHOLECYSTECTOMY     TUBAL LIGATION      ALLERGIES: Allergies  Allergen Reactions   Penicillins Anaphylaxis, Hives and Itching    Has patient had a PCN reaction causing immediate rash, facial/tongue/throat swelling, SOB or lightheadedness with hypotension: Yes Has patient had a PCN reaction causing severe rash involving mucus membranes or skin necrosis: No Has patient had a PCN reaction that required hospitalization No Has patient had a PCN reaction occurring within the last 10 years: Yes If all of the above answers are "NO", then may proceed with Cephalosporin use.    Shrimp [Shellfish Allergy] Anaphylaxis, Hives and Itching    FAMILY HISTORY: Family History  Problem Relation Age of Onset   Diabetes Father    Kidney disease Father        failure due to diabetes   Hypertension Mother    Heart attack Maternal Grandmother 29    SOCIAL HISTORY: Social History   Tobacco Use   Smoking status: Former    Packs/day: 0.10    Years: 2.00    Additional pack years: 0.00    Total pack years: 0.20    Types: Cigarettes    Quit date: 05/25/2007    Years since quitting: 15.3   Smokeless tobacco: Never  Vaping Use   Vaping Use: Never used  Substance Use Topics   Alcohol use: Yes    Alcohol/week: 1.0 standard drink of alcohol    Types: 1 Glasses of wine per week    Comment: occaional   Drug use: No   Social History   Social History Narrative   Are you right handed or  left handed? left   Are you currently employed ?    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   Who lives with you? Husband and two children   What type of home do you live in: 1 story or 2 story? two    Caffeine none     OBJECTIVE: PHYSICAL EXAM: BP 114/67   Pulse 87   Ht 5\' 4"  (1.626 m)   Wt 220 lb (99.8 kg)   LMP 06/26/2022   SpO2 100%   BMI 37.76 kg/m   General: General appearance: Awake and alert. No distress. Cooperative with exam.  Skin: No obvious rash or jaundice. HEENT: Atraumatic. Anicteric. Lungs: Non-labored breathing on room air  Extremities: No edema. Right 2nd digit of foot with hammertoe like deformity. Diffusely tender to palpation  at joints and muscles, especially in upper extremities. Most tender in right thumb and 2nd digit of right foot Musculoskeletal: No obvious joint swelling. Psych: Affect appropriate.  Neurological: Mental Status: Alert. Speech fluent. No pseudobulbar affect Cranial Nerves: CNII: No RAPD. Visual fields grossly intact. CNIII, IV, VI: PERRL. No nystagmus. EOMI. CN V: Facial sensation intact bilaterally to fine touch. CN VII: Facial muscles symmetric and strong. No ptosis at rest. CN VIII: Hearing grossly intact bilaterally. CN IX: No hypophonia. CN X: Palate elevates symmetrically. CN XI: Full strength shoulder shrug bilaterally. CN XII: Tongue protrusion full and midline. No atrophy or fasciculations. No significant dysarthria Motor: Tone is normal. No atrophy. No evidence of myotonia Strength 5/5 in bilateral upper and lower extremities (including bilateral APB), with some limitation due to pain  Reflexes: 2+ and symmetric throughout  Pathological Reflexes: Babinski: flexor response bilaterally Hoffman: absent bilaterally Troemner: absent bilaterally Sensation: Pinprick: Intact in all extremities Vibration: Intact in all extremities Proprioception: Intact in bilateral great  toes Coordination: Intact finger-to- nose-finger bilaterally. Romberg negative. Gait: Able to rise from chair with arms crossed unassisted. Normal, narrow-based gait. Able to walk on toes and heels.  Lab and Test Review: Internal labs: TSH (08/02/22): 1.62 B12 (08/02/22): 409 CBC (08/02/22): Hb 9.4 (chronic), MCV 69 BMP (09/12/22): unremarkable  Imaging: CT cervical spine wo contrast (01/22/21): FINDINGS: Alignment: Mild nonspecific reversal of the expected cervical lordosis. No significant spondylolisthesis.   Skull base and vertebrae: The basion-dental and atlanto-dental intervals are maintained.No evidence of acute fracture to the cervical spine.   Soft tissues and spinal canal: No prevertebral fluid or swelling. No visible canal hematoma.   Disc levels: Cervical spondylosis. Most notably at C5-C6, there is mild-to-moderate disc space narrowing with a disc bulge and uncovertebral hypertrophy on the left. No appreciable high-grade spinal canal stenosis. No significant bony neural foraminal narrowing. C5-C6 ventral osteophyte.   Upper chest: No consolidation within the imaged lung apices. No visible pneumothorax.   Other: Incidentally noted, the right first rib appears to also articulate with the right C7 transverse process.   IMPRESSION: No evidence of acute fracture to the cervical spine.   Mild nonspecific reversal of the expected cervical lordosis.   Cervical spondylosis, as described and greatest at C5-C6.  ASSESSMENT: TYME BRUMIT is a 37 y.o. female who presents for evaluation of diffuse pain with numbness and tingling in arms and legs bilaterally. She has a relevant medical history of first degree AV block, GERD, depression, iron deficiency anemia, fibroids. Her neurological examination is essentially normal. She does have diffuse pain to palpation of all extremities, most in the RUE, at joints and muscles but without weakness or diminished sensation. Available  diagnostic data is significant for B12 of 403, chronic anemia on CBC, and unremarkable BMP. The etiology of patient's symptoms is currently unclear. While she mentions a possible history of carpal tunnel, her current symptoms are much different by her report. She has more achiness and symptoms are from bilateral shoulders and in both legs. She relates that her symptoms started soon after starting Megace. I do not see these symptoms as known side effects of the medication though. Her symptoms sound more rheumatologic or like fibromyalgia, which her mother also has (with similar symptoms to patient). I will get lab work today to look for treatable causes. We discussed EMG, but given that it is thought to be low yield, will defer for now. She is not benefiting from Lyrica, so we will stop this and try  Cymbalta.  PLAN: -Blood work: vit D, PTH, ionized Ca, ANA, ENA, ESR, CRP, CK -Will taper off Lyrica as she does not feel benefit:  -Reduce to 25 mg BID today for 1 week, then  -Reduce to 25 mg daily for 1 week, then stop -Start Cymbalta 30 mg qhs for 1 week, then increase to 60 mg qhs  -Return to clinic in 3 months  The impression above as well as the plan as outlined below were extensively discussed with the patient who voiced understanding. All questions were answered to their satisfaction.  When available, results of the above investigations and possible further recommendations will be communicated to the patient via telephone/MyChart. Patient to call office if not contacted after expected testing turnaround time.   Total time spent reviewing records, interview, history/exam, documentation, and coordination of care on day of encounter:  55 min   Thank you for allowing me to participate in patient's care.  If I can answer any additional questions, I would be pleased to do so.  Kai Levins, MD   CC: Shary Key, DO [place] [id number]  CC: Referring provider: Shary Key, DO [address] [place],  [place] [address]

## 2022-09-11 NOTE — Pre-Procedure Instructions (Signed)
Surgical Instructions    Your procedure is scheduled on September 17, 2022.  Report to [facility] Main Entrance "A" at 5:30 A.M., then check in with the Admitting office.  Call this number if you have problems the morning of surgery:  [phone]   If you have any questions prior to your surgery date call [phone]: Open Monday-Friday 8am-4pm If you experience any cold or flu symptoms such as cough, fever, chills, shortness of breath, etc. between now and your scheduled surgery, please notify us at the above number     Remember:  Do not eat after midnight the night before your surgery  You may drink clear liquids until 4:30AM the morning of your surgery.   Clear liquids allowed are: Water, Non-Citrus Juices (without pulp), Carbonated Beverages, Clear Tea, Black Coffee ONLY (NO MILK, CREAM OR POWDERED CREAMER of any kind), and Gatorade    Take these medicines the morning of surgery with A SIP OF WATER:  megestrol (MEGACE)  pantoprazole (PROTONIX)  pregabalin (LYRICA)   IF Needed: acetaminophen (TYLENOL)  fluticasone (FLONASE) 50 MCG/ACT nasal spray    As of today, STOP taking any Aspirin (unless otherwise instructed by your surgeon) Aleve, Naproxen, Ibuprofen, Motrin, Advil, Goody's, BC's, all herbal medications, fish oil, diclofenac Sodium (VOLTAREN) 1 % GEL, and all vitamins.  *Please be ready to provide urine sample once you arrive to the [facility]-op [facility] is not responsible for any belongings or valuables.    Do NOT Smoke (Tobacco/Vaping)  24 hours prior to your procedure  If you use a CPAP at night, you may bring your mask for your overnight stay.   Contacts, glasses, hearing aids, dentures or partials may not be worn into surgery, please bring cases for these belongings   For patients admitted to the hospital, discharge time will be determined by your treatment team.   Patients discharged the day of surgery will not be allowed to drive home, and someone  needs to stay with them for 24 hours.   SURGICAL WAITING ROOM VISITATION Patients having surgery or a procedure may have no more than 2 support people in the waiting area - these visitors may rotate.   Children under the age of 74 must have an adult with them who is not the patient. If the patient needs to stay at the hospital during part of their recovery, the visitor guidelines for inpatient rooms apply. Pre-op nurse will coordinate an appropriate time for 1 support person to accompany patient in pre-op.  This support person may not rotate.   Please refer to [email or web address] for the visitor guidelines for Inpatients (after your surgery is over and you are in a regular room).    Special instructions:    Oral Hygiene is also important to reduce your risk of infection.  Remember - BRUSH YOUR TEETH THE MORNING OF SURGERY WITH YOUR REGULAR TOOTHPASTE   [place]- Preparing For Surgery  Before surgery, you can play an important role. Because skin is not sterile, your skin needs to be as free of germs as possible. You can reduce the number of germs on your skin by washing with CHG (chlorahexidine gluconate) Soap before surgery.  CHG is an antiseptic cleaner which kills germs and bonds with the skin to continue killing germs even after washing.     Please do not use if you have an allergy to CHG or antibacterial soaps. If your skin becomes reddened/irritated stop using the CHG.  Do not  shave (including legs and underarms) for at least 48 hours prior to first CHG shower. It is OK to shave your face.  Please follow these instructions carefully.     Shower the NIGHT BEFORE SURGERY and the MORNING OF SURGERY with CHG Soap.   If you chose to wash your hair, wash your hair first as usual with your normal shampoo. After you shampoo, rinse your hair and body thoroughly to remove the shampoo.  Then [employer] and genitals (private parts) with your  normal soap and rinse thoroughly to remove soap.  After that Use CHG Soap as you would any other liquid soap. You can apply CHG directly to the skin and wash gently with a scrungie or a clean washcloth.   Apply the CHG Soap to your body ONLY FROM THE NECK DOWN.  Do not use on open wounds or open sores. Avoid contact with your eyes, ears, mouth and genitals (private parts). Wash Face and genitals (private parts)  with your normal soap.   Wash thoroughly, paying special attention to the area where your surgery will be performed.  Thoroughly rinse your body with warm water from the neck down.  DO NOT shower/wash with your normal soap after using and rinsing off the CHG Soap.  Pat yourself dry with a CLEAN TOWEL.  Wear CLEAN PAJAMAS to bed the night before surgery  Place CLEAN SHEETS on your bed the night before your surgery  DO NOT SLEEP WITH PETS.   Day of Surgery:  Take a shower with CHG soap. Wear Clean/Comfortable clothing the morning of surgery Do not wear jewelry or makeup. Do not wear lotions, powders, perfumes/cologne or deodorant. Do not shave 48 hours prior to surgery.  Men may shave face and neck. Do not bring valuables to the hospital. Do not wear nail polish, gel polish, artificial nails, or any other type of covering on natural nails (fingers and toes) If you have artificial nails or gel coating that need to be removed by a nail salon, please have this removed prior to surgery. Artificial nails or gel coating may interfere with anesthesia's ability to adequately monitor your vital signs. Remember to brush your teeth WITH YOUR REGULAR TOOTHPASTE.    If you received a COVID test during your pre-op visit, it is requested that you wear a mask when out in public, stay away from anyone that may not be feeling well, and notify your surgeon if you develop symptoms. If you have been in contact with anyone that has tested positive in the last 10 days, please notify your  surgeon.    Please read over the following fact sheets that you were given.

## 2022-09-11 NOTE — H&P (Signed)
Breanna Miranda is an 37 y.o. female, G4P4 with uterine fibroids, [facility] and IDA secondary to [facility]. GYN U/S uterine fibroids, 570 gms. Pt desires definite therapy.  H/O TSVD x 4 ( 7#6oz)  Menstrual History: Menarche age: 15 No LMP recorded.    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 Her smoking use included cigarettes. She has a 0.20 pack-year smoking history. She has never used smokeless tobacco. She reports current alcohol use of about 1.0 standard drink of alcohol per week. She reports that she does not use drugs.  Allergies:  Allergies  Allergen Reactions   Penicillins Anaphylaxis, Hives and Itching    Has patient had a PCN reaction causing immediate rash, facial/tongue/throat swelling, SOB or lightheadedness with hypotension: Yes Has patient had a PCN reaction causing severe rash involving mucus membranes or skin necrosis: No Has patient had a PCN reaction that required hospitalization No Has patient had a PCN reaction occurring within the last 10 years: Yes If all of the above answers are "NO", then may proceed with Cephalosporin use.    Shrimp [Shellfish Allergy] Anaphylaxis, Hives and Itching    No medications prior to admission.    Review of Systems  Constitutional: Negative.   Respiratory: Negative.     Cardiovascular: Negative.   Gastrointestinal: Negative.   Genitourinary:  Positive for menstrual problem.    There were no vitals taken for this visit. Physical Exam Constitutional:      Appearance: Normal appearance.  Cardiovascular:     Rate and Rhythm: Normal rate and regular rhythm.     Heart sounds: Normal heart sounds.  Pulmonary:     Effort: Pulmonary effort is normal.     Breath sounds: Normal breath sounds.  Abdominal:     General: Bowel sounds are normal.     Palpations: Abdomen is soft.  Genitourinary:    Comments: Nl EGBUS, Uterus @ 10 weeks size, mobile non tender, no masses Neurological:     Mental Status: She is alert.     No results found for this or any previous visit (from the past 24 hour(s)).  No results found.  Assessment/Plan: Uterine fibroids HMC IDA  TVH with possible salpingectomy reviewed with pt. R/B/Post op care reviewed. Pt verbalized understanding and desires to proceed.   Chancy Milroy 09/11/2022, 9:16 AM

## 2022-09-12 ENCOUNTER — Encounter (HOSPITAL_COMMUNITY): Payer: Self-pay

## 2022-09-12 ENCOUNTER — Encounter (HOSPITAL_COMMUNITY)
Admission: RE | Admit: 2022-09-12 | Discharge: 2022-09-12 | Disposition: A | Discharge status: Home / Self Care | Payer: Medicaid Other | Source: Ambulatory Visit | Attending: Obstetrics and Gynecology | Admitting: Obstetrics and Gynecology

## 2022-09-12 ENCOUNTER — Other Ambulatory Visit: Payer: Self-pay

## 2022-09-12 DIAGNOSIS — Z01812 Encounter for preprocedural laboratory examination: Secondary | ICD-10-CM | POA: Insufficient documentation

## 2022-09-12 DIAGNOSIS — D259 Leiomyoma of uterus, unspecified: Secondary | ICD-10-CM

## 2022-09-12 HISTORY — DX: Family history of other specified conditions: Z84.89

## 2022-09-12 HISTORY — DX: Depression, unspecified: F32.A

## 2022-09-12 HISTORY — DX: Gastro-esophageal reflux disease without esophagitis: K21.9

## 2022-09-12 HISTORY — DX: Dyspnea, unspecified: R06.00

## 2022-09-12 HISTORY — DX: Other complications of anesthesia, initial encounter: T88.59XA

## 2022-09-12 LAB — BASIC METABOLIC PANEL
Anion gap: 7 (ref 5–15)
BUN: 5 mg/dL — ABNORMAL LOW (ref 6–20)
CO2: 22 mmol/L (ref 22–32)
Calcium: 9 mg/dL (ref 8.9–10.3)
Chloride: 109 mmol/L (ref 98–111)
Creatinine, Ser: 0.79 mg/dL (ref 0.44–1.00)
GFR, Estimated: >60 mL/min (ref >60)
Glucose, Bld: 101 mg/dL — ABNORMAL HIGH (ref 70–99)
Potassium: 3.7 mmol/L (ref 3.5–5.1)
Sodium: 138 mmol/L (ref 135–145)

## 2022-09-12 LAB — CBC
HCT: 36.9 % (ref 36.0–46.0)
Hemoglobin: 10.8 g/dL — ABNORMAL LOW (ref 12.0–15.0)
MCH: 19.8 pg — ABNORMAL LOW (ref 26.0–34.0)
MCHC: 29.3 g/dL — ABNORMAL LOW (ref 30.0–36.0)
MCV: 67.7 fL — ABNORMAL LOW (ref 80.0–100.0)
Platelets: 258 10*3/uL (ref 150–400)
RBC: 5.45 MIL/uL — ABNORMAL HIGH (ref 3.87–5.11)
RDW: 20.9 % — ABNORMAL HIGH (ref 11.5–15.5)
WBC: 4.7 10*3/uL (ref 4.0–10.5)
nRBC: 0 % (ref 0.0–0.2)

## 2022-09-12 LAB — TYPE AND SCREEN
ABO/RH(D): B POS
Antibody Screen: NEGATIVE

## 2022-09-12 NOTE — Progress Notes (Signed)
PCP - Sonia Side, DO  Cardiologist - Larae Grooms, MD  PPM/ICD - Denies  Chest x-ray - 01/25/2022 EKG - 06/25/2022 Stress Test - Denies ECHO - 06/28/2022 Cardiac Cath - Denies  Sleep Study - Denies  DM:   Blood Thinner Instructions: N/A Aspirin Instructions: N/A  ERAS Protcol - Yes PRE-SURGERY Ensure or G2- No Drink  COVID TEST- N/A   Anesthesia review: Yes, cardiac history  Patient denies shortness of breath, fever, cough and chest pain at PAT appointment   All instructions explained to the patient, with a verbal understanding of the material. Patient agrees to go over the instructions while at home for a better understanding. The opportunity to ask questions was provided.

## 2022-09-13 ENCOUNTER — Other Ambulatory Visit (INDEPENDENT_AMBULATORY_CARE_PROVIDER_SITE_OTHER): Payer: Medicaid Other

## 2022-09-13 ENCOUNTER — Encounter: Payer: Self-pay | Admitting: Neurology

## 2022-09-13 ENCOUNTER — Ambulatory Visit: Payer: Medicaid Other | Admitting: Neurology

## 2022-09-13 VITALS — BP 114/67 | HR 87 | Ht 64.0 in | Wt 220.0 lb

## 2022-09-13 DIAGNOSIS — R202 Paresthesia of skin: Secondary | ICD-10-CM

## 2022-09-13 DIAGNOSIS — R52 Pain, unspecified: Secondary | ICD-10-CM | POA: Diagnosis not present

## 2022-09-13 DIAGNOSIS — M797 Fibromyalgia: Secondary | ICD-10-CM | POA: Diagnosis not present

## 2022-09-13 DIAGNOSIS — R200 Anesthesia of skin: Secondary | ICD-10-CM

## 2022-09-13 LAB — CK: Total CK: 171 U/L (ref 7–177)

## 2022-09-13 LAB — VITAMIN D 25 HYDROXY (VIT D DEFICIENCY, FRACTURES): VITD: 11.26 ng/mL — ABNORMAL LOW (ref 30.00–100.00)

## 2022-09-13 LAB — SEDIMENTATION RATE: Sed Rate: 60 mm/hr — ABNORMAL HIGH (ref 0–20)

## 2022-09-13 LAB — C-REACTIVE PROTEIN: CRP: <1 mg/dL (ref 0.5–20.0)

## 2022-09-13 MED ORDER — DULOXETINE HCL 30 MG PO CPEP: 60.0000 mg | ORAL_CAPSULE | Freq: Every day | ORAL | 6 refills | Status: DC

## 2022-09-13 NOTE — Progress Notes (Signed)
Anesthesia Chart Review:  Recent cardiology evaluation for palpitations.  Seen by Dr. Danelle Berry on 05/31/2022 and event monitor and echo ordered.  Event monitor showed NSR with rare PACs/PVCs, symptoms correlated to PACs.  No sustained arrhythmias.  No atrial fibrillation.  Echo showed normal biventricular function, normal valves.  Cardiology follow-up as needed.  Preop labs reviewed, chronic stable anemia with hemoglobin 10.8, otherwise unremarkable.  EKG 06/25/2022: NSR.  Rate 77.  Event monitor 07/09/2022:   Normal sinus rhythm with rare PACs and rare PVCs.   Symptoms correlated to PACs.   No sustained arrhythmias.   No atrial fibrillation.  TTE 06/28/2022: 1. Left ventricular ejection fraction, by estimation, is 60 to 65%. The  left ventricle has normal function. The left ventricle has no regional  wall motion abnormalities. Left ventricular diastolic parameters were  normal.   2. Right ventricular systolic function is normal. The right ventricular  size is normal. Tricuspid regurgitation signal is inadequate for assessing  PA pressure.   3. The mitral valve is normal in structure. No evidence of mitral valve  regurgitation. No evidence of mitral stenosis.   4. The aortic valve is normal in structure. Aortic valve regurgitation is  not visualized. No aortic stenosis is present.   5. The inferior vena cava is normal in size with greater than 50%  respiratory variability, suggesting right atrial pressure of 3 mmHg.      Breanna Miranda [facility] Short Stay Center/Anesthesiology Phone [phone] 09/13/2022 11:34 AM

## 2022-09-13 NOTE — Anesthesia Preprocedure Evaluation (Signed)
Anesthesia Evaluation  Patient identified by MRN, date of birth, ID band Patient awake    Reviewed: Allergy & Precautions, NPO status , Patient's Chart, lab work & pertinent test results  History of Anesthesia Complications (+) Family history of anesthesia reaction and history of anesthetic complications  Airway Mallampati: III  TM Distance: >3 FB Neck ROM: Full    Dental  (+) Dental Advisory Given, Teeth Intact, Missing   Pulmonary shortness of breath, asthma , former smoker   Pulmonary exam normal breath sounds clear to auscultation       Cardiovascular Normal cardiovascular exam+ dysrhythmias  Rhythm:Regular Rate:Normal  Echo 06/28/2022 1. Left ventricular ejection fraction, by estimation, is 60 to 65%. The left ventricle has normal function. The left ventricle has no regional wall motion abnormalities. Left ventricular diastolic parameters were normal.  2. Right ventricular systolic function is normal. The right ventricular size is normal. Tricuspid regurgitation signal is inadequate for assessing PA pressure.  3. The mitral valve is normal in structure. No evidence of mitral valve regurgitation. No evidence of mitral stenosis.  4. The aortic valve is normal in structure. Aortic valve regurgitation is not visualized. No aortic stenosis is present.  5. The inferior vena cava is normal in size with greater than 50% respiratory variability, suggesting right atrial pressure of 3 mmHg.       Neuro/Psych  Headaches PSYCHIATRIC DISORDERS Anxiety Depression       GI/Hepatic Neg liver ROS,GERD  ,,  Endo/Other  negative endocrine ROS    Renal/GU negative Renal ROS     Musculoskeletal negative musculoskeletal ROS (+)    Abdominal  (+) + obese  Peds  Hematology  (+) Blood dyscrasia, anemia   Anesthesia Other Findings   Reproductive/Obstetrics negative OB ROS                              Anesthesia Physical Anesthesia Plan  ASA: 3  Anesthesia Plan: General   Post-op Pain Management: Tylenol PO (pre-op)*, Gabapentin PO (pre-op)*, Ketamine IV* and Toradol IV (intra-op)*   Induction: Intravenous  PONV Risk Score and Plan: 4 or greater and Ondansetron, Dexamethasone, Midazolam, Scopolamine patch - Pre-op and Treatment may vary due to age or medical condition  Airway Management Planned: Oral ETT  Additional Equipment:   Intra-op Plan:   Post-operative Plan: Extubation in OR  Informed Consent: I have reviewed the patients History and Physical, chart, labs and discussed the procedure including the risks, benefits and alternatives for the proposed anesthesia with the patient or authorized representative who has indicated his/her understanding and acceptance.     Dental advisory given  Plan Discussed with: CRNA  Anesthesia Plan Comments: (PAT note by Karoline Caldwell, PA-C:  Recent cardiology evaluation for palpitations.  Seen by Dr. Danelle Berry on 05/31/2022 and event monitor and echo ordered.  Event monitor showed NSR with rare PACs/PVCs, symptoms correlated to PACs.  No sustained arrhythmias.  No atrial fibrillation.  Echo showed normal biventricular function, normal valves.  Cardiology follow-up as needed.  Preop labs reviewed, chronic stable anemia with hemoglobin 10.8, otherwise unremarkable.  EKG 06/25/2022: NSR.  Rate 77.  Event monitor 07/09/2022:   Normal sinus rhythm with rare PACs and rare PVCs.   Symptoms correlated to PACs.   No sustained arrhythmias.   No atrial fibrillation.  TTE 06/28/2022: 1. Left ventricular ejection fraction, by estimation, is 60 to 65%. The  left ventricle has normal function. The left ventricle has no  regional  wall motion abnormalities. Left ventricular diastolic parameters were  normal.  2. Right ventricular systolic function is normal. The right ventricular  size is normal. Tricuspid regurgitation signal is  inadequate for assessing  PA pressure.  3. The mitral valve is normal in structure. No evidence of mitral valve  regurgitation. No evidence of mitral stenosis.  4. The aortic valve is normal in structure. Aortic valve regurgitation is  not visualized. No aortic stenosis is present.  5. The inferior vena cava is normal in size with greater than 50%  respiratory variability, suggesting right atrial pressure of 3 mmHg.   )        Anesthesia Quick Evaluation

## 2022-09-13 NOTE — Patient Instructions (Addendum)
I am not sure the cause of your symptoms. It does not seem nerve related, but I will investigate further with blood work today.  We will taper off Lyrica as it does not seem to be benefiting you:  -Reduce to 25 mg BID today for 1 week, then  -reduce to 25 mg daily for 1 week, then stop  Start Cymbalta 30 mg at bedtime for 1 week, then increase to 60 mg at bedtime thereafter  Return to clinic in 3 months. Please let me know if you have any questions or concerns in the meantime.   The physicians and staff at [facility] are committed to providing excellent care. You may receive a survey requesting feedback about your experience at our office. We strive to receive "very good" responses to the survey questions. If you feel that your experience would prevent you from giving the office a "very good " response, please contact our office to try to remedy the situation. We may be reached at [phone]. Thank you for taking the time out of your busy day to complete the survey.  Kai Levins, MD [facility]

## 2022-09-15 LAB — PARATHYROID HORMONE, INTACT (NO CA): PTH: 44 pg/mL (ref 16–77)

## 2022-09-15 LAB — CALCIUM, IONIZED: Calcium, Ion: 5.1 mg/dL (ref 4.7–5.5)

## 2022-09-16 MED ORDER — GENTAMICIN SULFATE 40 MG/ML IJ SOLN
5.0000 mg/kg | INTRAVENOUS | Status: AC
  Administered 2022-09-17: 500 mg via INTRAVENOUS
  Filled 2022-09-16 (×3): qty 12.5

## 2022-09-16 MED ORDER — CLINDAMYCIN PHOSPHATE 900 MG/50ML IV SOLN
900.0000 mg | INTRAVENOUS | Status: AC
  Administered 2022-09-17: 900 mg via INTRAVENOUS
  Filled 2022-09-16: qty 50

## 2022-09-16 MED ORDER — CLINDAMYCIN PHOSPHATE 900 MG/50ML IV SOLN: 900.0000 mg | INTRAVENOUS | Status: DC

## 2022-09-16 MED ORDER — GENTAMICIN SULFATE 40 MG/ML IJ SOLN: 5.0000 mg/kg | INTRAVENOUS | Status: DC

## 2022-09-17 ENCOUNTER — Observation Stay (HOSPITAL_BASED_OUTPATIENT_CLINIC_OR_DEPARTMENT_OTHER): Payer: Medicaid Other | Admitting: Certified Registered"

## 2022-09-17 ENCOUNTER — Encounter (HOSPITAL_COMMUNITY)
Admission: RE | Disposition: A | Discharge status: Home / Self Care | Payer: Self-pay | Source: Home / Self Care | Attending: Obstetrics and Gynecology

## 2022-09-17 ENCOUNTER — Other Ambulatory Visit: Payer: Self-pay

## 2022-09-17 ENCOUNTER — Observation Stay (HOSPITAL_COMMUNITY)
Admission: RE | Admit: 2022-09-17 | Discharge: 2022-09-18 | Disposition: A | Discharge status: Home / Self Care | Payer: Medicaid Other | Attending: Obstetrics and Gynecology | Admitting: Obstetrics and Gynecology

## 2022-09-17 ENCOUNTER — Encounter (HOSPITAL_COMMUNITY): Payer: Self-pay | Admitting: Obstetrics and Gynecology

## 2022-09-17 ENCOUNTER — Observation Stay (HOSPITAL_COMMUNITY): Payer: Medicaid Other | Admitting: Physician Assistant

## 2022-09-17 DIAGNOSIS — Z8616 Personal history of COVID-19: Secondary | ICD-10-CM | POA: Insufficient documentation

## 2022-09-17 DIAGNOSIS — D251 Intramural leiomyoma of uterus: Secondary | ICD-10-CM

## 2022-09-17 DIAGNOSIS — D630 Anemia in neoplastic disease: Secondary | ICD-10-CM

## 2022-09-17 DIAGNOSIS — N920 Excessive and frequent menstruation with regular cycle: Secondary | ICD-10-CM | POA: Insufficient documentation

## 2022-09-17 DIAGNOSIS — J45909 Unspecified asthma, uncomplicated: Secondary | ICD-10-CM | POA: Insufficient documentation

## 2022-09-17 DIAGNOSIS — D252 Subserosal leiomyoma of uterus: Secondary | ICD-10-CM | POA: Diagnosis not present

## 2022-09-17 DIAGNOSIS — Z87891 Personal history of nicotine dependence: Secondary | ICD-10-CM | POA: Diagnosis not present

## 2022-09-17 DIAGNOSIS — E669 Obesity, unspecified: Secondary | ICD-10-CM | POA: Diagnosis not present

## 2022-09-17 DIAGNOSIS — N840 Polyp of corpus uteri: Secondary | ICD-10-CM

## 2022-09-17 DIAGNOSIS — D259 Leiomyoma of uterus, unspecified: Secondary | ICD-10-CM | POA: Diagnosis not present

## 2022-09-17 DIAGNOSIS — N72 Inflammatory disease of cervix uteri: Secondary | ICD-10-CM | POA: Diagnosis not present

## 2022-09-17 DIAGNOSIS — Z6837 Body mass index (BMI) 37.0-37.9, adult: Secondary | ICD-10-CM | POA: Diagnosis not present

## 2022-09-17 DIAGNOSIS — Z9889 Other specified postprocedural states: Secondary | ICD-10-CM

## 2022-09-17 HISTORY — PX: VAGINAL HYSTERECTOMY: SHX2639

## 2022-09-17 LAB — ANA+ENA+DNA/DS+SCL 70+SJOSSA/B
ANA Titer 1: NEGATIVE
ENA RNP Ab: 0.2 AI (ref 0.0–0.9)
ENA SM Ab Ser-aCnc: <0.2 AI (ref 0.0–0.9)
ENA SSA (RO) Ab: <0.2 AI (ref 0.0–0.9)
ENA SSB (LA) Ab: <0.2 AI (ref 0.0–0.9)
Scleroderma (Scl-70) (ENA) Antibody, IgG: <0.2 AI (ref 0.0–0.9)
dsDNA Ab: <1 IU/mL (ref 0–9)

## 2022-09-17 LAB — TYPE AND SCREEN
ABO/RH(D): B POS
Antibody Screen: NEGATIVE

## 2022-09-17 LAB — POCT PREGNANCY, URINE: Preg Test, Ur: NEGATIVE

## 2022-09-17 SURGERY — HYSTERECTOMY, VAGINAL
Anesthesia: General | Site: Vagina | Laterality: Bilateral

## 2022-09-17 MED ORDER — DEXAMETHASONE SODIUM PHOSPHATE 10 MG/ML IJ SOLN
INTRAMUSCULAR | Status: AC
  Filled 2022-09-17: qty 1

## 2022-09-17 MED ORDER — OXYCODONE-ACETAMINOPHEN 5-325 MG PO TABS
0.5000 | ORAL_TABLET | ORAL | Status: DC | PRN
  Administered 2022-09-17 (×2): 0.5 via ORAL
  Administered 2022-09-18 (×2): 1 via ORAL
  Filled 2022-09-17 (×4): qty 1

## 2022-09-17 MED ORDER — ONDANSETRON HCL 4 MG PO TABS: 4.0000 mg | ORAL_TABLET | Freq: Four times a day (QID) | ORAL | Status: DC | PRN

## 2022-09-17 MED ORDER — PHENYLEPHRINE 80 MCG/ML (10ML) SYRINGE FOR IV PUSH (FOR BLOOD PRESSURE SUPPORT)
PREFILLED_SYRINGE | INTRAVENOUS | Status: AC
  Filled 2022-09-17: qty 10

## 2022-09-17 MED ORDER — FENTANYL CITRATE (PF) 250 MCG/5ML IJ SOLN
INTRAMUSCULAR | Status: DC | PRN
  Administered 2022-09-17: 100 ug via INTRAVENOUS
  Administered 2022-09-17: 50 ug via INTRAVENOUS

## 2022-09-17 MED ORDER — SENNA 8.6 MG PO TABS
1.0000 | ORAL_TABLET | Freq: Two times a day (BID) | ORAL | Status: DC
  Administered 2022-09-17 (×2): 8.6 mg via ORAL
  Filled 2022-09-17 (×2): qty 1

## 2022-09-17 MED ORDER — HYDROMORPHONE HCL 1 MG/ML IJ SOLN
INTRAMUSCULAR | Status: AC
  Filled 2022-09-17: qty 0.5

## 2022-09-17 MED ORDER — PROPOFOL 10 MG/ML IV BOLUS
INTRAVENOUS | Status: AC
  Filled 2022-09-17: qty 20

## 2022-09-17 MED ORDER — SUGAMMADEX SODIUM 200 MG/2ML IV SOLN
INTRAVENOUS | Status: DC | PRN
  Administered 2022-09-17: 200 mg via INTRAVENOUS

## 2022-09-17 MED ORDER — OXYCODONE HCL 5 MG/5ML PO SOLN: 5.0000 mg | Freq: Once | ORAL | Status: DC | PRN

## 2022-09-17 MED ORDER — LACTATED RINGERS IV SOLN: INTRAVENOUS | Status: DC

## 2022-09-17 MED ORDER — AMISULPRIDE (ANTIEMETIC) 5 MG/2ML IV SOLN: 10.0000 mg | Freq: Once | INTRAVENOUS | Status: DC | PRN

## 2022-09-17 MED ORDER — ACETAMINOPHEN 500 MG PO TABS
ORAL_TABLET | ORAL | Status: AC
  Administered 2022-09-17: 1000 mg via ORAL
  Filled 2022-09-17: qty 2

## 2022-09-17 MED ORDER — 0.9 % SODIUM CHLORIDE (POUR BTL) OPTIME
TOPICAL | Status: DC | PRN
  Administered 2022-09-17: 1000 mL

## 2022-09-17 MED ORDER — EPHEDRINE 5 MG/ML INJ
INTRAVENOUS | Status: AC
  Filled 2022-09-17: qty 5

## 2022-09-17 MED ORDER — OXYCODONE-ACETAMINOPHEN 5-325 MG PO TABS: 2.0000 | ORAL_TABLET | ORAL | Status: DC | PRN

## 2022-09-17 MED ORDER — MEPERIDINE HCL 25 MG/ML IJ SOLN: 6.2500 mg | INTRAMUSCULAR | Status: DC | PRN

## 2022-09-17 MED ORDER — CHLORHEXIDINE GLUCONATE 0.12 % MT SOLN
15.0000 mL | Freq: Once | OROMUCOSAL | Status: AC
  Administered 2022-09-17: 15 mL via OROMUCOSAL

## 2022-09-17 MED ORDER — ONDANSETRON HCL 4 MG/2ML IJ SOLN
INTRAMUSCULAR | Status: AC
  Filled 2022-09-17: qty 2

## 2022-09-17 MED ORDER — PANTOPRAZOLE SODIUM 20 MG PO TBEC
40.0000 mg | DELAYED_RELEASE_TABLET | Freq: Every morning | ORAL | Status: DC
  Administered 2022-09-17 – 2022-09-18 (×2): 40 mg via ORAL
  Filled 2022-09-17 (×3): qty 2

## 2022-09-17 MED ORDER — ONDANSETRON HCL 4 MG/2ML IJ SOLN
4.0000 mg | Freq: Four times a day (QID) | INTRAMUSCULAR | Status: DC | PRN
  Administered 2022-09-17: 4 mg via INTRAVENOUS
  Filled 2022-09-17: qty 2

## 2022-09-17 MED ORDER — SIMETHICONE 80 MG PO CHEW: 80.0000 mg | CHEWABLE_TABLET | Freq: Four times a day (QID) | ORAL | Status: DC | PRN

## 2022-09-17 MED ORDER — PROMETHAZINE HCL 25 MG/ML IJ SOLN: 6.2500 mg | INTRAMUSCULAR | Status: DC | PRN

## 2022-09-17 MED ORDER — HYDROMORPHONE HCL 1 MG/ML IJ SOLN
0.2500 mg | INTRAMUSCULAR | Status: DC | PRN
  Administered 2022-09-17 (×3): 0.5 mg via INTRAVENOUS

## 2022-09-17 MED ORDER — LIDOCAINE 2% (20 MG/ML) 5 ML SYRINGE
INTRAMUSCULAR | Status: DC | PRN
  Administered 2022-09-17: 100 mg via INTRAVENOUS

## 2022-09-17 MED ORDER — HYDROMORPHONE HCL 1 MG/ML IJ SOLN
INTRAMUSCULAR | Status: DC | PRN
  Administered 2022-09-17: .5 mg via INTRAVENOUS

## 2022-09-17 MED ORDER — ROCURONIUM BROMIDE 10 MG/ML (PF) SYRINGE
PREFILLED_SYRINGE | INTRAVENOUS | Status: DC | PRN
  Administered 2022-09-17: 60 mg via INTRAVENOUS

## 2022-09-17 MED ORDER — KETAMINE HCL 10 MG/ML IJ SOLN
INTRAMUSCULAR | Status: DC | PRN
  Administered 2022-09-17: 50 mg via INTRAVENOUS

## 2022-09-17 MED ORDER — DULOXETINE HCL 60 MG PO CPEP
60.0000 mg | ORAL_CAPSULE | Freq: Every day | ORAL | Status: DC
  Filled 2022-09-17: qty 1
  Filled 2022-09-17: qty 3

## 2022-09-17 MED ORDER — SCOPOLAMINE 1 MG/3DAYS TD PT72
1.0000 | MEDICATED_PATCH | TRANSDERMAL | Status: DC
  Administered 2022-09-17: 1.5 mg via TRANSDERMAL
  Filled 2022-09-17: qty 1

## 2022-09-17 MED ORDER — HYDROMORPHONE HCL 1 MG/ML IJ SOLN: 1.0000 mg | INTRAMUSCULAR | Status: DC | PRN

## 2022-09-17 MED ORDER — POVIDONE-IODINE 10 % EX SWAB
2.0000 | Freq: Once | CUTANEOUS | Status: AC
  Administered 2022-09-17: 2 via TOPICAL

## 2022-09-17 MED ORDER — ACETAMINOPHEN 500 MG PO TABS
1000.0000 mg | ORAL_TABLET | ORAL | Status: AC
  Filled 2022-09-17: qty 2

## 2022-09-17 MED ORDER — PROPOFOL 500 MG/50ML IV EMUL
INTRAVENOUS | Status: DC | PRN
  Administered 2022-09-17: 25 ug/kg/min via INTRAVENOUS

## 2022-09-17 MED ORDER — PROPOFOL 1000 MG/100ML IV EMUL
INTRAVENOUS | Status: AC
  Filled 2022-09-17: qty 100

## 2022-09-17 MED ORDER — KETOROLAC TROMETHAMINE 15 MG/ML IJ SOLN
15.0000 mg | INTRAMUSCULAR | Status: AC
  Administered 2022-09-17: 15 mg via INTRAVENOUS
  Filled 2022-09-17: qty 1

## 2022-09-17 MED ORDER — ONDANSETRON HCL 4 MG/2ML IJ SOLN
INTRAMUSCULAR | Status: DC | PRN
  Administered 2022-09-17: 4 mg via INTRAVENOUS

## 2022-09-17 MED ORDER — HYDROMORPHONE HCL 1 MG/ML IJ SOLN
INTRAMUSCULAR | Status: AC
  Filled 2022-09-17: qty 1

## 2022-09-17 MED ORDER — MIDAZOLAM HCL 2 MG/2ML IJ SOLN
INTRAMUSCULAR | Status: AC
  Filled 2022-09-17: qty 2

## 2022-09-17 MED ORDER — DEXAMETHASONE SODIUM PHOSPHATE 10 MG/ML IJ SOLN
INTRAMUSCULAR | Status: DC | PRN
  Administered 2022-09-17: 10 mg via INTRAVENOUS

## 2022-09-17 MED ORDER — PROPOFOL 1000 MG/100ML IV EMUL
INTRAVENOUS | Status: AC
  Filled 2022-09-17: qty 300

## 2022-09-17 MED ORDER — PHENYLEPHRINE 80 MCG/ML (10ML) SYRINGE FOR IV PUSH (FOR BLOOD PRESSURE SUPPORT)
PREFILLED_SYRINGE | INTRAVENOUS | Status: DC | PRN
  Administered 2022-09-17: 80 ug via INTRAVENOUS
  Administered 2022-09-17: 160 ug via INTRAVENOUS
  Administered 2022-09-17 (×2): 80 ug via INTRAVENOUS
  Administered 2022-09-17: 160 ug via INTRAVENOUS

## 2022-09-17 MED ORDER — KETAMINE HCL 50 MG/5ML IJ SOSY
PREFILLED_SYRINGE | INTRAMUSCULAR | Status: AC
  Filled 2022-09-17: qty 5

## 2022-09-17 MED ORDER — PHENYLEPHRINE HCL-NACL 20-0.9 MG/250ML-% IV SOLN
INTRAVENOUS | Status: AC
  Filled 2022-09-17: qty 250

## 2022-09-17 MED ORDER — KETOROLAC TROMETHAMINE 30 MG/ML IJ SOLN
30.0000 mg | Freq: Four times a day (QID) | INTRAMUSCULAR | Status: AC
  Administered 2022-09-17 – 2022-09-18 (×4): 30 mg via INTRAVENOUS
  Filled 2022-09-17 (×4): qty 1

## 2022-09-17 MED ORDER — FENTANYL CITRATE (PF) 250 MCG/5ML IJ SOLN
INTRAMUSCULAR | Status: AC
  Filled 2022-09-17: qty 5

## 2022-09-17 MED ORDER — OXYCODONE-ACETAMINOPHEN 5-325 MG PO TABS
1.0000 | ORAL_TABLET | ORAL | Status: DC | PRN
  Filled 2022-09-17: qty 1

## 2022-09-17 MED ORDER — ACETAMINOPHEN 500 MG PO TABS: 1000.0000 mg | ORAL_TABLET | Freq: Once | ORAL | Status: DC

## 2022-09-17 MED ORDER — IBUPROFEN 800 MG PO TABS: 800.0000 mg | ORAL_TABLET | Freq: Three times a day (TID) | ORAL | Status: DC

## 2022-09-17 MED ORDER — ACETAMINOPHEN 325 MG PO TABS
650.0000 mg | ORAL_TABLET | ORAL | Status: DC | PRN
  Administered 2022-09-17 (×3): 650 mg via ORAL
  Filled 2022-09-17 (×3): qty 2

## 2022-09-17 MED ORDER — SOD CITRATE-CITRIC ACID 500-334 MG/5ML PO SOLN
30.0000 mL | ORAL | Status: AC
  Administered 2022-09-17: 30 mL via ORAL
  Filled 2022-09-17: qty 30

## 2022-09-17 MED ORDER — ZOLPIDEM TARTRATE 5 MG PO TABS: 5.0000 mg | ORAL_TABLET | Freq: Every evening | ORAL | Status: DC | PRN

## 2022-09-17 MED ORDER — ROCURONIUM BROMIDE 10 MG/ML (PF) SYRINGE
PREFILLED_SYRINGE | INTRAVENOUS | Status: AC
  Filled 2022-09-17: qty 10

## 2022-09-17 MED ORDER — KETOROLAC TROMETHAMINE 15 MG/ML IJ SOLN
INTRAMUSCULAR | Status: DC | PRN
  Administered 2022-09-17: 15 mg via INTRAVENOUS

## 2022-09-17 MED ORDER — PROPOFOL 10 MG/ML IV BOLUS
INTRAVENOUS | Status: DC | PRN
  Administered 2022-09-17: 200 mg via INTRAVENOUS

## 2022-09-17 MED ORDER — OXYCODONE HCL 5 MG PO TABS: 5.0000 mg | ORAL_TABLET | Freq: Once | ORAL | Status: DC | PRN

## 2022-09-17 MED ORDER — LIDOCAINE 2% (20 MG/ML) 5 ML SYRINGE
INTRAMUSCULAR | Status: AC
  Filled 2022-09-17: qty 5

## 2022-09-17 SURGICAL SUPPLY — 29 items
BAG COUNTER SPONGE SURGICOUNT (BAG) ×1 IMPLANT
BAG SPNG CNTER NS LX DISP (BAG) ×1
BLADE SURG 10 STRL SS (BLADE) ×1 IMPLANT
DRAPE SURG IRRIG POUCH 19X23 (DRAPES) IMPLANT
GAUZE 4X4 16PLY ~~LOC~~+RFID DBL (SPONGE) ×2 IMPLANT
GLOVE BIO SURGEON STRL SZ7.5 (GLOVE) ×1 IMPLANT
GLOVE BIOGEL PI IND STRL 6.5 (GLOVE) IMPLANT
GLOVE BIOGEL PI IND STRL 7.5 (GLOVE) IMPLANT
GLOVE SKINSENSE STRL SZ7.0 (GLOVE) IMPLANT
GLOVE SURG SS PI 7.0 STRL IVOR (GLOVE) IMPLANT
GLOVE SURG SS PI 7.5 STRL IVOR (GLOVE) IMPLANT
GOWN STRL REUS W/ TWL LRG LVL3 (GOWN DISPOSABLE) ×2 IMPLANT
GOWN STRL REUS W/ TWL XL LVL3 (GOWN DISPOSABLE) ×2 IMPLANT
GOWN STRL REUS W/TWL LRG LVL3 (GOWN DISPOSABLE) ×1
GOWN STRL REUS W/TWL XL LVL3 (GOWN DISPOSABLE) ×3
HIBICLENS CHG 4% 4OZ BTL (MISCELLANEOUS) ×1 IMPLANT
KIT TURNOVER KIT B (KITS) ×1 IMPLANT
NS IRRIG 1000ML POUR BTL (IV SOLUTION) ×1 IMPLANT
PACK VAGINAL WOMENS (CUSTOM PROCEDURE TRAY) ×1 IMPLANT
PAD OB MATERNITY 4.3X12.25 (PERSONAL CARE ITEMS) ×1 IMPLANT
SOL PREP POV-IOD 4OZ 10% (MISCELLANEOUS) IMPLANT
SUT VIC AB 2-0 CT1 18 (SUTURE) ×1 IMPLANT
SUT VIC AB 2-0 CT1 27 (SUTURE) ×1
SUT VIC AB 2-0 CT1 TAPERPNT 27 (SUTURE) ×1 IMPLANT
SUT VIC AB PLUS 45CM 1-MO-4 (SUTURE) ×2 IMPLANT
SUT VICRYL 1 TIES 12X18 (SUTURE) ×1 IMPLANT
TOWEL GREEN STERILE FF (TOWEL DISPOSABLE) ×1 IMPLANT
TRAY FOLEY W/BAG SLVR 14FR (SET/KITS/TRAYS/PACK) ×1 IMPLANT
UNDERPAD 30X36 HEAVY ABSORB (UNDERPADS AND DIAPERS) ×1 IMPLANT

## 2022-09-17 NOTE — Op Note (Signed)
Breanna Miranda PROCEDURE DATE: 09/17/2022  PREOPERATIVE DIAGNOSIS:  Symptomatic fibroids, menorrhagia POSTOPERATIVE DIAGNOSIS:  Symptomatic fibroids, menorrhagia SURGEON:   Arlina Robes, MD ASSISTANT: Aletha Halim, M.D.  An experienced assistant was required given the standard of surgical care given the complexity of the case.  This assistant was needed for exposure, dissection, suctioning, retraction, and for overall help during the procedure.   OPERATION:  Total Vaginal hysterectomy and bilateral salpingectomy ANESTHESIA:  General endotracheal.  INDICATIONS: The patient is a 37 y.o. [id number] with history of symptomatic uterine fibroids/menorrhagia. The patient made a decision to undergo definite surgical treatment. On the preoperative visit, the risks, benefits, indications, and alternatives of the procedure were reviewed with the patient.  On the day of surgery, the risks of surgery were again discussed with the patient including but not limited to: bleeding which may require transfusion or reoperation; infection which may require antibiotics; injury to bowel, bladder, ureters or other surrounding organs; need for additional procedures; thromboembolic phenomenon, incisional problems and other postoperative/anesthesia complications. Written informed consent was obtained.    OPERATIVE FINDINGS: A 14 week size uterus with several large fibroids normal tubes and ovaries bilaterally. Thicken endometrium with possible polyps  ESTIMATED BLOOD LOSS: 125 ml FLUIDS:  As recorded URINE OUTPUT:  As recorded SPECIMENS:  Uterus and cervix, tubes and endometrial poylp sent to pathology COMPLICATIONS:  None immediate.  DESCRIPTION OF PROCEDURE:  The patient received intravenous antibiotics and had sequential compression devices applied to her lower extremities while in the preoperative area.  She was then taken to the operating room where general anesthesia was administered and was found to be  adequate.  She was placed in the dorsal lithotomy position, and was prepped and draped in a sterile manner.  A Foley catheter was inserted into her bladder and attached to constant drainage. After an adequate timeout was performed, attention was turned to her pelvis.  A weighted speculum was then placed in the vagina, and the posterior lip of the cervix was grasped  with tenaculum. The posterior cul de sac was then entered sharply without problems. Long bill weight speculum was placed.  The anterior lip of the cervix was grasped with a tenaculum.  The cervix was then circumferentially incised, and the bladder was dissected off the pubocervical fascia anteriorly without complication.  The anterior cul-de-sac was then entered sharply without difficulty and a retractor was placed.  Zeplin  clamps were then used to clamp the uterosacral ligaments on either side.  They were then cut and sutured ligated with 0 Vicryl.  Of note, all sutures used in this case were 0 Vicryl unless otherwise noted.   The cardinal ligaments were then clamped, cut and ligated. The uterine vessels and broad ligaments were then serially clamped with the Zeplin clamps, cut, and suture ligated on both sides.  Excellent hemostasis was noted at this point. Due to bulkiness of the uterus, it was morcellated using a bivalve technique.  The uterus was then delivered via the posterior cul-de-sac, and the cornua were clamped with Zeplin clamps, transected, and the uterus was delivered and sent to pathology. These pedicles were then suture ligated with a free tie and then in a [place] fashion to ensure hemostasis.  Each tube was grasped and clamped, cut and ligated with 0 Vicryl. After completion of the hysterectomy, all pedicles from the uterosacral ligament to the cornua were noted be to hemostatic. The peritoneal was the closed with 2/0 Vicryl in a purse string fashion.  The vaginal cuff  was then closed with 2/0 Vicryl. in a running locked fashion with  care given to incorporate the uterosacral pedicles bilaterally.  All instruments were then removed from the pelvis. The patient tolerated the procedure well.  All instruments, needles, and sponge counts were correct x 2. The patient was taken to the recovery room in stable condition.    Arlina Robes, MD, [place] Attending [place], [facility]

## 2022-09-17 NOTE — Anesthesia Procedure Notes (Signed)
Procedure Name: Intubation Date/Time: 09/17/2022 7:35 AM  Performed by: Reggie Pile, CRNAPre-anesthesia Checklist: Patient identified, Emergency Drugs available, Suction available and Patient being monitored Patient Re-evaluated:Patient Re-evaluated prior to induction Oxygen Delivery Method: Circle system utilized Preoxygenation: Pre-oxygenation with 100% oxygen Induction Type: IV induction Ventilation: Mask ventilation without difficulty Laryngoscope Size: Miller and 3 Grade View: Grade I Tube type: Oral Tube size: 7.0 mm Number of attempts: 1 Airway Equipment and Method: Stylet and Oral airway Placement Confirmation: ETT inserted through vocal cords under direct vision, positive ETCO2 and breath sounds checked- equal and bilateral Secured at: 22 cm Tube secured with: Tape Dental Injury: Teeth and Oropharynx as per pre-operative assessment

## 2022-09-17 NOTE — Interval H&P Note (Signed)
History and Physical Interval Note:  09/17/2022 7:17 AM  Breanna Miranda  has presented today for surgery, with the diagnosis of Menorrhagia Fibroids.  The various methods of treatment have been discussed with the patient and family. After consideration of risks, benefits and other options for treatment, the patient has consented to  Procedure(s): HYSTERECTOMY VAGINAL WITH SALPINGECTOMY (Bilateral) as a surgical intervention.  The patient's history has been reviewed, patient examined, no change in status, stable for surgery.  I have reviewed the patient's chart and labs.  Questions were answered to the patient's satisfaction.     Chancy Milroy

## 2022-09-17 NOTE — Transfer of Care (Signed)
Immediate Anesthesia Transfer of Care Note  Patient: Tanisia L Frentz  Procedure(s) Performed: VAGINAL HYSTERECTOMY WITH BILATERAL SALPINGECTOMY (Bilateral: Vagina )  Patient Location: PACU  Anesthesia Type:General  Level of Consciousness: awake and alert   Airway & Oxygen Therapy: Patient Spontanous Breathing and Patient connected to nasal cannula oxygen  Post-op Assessment: Report given to RN and Post -op Vital signs reviewed and stable  Post vital signs: Reviewed and stable  Last Vitals:  Vitals Value Taken Time  BP 111/77 09/17/22 0947  Temp    Pulse 85 09/17/22 0954  Resp 15 09/17/22 0954  SpO2 96 % 09/17/22 0954  Vitals shown include unvalidated device data.  Last Pain:  Vitals:   09/17/22 0620  TempSrc:   PainSc: 7       Patients Stated Pain Goal: 4 ([id number])  Complications: No notable events documented.

## 2022-09-17 NOTE — Anesthesia Postprocedure Evaluation (Signed)
Anesthesia Post Note  Patient: Breanna Miranda  Procedure(s) Performed: VAGINAL HYSTERECTOMY WITH BILATERAL SALPINGECTOMY (Bilateral: Vagina )     Patient location during evaluation: PACU Anesthesia Type: General Level of consciousness: sedated and patient cooperative Pain management: pain level controlled Vital Signs Assessment: post-procedure vital signs reviewed and stable Respiratory status: spontaneous breathing Cardiovascular status: stable Anesthetic complications: no   No notable events documented.  Last Vitals:  Vitals:   09/17/22 1109 09/17/22 1255  BP: 109/70 109/71  Pulse: 72 78  Resp: 16 16  Temp: 36.8 C 36.8 C  SpO2: 100% 100%    Last Pain:  Vitals:   09/17/22 1258  TempSrc:   PainSc: [place]

## 2022-09-18 ENCOUNTER — Encounter: Payer: Self-pay | Admitting: Neurology

## 2022-09-18 ENCOUNTER — Encounter (HOSPITAL_COMMUNITY): Payer: Self-pay | Admitting: Obstetrics and Gynecology

## 2022-09-18 DIAGNOSIS — D259 Leiomyoma of uterus, unspecified: Secondary | ICD-10-CM | POA: Diagnosis not present

## 2022-09-18 LAB — BASIC METABOLIC PANEL
Anion gap: 8 (ref 5–15)
BUN: 11 mg/dL (ref 6–20)
CO2: 25 mmol/L (ref 22–32)
Calcium: 8.7 mg/dL — ABNORMAL LOW (ref 8.9–10.3)
Chloride: 104 mmol/L (ref 98–111)
Creatinine, Ser: 0.86 mg/dL (ref 0.44–1.00)
GFR, Estimated: >60 mL/min (ref >60)
Glucose, Bld: 105 mg/dL — ABNORMAL HIGH (ref 70–99)
Potassium: 4 mmol/L (ref 3.5–5.1)
Sodium: 137 mmol/L (ref 135–145)

## 2022-09-18 LAB — CBC
HCT: 30.7 % — ABNORMAL LOW (ref 36.0–46.0)
Hemoglobin: 9.7 g/dL — ABNORMAL LOW (ref 12.0–15.0)
MCH: 20.3 pg — ABNORMAL LOW (ref 26.0–34.0)
MCHC: 31.6 g/dL (ref 30.0–36.0)
MCV: 64.4 fL — ABNORMAL LOW (ref 80.0–100.0)
Platelets: 256 10*3/uL (ref 150–400)
RBC: 4.77 MIL/uL (ref 3.87–5.11)
RDW: 20.3 % — ABNORMAL HIGH (ref 11.5–15.5)
WBC: 11.3 10*3/uL — ABNORMAL HIGH (ref 4.0–10.5)
nRBC: 0 % (ref 0.0–0.2)

## 2022-09-18 MED ORDER — OXYCODONE-ACETAMINOPHEN 5-325 MG PO TABS: 0.5000 | ORAL_TABLET | ORAL | 0 refills | Status: DC | PRN

## 2022-09-18 MED ORDER — IBUPROFEN 800 MG PO TABS: 800.0000 mg | ORAL_TABLET | Freq: Three times a day (TID) | ORAL | 1 refills | Status: DC

## 2022-09-18 NOTE — Discharge Summary (Signed)
Physician Discharge Summary  Patient ID: Breanna Miranda MRN: [id number] DOB/AGE: Nov 21, 1985 37 y.o.  Admit date: 09/17/2022 Discharge date: 09/18/2022  Admission Diagnoses: Uterine Fibroids  Discharge Diagnoses:  Principal Problem:   Post-operative state   Discharged Condition: good  Hospital Course: Breanna Miranda was admitted with above Dx. See admit H & P for additional information. She underwent TVH with bilateral salpingectomy on 09/17/22 without problems. See OP note for additional information. Pt's post op course was unremarkable. She progressed to ambulating, voiding, + flatus, tolerating diet and good oral pain control. Felt amendable for discharge home. Discharge instructions, medications and follow up reviewed with pt. Pt verbalized understanding  Consults: None  Significant Diagnostic Studies: labs  Treatments: surgery: TVH/BS  Discharge Exam: Blood pressure (!) 107/59, pulse 63, temperature 98.1 F (36.7 C), temperature source Oral, resp. rate 16, height 5\' 4"  (1.626 m), weight 99.8 kg, last menstrual period 06/26/2022, SpO2 100 %. Lungs clear Heart RRR Abd soft + BS GU no bleeding Ext non tender  Disposition: Discharge disposition: 01-Home or Self Care       Discharge Instructions     Call MD for:  difficulty breathing, headache or visual disturbances   Complete by: As directed    Call MD for:  extreme fatigue   Complete by: As directed    Call MD for:  hives   Complete by: As directed    Call MD for:  persistant dizziness or light-headedness   Complete by: As directed    Call MD for:  persistant nausea and vomiting   Complete by: As directed    Call MD for:  redness, tenderness, or signs of infection (pain, swelling, redness, odor or green/yellow discharge around incision site)   Complete by: As directed    Call MD for:  severe uncontrolled pain   Complete by: As directed    Call MD for:  temperature >100.4   Complete by: As directed    Diet - low  sodium heart healthy   Complete by: As directed    Increase activity slowly   Complete by: As directed    Sexual Activity Restrictions   Complete by: As directed    Pelvic rest x 4 weeks      Allergies as of 09/18/2022       Reactions   Penicillins Anaphylaxis, Hives, Itching   Has patient had a PCN reaction causing immediate rash, facial/tongue/throat swelling, SOB or lightheadedness with hypotension: Yes Has patient had a PCN reaction causing severe rash involving mucus membranes or skin necrosis: No Has patient had a PCN reaction that required hospitalization No Has patient had a PCN reaction occurring within the last 10 years: Yes If all of the above answers are "NO", then may proceed with Cephalosporin use.   Shrimp [shellfish Allergy] Anaphylaxis, Hives, Itching        Medication List     STOP taking these medications    megestrol 40 MG tablet Commonly known as: MEGACE       TAKE these medications    acetaminophen 500 MG tablet Commonly known as: TYLENOL Take 1,000 mg by mouth every 6 (six) hours as needed for mild pain.   diclofenac Sodium 1 % Gel Commonly known as: VOLTAREN Apply 1 Application topically 2 (two) times daily as needed (pain).   DULoxetine 30 MG capsule Commonly known as: Cymbalta Take 2 capsules (60 mg total) by mouth at bedtime. Take 1 capsule (30 mg) at bedtime for 1 week, then increase to  2 capsules (60 mg) thereafter.   ferrous sulfate 325 (65 FE) MG tablet Take 325 mg by mouth daily with breakfast.   fluticasone 50 MCG/ACT nasal spray Commonly known as: FLONASE SHAKE LIQUID AND USE 2 SPRAYS IN EACH NOSTRIL DAILY What changed: See the new instructions.   ibuprofen 800 MG tablet Commonly known as: ADVIL Take 1 tablet (800 mg total) by mouth every 8 (eight) hours.   oxyCODONE-acetaminophen 5-325 MG tablet Commonly known as: PERCOCET/ROXICET Take 0.5-1 tablets by mouth every 4 (four) hours as needed for moderate pain.    pantoprazole 40 MG tablet Commonly known as: PROTONIX TAKE 1 TABLET(40 MG) BY MOUTH DAILY What changed: See the new instructions.   VITAMIN B-12 PO Take by mouth.   ZINC PO Take 1 tablet by mouth in the morning.        Follow-up Information     [facility]. Schedule an appointment as soon as possible for a visit in 4 week(s).   Why: Face to face post op appt with Dr. Gardiner Fanti Contact information: [address] [place] [phone]                Signed: Chancy Milroy 09/18/2022, 7:40 AM

## 2022-09-18 NOTE — Progress Notes (Signed)
Discharge instructions and prescriptions given to pt. Discussed post surgical instructions, signs and symptoms to report to the MD, upcoming appointments, and meds. Pt verbalizes understanding and has no questions or concerns at this time. Pt discharged home from hospital in stable condition.

## 2022-09-19 LAB — SURGICAL PATHOLOGY

## 2022-09-24 ENCOUNTER — Encounter: Payer: Self-pay | Admitting: Obstetrics and Gynecology

## 2022-10-08 ENCOUNTER — Other Ambulatory Visit: Payer: Self-pay | Admitting: Family Medicine

## 2022-10-08 ENCOUNTER — Encounter: Payer: Self-pay | Admitting: Student

## 2022-10-08 DIAGNOSIS — E559 Vitamin D deficiency, unspecified: Secondary | ICD-10-CM | POA: Diagnosis not present

## 2022-10-08 DIAGNOSIS — K219 Gastro-esophageal reflux disease without esophagitis: Secondary | ICD-10-CM

## 2022-10-08 DIAGNOSIS — E7800 Pure hypercholesterolemia, unspecified: Secondary | ICD-10-CM | POA: Diagnosis not present

## 2022-10-08 DIAGNOSIS — N914 Secondary oligomenorrhea: Secondary | ICD-10-CM | POA: Diagnosis not present

## 2022-10-08 DIAGNOSIS — R0602 Shortness of breath: Secondary | ICD-10-CM | POA: Diagnosis not present

## 2022-10-08 DIAGNOSIS — E88819 Insulin resistance, unspecified: Secondary | ICD-10-CM | POA: Diagnosis not present

## 2022-10-08 DIAGNOSIS — R5383 Other fatigue: Secondary | ICD-10-CM | POA: Diagnosis not present

## 2022-10-08 DIAGNOSIS — D539 Nutritional anemia, unspecified: Secondary | ICD-10-CM | POA: Diagnosis not present

## 2022-10-08 DIAGNOSIS — E669 Obesity, unspecified: Secondary | ICD-10-CM | POA: Diagnosis not present

## 2022-10-08 DIAGNOSIS — L509 Urticaria, unspecified: Secondary | ICD-10-CM

## 2022-10-08 DIAGNOSIS — Z131 Encounter for screening for diabetes mellitus: Secondary | ICD-10-CM | POA: Diagnosis not present

## 2022-10-08 DIAGNOSIS — Z79899 Other long term (current) drug therapy: Secondary | ICD-10-CM | POA: Diagnosis not present

## 2022-10-08 MED ORDER — PANTOPRAZOLE SODIUM 40 MG PO TBEC: DELAYED_RELEASE_TABLET | ORAL | 0 refills | Status: DC

## 2022-10-17 ENCOUNTER — Encounter: Payer: Self-pay | Admitting: Obstetrics and Gynecology

## 2022-10-17 ENCOUNTER — Ambulatory Visit (INDEPENDENT_AMBULATORY_CARE_PROVIDER_SITE_OTHER): Payer: Medicaid Other | Admitting: Obstetrics and Gynecology

## 2022-10-17 VITALS — BP 105/69 | HR 76 | Ht 64.0 in | Wt 223.0 lb

## 2022-10-17 DIAGNOSIS — Z90710 Acquired absence of both cervix and uterus: Secondary | ICD-10-CM | POA: Insufficient documentation

## 2022-10-17 DIAGNOSIS — Z9079 Acquired absence of other genital organ(s): Secondary | ICD-10-CM | POA: Insufficient documentation

## 2022-10-17 DIAGNOSIS — Z9889 Other specified postprocedural states: Secondary | ICD-10-CM

## 2022-10-17 NOTE — Progress Notes (Signed)
37 y.o GYN presents for Post Op FU of Hysterectomy.  C/o pain 7/10.

## 2022-10-17 NOTE — Progress Notes (Signed)
Breanna Miranda presents for post op appt. S/P TVH with BS on 3/24. Pathology reviewed with pt Pt reports doing well. Some soreness Tolerating diet Denies any bowel or bladder dysfunction  PE AF VSS Chaperone present  Lungs clear Heart RRR Abd soft + BS GU Nl EGBUS, cuff healing well, no masses  A/P S/P TVH/BS  Doing well. Continue with pelvic rest x 2 more weeks. O/W in ADL's as tolerates Return to work note for Monday without restrictions provided F/U PRN

## 2022-10-25 ENCOUNTER — Ambulatory Visit (INDEPENDENT_AMBULATORY_CARE_PROVIDER_SITE_OTHER): Payer: Medicaid Other | Admitting: Family Medicine

## 2022-10-25 ENCOUNTER — Other Ambulatory Visit: Payer: Self-pay

## 2022-10-25 VITALS — BP 113/64 | HR 72 | Wt 219.8 lb

## 2022-10-25 DIAGNOSIS — M7632 Iliotibial band syndrome, left leg: Secondary | ICD-10-CM | POA: Diagnosis not present

## 2022-10-25 DIAGNOSIS — N644 Mastodynia: Secondary | ICD-10-CM

## 2022-10-25 DIAGNOSIS — Z9889 Other specified postprocedural states: Secondary | ICD-10-CM

## 2022-10-25 MED ORDER — IBUPROFEN 800 MG PO TABS: 800.0000 mg | ORAL_TABLET | Freq: Three times a day (TID) | ORAL | 0 refills | Status: DC

## 2022-10-25 MED ORDER — MELOXICAM 15 MG PO TABS: 15.0000 mg | ORAL_TABLET | Freq: Every day | ORAL | 0 refills | Status: DC

## 2022-10-25 NOTE — Assessment & Plan Note (Signed)
Trial of mobic 15 mg daily and IT band exercises given Return in 2-4 weeks if not improved

## 2022-10-25 NOTE — Progress Notes (Signed)
    SUBJECTIVE:   CHIEF COMPLAINT / HPI:   Breast pain left- x 2 -3 weeks. No family history of breast cancer. Sometimes sees a bruise there. Has had a biopsy in 2022 at similar site and everything was normal. Pain in her RUQ of left breast and around nipple. NO trauma there. No nipple discharge. Has been there for months, worse last 2-3 weeks. Does not seem to be associated with menses but also recently had hysterectomy. No past surgeries to her breasts. No chest pain or other associated symptoms.  Left leg pain- more at night. On left side. Has tried tylenol and ibuprofen over the counter and doesn't help. No trauma or fall. Starts on side of hip and goes down to knee on left. Props her leg on a pillow at night, bothers her most at night. No tingling, weakness, lumbar back pain, bowel or bladder incontinence, saddle paresthesias.   PERTINENT  PMH / PSH: GERD, anemia  OBJECTIVE:   BP 113/64   Pulse 72   Wt 219 lb 12.8 oz (99.7 kg)   SpO2 100%   BMI 37.73 kg/m   General: alert & oriented, no apparent distress, well groomed HEENT: normocephalic, atraumatic, EOM grossly intact, oral mucosa moist, neck supple Respiratory: normal respiratory effort GI: non-distended Extremities: left IT band with TTP down entire IT band, no knee tenderness to palpation on left, negative hip log roll on left, no lumber spine or lumbar musculature TTP,  no peripheral edema. Neuro: Normal gait, strength 5/5 in bilateral LE, sensation to light touch intact on LLE, L knee nontender to palpation. moves all four extremities appropriately. Psych: Appropriate mood and affect  Breast: Left breast without lesions, ecchymoses, erythema, or induration. TTP in RUQ from 9 oclock to LUQ at 3 o'clock, no masses palpable. [place] TTP without lesions or masses appreciated. Nipple wihtout discharge. Small hyperpigmented scar at 10 oclock from previous breast biopsy, pt states has been there since then. Right breast without  tenderness, masses, or skin changes.  Dallie Dad CMA present as chaperone for breast exam.   ASSESSMENT/PLAN:   Breast pain, left On exam, tenderness to palpation without masses, skin changes, or nipple discharge Ordered diagnostic mammogram + [place] and scheduled today  It band syndrome, left Trial of mobic 15 mg daily and IT band exercises given Return in 2-4 weeks if not improved     Billey Co, MD [facility]

## 2022-10-25 NOTE — Patient Instructions (Addendum)
It was wonderful to see you today.  Please bring ALL of your medications with you to every visit.   Today we talked about:  I think you have IT band syndrome- I have sent in meloxicam 15 mg daily x 2 weeks and some exercises for you to do. Don't take ibuprofen or Advil while you are taking this and keep taking your acid reflux medication daily.  For your breast pain - we will get a diagnostic mammogram, we have scheduled this for you.   Thank you for choosing [facility].   Please call [phone] with any questions about today's appointment.  Please arrive at least 15 minutes prior to your scheduled appointments.   If you had blood work today, I will send you a MyChart message or a letter if results are normal. Otherwise, I will give you a call.   If you had a referral placed, they will call you to set up an appointment. Please give [place] a call if you don't hear back in the next 2 weeks.   If you need additional refills before your next appointment, please call your pharmacy first.   Burley Saver, MD  Family Medicine

## 2022-10-25 NOTE — Assessment & Plan Note (Signed)
On exam, tenderness to palpation without masses, skin changes, or nipple discharge Ordered diagnostic mammogram + [place] and scheduled today

## 2022-11-06 ENCOUNTER — Ambulatory Visit: Payer: Medicaid Other | Admitting: Student

## 2022-11-07 ENCOUNTER — Ambulatory Visit: Payer: Medicaid Other

## 2022-11-07 ENCOUNTER — Ambulatory Visit (INDEPENDENT_AMBULATORY_CARE_PROVIDER_SITE_OTHER): Payer: Medicaid Other | Admitting: Student

## 2022-11-07 ENCOUNTER — Ambulatory Visit
Admission: RE | Admit: 2022-11-07 | Discharge: 2022-11-07 | Disposition: A | Discharge status: Home / Self Care | Payer: Medicaid Other | Source: Ambulatory Visit | Attending: Family Medicine | Admitting: Family Medicine

## 2022-11-07 VITALS — BP 124/64 | HR 60 | Ht 64.0 in | Wt 222.0 lb

## 2022-11-07 DIAGNOSIS — E669 Obesity, unspecified: Secondary | ICD-10-CM | POA: Diagnosis not present

## 2022-11-07 DIAGNOSIS — E559 Vitamin D deficiency, unspecified: Secondary | ICD-10-CM | POA: Diagnosis not present

## 2022-11-07 DIAGNOSIS — L509 Urticaria, unspecified: Secondary | ICD-10-CM | POA: Diagnosis present

## 2022-11-07 DIAGNOSIS — R7303 Prediabetes: Secondary | ICD-10-CM | POA: Diagnosis not present

## 2022-11-07 DIAGNOSIS — R635 Abnormal weight gain: Secondary | ICD-10-CM | POA: Diagnosis not present

## 2022-11-07 DIAGNOSIS — N644 Mastodynia: Secondary | ICD-10-CM

## 2022-11-07 DIAGNOSIS — N914 Secondary oligomenorrhea: Secondary | ICD-10-CM | POA: Diagnosis not present

## 2022-11-07 DIAGNOSIS — D509 Iron deficiency anemia, unspecified: Secondary | ICD-10-CM | POA: Diagnosis not present

## 2022-11-07 MED ORDER — CETIRIZINE HCL 10 MG PO TABS: 10.0000 mg | ORAL_TABLET | Freq: Every day | ORAL | 2 refills | Status: DC

## 2022-11-07 NOTE — Progress Notes (Signed)
  SUBJECTIVE:   CHIEF COMPLAINT / HPI:   Itching arms Has had itching in the arms for the last 4 days. Also feels like she has knots under skin. Notes that swelling/knots swell/increase, but have gone away. Notes allergies to shrimp/penicillin.  PERTINENT  PMH / PSH:     Patient Care Team: Cora Collum, DO as PCP - General (Family Medicine) Corky Crafts, MD as PCP - Cardiology (Cardiology) OBJECTIVE:  BP 124/64   Pulse 60   Ht 5\' 4"  (1.626 m)   Wt 222 lb (100.7 kg)   LMP 06/26/2022   SpO2 98%   BMI 38.11 kg/m  Physical Exam Constitutional:      General: She is not in acute distress.    Appearance: Normal appearance. She is not ill-appearing.  HENT:     Nose: Nose normal. No congestion or rhinorrhea.     Mouth/Throat:     Mouth: Mucous membranes are moist.     Pharynx: Oropharynx is clear. No oropharyngeal exudate or posterior oropharyngeal erythema.  Eyes:     Conjunctiva/sclera: Conjunctivae normal.  Cardiovascular:     Rate and Rhythm: Normal rate and regular rhythm.     Pulses: Normal pulses.     Heart sounds: Normal heart sounds. No murmur heard.    No friction rub.  Pulmonary:     Effort: Pulmonary effort is normal. No respiratory distress.     Breath sounds: Normal breath sounds. No stridor. No wheezing, rhonchi or rales.  Abdominal:     General: There is no distension.     Palpations: Abdomen is soft.     Tenderness: There is no abdominal tenderness.  Skin:    General: Skin is warm.     Findings: Rash (small pinpoint papules on erythematous base, minnimally scattered on arms) present.  Neurological:     Mental Status: She is alert.      ASSESSMENT/PLAN:  Urticaria Assessment & Plan: Patient notes scattered diffuse red pruritic papules with erythematous base popping up on arms over the last 4 days.  Patient cannot remember any irritant and has not used any new products on skin or 2 days but does note that some have gone away and some  continue to pop but.  Patient notes allergies to shrimp and penicillin but has not had any of either.  Given skin exam and history most concerning for urticaria given the patient is having lesions with erythematous base and pruritus resolving and reappearing in different places. - Discussed nature of urticaria - Zyrtec 20 mg until symptoms resolve - Continue Zyrtec 10 mg if symptoms persist - Precautions for anaphylaxis given   Other orders -     Cetirizine HCl; Take 1 tablet (10 mg total) by mouth daily.  Dispense: 90 tablet; Refill: 2   No follow-ups on file. [name], MD 11/09/2022, 11:59 PM PGY-2, [facility]

## 2022-11-07 NOTE — Patient Instructions (Addendum)
It was great to see you! Thank you for allowing me to participate in your care!  It looks like you have Urticaria, which is an allergic reaction in your skin, to allergens (things you are allergic to). It's not necessarily from a new allergen, it's just a results of all allergen exposures you've had, adding up, and causing this reaction, with your increased sensitivity.  Our plans for today:  - Urticaria  Take Zyrtec 20 mg daily x 7 days  This should help control your symptoms and resolve the issue  You may have to take zyrtec long term, if these spots continue to arise   If continually needed, will try the 10 mg dose  Can try Benadryl cream to see if you tolerate it  Cold packs on itchy areas will resolve the itching for a short while  -Emergency Care  Seek immediate emergency care if Having severe reaction with many multiple spots and itching/worsening w/ more affected areas or high burden of spots   Having changes in breathing/difficulty breathing   Having vomiting/diarrhea w/ these symptoms   Take care and seek immediate care sooner if you develop any concerns.   Dr. [name], MD [facility]

## 2022-11-09 NOTE — Assessment & Plan Note (Signed)
Patient notes scattered diffuse red pruritic papules with erythematous base popping up on arms over the last 4 days.  Patient cannot remember any irritant and has not used any new products on skin or 2 days but does note that some have gone away and some continue to pop but.  Patient notes allergies to shrimp and penicillin but has not had any of either.  Given skin exam and history most concerning for urticaria given the patient is having lesions with erythematous base and pruritus resolving and reappearing in different places. - Discussed nature of urticaria - Zyrtec 20 mg until symptoms resolve - Continue Zyrtec 10 mg if symptoms persist - Precautions for anaphylaxis given

## 2022-12-09 NOTE — Progress Notes (Deleted)
NEUROLOGY FOLLOW UP OFFICE NOTE  NIMAH ILL [id number]  Subjective:  Breanna Miranda is a 37 y.o. year old left-handed female with a medical history of first degree AV block, GERD, depression, iron deficiency anemia, fibroids who we last saw on 09/13/22.  To briefly review: Patient started megace in 07/2022 for fibroids. She will be having a hysterectomy next week. About 1 week after starting it, it caused achiness of arms then feet. She gets frequent cramps. She mentioned this to her doctors, but this was not a known side effect and felt to be unrelated to symptoms. She stopped taking the medication for about 3 days, but did not change symptoms. She now has tingling in both shoulders into both hands. Initially the left arm was worse but now the right. She has tingling in her feet as well. She has severe cramps where her toes will curl and get stuck. If she is using her hands, they will cramp up. She denies neck or back pain. She denies any changes to bowel or bladder. She denies imbalance or falls.   She has not had significant symptoms until 07/2022 including vision changes, numbness, tingling, or weakness.   Patient has been prescribed Lyrica 50 mg BID. She has been on this 3-4 weeks and has not noticed a difference.   Patient has been told she may have carpal tunnel in the past and responded to splinting. Her current symptoms are significantly different per patient.   She does not report any constitutional symptoms like fever, night sweats, anorexia or unintentional weight loss. She has gained 17 pounds since starting megace.   EtOH use: Very rare wine  Restrictive diet? No Family history of neuropathy/myopathy/neurologic disease? Mother has fibromyalgia, whose symptoms are similar to patient  Most recent Assessment and Plan (09/13/22): The etiology of patient's symptoms is currently unclear. While she mentions a possible history of carpal tunnel, her current symptoms are much  different by her report. She has more achiness and symptoms are from bilateral shoulders and in both legs. She relates that her symptoms started soon after starting Megace. I do not see these symptoms as known side effects of the medication though. Her symptoms sound more rheumatologic or like fibromyalgia, which her mother also has (with similar symptoms to patient). I will get lab work today to look for treatable causes. We discussed EMG, but given that it is thought to be low yield, will defer for now. She is not benefiting from Lyrica, so we will stop this and try Cymbalta.   PLAN: -Blood work: vit D, PTH, ionized Ca, ANA, ENA, ESR, CRP, CK -Will taper off Lyrica as she does not feel benefit:             -Reduce to 25 mg BID today for 1 week, then             -Reduce to 25 mg daily for 1 week, then stop -Start Cymbalta 30 mg qhs for 1 week, then increase to 60 mg qhs  Since their last visit: Vitamin D level was low, for which I recommended supplementation with 1000 international units daily. *** ESR was also elevated to 60. Labs were otherwise unremarkable.  *** Symptoms responding to Cymbalta?***  MEDICATIONS:  Outpatient Encounter Medications as of 12/13/2022  Medication Sig   acetaminophen (TYLENOL) 500 MG tablet Take 1,000 mg by mouth every 6 (six) hours as needed for mild pain.   cetirizine (ZYRTEC) 10 MG tablet Take 1 tablet (10 mg  total) by mouth daily.   Cyanocobalamin (VITAMIN B-12 PO) Take by mouth.   diclofenac Sodium (VOLTAREN) 1 % GEL Apply 1 Application topically 2 (two) times daily as needed (pain).   ferrous sulfate 325 (65 FE) MG tablet Take 325 mg by mouth daily with breakfast.   fluticasone (FLONASE) 50 MCG/ACT nasal spray SHAKE LIQUID AND USE 2 SPRAYS IN EACH NOSTRIL DAILY (Patient taking differently: Place 2 sprays into both nostrils daily as needed for allergies.)   ibuprofen (ADVIL) 800 MG tablet Take 1 tablet (800 mg total) by mouth every 8 (eight) hours.    meloxicam (MOBIC) 15 MG tablet Take 1 tablet (15 mg total) by mouth daily.   Multiple Vitamins-Minerals (ZINC PO) Take 1 tablet by mouth in the morning.   pantoprazole (PROTONIX) 40 MG tablet TAKE 1 TABLET(40 MG) BY MOUTH DAILY Strength: 40 mg   [DISCONTINUED] montelukast (SINGULAIR) 10 MG tablet Take 1 tablet (10 mg total) by mouth at bedtime.   No facility-administered encounter medications on file as of 12/13/2022.    PAST MEDICAL HISTORY: Past Medical History:  Diagnosis Date   Anemia    Anxiety    Asthma    Blood transfusion without reported diagnosis 2016   Chiari I malformation (HCC)    Complication of anesthesia    Patient states he heart beat decreases, shallow breathing   COVID 07/14/2020   Depression    Dyspnea    Empty sella (HCC)    Family history of adverse reaction to anesthesia    mother heart beat decreases   GERD (gastroesophageal reflux disease)    Irregular heart rhythm    Low grade squamous intraepith lesion on cytologic smear cervix (lgsil) 01/15/2018   Menorrhagia     PAST SURGICAL HISTORY: Past Surgical History:  Procedure Laterality Date   BREAST BIOPSY Left 12/2020   CHOLECYSTECTOMY     TUBAL LIGATION     VAGINAL HYSTERECTOMY Bilateral 09/17/2022   Procedure: VAGINAL HYSTERECTOMY WITH BILATERAL SALPINGECTOMY;  Surgeon: Hermina Staggers, MD;  Location: [facility] OR;  Service: Gynecology;  Laterality: Bilateral;    ALLERGIES: Allergies  Allergen Reactions   Penicillins Anaphylaxis, Hives and Itching    Has patient had a PCN reaction causing immediate rash, facial/tongue/throat swelling, SOB or lightheadedness with hypotension: Yes Has patient had a PCN reaction causing severe rash involving mucus membranes or skin necrosis: No Has patient had a PCN reaction that required hospitalization No Has patient had a PCN reaction occurring within the last 10 years: Yes If all of the above answers are "NO", then may proceed with Cephalosporin use.    Shrimp  [Shellfish Allergy] Anaphylaxis, Hives and Itching    FAMILY HISTORY: Family History  Problem Relation Age of Onset   Diabetes Father    Kidney disease Father        failure due to diabetes   Hypertension Mother    Heart attack Maternal Grandmother 56    SOCIAL HISTORY: Social History   Tobacco Use   Smoking status: Former    Packs/day: 0.10    Years: 2.00    Additional pack years: 0.00    Total pack years: 0.20    Types: Cigarettes    Quit date: 05/25/2007    Years since quitting: 15.5   Smokeless tobacco: Never  Vaping Use   Vaping Use: Never used  Substance Use Topics   Alcohol use: Yes    Alcohol/week: 1.0 standard drink of alcohol    Types: 1 Glasses of wine per  week    Comment: occaional   Drug use: No   Social History   Social History Narrative   Are you right handed or left handed? left   Are you currently employed ?    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   Who lives with you? Husband and two children   What type of home do you live in: 1 story or 2 story? two    Caffeine none      Objective:  Vital Signs:  LMP 06/26/2022   ***  Labs and Imaging review: New results: 09/13/22: ESR 60 Vit D 11.26 CK 171 CRP < 1.0 ANA and ENA negative Ionized Ca wnl PTH wnl  09/18/22: CBC significant for Hb 9.7 (chronic), MCV 64.4 BMP unremarkable  Previously reviewed results: TSH (08/02/22): 1.62 B12 (08/02/22): 409 CBC (08/02/22): Hb 9.4 (chronic), MCV 69 BMP (09/12/22): unremarkable   Imaging: CT cervical spine wo contrast (01/22/21): FINDINGS: Alignment: Mild nonspecific reversal of the expected cervical lordosis. No significant spondylolisthesis.   Skull base and vertebrae: The basion-dental and atlanto-dental intervals are maintained.No evidence of acute fracture to the cervical spine.   Soft tissues and spinal canal: No prevertebral fluid or swelling. No visible canal hematoma.   Disc levels: Cervical  spondylosis. Most notably at C5-C6, there is mild-to-moderate disc space narrowing with a disc bulge and uncovertebral hypertrophy on the left. No appreciable high-grade spinal canal stenosis. No significant bony neural foraminal narrowing. C5-C6 ventral osteophyte.   Upper chest: No consolidation within the imaged lung apices. No visible pneumothorax.   Other: Incidentally noted, the right first rib appears to also articulate with the right C7 transverse process.   IMPRESSION: No evidence of acute fracture to the cervical spine.   Mild nonspecific reversal of the expected cervical lordosis.   Cervical spondylosis, as described and greatest at C5-C6.  Assessment/Plan:  This is Breanna Miranda, a 37 y.o. female with: ***   Plan: *** -Cymbalta*** ?EMG***  Return to clinic in ***  Total time spent reviewing records, interview, history/exam, documentation, and coordination of care on day of encounter:  *** min  Jacquelyne Balint, MD

## 2022-12-13 ENCOUNTER — Encounter: Payer: Self-pay | Admitting: Neurology

## 2022-12-13 ENCOUNTER — Other Ambulatory Visit: Payer: Self-pay

## 2022-12-13 ENCOUNTER — Ambulatory Visit: Payer: Medicaid Other | Admitting: Neurology

## 2022-12-13 ENCOUNTER — Ambulatory Visit: Payer: Medicaid Other

## 2022-12-13 ENCOUNTER — Ambulatory Visit (INDEPENDENT_AMBULATORY_CARE_PROVIDER_SITE_OTHER): Payer: Medicaid Other | Admitting: Family Medicine

## 2022-12-13 ENCOUNTER — Encounter: Payer: Self-pay | Admitting: Family Medicine

## 2022-12-13 ENCOUNTER — Other Ambulatory Visit: Payer: Self-pay | Admitting: Family Medicine

## 2022-12-13 VITALS — BP 110/75 | HR 78 | Ht 64.0 in | Wt 213.0 lb

## 2022-12-13 DIAGNOSIS — K219 Gastro-esophageal reflux disease without esophagitis: Secondary | ICD-10-CM

## 2022-12-13 DIAGNOSIS — R110 Nausea: Secondary | ICD-10-CM

## 2022-12-13 DIAGNOSIS — R1084 Generalized abdominal pain: Secondary | ICD-10-CM

## 2022-12-13 DIAGNOSIS — R002 Palpitations: Secondary | ICD-10-CM | POA: Diagnosis not present

## 2022-12-13 MED ORDER — ONDANSETRON 4 MG PO TBDP
4.0000 mg | ORAL_TABLET | Freq: Three times a day (TID) | ORAL | 0 refills | Status: DC | PRN
Start: 2022-12-13 — End: 2023-06-22

## 2022-12-13 MED ORDER — FAMOTIDINE 20 MG PO TABS
20.0000 mg | ORAL_TABLET | Freq: Two times a day (BID) | ORAL | 1 refills | Status: DC
Start: 2022-12-13 — End: 2022-12-13

## 2022-12-13 NOTE — Patient Instructions (Addendum)
It was wonderful to see you today.  Please bring ALL of your medications with you to every visit.   Today we talked about:  We are doing lab work today to check your hemoglobin, your white blood cell count, your kidney/liver function, electrolytes and to check for pancreatitis. I will send you a MyChart message if you have MyChart. Otherwise, I will give you a call for abnormal results or send a letter if everything returned back normal. If you don't hear from me in 2 weeks, please call the office.    I have sent in a medication for nausea which you can take every 8 hours as needed. I would recommend a clear liquid diet and some bowel rest for the next 24 hours, then slowly advance your diet.  Take MiraLAX for your constipation.   I would recommend switching to Pepcid from Pantoprazole in case this does not improve and we would want to check for a bacteria called h. Pylori.   Go to the Emergency Department if you have worsening pain, recurrent nausea/vomiting.    Thank you for coming to your visit as scheduled. We have had a large "no-show" problem lately, and this significantly limits our ability to see and care for patients. As a friendly reminder- if you cannot make your appointment please call to cancel. We do have a no show policy for those who do not cancel within 24 hours. Our policy is that if you miss or fail to cancel an appointment within 24 hours, 3 times in a 71-month period, you may be dismissed from our clinic.   Thank you for choosing [facility].   Please call [phone] with any questions about today's appointment.  Please be sure to schedule follow up at the front  desk before you leave today.   Sabino Dick, DO PGY-3 Family Medicine

## 2022-12-13 NOTE — Progress Notes (Signed)
SUBJECTIVE:   CHIEF COMPLAINT / HPI:   Breanna Miranda is a 37 y.o. female who presents to the [facility] clinic today to discuss the following concerns:   Heart Palpitations Happened a couple times last week. Felt like her heart was skipping beats, "beating kind of funny". Happened at work. She said that she previously had these symptoms when her hemoglobin was low. She has not felt this since she had her hysterectomy.   Abdominal Pain Reports having generalized abdominal pain yesterday that she describes as "burning". Was excruciating, would rate higher than a 10. She was laying down at the time. Was worse with movement but also so painful it was hard for her to move. Feels nauseous and lightheaded. Has not vomited. Feels weak.   Today the pain has lightened up (rates it 7) but she did not sleep well. Did not go to work today.  She does have hx of reflux. She tried Protonix without relief.  No diarrhea. She has had constipation for the last week. She had small BM today, saw some blood but thinks it may have been from straining.   Had hysterectomy in March. She does endorse some lower abdominal pain when she voids or has BM. No dysuria though.   PERTINENT  PMH / PSH: Hypertension, anxiety, peripheral neuropathy  OBJECTIVE:   BP 110/75   Pulse 78   Ht 5\' 4"  (1.626 m)   Wt 213 lb (96.6 kg)   LMP 06/26/2022   SpO2 100%   BMI 36.56 kg/m    General: NAD, pleasant, able to participate in exam Cardiac: RRR, no murmurs. Respiratory: CTAB, normal effort, No wheezes, rales or rhonchi Abdomen: Bowel sounds hypoactive, generalized tenderness on palpation of all quadrants without rebound or guarding, abdomen is soft, no hepatosplenomegaly Skin: warm and dry Psych: Normal affect and mood  ASSESSMENT/PLAN:   1. Palpitations Hx of similar sx with anemia in the past.  She is status post abdominal hysterectomy for definitive treatment of her menorrhagia, reports she has not had symptoms  since her surgery. Discussed anemia, thyroid abnormalities, and electrolyte abnormalities can cause symptoms. Seems infrequent and none this week. Check labs. If persists, consider Zio patch for further evaluation of arrhythmia.  - TSH Rfx on Abnormal to Free T4 - CBC -CMP   2. Generalized abdominal pain Acute onset after intercourse last night. Without fever, vomiting. Has had nausea and constipation for the last week. Vitals stable. No signs of peritonitis on examination today. Doubt obstruction as she had a BM this morning. S/p hysterectomy so no concern for pregnancy. No urinary symptoms to suggest UTI. Ddx includes constipation, pancreatitis, diverticulitis, IBS, gastritis. Check labs. Defer CT for now given acute onset and some improvement with supportive care. - CBC - Comprehensive metabolic panel - Lipase - Strict ED precautions provided.  3. Gastroesophageal reflux disease, unspecified whether esophagitis present Currently on Protonix. Will have her switch to pepcid for now as we may want to test for h. Pylori in the future if symptoms persist. She was amenable to plan. Doubt gastric ulcer at this time.  - famotidine (PEPCID) 20 MG tablet; Take 1 tablet (20 mg total) by mouth 2 (two) times daily.  Dispense: 60 tablet; Refill: 1  4. Nausea Without vomiting. Work up to investigate cause of abdominal pain is above. Will treat symptomatically for now. ED precautions provided. - ondansetron (ZOFRAN-ODT) 4 MG disintegrating tablet; Take 1 tablet (4 mg total) by mouth every 8 (eight) hours as needed  for nausea.  Dispense: 10 tablet; Refill: 0   Sabino Dick, DO [facility]

## 2022-12-14 LAB — COMPREHENSIVE METABOLIC PANEL
ALT: 13 IU/L (ref 0–32)
AST: 18 IU/L (ref 0–40)
Albumin: 4 g/dL (ref 3.9–4.9)
Alkaline Phosphatase: 89 IU/L (ref 44–121)
BUN/Creatinine Ratio: 18 (ref 9–23)
BUN: 16 mg/dL (ref 6–20)
Bilirubin Total: 0.4 mg/dL (ref 0.0–1.2)
CO2: 23 mmol/L (ref 20–29)
Calcium: 9.6 mg/dL (ref 8.7–10.2)
Chloride: 102 mmol/L (ref 96–106)
Creatinine, Ser: 0.88 mg/dL (ref 0.57–1.00)
Globulin, Total: 3.1 g/dL (ref 1.5–4.5)
Glucose: 90 mg/dL (ref 70–99)
Potassium: 4.1 mmol/L (ref 3.5–5.2)
Sodium: 138 mmol/L (ref 134–144)
Total Protein: 7.1 g/dL (ref 6.0–8.5)
eGFR: 86 mL/min/{1.73_m2} (ref >59)

## 2022-12-14 LAB — CBC
Hematocrit: 38.1 % (ref 34.0–46.6)
Hemoglobin: 11.8 g/dL (ref 11.1–15.9)
MCH: 22.5 pg — ABNORMAL LOW (ref 26.6–33.0)
MCHC: 31 g/dL — ABNORMAL LOW (ref 31.5–35.7)
MCV: 73 fL — ABNORMAL LOW (ref 79–97)
Platelets: 285 10*3/uL (ref 150–450)
RBC: 5.24 x10E6/uL (ref 3.77–5.28)
RDW: 18.4 % — ABNORMAL HIGH (ref 11.7–15.4)
WBC: 6.4 10*3/uL (ref 3.4–10.8)

## 2022-12-14 LAB — TSH RFX ON ABNORMAL TO FREE T4: TSH: 1.47 u[IU]/mL (ref 0.450–4.500)

## 2022-12-14 LAB — LIPASE: Lipase: 44 U/L (ref 14–72)

## 2022-12-16 ENCOUNTER — Ambulatory Visit: Payer: Medicaid Other | Admitting: Obstetrics and Gynecology

## 2023-01-15 ENCOUNTER — Encounter: Payer: Self-pay | Admitting: Interventional Cardiology

## 2023-01-15 NOTE — Telephone Encounter (Signed)
Error

## 2023-02-05 MED ORDER — PANTOPRAZOLE SODIUM 40 MG PO TBEC: DELAYED_RELEASE_TABLET | ORAL | 0 refills | Status: DC

## 2023-02-05 NOTE — Addendum Note (Signed)
Addended byPenne Lash on: 02/05/2023 12:02 PM   Modules accepted: Orders

## 2023-02-13 ENCOUNTER — Encounter (HOSPITAL_COMMUNITY): Payer: Self-pay | Admitting: Emergency Medicine

## 2023-02-13 ENCOUNTER — Other Ambulatory Visit: Payer: Self-pay

## 2023-02-13 ENCOUNTER — Emergency Department (HOSPITAL_COMMUNITY)
Admission: EM | Admit: 2023-02-13 | Discharge: 2023-02-14 | Disposition: A | Discharge status: Home / Self Care | Payer: Medicaid Other | Source: Home / Self Care | Attending: Emergency Medicine | Admitting: Emergency Medicine

## 2023-02-13 DIAGNOSIS — R002 Palpitations: Secondary | ICD-10-CM

## 2023-02-13 DIAGNOSIS — Z87891 Personal history of nicotine dependence: Secondary | ICD-10-CM | POA: Diagnosis not present

## 2023-02-13 DIAGNOSIS — J45909 Unspecified asthma, uncomplicated: Secondary | ICD-10-CM | POA: Diagnosis not present

## 2023-02-13 DIAGNOSIS — I491 Atrial premature depolarization: Secondary | ICD-10-CM | POA: Diagnosis not present

## 2023-02-13 DIAGNOSIS — I493 Ventricular premature depolarization: Secondary | ICD-10-CM | POA: Diagnosis not present

## 2023-02-13 LAB — URINALYSIS, ROUTINE W REFLEX MICROSCOPIC
Bacteria, UA: NONE SEEN
Bilirubin Urine: NEGATIVE
Glucose, UA: NEGATIVE mg/dL
Hgb urine dipstick: NEGATIVE
Ketones, ur: NEGATIVE mg/dL
Nitrite: NEGATIVE
Protein, ur: NEGATIVE mg/dL
Specific Gravity, Urine: 1.013 (ref 1.005–1.030)
pH: 7 (ref 5.0–8.0)

## 2023-02-13 LAB — CBC
HCT: 38.9 % (ref 36.0–46.0)
Hemoglobin: 12.3 g/dL (ref 12.0–15.0)
MCH: 24.3 pg — ABNORMAL LOW (ref 26.0–34.0)
MCHC: 31.6 g/dL (ref 30.0–36.0)
MCV: 76.9 fL — ABNORMAL LOW (ref 80.0–100.0)
Platelets: 249 10*3/uL (ref 150–400)
RBC: 5.06 MIL/uL (ref 3.87–5.11)
RDW: 16.1 % — ABNORMAL HIGH (ref 11.5–15.5)
WBC: 6.4 10*3/uL (ref 4.0–10.5)
nRBC: 0 % (ref 0.0–0.2)

## 2023-02-13 NOTE — ED Triage Notes (Signed)
Pt reports feeling "palpitations" all day today. She states she has had these intermittently in the past but they always go away.  No n/v/d or shob.

## 2023-02-14 ENCOUNTER — Encounter: Payer: Self-pay | Admitting: General Practice

## 2023-02-14 ENCOUNTER — Ambulatory Visit: Payer: Medicaid Other | Attending: General Practice | Admitting: General Practice

## 2023-02-14 ENCOUNTER — Ambulatory Visit: Payer: Medicaid Other | Attending: General Practice

## 2023-02-14 VITALS — BP 100/62 | Ht 64.0 in | Wt 211.6 lb

## 2023-02-14 DIAGNOSIS — E669 Obesity, unspecified: Secondary | ICD-10-CM | POA: Diagnosis not present

## 2023-02-14 DIAGNOSIS — R400 Somnolence: Secondary | ICD-10-CM | POA: Diagnosis not present

## 2023-02-14 DIAGNOSIS — R079 Chest pain, unspecified: Secondary | ICD-10-CM | POA: Diagnosis not present

## 2023-02-14 DIAGNOSIS — R002 Palpitations: Secondary | ICD-10-CM

## 2023-02-14 LAB — BASIC METABOLIC PANEL
Anion gap: 10 (ref 5–15)
BUN: 9 mg/dL (ref 6–20)
CO2: 25 mmol/L (ref 22–32)
Calcium: 8.7 mg/dL — ABNORMAL LOW (ref 8.9–10.3)
Chloride: 102 mmol/L (ref 98–111)
Creatinine, Ser: 0.83 mg/dL (ref 0.44–1.00)
GFR, Estimated: >60 mL/min (ref >60)
Glucose, Bld: 116 mg/dL — ABNORMAL HIGH (ref 70–99)
Potassium: 3.9 mmol/L (ref 3.5–5.1)
Sodium: 137 mmol/L (ref 135–145)

## 2023-02-14 LAB — TROPONIN I (HIGH SENSITIVITY)
Troponin I (High Sensitivity): 3 ng/L (ref <18)
Troponin I (High Sensitivity): 3 ng/L (ref <18)

## 2023-02-14 LAB — MAGNESIUM: Magnesium: 1.9 mg/dL (ref 1.7–2.4)

## 2023-02-14 NOTE — Progress Notes (Unsigned)
Enrolled for [employer] to mail a ZIO XT long term holter monitor to the patients address on file.   Dr. Varanasi to read. 

## 2023-02-14 NOTE — Discharge Instructions (Signed)
You were evaluated in the Emergency Department and after careful evaluation, we did not find any emergent condition requiring admission or further testing in the hospital.  Your exam/testing today is overall reassuring.  Recommend follow-up with the cardiology team to discuss your symptoms. Please return to the Emergency Department if you experience any worsening of your condition.   Thank you for allowing [place] to be a part of your care.

## 2023-02-14 NOTE — ED Provider Notes (Signed)
[facility] Emergency Department Provider Note MRN:  [id number]  Arrival date & time: 02/14/23     Chief Complaint   Chest Pain   History of Present Illness   Breanna Miranda is a 37 y.o. year-old female with no pertinent past medical history presenting to the ED with chief complaint of chest pain.  Patient has been having increased palpitations over the past day or 2.  She is used to getting a flutter type palpitation on and off but it is infrequent.  Today it has been constant.  Had some chest pressure briefly earlier today as well.  No shortness of breath, no leg pain or swelling.  Review of Systems  A thorough review of systems was obtained and all systems are negative except as noted in the HPI and PMH.   Patient's Health History    Past Medical History:  Diagnosis Date   Anemia    Anxiety    Asthma    Blood transfusion without reported diagnosis 2016   Chiari I malformation (HCC)    Complication of anesthesia    Patient states he heart beat decreases, shallow breathing   COVID 07/14/2020   Depression    Dyspnea    Empty sella (HCC)    Family history of adverse reaction to anesthesia    mother heart beat decreases   GERD (gastroesophageal reflux disease)    Irregular heart rhythm    Low grade squamous intraepith lesion on cytologic smear cervix (lgsil) 01/15/2018   Menorrhagia     Past Surgical History:  Procedure Laterality Date   BREAST BIOPSY Left 12/2020   CHOLECYSTECTOMY     TUBAL LIGATION     VAGINAL HYSTERECTOMY Bilateral 09/17/2022   Procedure: VAGINAL HYSTERECTOMY WITH BILATERAL SALPINGECTOMY;  Surgeon: Hermina Staggers, MD;  Location: [facility];  Service: Gynecology;  Laterality: Bilateral;    Family History  Problem Relation Age of Onset   Diabetes Father    Kidney disease Father        failure due to diabetes   Hypertension Mother    Heart attack Maternal Grandmother 38    Social History   Socioeconomic History    Marital status: Married    Spouse name: Not on file   Number of children: Not on file   Years of education: Not on file   Highest education level: Not on file  Occupational History   Not on file  Tobacco Use   Smoking status: Former    Current packs/day: 0.00    Average packs/day: 0.1 packs/day for 2.0 years (0.2 ttl pk-yrs)    Types: Cigarettes    Start date: 05/24/2005    Quit date: 05/25/2007    Years since quitting: 15.7   Smokeless tobacco: Never  Vaping Use   Vaping status: Never Used  Substance and Sexual Activity   Alcohol use: Yes    Alcohol/week: 1.0 standard drink of alcohol    Types: 1 Glasses of wine per week    Comment: occaional   Drug use: No   Sexual activity: Yes    Birth control/protection: Surgical  Other Topics Concern   Not on file  Social History Narrative   Are you right handed or left handed? left   Are you currently employed ?    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   Who lives with you? Husband and two children   What type of home do you live  in: 1 story or 2 story? two    Caffeine none   Social Determinants of Health   Financial Resource Strain: Not on file  Food Insecurity: Not on file  Transportation Needs: Not on file  Physical Activity: Not on file  Stress: Not on file  Social Connections: Not on file  Intimate Partner Violence: Not on file     Physical Exam   Vitals:   02/14/23 0300 02/14/23 0315  BP: 105/72 111/79  Pulse: 64 67  Resp: 14 19  Temp:    SpO2: 99% 100%    CONSTITUTIONAL: Well-appearing, NAD NEURO/PSYCH:  Alert and oriented x 3, no focal deficits EYES:  eyes equal and reactive ENT/NECK:  no LAD, no JVD CARDIO: Regular rate, well-perfused, normal S1 and S2 PULM:  CTAB no wheezing or rhonchi GI/GU:  non-distended, non-tender MSK/SPINE:  No gross deformities, no edema SKIN:  no rash, atraumatic   *Additional and/or pertinent findings included in MDM below  Diagnostic  and Interventional Summary    EKG Interpretation Date/Time:  Thursday February 13 2023 22:46:18 EDT Ventricular Rate:  68 PR Interval:  216 QRS Duration:  70 QT Interval:  390 QTC Calculation: 414 R Axis:   69  Text Interpretation: Sinus rhythm with 1st degree A-V block Cannot rule out Anterior infarct , age undetermined Abnormal ECG When compared with ECG of 21-Jun-2022 15:06, PREVIOUS ECG IS PRESENT Confirmed by Kennis Carina [phone]) on 02/14/2023 2:57:36 AM       Labs Reviewed  BASIC METABOLIC PANEL - Abnormal; Notable for the following components:      Result Value   Glucose, Bld 116 (*)    Calcium 8.7 (*)    All other components within normal limits  CBC - Abnormal; Notable for the following components:   MCV 76.9 (*)    MCH 24.3 (*)    RDW 16.1 (*)    All other components within normal limits  URINALYSIS, ROUTINE W REFLEX MICROSCOPIC - Abnormal; Notable for the following components:   APPearance HAZY (*)    Leukocytes,Ua LARGE (*)    All other components within normal limits  MAGNESIUM  TROPONIN I (HIGH SENSITIVITY)  TROPONIN I (HIGH SENSITIVITY)    No orders to display    Medications - No data to display   Procedures  /  Critical Care .1-3 Lead EKG Interpretation  Performed by: Sabas Sous, MD Authorized by: Sabas Sous, MD     Interpretation: abnormal     ECG rate:  70s   ECG rate assessment: normal     Rhythm: sinus rhythm     Ectopy: PAC     Conduction: normal   Comments:     Cardiac monitoring was ordered to monitor the patient for dysrhythmia.  I personally interpreted the patient's cardiac monitor while at the bedside.  Frequent PACs noted.     ED Course and Medical Decision Making  Initial Impression and Ddx Palpitations, brief chest pain earlier today.  Vital signs reassuring.  On the monitor does appear the patient is having occasional PACs.  EKG demonstrates sinus rhythm.  PERC negative, highly doubt PE.  Highly doubt ACS.  Not currently  having any chest pain and has very little cardiovascular risk factors.  Will check electrolytes to ensure no abnormalities causing the ectopy.  Past medical/surgical history that increases complexity of ED encounter: None  Interpretation of Diagnostics I personally reviewed the EKG and my interpretation is as follows: First-degree AV block, sinus rhythm  Labs  reassuring with no significant blood count or electrolyte disturbance  Patient Reassessment and Ultimate Disposition/Management     Patient is appropriate for discharge with cardiology follow-up.  Patient management required discussion with the following services or consulting groups:  None  Complexity of Problems Addressed Acute illness or injury that poses threat of life of bodily function  Additional Data Reviewed and Analyzed Further history obtained from: Prior labs/imaging results  Additional Factors Impacting ED Encounter Risk None  Elmer Sow. Pilar Plate, MD [facility] Emergency Medicine [facility] [email or web address]  Final Clinical Impressions(s) / ED Diagnoses     ICD-10-CM   1. Palpitations  R00.2 Ambulatory referral to Cardiology    2. Premature atrial contractions  I49.1 Ambulatory referral to Cardiology      ED Discharge Orders          Ordered    Ambulatory referral to Cardiology        02/14/23 0355             Discharge Instructions Discussed with and Provided to Patient:    Discharge Instructions      You were evaluated in the Emergency Department and after careful evaluation, we did not find any emergent condition requiring admission or further testing in the hospital.  Your exam/testing today is overall reassuring.  Recommend follow-up with the cardiology team to discuss your symptoms.  Please return to the Emergency Department if you experience any worsening of your condition.   Thank you for allowing [place] to be a part of your care.      Sabas Sous,  MD 02/14/23 [phone]

## 2023-02-14 NOTE — Patient Instructions (Addendum)
Medication Instructions:  The current medical regimen is effective;  continue present plan and medications as directed. Please refer to the Current Medication list given to you today.  *If you need a refill on your cardiac medications before your next appointment, please call your pharmacy*  Lab Work: No Labs If you have labs (blood work) drawn today and your tests are completely normal, you will receive your results only by: MyChart Message (if you have MyChart) OR A paper copy in the mail If you have any lab test that is abnormal or we need to change your treatment, we will call you to review the results.  Testing/Procedures: WATCH PAT MONITOR-SEE BELOW  Follow-Up: At [facility], you and your health needs are our priority.  As part of our continuing mission to provide you with exceptional heart care, we have created designated Provider Care Teams.  These Care Teams include your primary Cardiologist (physician) and Advanced Practice Providers (APPs -  Physician Assistants and Nurse Practitioners) who all work together to provide you with the care you need, when you need it.  We recommend signing up for the patient portal called "MyChart".  Sign up information is provided on this After Visit Summary.  MyChart is used to connect with patients for Virtual Visits (Telemedicine).  Patients are able to view lab/test results, encounter notes, upcoming appointments, etc.  Non-urgent messages can be sent to your provider as well.   To learn more about what you can do with MyChart, go to [email or web address].    Your next appointment:   2-3 month(s)  Provider:   Edd Fabian, FNP     [facility] Is a FDA cleared portable home sleep study test that uses a watch and 3 points of contact to monitor 7 different channels, including your heart rate, oxygen saturations, body position, snoring, and chest motion.  The study is easy to use from the comfort of your own home and accurately detect  sleep apnea.  Before bed, you attach the chest sensor, attached the sleep apnea bracelet to your nondominant hand, and attach the finger probe.  After the study, the raw data is downloaded from the watch and scored for apnea events.   For more information: [email or web address]  Patient Testing Instructions:  Do not put battery into the device until bedtime when you are ready to begin the test. Please call the support number if you need assistance after following the instructions below: 24 hour support line- [phone] or [facility] support at [phone] (option 2)  Download the IntelWatchPAT One" app through the google play store or App Store  Be sure to turn on or enable access to bluetooth in settlings on your smartphone/ device  Make sure no other bluetooth devices are on and within the vicinity of your smartphone/ device and WatchPAT watch during testing.  Make sure to leave your smart phone/ device plugged in and charging all night.  When ready for bed:  Follow the instructions step by step in the WatchPAT One App to activate the testing device. For additional instructions, including video instruction, visit the WatchPAT One video on Youtube. You can search for WatchPat One within Youtube (video is 4 minutes and 18 seconds) or enter: https://youtube/watch?[email or web address]_vbiwxE Please note: You will be prompted to enter a Pin to connect via bluetooth when starting the test. The PIN will be assigned to you when you receive the test.  The device is disposable, but it recommended that you retain the device until you receive a call  letting you know the study has been received and the results have been interpreted.  We will let you know if the study did not transmit to [place] properly after the test is completed. You do not need to call us to confirm the receipt of the test.  Please complete the test within 48 hours of receiving PIN.   Frequently Asked Questions:  What is Watch Dennie Bible one?   A single use fully disposable home sleep apnea testing device and will not need to be returned after completion.  What are the requirements to use WatchPAT one?  The be able to have a successful watchpat one sleep study, you should have your Watch pat one device, your smart phone, watch pat one app, your PIN number and Internet access What type of phone do I need?  You should have a smart phone that uses Android 5.1 and above or any Iphone with IOS 10 and above How can I download the WatchPAT one app?  Based on your device type search for WatchPAT one app either in google play for android devices or APP store for Iphone's Where will I get my PIN for the study?  Your PIN will be provided by your physician's office. It is used for authentication and if you lose/forget your PIN, please reach out to your providers office.  I do not have Internet at home. Can I do WatchPAT one study?  WatchPAT One needs Internet connection throughout the night to be able to transmit the sleep data. You can use your home/local internet or your cellular's data package. However, it is always recommended to use home/local Internet. It is estimated that between 20MB-30MB will be used with each study.However, the application will be looking for space in the phone to start the study.  What happens if I lose internet or bluetooth connection?  During the internet disconnection, your phone will not be able to transmit the sleep data. All the data, will be stored in your phone. As soon as the internet connection is back on, the phone will being sending the sleep data. During the bluetooth disconnection, WatchPAT one will not be able to to send the sleep data to your phone. Data will be kept in the [facility] one until two devices have bluetooth connection back on. As soon as the connection is back on, WatchPAT one will send the sleep data to the phone.  How long do I need to wear the WatchPAT one?  After you start the study, you  should wear the device at least 6 hours.  How far should I keep my phone from the device?  During the night, your phone should be within 15 feet.  What happens if I leave the room for restroom or other reasons?  Leaving the room for any reason will not cause any problem. As soon as your get back to the room, both devices will reconnect and will continue to send the sleep data. Can I use my phone during the sleep study?  Yes, you can use your phone as usual during the study. But it is recommended to put your watchpat one on when you are ready to go to bed.  How will I get my study results?  A soon as you completed your study, your sleep data will be sent to the provider. They will then share the results with you when they are ready.     ZIO AT Long term monitor-Live Telemetry  Your physician has requested you wear  a ZIO patch monitor for 14 days.  This is a single patch monitor. [employer] supplies one patch monitor per enrollment. Additional  stickers are not available.  Please do not apply patch if you will be having a Nuclear Stress Test, Echocardiogram, Cardiac CT, MRI,  or Chest Xray during the period you would be wearing the monitor. The patch cannot be worn during  these tests. You cannot remove and re-apply the ZIO AT patch monitor.  Your ZIO patch monitor will be mailed 3 day USPS to your address on file. It may take 3-5 days to  receive your monitor after you have been enrolled.  Once you have received your monitor, please review the enclosed instructions. Your monitor has  already been registered assigning a specific monitor serial # to you.   Billing and Patient Assistance Program information  Meredeth Ide has been supplied with any insurance information on record for billing. [employer] offers a sliding scale Patient Assistance Program for patients without insurance, or whose  insurance does not completely cover the cost of the ZIO patch monitor. You must apply for the  Patient  Assistance Program to qualify for the discounted rate. To apply, call [employer] at [phone],  select option 4, select option 2 , ask to apply for the Patient Assistance Program, (you can request an  interpreter if needed). [employer] will ask your household income and how many people are in your  household. [employer] will quote your out-of-pocket cost based on this information. They will also be able  to set up a 12 month interest free payment plan if needed.  Applying the monitor   Shave hair from upper left chest.  Hold the abrader disc by orange tab. Rub the abrader in 40 strokes over left upper chest as indicated in  your monitor instructions.  Clean area with 4 enclosed alcohol pads. Use all pads to ensure the area is cleaned thoroughly. Let  dry.  Apply patch as indicated in monitor instructions. Patch will be placed under collarbone on left side of  chest with arrow pointing upward.  Rub patch adhesive wings for 2 minutes. Remove the white label marked "1". Remove the white label  marked "2". Rub patch adhesive wings for 2 additional minutes.  While looking in a mirror, press and release button in center of patch. A small green light will flash 3-4  times. This will be your only indicator that the monitor has been turned on.  Do not shower for the first 24 hours. You may shower after the first 24 hours.  Press the button if you feel a symptom. You will hear a small click. Record Date, Time and Symptom in  the Patient Log.   Starting the Gateway  In your kit there is a Audiological scientist box the size of a cellphone. This is Buyer, retail. It transmits all your  recorded data to [facility]. This box must always stay within 10 feet of you. Open the box and push the *  button. There will be a light that blinks orange and then green a few times. When the light stops  blinking, the Gateway is connected to the ZIO patch. Call [employer] at [phone] to confirm your monitor is  transmitting.  Returning your monitor  Remove your patch and place it inside the Gateway. In the lower half of the Gateway there is a white  bag with prepaid postage on it. Place Gateway in bag and seal. Mail package back to [place] as soon as  possible. Your physician should have your final report approximately 7 days after you have mailed back  your monitor. Call [facility] Customer Care at [phone] if you have questions regarding your ZIO AT  patch monitor. Call them immediately if you see an orange light blinking on your monitor.  If your monitor falls off in less than 4 days, contact our Monitor department at [phone]. If your  monitor becomes loose or falls off after 4 days call [employer] at [phone] for suggestions on  securing your monitor

## 2023-02-14 NOTE — Progress Notes (Signed)
Cardiology Clinic Note   Patient Name: Breanna Miranda Date of Encounter: 02/14/2023  Primary Care Provider:  Penne Lash, MD Primary Cardiologist:  Lance Muss, MD  Patient Profile    Breanna Miranda 37 year old female presents the clinic today for follow-up evaluation of her palpitations.  Past Medical History    Past Medical History:  Diagnosis Date   Anemia    Anxiety    Asthma    Blood transfusion without reported diagnosis 2016   Chiari I malformation (HCC)    Complication of anesthesia    Patient states he heart beat decreases, shallow breathing   COVID 07/14/2020   Depression    Dyspnea    Empty sella (HCC)    Family history of adverse reaction to anesthesia    mother heart beat decreases   GERD (gastroesophageal reflux disease)    Irregular heart rhythm    Low grade squamous intraepith lesion on cytologic smear cervix (lgsil) 01/15/2018   Menorrhagia    Past Surgical History:  Procedure Laterality Date   BREAST BIOPSY Left 12/2020   CHOLECYSTECTOMY     TUBAL LIGATION     VAGINAL HYSTERECTOMY Bilateral 09/17/2022   Procedure: VAGINAL HYSTERECTOMY WITH BILATERAL SALPINGECTOMY;  Surgeon: Hermina Staggers, MD;  Location: [facility] OR;  Service: Gynecology;  Laterality: Bilateral;    Allergies  Allergies  Allergen Reactions   Penicillins Anaphylaxis, Hives and Itching    Has patient had a PCN reaction causing immediate rash, facial/tongue/throat swelling, SOB or lightheadedness with hypotension: Yes Has patient had a PCN reaction causing severe rash involving mucus membranes or skin necrosis: No Has patient had a PCN reaction that required hospitalization No Has patient had a PCN reaction occurring within the last 10 years: Yes If all of the above answers are "NO", then may proceed with Cephalosporin use.    Shrimp [Shellfish Allergy] Anaphylaxis, Hives and Itching    History of Present Illness    Breanna Miranda has a PMH of asthma,  depression, dyspnea, GERD, palpitations, anxiety, and anemia.  She wore a cardiac event monitor in 2016 which was reassuring.  She was seen by Dr.Varanasi 05/31/2022.  During that time she noted a several hour episode of fast heart rate and brief episodes of palpitations.  She previously had blood transfusions and IV iron due to heavy bleeding from menstruation.  She denied chest pain, dizziness, lower extremity swelling, orthopnea.  It was felt that her chest soreness was atypical for ischemia.  Echocardiogram was ordered and showed normal LVEF and no significant valvular abnormalities.  She wore a cardiac event monitor 07/09/2022 which showed normal sinus rhythm with rare PACs and rare PVCs no sinus arrhythmia no atrial fibrillation.  She presented to the emergency department on 02/13/2023 and was discharged on 02/14/2023.  She reported chest pain.  She also noted that she had been having increased palpitations over the past 1-2 days.  She described her episodes as a flutter type palpitations that was on and off.  At the time of exam she reported her palpitations were constant.  She also noted some chest pressure that had been brief.  She denied shortness of breath and lower extremity swelling.  Her EKG showed sinus rhythm with first-degree AV block.  She was noted to have occasional PACs.  Her high-sensitivity troponin was noted to be 3 and 3 and her magnesium was 1.9.  Her BMP was unremarkable.  Her CBC was stable.  She presents to the clinic today for  follow-up evaluation and states she presented to the emergency department because she had continued palpitations.  We reviewed her lab work and EKG.  We reviewed telemetry report.  We also reviewed her previous cardiac event monitor and echocardiogram.  She expressed understanding.  We reviewed triggers for palpitations.  She reports that she does not sleep well at night and is tired through the day.  She also reports snoring.  Her STOP-BANG score is 4.  I will  order a home sleep study, 14-day cardiac event monitor, and plan follow-up in 2 to 3 months..  Today she denies chest pain, shortness of breath, lower extremity edema, fatigue,  melena, hematuria, hemoptysis, diaphoresis, weakness, presyncope, syncope, orthopnea, and PND.     Home Medications    Prior to Admission medications   Medication Sig Start Date End Date Taking? Authorizing Provider  acetaminophen (TYLENOL) 500 MG tablet Take 1,000 mg by mouth every 6 (six) hours as needed for mild pain.    [provider]  cetirizine (ZYRTEC) 10 MG tablet Take 1 tablet (10 mg total) by mouth daily. 11/07/22   [name], MD  Cyanocobalamin (VITAMIN B-12 PO) Take by mouth.    [provider]  diclofenac Sodium (VOLTAREN) 1 % GEL Apply 1 Application topically 2 (two) times daily as needed (pain). 04/08/22   [provider]  famotidine (PEPCID) 20 MG tablet TAKE 1 TABLET(20 MG) BY MOUTH TWICE DAILY 12/13/22   Cora Collum, DO  ferrous sulfate 325 (65 FE) MG tablet Take 325 mg by mouth daily with breakfast.    [provider]  fluticasone (FLONASE) 50 MCG/ACT nasal spray SHAKE LIQUID AND USE 2 SPRAYS IN EACH NOSTRIL DAILY Patient taking differently: Place 2 sprays into both nostrils daily as needed for allergies. 03/08/22   Cora Collum, DO  ibuprofen (ADVIL) 800 MG tablet Take 1 tablet (800 mg total) by mouth every 8 (eight) hours. 10/25/22   Hermina Staggers, MD  Multiple Vitamins-Minerals (ZINC PO) Take 1 tablet by mouth in the morning.    [provider]  ondansetron (ZOFRAN-ODT) 4 MG disintegrating tablet Take 1 tablet (4 mg total) by mouth every 8 (eight) hours as needed for nausea. 12/13/22   Sabino Dick, DO  pantoprazole (PROTONIX) 40 MG tablet TAKE 1 TABLET(40 MG) BY MOUTH DAILY Strength: 40 mg 02/05/23   Baloch, Mahnoor, MD  montelukast (SINGULAIR) 10 MG tablet Take 1 tablet (10 mg total) by mouth at bedtime. 05/12/18 08/02/19  Doreene Eland, MD    Family History    Family History  Problem Relation Age of Onset   Diabetes Father    Kidney disease Father        failure due to diabetes   Hypertension Mother    Heart attack Maternal Grandmother 58   She indicated that her mother is alive. She indicated that her father is deceased. She indicated that all of her four sisters are alive. She indicated that her brother is deceased. She indicated that her maternal grandmother is deceased. She indicated that both of her daughters are alive. She indicated that both of her sons are alive.  Social History    Social History   Socioeconomic History   Marital status: Married    Spouse name: Not on file   Number of children: Not on file   Years of education: Not on file   Highest education level: Not on file  Occupational History   Not on file  Tobacco Use  Smoking status: Former    Current packs/day: 0.00    Average packs/day: 0.1 packs/day for 2.0 years (0.2 ttl pk-yrs)    Types: Cigarettes    Start date: 05/24/2005    Quit date: 05/25/2007    Years since quitting: 15.7   Smokeless tobacco: Never  Vaping Use   Vaping status: Never Used  Substance and Sexual Activity   Alcohol use: Yes    Alcohol/week: 1.0 standard drink of alcohol    Types: 1 Glasses of wine per week    Comment: occaional   Drug use: No   Sexual activity: Yes    Birth control/protection: Surgical  Other Topics Concern   Not on file  Social History Narrative   Are you right handed or left handed? left   Are you currently employed ?    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   Who lives with you? Husband and two children   What type of home do you live in: 1 story or 2 story? two    Caffeine none   Social Determinants of Health   Financial Resource Strain: Not on file  Food Insecurity: Not on file  Transportation Needs: Not on file  Physical Activity: Not on file  Stress: Not on file  Social  Connections: Not on file  Intimate Partner Violence: Not on file     Review of Systems    General:  No chills, fever, night sweats or weight changes.  Cardiovascular:  No chest pain, dyspnea on exertion, edema, orthopnea, palpitations, paroxysmal nocturnal dyspnea. Dermatological: No rash, lesions/masses Respiratory: No cough, dyspnea Urologic: No hematuria, dysuria Abdominal:   No nausea, vomiting, diarrhea, bright red blood per rectum, melena, or hematemesis Neurologic:  No visual changes, wkns, changes in mental status. All other systems reviewed and are otherwise negative except as noted above.  Physical Exam    VS:  BP 100/62 (BP Location: Right Arm, Patient Position: Sitting, Cuff Size: Normal)   Ht 5\' 4"  (1.626 m)   Wt 211 lb 9.6 oz (96 kg)   LMP 06/26/2022   BMI 36.32 kg/m  , BMI Body mass index is 36.32 kg/m. GEN: Well nourished, well developed, in no acute distress. HEENT: normal. Neck: Supple, no JVD, carotid bruits, or masses. Cardiac: RRR, no murmurs, rubs, or gallops. No clubbing, cyanosis, edema.  Radials/DP/PT 2+ and equal bilaterally.  Respiratory:  Respirations regular and unlabored, clear to auscultation bilaterally. GI: Soft, nontender, nondistended, BS + x 4. MS: no deformity or atrophy. Skin: warm and dry, no rash. Neuro:  Strength and sensation are intact. Psych: Normal affect.  Accessory Clinical Findings    Recent Labs: 12/13/2022: ALT 13; TSH 1.470 02/13/2023: BUN 9; Creatinine, Ser 0.83; Hemoglobin 12.3; Platelets 249; Potassium 3.9; Sodium 137 02/14/2023: Magnesium 1.9   Recent Lipid Panel    Component Value Date/Time   CHOL 185 05/21/2021 1655   TRIG 340 (H) 05/21/2021 1655   HDL 61 05/21/2021 1655   CHOLHDL 3.0 05/21/2021 1655   CHOLHDL 2.5 08/16/2016 0452   VLDL 16 08/16/2016 0452   LDLCALC 71 05/21/2021 1655         ECG personally reviewed by me today-none today.    Echocardiogram 06/28/2022  IMPRESSIONS     1. Left  ventricular ejection fraction, by estimation, is 60 to 65%. The  left ventricle has normal function. The left ventricle has no regional  wall motion abnormalities. Left ventricular diastolic parameters were  normal.   2. Right ventricular systolic function is normal. The right ventricular  size is normal. Tricuspid regurgitation signal is inadequate for assessing  PA pressure.   3. The mitral valve is normal in structure. No evidence of mitral valve  regurgitation. No evidence of mitral stenosis.   4. The aortic valve is normal in structure. Aortic valve regurgitation is  not visualized. No aortic stenosis is present.   5. The inferior vena cava is normal in size with greater than 50%  respiratory variability, suggesting right atrial pressure of 3 mmHg.   FINDINGS   Left Ventricle: Left ventricular ejection fraction, by estimation, is 60  to 65%. The left ventricle has normal function. The left ventricle has no  regional wall motion abnormalities. 3D left ventricular ejection fraction  analysis performed but not  reported based on interpreter judgement due to suboptimal tracking. The  left ventricular internal cavity size was normal in size. There is no left  ventricular hypertrophy. Left ventricular diastolic parameters were  normal. Normal left ventricular filling   pressure.   Right Ventricle: The right ventricular size is normal. No increase in  right ventricular wall thickness. Right ventricular systolic function is  normal. Tricuspid regurgitation signal is inadequate for assessing PA  pressure.   Left Atrium: Left atrial size was normal in size.   Right Atrium: Right atrial size was normal in size.   Pericardium: There is no evidence of pericardial effusion.   Mitral Valve: The mitral valve is normal in structure. No evidence of  mitral valve regurgitation. No evidence of mitral valve stenosis.   Tricuspid Valve: The tricuspid valve is normal in structure. Tricuspid   valve regurgitation is trivial. No evidence of tricuspid stenosis.   Aortic Valve: The aortic valve is normal in structure. Aortic valve  regurgitation is not visualized. No aortic stenosis is present.   Pulmonic Valve: The pulmonic valve was normal in structure. Pulmonic valve  regurgitation is not visualized. No evidence of pulmonic stenosis.   Aorta: The aortic root is normal in size and structure.   Venous: The inferior vena cava is normal in size with greater than 50%  respiratory variability, suggesting right atrial pressure of 3 mmHg.   IAS/Shunts: No atrial level shunt detected by color flow Doppler.    Cardiac event monitor 07/09/2022    Normal sinus rhythm with rare PACs and rare PVCs.   Symptoms correlated to PACs.   No sustained arrhythmias.   No atrial fibrillation.     Patch Wear Time:  4 days and 10 hours (2023-12-17T10:07:40-0500 to 2023-12-21T20:47:22-0500)   Patient had a min HR of 59 bpm, max HR of 148 bpm, and avg HR of 87 bpm. Predominant underlying rhythm was Sinus Rhythm. Isolated SVEs were rare (<1.0%), SVE Triplets were rare (<1.0%), and no SVE Couplets were present. Isolated VEs were rare (<1.0%),  and no VE Couplets or VE Triplets were present.     Assessment & Plan   1.  Palpitations-heart rate today 68.  Reviewed previous cardiac event monitor and PACs. Avoid triggers caffeine, chocolate, EtOH, dehydration etc. Increase physical activity as tolerated Increase p.o. hydration 14-day cardiac event monitor  Chest pain of uncertain etiology-did note some chest pressure in the emergency department.  Cardiac troponins reassuring.  Echocardiogram reassuring. No plans for ischemic evaluation  Daytime somnolence-reports snoring.  STOP-BANG score 4 Home sleep study Sleep hygiene instructions given  Obesity-weight today 211.6. Heart healthy low-sodium diet Increase physical activity as tolerated Continue weight loss  Disposition: Follow-up with  Dr.Varanasi or me in 3-4 months.   Thomasene Ripple. Tyrica Afzal NP-C     02/14/2023, 9:57 AM [facility] [address] Office [phone] Fax [phone]    I spent 14 minutes examining this patient, reviewing medications, and using patient centered shared decision making involving her cardiac care.  Prior to her visit I spent greater than 20 minutes reviewing her past medical history,  medications, and prior cardiac tests.

## 2023-02-18 ENCOUNTER — Telehealth: Payer: Self-pay | Admitting: Interventional Cardiology

## 2023-02-18 NOTE — Telephone Encounter (Signed)
Patient c/o Palpitations:  High priority if patient c/o lightheadedness, shortness of breath, or chest pain  How long have you had palpitations/irregular HR/ Afib? Are you having the symptoms now? Since last Thursday, heart beats fast and hard  Are you currently experiencing lightheadedness, SOB or CP? No  Do you have a history of afib (atrial fibrillation) or irregular heart rhythm? Yes  Have you checked your BP or HR? (document readings if available): BP 113/70 HR 103  Are you experiencing any other symptoms? No

## 2023-02-18 NOTE — Telephone Encounter (Signed)
Call placed to patient but no answer. Unable to leave a message

## 2023-02-18 NOTE — Telephone Encounter (Signed)
Patient returned RN's call. 

## 2023-02-18 NOTE — Telephone Encounter (Signed)
I spoke with patient. She reports palpitations are the same as when she saw Edd Fabian, PA recently.  She has received monitor but is concerned about on going palpitations.  I tried to explain to her that monitor was best option to evaluate her heart rate and palpitations.   I advised her if she feels she needs to be evaluated acutely she should go to the ED

## 2023-02-19 ENCOUNTER — Encounter: Payer: Self-pay | Admitting: Family Medicine

## 2023-02-19 ENCOUNTER — Other Ambulatory Visit: Payer: Self-pay

## 2023-02-19 ENCOUNTER — Ambulatory Visit (INDEPENDENT_AMBULATORY_CARE_PROVIDER_SITE_OTHER): Payer: Medicaid Other | Admitting: Family Medicine

## 2023-02-19 VITALS — BP 108/64 | HR 79 | Ht 64.0 in | Wt 216.4 lb

## 2023-02-19 DIAGNOSIS — N898 Other specified noninflammatory disorders of vagina: Secondary | ICD-10-CM

## 2023-02-19 MED ORDER — METRONIDAZOLE 500 MG PO TABS: 500.0000 mg | ORAL_TABLET | Freq: Two times a day (BID) | ORAL | 1 refills | Status: AC

## 2023-02-19 NOTE — Patient Instructions (Signed)
I have sent in the Rx for oral flagyl tablets. If symptoms do not totally resolve, please let us know.

## 2023-02-20 NOTE — Progress Notes (Signed)
    CHIEF COMPLAINT / HPI: 1 week of increased vaginal discharge.  Thick.  No itching.  Has a fishy smell.  Seems like the symptoms she has had in the past with bacterial vaginosis.  Does not get this often, maybe 2 or 3 times a year.   PERTINENT  PMH / PSH: I have reviewed the patient's medications, allergies, past medical and surgical history, smoking status and updated in the EMR as appropriate.   OBJECTIVE:  BP 108/64   Pulse 79   Ht 5\' 4"  (1.626 m)   Wt 216 lb 6.4 oz (98.2 kg)   LMP 06/26/2022   SpO2 99%   BMI 37.14 kg/m  GENERAL: Well-developed female no acute distress  ASSESSMENT / PLAN:  Bacterial vaginosis: Due to lab issues today we will not be able to perform wet prep but do not really think we need it.  This sounds like classic BV and is consistent with symptoms she has had in the past.  We discussed.  Will treat with Flagyl orally which is her preference.  If not totally resolved after treatment, she will return to clinic.  We also discussed prophylaxis such as boric acid vaginal tablets which she can buy from [employer].  Does not sound like she is having a significant number of episodes per year now but for future notice, this was discussed.  Also gave her a refill in case this returns. No problem-specific Assessment & Plan notes found for this encounter.   Denny Levy MD

## 2023-03-12 ENCOUNTER — Other Ambulatory Visit: Payer: Self-pay

## 2023-03-12 DIAGNOSIS — R0989 Other specified symptoms and signs involving the circulatory and respiratory systems: Secondary | ICD-10-CM

## 2023-03-13 MED ORDER — FLUTICASONE PROPIONATE 50 MCG/ACT NA SUSP
2.0000 | Freq: Every day | NASAL | 0 refills | Status: DC | PRN
Start: 2023-03-13 — End: 2023-05-05

## 2023-03-26 DIAGNOSIS — R002 Palpitations: Secondary | ICD-10-CM

## 2023-03-31 ENCOUNTER — Encounter: Payer: Self-pay | Admitting: Student

## 2023-03-31 ENCOUNTER — Ambulatory Visit: Payer: Medicaid Other | Admitting: Student

## 2023-03-31 VITALS — BP 111/67 | HR 72 | Ht 64.0 in | Wt 214.0 lb

## 2023-03-31 DIAGNOSIS — Z111 Encounter for screening for respiratory tuberculosis: Secondary | ICD-10-CM | POA: Diagnosis not present

## 2023-03-31 DIAGNOSIS — Z0000 Encounter for general adult medical examination without abnormal findings: Secondary | ICD-10-CM | POA: Diagnosis not present

## 2023-03-31 NOTE — Patient Instructions (Signed)
You have your TB appointment listed below for the reading.  Future Appointments  Date Time Provider Department Center  04/02/2023  3:00 PM [facility] NURSE [facility]  04/18/2023  3:35 PM Cleaver, Thomasene Ripple, NP [facility] None    Please arrive 15 minutes before your appointment to ensure smooth check in process.    Please call the clinic at [phone] if your symptoms worsen or you have any concerns.  Thank you for allowing me to participate in your care, Dr. [name] [facility]

## 2023-03-31 NOTE — Progress Notes (Signed)
Patient is here for a PPD placement.  PPD placed in right forearm @ 1105 am.  Patient will return 04/02/2023 to have PPD read. Veronda Prude, RN

## 2023-03-31 NOTE — Progress Notes (Signed)
    SUBJECTIVE:   CHIEF COMPLAINT / HPI:   Breanna Miranda is a 37 y.o. female  presenting for annual physical, TB placement and flu vaccination.  She is not having any current issues or problems that she would like to talk about today.  She is up-to-date with her Pap smear.  Declines COVID shot  PERTINENT  PMH / PSH: Reviewed and updated   OBJECTIVE:   BP 111/67   Pulse 72   Ht 5\' 4"  (1.626 m)   Wt 214 lb (97.1 kg)   LMP 06/26/2022   SpO2 100%   BMI 36.73 kg/m   Well-appearing, no acute distress Cardio: Regular rate, regular rhythm, no murmurs on exam. Pulm: Clear, no wheezing, no crackles. No increased work of breathing Abdominal: bowel sounds present, soft, non-tender, non-distended Extremities: no peripheral edema      03/31/2023   10:14 AM 02/19/2023    3:39 PM 12/13/2022    1:56 PM  PHQ9 SCORE ONLY  PHQ-9 Total Score 13 7 6       ASSESSMENT/PLAN:   No problem-specific Assessment & Plan notes found for this encounter.   Health maintenance: Flu shot given today PPD placed today, nurse visit scheduled for read Pap smear up-to-date [name] COVID shot  [name], DO [facility]

## 2023-04-02 ENCOUNTER — Ambulatory Visit: Payer: Medicaid Other

## 2023-04-02 LAB — TB SKIN TEST
Induration: 0 mm
TB Skin Test: NEGATIVE

## 2023-04-02 NOTE — Progress Notes (Signed)
Patient is here for a PPD read.  It was placed on 03/31/23 in the r forearm @ 11:05 am.    PPD RESULTS:  Result: negative Induration: 0 mm  Letter created and given to patient for documentation purposes. Jone Baseman, CMA

## 2023-04-09 NOTE — Progress Notes (Signed)
Cardiology Clinic Note   Patient Name: Breanna Miranda Date of Encounter: 04/18/2023  Primary Care Provider:  Penne Lash, MD Primary Cardiologist:  Lance Muss, MD  Patient Profile    Breanna Miranda 36 year old female presents the clinic today for follow-up evaluation of her palpitations.  Past Medical History    Past Medical History:  Diagnosis Date   Anemia    Anxiety    Asthma    Blood transfusion without reported diagnosis 2016   Chiari I malformation (HCC)    Complication of anesthesia    Patient states he heart beat decreases, shallow breathing   COVID 07/14/2020   Depression    Dyspnea    Empty sella (HCC)    Family history of adverse reaction to anesthesia    mother heart beat decreases   GERD (gastroesophageal reflux disease)    Irregular heart rhythm    Low grade squamous intraepith lesion on cytologic smear cervix (lgsil) 01/15/2018   Menorrhagia    Past Surgical History:  Procedure Laterality Date   BREAST BIOPSY Left 12/2020   CHOLECYSTECTOMY     TUBAL LIGATION     VAGINAL HYSTERECTOMY Bilateral 09/17/2022   Procedure: VAGINAL HYSTERECTOMY WITH BILATERAL SALPINGECTOMY;  Surgeon: Hermina Staggers, MD;  Location: [facility] OR;  Service: Gynecology;  Laterality: Bilateral;    Allergies  Allergies  Allergen Reactions   Penicillins Anaphylaxis, Hives and Itching    Has patient had a PCN reaction causing immediate rash, facial/tongue/throat swelling, SOB or lightheadedness with hypotension: Yes Has patient had a PCN reaction causing severe rash involving mucus membranes or skin necrosis: No Has patient had a PCN reaction that required hospitalization No Has patient had a PCN reaction occurring within the last 10 years: Yes If all of the above answers are "NO", then may proceed with Cephalosporin use.    Shrimp [Shellfish Allergy] Anaphylaxis, Hives and Itching    History of Present Illness    Breanna Miranda has a PMH of asthma,  depression, dyspnea, GERD, palpitations, anxiety, and anemia.  She wore a cardiac event monitor in 2016 which was reassuring.  She was seen by Dr.Varanasi 05/31/2022.  During that time she noted a several hour episode of fast heart rate and brief episodes of palpitations.  She previously had blood transfusions and IV iron due to heavy bleeding from menstruation.  She denied chest pain, dizziness, lower extremity swelling, orthopnea.  It was felt that her chest soreness was atypical for ischemia.  Echocardiogram was ordered and showed normal LVEF and no significant valvular abnormalities.  She wore a cardiac event monitor 07/09/2022 which showed normal sinus rhythm with rare PACs and rare PVCs no sinus arrhythmia no atrial fibrillation.  She presented to the emergency department on 02/13/2023 and was discharged on 02/14/2023.  She reported chest pain.  She also noted that she had been having increased palpitations over the past 1-2 days.  She described her episodes as a flutter type palpitations that was on and off.  At the time of exam she reported her palpitations were constant.  She also noted some chest pressure that had been brief.  She denied shortness of breath and lower extremity swelling.  Her EKG showed sinus rhythm with first-degree AV block.  She was noted to have occasional PACs.  Her high-sensitivity troponin was noted to be 3 and 3 and her magnesium was 1.9.  Her BMP was unremarkable.  Her CBC was stable.  She presented to the clinic 02/14/23 for  follow-up evaluation and stated she presented to the emergency department because she had continued palpitations.  We reviewed her lab work and EKG.  We reviewed telemetry report.  We also reviewed her previous cardiac event monitor and echocardiogram.  She expressed understanding.  We reviewed triggers for palpitations.  She reported that she did not sleep well at night and was tired through the day.  She also reported snoring.  Her STOP-BANG score was 4.  I  will ordered a home sleep study, 14-day cardiac event monitor, and planned follow-up in 2 to 3 months.  Her cardiac event monitor showed minimum heart rate of 55, maximum heart rate of 148 and average heart rate of 82 bpm.  She was noted to have rare isolated PACs and PVCs with predominantly normal sinus rhythm.  No arrhythmias were noted.  Sleep study not yet resulted.  She presents to the clinic today for follow-up evaluation and states she has been exercising more and reduced her calories to around 1200/day.  She does note some periods of dizziness throughout the week.  She reports maintaining adequate p.o. hydration.  We reviewed her cardiac event monitor.  She reports that she was not able to keep the monitor on.  She did have PACs PVCs and SVT.  We reviewed vagal maneuvers.  She expressed understanding.  I will order a BMP, have her increase her calories in diet, continue her current medical therapy, and plan follow-up in 6 to 9 months.  Today she denies chest pain, shortness of breath, lower extremity edema, fatigue,  melena, hematuria, hemoptysis, diaphoresis, weakness, presyncope, syncope, orthopnea, and PND.     Home Medications    Prior to Admission medications   Medication Sig Start Date End Date Taking? Authorizing Provider  acetaminophen (TYLENOL) 500 MG tablet Take 1,000 mg by mouth every 6 (six) hours as needed for mild pain.    [provider]  cetirizine (ZYRTEC) 10 MG tablet Take 1 tablet (10 mg total) by mouth daily. 11/07/22   [name], MD  Cyanocobalamin (VITAMIN B-12 PO) Take by mouth.    [provider]  diclofenac Sodium (VOLTAREN) 1 % GEL Apply 1 Application topically 2 (two) times daily as needed (pain). 04/08/22   [provider]  famotidine (PEPCID) 20 MG tablet TAKE 1 TABLET(20 MG) BY MOUTH TWICE DAILY 12/13/22   Cora Collum, DO  ferrous sulfate 325 (65 FE) MG tablet Take 325 mg by mouth daily with breakfast.    [provider]  fluticasone (FLONASE) 50 MCG/ACT nasal spray SHAKE LIQUID AND USE 2 SPRAYS IN EACH NOSTRIL DAILY Patient taking differently: Place 2 sprays into both nostrils daily as needed for allergies. 03/08/22   Cora Collum, DO  ibuprofen (ADVIL) 800 MG tablet Take 1 tablet (800 mg total) by mouth every 8 (eight) hours. 10/25/22   Hermina Staggers, MD  Multiple Vitamins-Minerals (ZINC PO) Take 1 tablet by mouth in the morning.    [provider]  ondansetron (ZOFRAN-ODT) 4 MG disintegrating tablet Take 1 tablet (4 mg total) by mouth every 8 (eight) hours as needed for nausea. 12/13/22   Sabino Dick, DO  pantoprazole (PROTONIX) 40 MG tablet TAKE 1 TABLET(40 MG) BY MOUTH DAILY Strength: 40 mg 02/05/23   Baloch, Mahnoor, MD  montelukast (SINGULAIR) 10 MG tablet Take 1 tablet (10 mg total) by mouth at bedtime. 05/12/18 08/02/19  Doreene Eland, MD    Family History    Family History  Problem Relation Age  of Onset   Diabetes Father    Kidney disease Father        failure due to diabetes   Hypertension Mother    Heart attack Maternal Grandmother 52   She indicated that her mother is alive. She indicated that her father is deceased. She indicated that all of her four sisters are alive. She indicated that her brother is deceased. She indicated that her maternal grandmother is deceased. She indicated that both of her daughters are alive. She indicated that both of her sons are alive.  Social History    Social History   Socioeconomic History   Marital status: Married    Spouse name: Not on file   Number of children: Not on file   Years of education: Not on file   Highest education level: Not on file  Occupational History   Not on file  Tobacco Use   Smoking status: Former    Current packs/day: 0.00    Average packs/day: 0.1 packs/day for 2.0 years (0.2 ttl pk-yrs)    Types: Cigarettes    Start date: 05/24/2005    Quit date: 05/25/2007    Years since quitting: 15.9    Smokeless tobacco: Never  Vaping Use   Vaping status: Never Used  Substance and Sexual Activity   Alcohol use: Yes    Alcohol/week: 1.0 standard drink of alcohol    Types: 1 Glasses of wine per week    Comment: occaional   Drug use: No   Sexual activity: Yes    Birth control/protection: Surgical  Other Topics Concern   Not on file  Social History Narrative   Are you right handed or left handed? left   Are you currently employed ?    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   Who lives with you? Husband and two children   What type of home do you live in: 1 story or 2 story? two    Caffeine none   Social Determinants of Health   Financial Resource Strain: Not on file  Food Insecurity: Not on file  Transportation Needs: Not on file  Physical Activity: Not on file  Stress: Not on file  Social Connections: Not on file  Intimate Partner Violence: Not on file     Review of Systems    General:  No chills, fever, night sweats or weight changes.  Cardiovascular:  No chest pain, dyspnea on exertion, edema, orthopnea, palpitations, paroxysmal nocturnal dyspnea. Dermatological: No rash, lesions/masses Respiratory: No cough, dyspnea Urologic: No hematuria, dysuria Abdominal:   No nausea, vomiting, diarrhea, bright red blood per rectum, melena, or hematemesis Neurologic:  No visual changes, wkns, changes in mental status.  Dizziness All other systems reviewed and are otherwise negative except as noted above.  Physical Exam    VS:  BP 92/60   Pulse 76   Ht 5\' 4"  (1.626 m)   Wt 215 lb (97.5 kg)   LMP 06/26/2022   SpO2 100%   BMI 36.90 kg/m  , BMI Body mass index is 36.9 kg/m. GEN: Well nourished, well developed, in no acute distress. HEENT: normal. Neck: Supple, no JVD, carotid bruits, or masses. Cardiac: RRR, no murmurs, rubs, or gallops. No clubbing, cyanosis, edema.  Radials/DP/PT 2+ and equal bilaterally.  Respiratory:   Respirations regular and unlabored, clear to auscultation bilaterally. GI: Soft, nontender, nondistended, BS + x 4. MS: no deformity or atrophy. Skin: warm and dry, no rash. Neuro:  Strength and sensation are intact. Psych: Normal affect.  Accessory Clinical Findings    Recent Labs: 12/13/2022: ALT 13; TSH 1.470 02/13/2023: BUN 9; Creatinine, Ser 0.83; Hemoglobin 12.3; Platelets 249; Potassium 3.9; Sodium 137 02/14/2023: Magnesium 1.9   Recent Lipid Panel    Component Value Date/Time   CHOL 185 05/21/2021 1655   TRIG 340 (H) 05/21/2021 1655   HDL 61 05/21/2021 1655   CHOLHDL 3.0 05/21/2021 1655   CHOLHDL 2.5 08/16/2016 0452   VLDL 16 08/16/2016 0452   LDLCALC 71 05/21/2021 1655         ECG personally reviewed by me today-EKG Interpretation Date/Time:  Friday April 18 2023 16:00:47 EDT Ventricular Rate:  76 PR Interval:  180 QRS Duration:  70 QT Interval:  376 QTC Calculation: 423 R Axis:   36  Text Interpretation: Normal sinus rhythm Normal ECG When compared with ECG of 13-Feb-2023 22:46, PR interval has decreased Confirmed by Edd Fabian [phone]) on 04/18/2023 4:27:20 PM   Echocardiogram 06/28/2022  IMPRESSIONS     1. Left ventricular ejection fraction, by estimation, is 60 to 65%. The  left ventricle has normal function. The left ventricle has no regional  wall motion abnormalities. Left ventricular diastolic parameters were  normal.   2. Right ventricular systolic function is normal. The right ventricular  size is normal. Tricuspid regurgitation signal is inadequate for assessing  PA pressure.   3. The mitral valve is normal in structure. No evidence of mitral valve  regurgitation. No evidence of mitral stenosis.   4. The aortic valve is normal in structure. Aortic valve regurgitation is  not visualized. No aortic stenosis is present.   5. The inferior vena cava is normal in size with greater than 50%  respiratory variability, suggesting right atrial  pressure of 3 mmHg.   FINDINGS   Left Ventricle: Left ventricular ejection fraction, by estimation, is 60  to 65%. The left ventricle has normal function. The left ventricle has no  regional wall motion abnormalities. 3D left ventricular ejection fraction  analysis performed but not  reported based on interpreter judgement due to suboptimal tracking. The  left ventricular internal cavity size was normal in size. There is no left  ventricular hypertrophy. Left ventricular diastolic parameters were  normal. Normal left ventricular filling   pressure.   Right Ventricle: The right ventricular size is normal. No increase in  right ventricular wall thickness. Right ventricular systolic function is  normal. Tricuspid regurgitation signal is inadequate for assessing PA  pressure.   Left Atrium: Left atrial size was normal in size.   Right Atrium: Right atrial size was normal in size.   Pericardium: There is no evidence of pericardial effusion.   Mitral Valve: The mitral valve is normal in structure. No evidence of  mitral valve regurgitation. No evidence of mitral valve stenosis.   Tricuspid Valve: The tricuspid valve is normal in structure. Tricuspid  valve regurgitation is trivial. No evidence of tricuspid stenosis.   Aortic Valve: The aortic valve is normal in structure. Aortic valve  regurgitation is not visualized. No aortic stenosis is present.   Pulmonic Valve: The pulmonic valve was normal in structure. Pulmonic valve  regurgitation is not visualized. No evidence of pulmonic stenosis.   Aorta: The aortic root is normal in size and structure.   Venous: The inferior vena cava is normal in size with greater than 50%  respiratory variability, suggesting right atrial pressure of 3 mmHg.   IAS/Shunts: No atrial level shunt detected by  color flow Doppler.    Cardiac event monitor 07/09/2022    Normal sinus rhythm with rare PACs and rare PVCs.   Symptoms correlated to PACs.    No sustained arrhythmias.   No atrial fibrillation.     Patch Wear Time:  4 days and 10 hours (2023-12-17T10:07:40-0500 to 2023-12-21T20:47:22-0500)   Patient had a min HR of 59 bpm, max HR of 148 bpm, and avg HR of 87 bpm. Predominant underlying rhythm was Sinus Rhythm. Isolated SVEs were rare (<1.0%), SVE Triplets were rare (<1.0%), and no SVE Couplets were present. Isolated VEs were rare (<1.0%),  and no VE Couplets or VE Triplets were present.  Cardiac event monitor 04/08/2023     Patient had a minimum heart rate of 55 bpm, maximum heart rate of 148 bpm, and average heart rate of 82 bpm.   Predominant underlying rhythm was sinus rhythm.   One short run of SVT (5 beats).   Isolated PACs were rare (<1.0%).   Isolated PVCs were rare (<1.0%).   No symptoms.   No malignant arrhythmias.  Assessment & Plan   1.  Palpitations-heart rate today 76.  Cardiac event monitor showed rare PACs and PVCs with no significant arrhythmias.  Details above. Avoid triggers caffeine, chocolate, EtOH, dehydration etc.-reviewed Maintain physical activity Vagal maneuvers  Dizziness- EKG today shows normal sinus rhythm 76 bpm.  Last thyroid and magnesium levels within normal limits.  Appears to be related to calorie restricted diet. Increase diet calories Increase p.o. hydration Order  BMP,    Chest pain of uncertain etiology-denies further episodes. Maintain physical activity No plans for ischemic evaluation  Daytime somnolence-reports snoring.  STOP-BANG score 4 Home sleep study-reordered Sleep hygiene instructions given  Obesity-weight today 215. Heart healthy low-sodium diet-continue/reviewed Maintain physical activity Continue weight loss  Disposition: Follow-up with Dr.Varanasi or me in 6-9 months.   Thomasene Ripple. Krayton Wortley NP-C     04/18/2023, 4:26 PM [facility] [address] Office [phone] Fax [phone]    I spent 14 minutes  examining this patient, reviewing medications, and using patient centered shared decision making involving her cardiac care.  Prior to her visit I spent greater than 20 minutes reviewing her past medical history,  medications, and prior cardiac tests.

## 2023-04-18 ENCOUNTER — Encounter: Payer: Self-pay | Admitting: General Practice

## 2023-04-18 ENCOUNTER — Ambulatory Visit: Payer: Medicaid Other | Attending: General Practice | Admitting: General Practice

## 2023-04-18 VITALS — BP 92/60 | HR 76 | Ht 64.0 in | Wt 215.0 lb

## 2023-04-18 DIAGNOSIS — R079 Chest pain, unspecified: Secondary | ICD-10-CM | POA: Diagnosis not present

## 2023-04-18 DIAGNOSIS — E669 Obesity, unspecified: Secondary | ICD-10-CM

## 2023-04-18 DIAGNOSIS — R002 Palpitations: Secondary | ICD-10-CM | POA: Diagnosis not present

## 2023-04-18 DIAGNOSIS — R400 Somnolence: Secondary | ICD-10-CM | POA: Diagnosis not present

## 2023-04-18 DIAGNOSIS — R42 Dizziness and giddiness: Secondary | ICD-10-CM | POA: Diagnosis not present

## 2023-04-18 NOTE — Patient Instructions (Signed)
Medication Instructions:  The current medical regimen is effective;  continue present plan and medications as directed. Please refer to the Current Medication list given to you today.  *If you need a refill on your cardiac medications before your next appointment, please call your pharmacy*  Lab Work: BMET TODAY If you have labs (blood work) drawn today and your tests are completely normal, you will receive your results only by:   MyChart Message (if you have MyChart) OR  A paper copy in the mail If you have any lab test that is abnormal or we need to change your treatment, we will call you to review the results.  Other Instructions MAINTAIN HYDRATION  INCREASE CALORIES IN YOUR DIET 1200 CALORIES DAILY x3 DAYS;  THAN 15-1800 CALORIES x4 DAYS. PLEASE READ AND PERFORM VAGAL MANEUVERS-ATTACHED  Follow-Up: At [facility], you and your health needs are our priority.  As part of our continuing mission to provide you with exceptional heart care, we have created designated Provider Care Teams.  These Care Teams include your primary Cardiologist (physician) and Advanced Practice Providers (APPs -  Physician Assistants and Nurse Practitioners) who all work together to provide you with the care you need, when you need it.  Your next appointment:   6-9 month(s)  Provider:   Lance Muss, MD  Edd Fabian, FNP HERE AT [facility]         Vagal manoeuvres include: 1-Bearing down. Bearing down means that you try to breathe out with your stomach muscles but you don't let air out of your nose or mouth. 2-Putting an ice-cold, wet towel on your face. 3-Coughing or gagging.  4-Valsalva maneuver

## 2023-04-19 LAB — BASIC METABOLIC PANEL
BUN/Creatinine Ratio: 17 (ref 9–23)
BUN: 13 mg/dL (ref 6–20)
CO2: 24 mmol/L (ref 20–29)
Calcium: 9.2 mg/dL (ref 8.7–10.2)
Chloride: 104 mmol/L (ref 96–106)
Creatinine, Ser: 0.77 mg/dL (ref 0.57–1.00)
Glucose: 89 mg/dL (ref 70–99)
Potassium: 4.2 mmol/L (ref 3.5–5.2)
Sodium: 139 mmol/L (ref 134–144)
eGFR: 101 mL/min/{1.73_m2} (ref >59)

## 2023-05-05 ENCOUNTER — Ambulatory Visit: Payer: Medicaid Other | Admitting: Podiatry

## 2023-05-05 ENCOUNTER — Other Ambulatory Visit: Payer: Self-pay | Admitting: Family Medicine

## 2023-05-05 ENCOUNTER — Encounter: Payer: Self-pay | Admitting: Podiatry

## 2023-05-05 VITALS — Ht 64.0 in | Wt 215.0 lb

## 2023-05-05 DIAGNOSIS — M7751 Other enthesopathy of right foot: Secondary | ICD-10-CM | POA: Diagnosis not present

## 2023-05-05 DIAGNOSIS — L509 Urticaria, unspecified: Secondary | ICD-10-CM

## 2023-05-05 DIAGNOSIS — M779 Enthesopathy, unspecified: Secondary | ICD-10-CM

## 2023-05-05 DIAGNOSIS — M7752 Other enthesopathy of left foot: Secondary | ICD-10-CM | POA: Diagnosis not present

## 2023-05-05 MED ORDER — PANTOPRAZOLE SODIUM 40 MG PO TBEC
DELAYED_RELEASE_TABLET | ORAL | 2 refills | Status: DC
Start: 2023-05-05 — End: 2023-06-22

## 2023-05-05 MED ORDER — TRIAMCINOLONE ACETONIDE 10 MG/ML IJ SUSP
10.0000 mg | Freq: Once | INTRAMUSCULAR | Status: AC
Start: 2023-05-05 — End: 2023-05-05
  Administered 2023-05-05: 10 mg via INTRA_ARTICULAR

## 2023-05-05 MED ORDER — FLUTICASONE PROPIONATE 50 MCG/ACT NA SUSP
2.0000 | Freq: Every day | NASAL | 3 refills | Status: DC
Start: 2023-05-05 — End: 2023-06-22

## 2023-05-05 MED ORDER — FLUTICASONE PROPIONATE 50 MCG/ACT NA SUSP
2.0000 | Freq: Every day | NASAL | 3 refills | Status: DC
Start: 2023-05-05 — End: 2023-05-05

## 2023-05-05 NOTE — Addendum Note (Signed)
Addended byPenne Lash on: 05/05/2023 04:53 PM   Modules accepted: Orders

## 2023-05-05 NOTE — Progress Notes (Deleted)
NEUROLOGY FOLLOW UP OFFICE NOTE  Breanna Miranda [id number]  Subjective:  Breanna Miranda is a 37 y.o. year old left-handed female with a medical history of first degree AV block, GERD, depression, iron deficiency anemia, fibroids who we last saw on 09/13/22 for diffuse pain, including numbness and tingling in arms and legs bilaterally.  To briefly review: Patient started megace in 07/2022 for fibroids. She will be having a hysterectomy next week. About 1 week after starting it, it caused achiness of arms then feet. She gets frequent cramps. She mentioned this to her doctors, but this was not a known side effect and felt to be unrelated to symptoms. She stopped taking the medication for about 3 days, but did not change symptoms. She now has tingling in both shoulders into both hands. Initially the left arm was worse but now the right. She has tingling in her feet as well. She has severe cramps where her toes will curl and get stuck. If she is using her hands, they will cramp up. She denies neck or back pain. She denies any changes to bowel or bladder. She denies imbalance or falls.   She has not had significant symptoms until 07/2022 including vision changes, numbness, tingling, or weakness.   Patient has been prescribed Lyrica 50 mg BID. She has been on this 3-4 weeks and has not noticed a difference.   Patient has been told she may have carpal tunnel in the past and responded to splinting. Her current symptoms are significantly different per patient.   She does not report any constitutional symptoms like fever, night sweats, anorexia or unintentional weight loss. She has gained 17 pounds since starting megace.   EtOH use: Very rare wine  Restrictive diet? No Family history of neuropathy/myopathy/neurologic disease? Mother has fibromyalgia, whose symptoms are similar to patient  Patient has never had an EMG.  Most recent Assessment and Plan (09/13/22): Breanna Miranda is a 37  y.o. female who presents for evaluation of diffuse pain with numbness and tingling in arms and legs bilaterally. She has a relevant medical history of first degree AV block, GERD, depression, iron deficiency anemia, fibroids. Her neurological examination is essentially normal. She does have diffuse pain to palpation of all extremities, most in the RUE, at joints and muscles but without weakness or diminished sensation. Available diagnostic data is significant for B12 of 403, chronic anemia on CBC, and unremarkable BMP. The etiology of patient's symptoms is currently unclear. While she mentions a possible history of carpal tunnel, her current symptoms are much different by her report. She has more achiness and symptoms are from bilateral shoulders and in both legs. She relates that her symptoms started soon after starting Megace. I do not see these symptoms as known side effects of the medication though. Her symptoms sound more rheumatologic or like fibromyalgia, which her mother also has (with similar symptoms to patient). I will get lab work today to look for treatable causes. We discussed EMG, but given that it is thought to be low yield, will defer for now. She is not benefiting from Lyrica, so we will stop this and try Cymbalta.   PLAN: -Blood work: vit D, PTH, ionized Ca, ANA, ENA, ESR, CRP, CK -Will taper off Lyrica as she does not feel benefit:             -Reduce to 25 mg BID today for 1 week, then             -  Reduce to 25 mg daily for 1 week, then stop -Start Cymbalta 30 mg qhs for 1 week, then increase to 60 mg qhs  Since their last visit: Vitamin D level was low, for which I recommended supplementation with 1000 international units daily. *** ESR was also elevated to 60. Labs were otherwise unremarkable.  *** Symptoms responding to Cymbalta?***  MEDICATIONS:  Outpatient Encounter Medications as of 05/09/2023  Medication Sig   acetaminophen (TYLENOL) 500 MG tablet Take 1,000 mg by mouth  every 6 (six) hours as needed for mild pain.   cetirizine (ZYRTEC) 10 MG tablet Take 1 tablet (10 mg total) by mouth daily.   Cyanocobalamin (VITAMIN B-12 PO) Take by mouth.   diclofenac Sodium (VOLTAREN) 1 % GEL Apply 1 Application topically 2 (two) times daily as needed (pain).   famotidine (PEPCID) 20 MG tablet TAKE 1 TABLET(20 MG) BY MOUTH TWICE DAILY   ferrous sulfate 325 (65 FE) MG tablet Take 325 mg by mouth daily with breakfast.   fluticasone (FLONASE) 50 MCG/ACT nasal spray Place 2 sprays into both nostrils daily as needed for allergies.   ibuprofen (ADVIL) 800 MG tablet Take 1 tablet (800 mg total) by mouth every 8 (eight) hours.   Multiple Vitamins-Minerals (ZINC PO) Take 1 tablet by mouth in the morning.   ondansetron (ZOFRAN-ODT) 4 MG disintegrating tablet Take 1 tablet (4 mg total) by mouth every 8 (eight) hours as needed for nausea.   pantoprazole (PROTONIX) 40 MG tablet TAKE 1 TABLET(40 MG) BY MOUTH DAILY Strength: 40 mg   [DISCONTINUED] montelukast (SINGULAIR) 10 MG tablet Take 1 tablet (10 mg total) by mouth at bedtime.   No facility-administered encounter medications on file as of 05/09/2023.    PAST MEDICAL HISTORY: Past Medical History:  Diagnosis Date   Anemia    Anxiety    Asthma    Blood transfusion without reported diagnosis 2016   Chiari I malformation (HCC)    Complication of anesthesia    Patient states he heart beat decreases, shallow breathing   COVID 07/14/2020   Depression    Dyspnea    Empty sella (HCC)    Family history of adverse reaction to anesthesia    mother heart beat decreases   GERD (gastroesophageal reflux disease)    Irregular heart rhythm    Low grade squamous intraepith lesion on cytologic smear cervix (lgsil) 01/15/2018   Menorrhagia     PAST SURGICAL HISTORY: Past Surgical History:  Procedure Laterality Date   BREAST BIOPSY Left 12/2020   CHOLECYSTECTOMY     TUBAL LIGATION     VAGINAL HYSTERECTOMY Bilateral 09/17/2022    Procedure: VAGINAL HYSTERECTOMY WITH BILATERAL SALPINGECTOMY;  Surgeon: Hermina Staggers, MD;  Location: [facility] OR;  Service: Gynecology;  Laterality: Bilateral;    ALLERGIES: Allergies  Allergen Reactions   Penicillins Anaphylaxis, Hives and Itching    Has patient had a PCN reaction causing immediate rash, facial/tongue/throat swelling, SOB or lightheadedness with hypotension: Yes Has patient had a PCN reaction causing severe rash involving mucus membranes or skin necrosis: No Has patient had a PCN reaction that required hospitalization No Has patient had a PCN reaction occurring within the last 10 years: Yes If all of the above answers are "NO", then may proceed with Cephalosporin use.    Shrimp [Shellfish Allergy] Anaphylaxis, Hives and Itching    FAMILY HISTORY: Family History  Problem Relation Age of Onset   Diabetes Father    Kidney disease Father  failure due to diabetes   Hypertension Mother    Heart attack Maternal Grandmother 48    SOCIAL HISTORY: Social History   Tobacco Use   Smoking status: Former    Current packs/day: 0.00    Average packs/day: 0.1 packs/day for 2.0 years (0.2 ttl pk-yrs)    Types: Cigarettes    Start date: 05/24/2005    Quit date: 05/25/2007    Years since quitting: 15.9   Smokeless tobacco: Never  Vaping Use   Vaping status: Never Used  Substance Use Topics   Alcohol use: Yes    Alcohol/week: 1.0 standard drink of alcohol    Types: 1 Glasses of wine per week    Comment: occaional   Drug use: No   Social History   Social History Narrative   Are you right handed or left handed? left   Are you currently employed ?    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   Who lives with you? Husband and two children   What type of home do you live in: 1 story or 2 story? two    Caffeine none      Objective:  Vital Signs:  LMP 06/26/2022   ***  Labs and Imaging review: New results: CBC (02/13/23):  significant for MCV of 76.9  12/13/22: TSH wnl CMP unremarkable  09/13/22: ESR 60 Vit D 11.26 CK 171 CRP < 1.0 ANA and ENA negative Ionized Ca wnl PTH wnl  09/18/22: CBC significant for Hb 9.7 (chronic), MCV 64.4 BMP unremarkable  Previously reviewed results: TSH (08/02/22): 1.62 B12 (08/02/22): 409 CBC (08/02/22): Hb 9.4 (chronic), MCV 69 BMP (09/12/22): unremarkable   CT cervical spine wo contrast (01/22/21): FINDINGS: Alignment: Mild nonspecific reversal of the expected cervical lordosis. No significant spondylolisthesis.   Skull base and vertebrae: The basion-dental and atlanto-dental intervals are maintained.No evidence of acute fracture to the cervical spine.   Soft tissues and spinal canal: No prevertebral fluid or swelling. No visible canal hematoma.   Disc levels: Cervical spondylosis. Most notably at C5-C6, there is mild-to-moderate disc space narrowing with a disc bulge and uncovertebral hypertrophy on the left. No appreciable high-grade spinal canal stenosis. No significant bony neural foraminal narrowing. C5-C6 ventral osteophyte.   Upper chest: No consolidation within the imaged lung apices. No visible pneumothorax.   Other: Incidentally noted, the right first rib appears to also articulate with the right C7 transverse process.   IMPRESSION: No evidence of acute fracture to the cervical spine.   Mild nonspecific reversal of the expected cervical lordosis.   Cervical spondylosis, as described and greatest at C5-C6.  Assessment/Plan:  This is Breanna Miranda, a 37 y.o. female with: ***   Plan: *** -Cymbalta*** ***?EMG  Return to clinic in ***  Total time spent reviewing records, interview, history/exam, documentation, and coordination of care on day of encounter:  *** min  Jacquelyne Balint, MD

## 2023-05-06 ENCOUNTER — Ambulatory Visit: Payer: Medicaid Other

## 2023-05-06 ENCOUNTER — Other Ambulatory Visit (HOSPITAL_COMMUNITY)
Admission: RE | Admit: 2023-05-06 | Discharge: 2023-05-06 | Disposition: A | Discharge status: Home / Self Care | Payer: Medicaid Other | Source: Ambulatory Visit | Attending: Obstetrics and Gynecology | Admitting: Obstetrics and Gynecology

## 2023-05-06 VITALS — BP 102/68 | HR 76 | Wt 211.0 lb

## 2023-05-06 DIAGNOSIS — N898 Other specified noninflammatory disorders of vagina: Secondary | ICD-10-CM | POA: Insufficient documentation

## 2023-05-06 NOTE — Progress Notes (Signed)
SUBJECTIVE:  37 y.o. female complains of brown vaginal discharge and odor for 1 month(s). Denies abnormal vaginal bleeding or significant pelvic pain or fever. No UTI symptoms. Denies history of known exposure to STD.  Patient's last menstrual period was 06/26/2022.  OBJECTIVE:  She appears well, afebrile. Urine dipstick: not done.  ASSESSMENT:  Vaginal Discharge  Vaginal Odor   PLAN:  GC, chlamydia, trichomonas, BVAG, CVAG probe sent to lab. Treatment: To be determined once lab results are received ROV prn if symptoms persist or worsen.

## 2023-05-06 NOTE — Progress Notes (Signed)
Subjective:   Patient ID: Breanna Miranda, female   DOB: 37 y.o.   MRN: [id number]   HPI Patient states heels been doing well but developed these knots on the side of both feet left over right   ROS      Objective:  Physical Exam  Neurovascular status intact with nodules on the left measuring about 1.5 cm x 1.5 cm and on the right 1 cm x 2 cm that appear to be freely movable subcutaneous tissue     Assessment:  Possibility for an inflammatory tenderness condition also possible ganglion     Plan:  H&P reviewed and went ahead today did sterile block of each foot 60 mg like Marcaine mixture I then did aspirate is able to get out a small amount of gelatinous fluid but I then injected the tendon complex bilateral 3 mg Dexasone Kenalog 5 mg Xylocaine and applied compression.  Reappoint as symptoms indicate may require surgery eventually

## 2023-05-08 LAB — CERVICOVAGINAL ANCILLARY ONLY
Bacterial Vaginitis (gardnerella): POSITIVE — AB
Candida Glabrata: NEGATIVE
Candida Vaginitis: NEGATIVE
Chlamydia: NEGATIVE
Comment: NEGATIVE
Comment: NEGATIVE
Comment: NEGATIVE
Comment: NEGATIVE
Comment: NEGATIVE
Comment: NORMAL
Neisseria Gonorrhea: NEGATIVE
Trichomonas: NEGATIVE

## 2023-05-09 ENCOUNTER — Ambulatory Visit: Payer: Medicaid Other | Admitting: Neurology

## 2023-05-09 MED ORDER — METRONIDAZOLE 500 MG PO TABS: 500.0000 mg | ORAL_TABLET | Freq: Two times a day (BID) | ORAL | 0 refills | Status: DC

## 2023-05-09 NOTE — Addendum Note (Signed)
Addended by: Catalina Antigua on: 05/09/2023 08:50 AM   Modules accepted: Orders

## 2023-06-19 ENCOUNTER — Telehealth: Payer: Self-pay | Admitting: General Practice

## 2023-06-19 ENCOUNTER — Other Ambulatory Visit: Payer: Self-pay

## 2023-06-19 ENCOUNTER — Encounter (HOSPITAL_COMMUNITY): Payer: Self-pay

## 2023-06-19 ENCOUNTER — Emergency Department (HOSPITAL_COMMUNITY)
Admission: EM | Admit: 2023-06-19 | Discharge: 2023-06-20 | Disposition: A | Discharge status: Home / Self Care | Payer: Medicaid Other | Attending: Emergency Medicine | Admitting: Emergency Medicine

## 2023-06-19 ENCOUNTER — Emergency Department (HOSPITAL_COMMUNITY): Payer: Medicaid Other

## 2023-06-19 DIAGNOSIS — R002 Palpitations: Secondary | ICD-10-CM | POA: Diagnosis not present

## 2023-06-19 DIAGNOSIS — E876 Hypokalemia: Secondary | ICD-10-CM | POA: Diagnosis not present

## 2023-06-19 DIAGNOSIS — R0602 Shortness of breath: Secondary | ICD-10-CM | POA: Diagnosis not present

## 2023-06-19 LAB — CBC
HCT: 36.7 % (ref 36.0–46.0)
Hemoglobin: 11.6 g/dL — ABNORMAL LOW (ref 12.0–15.0)
MCH: 24.7 pg — ABNORMAL LOW (ref 26.0–34.0)
MCHC: 31.6 g/dL (ref 30.0–36.0)
MCV: 78.3 fL — ABNORMAL LOW (ref 80.0–100.0)
Platelets: 217 10*3/uL (ref 150–400)
RBC: 4.69 MIL/uL (ref 3.87–5.11)
RDW: 15.2 % (ref 11.5–15.5)
WBC: 5.8 10*3/uL (ref 4.0–10.5)
nRBC: 0 % (ref 0.0–0.2)

## 2023-06-19 LAB — BASIC METABOLIC PANEL
Anion gap: 8 (ref 5–15)
BUN: 9 mg/dL (ref 6–20)
CO2: 22 mmol/L (ref 22–32)
Calcium: 8.8 mg/dL — ABNORMAL LOW (ref 8.9–10.3)
Chloride: 104 mmol/L (ref 98–111)
Creatinine, Ser: 0.63 mg/dL (ref 0.44–1.00)
GFR, Estimated: >60 mL/min (ref >60)
Glucose, Bld: 124 mg/dL — ABNORMAL HIGH (ref 70–99)
Potassium: 3 mmol/L — ABNORMAL LOW (ref 3.5–5.1)
Sodium: 134 mmol/L — ABNORMAL LOW (ref 135–145)

## 2023-06-19 LAB — TROPONIN I (HIGH SENSITIVITY)
Troponin I (High Sensitivity): <2 ng/L (ref <18)
Troponin I (High Sensitivity): <2 ng/L (ref <18)

## 2023-06-19 MED ORDER — POTASSIUM CHLORIDE CRYS ER 20 MEQ PO TBCR
40.0000 meq | EXTENDED_RELEASE_TABLET | Freq: Once | ORAL | Status: AC
  Administered 2023-06-20: 40 meq via ORAL
  Filled 2023-06-19: qty 2

## 2023-06-19 NOTE — ED Provider Notes (Signed)
[place] EMERGENCY DEPARTMENT AT [facility] Provider Note   CSN: [id number] Arrival date & time: 06/19/23  1806     History  Chief Complaint  Patient presents with   Palpitations    Breanna Miranda is a 37 y.o. female.  The history is provided by the patient.  Palpitations Breanna Miranda is a 37 y.o. female who presents to the Emergency Department complaining of palpitations.  She presents to the ED for evaluation of palpitations that have been intermittent for several years but consistent for two weeks.  It is difficult to describe her sxs but they feel like an extra beat, sometimes fast and sometimes slow.  She also describes pounding and squeezing.  She does not have any chest pain.  No fever cough.  She does get dyspnea on exertion, which has been ongoing for a while.  No abdominal pain, nausea, vomiting, leg swelling.  No known medical problems.   No personal or family hx/o dvt/pe.  Grandmother with CHF.   No hormone. No tobacco, occasional alcohol, no drugs.       Home Medications Prior to Admission medications   Medication Sig Start Date End Date Taking? Authorizing Provider  acetaminophen (TYLENOL) 500 MG tablet Take 1,000 mg by mouth every 6 (six) hours as needed for mild pain.    [provider]  cetirizine (ZYRTEC) 10 MG tablet Take 1 tablet (10 mg total) by mouth daily. 11/07/22   [name], MD  Cyanocobalamin (VITAMIN B-12 PO) Take by mouth.    [provider]  diclofenac Sodium (VOLTAREN) 1 % GEL Apply 1 Application topically 2 (two) times daily as needed (pain). 04/08/22   [provider]  famotidine (PEPCID) 20 MG tablet TAKE 1 TABLET(20 MG) BY MOUTH TWICE DAILY 12/13/22   Cora Collum, DO  ferrous sulfate 325 (65 FE) MG tablet Take 325 mg by mouth daily with breakfast.    [provider]  fluticasone (FLONASE) 50 MCG/ACT nasal spray Place 2 sprays into both nostrils daily. SHAKE LIQUID AND USE  2 SPRAYS IN EACH NOSTRIL DAILY Strength: 50 MCG/ACT 05/05/23   Baloch, Mahnoor, MD  ibuprofen (ADVIL) 800 MG tablet Take 1 tablet (800 mg total) by mouth every 8 (eight) hours. 10/25/22   Hermina Staggers, MD  metroNIDAZOLE (FLAGYL) 500 MG tablet Take 1 tablet (500 mg total) by mouth 2 (two) times daily. 05/09/23   Constant, Peggy, MD  Multiple Vitamins-Minerals (ZINC PO) Take 1 tablet by mouth in the morning.    [provider]  ondansetron (ZOFRAN-ODT) 4 MG disintegrating tablet Take 1 tablet (4 mg total) by mouth every 8 (eight) hours as needed for nausea. 12/13/22   Sabino Dick, DO  pantoprazole (PROTONIX) 40 MG tablet TAKE 1 TABLET(40 MG) BY MOUTH DAILY Strength: 40 mg 05/05/23   Baloch, Mahnoor, MD  montelukast (SINGULAIR) 10 MG tablet Take 1 tablet (10 mg total) by mouth at bedtime. 05/12/18 08/02/19  Doreene Eland, MD      Allergies    Penicillins and Shrimp [shellfish allergy]    Review of Systems   Review of Systems  Cardiovascular:  Positive for palpitations.  All other systems reviewed and are negative.   Physical Exam Updated Vital Signs BP 125/75 (BP Location: Left Arm)   Pulse 77   Temp 97.8 F (36.6 C) (Oral)   Resp 16   Ht 5\' 4"  (1.626 m)   Wt 93.9 kg   LMP 06/26/2022   SpO2 99%  BMI 35.53 kg/m  Physical Exam Vitals and nursing note reviewed.  Constitutional:      Appearance: She is well-developed.  HENT:     Head: Normocephalic and atraumatic.  Cardiovascular:     Rate and Rhythm: Normal rate and regular rhythm.     Heart sounds: No murmur heard. Pulmonary:     Effort: Pulmonary effort is normal. No respiratory distress.     Breath sounds: Normal breath sounds.  Abdominal:     Palpations: Abdomen is soft.     Tenderness: There is no abdominal tenderness. There is no guarding or rebound.  Musculoskeletal:        General: No swelling or tenderness.  Skin:    General: Skin is warm and dry.  Neurological:     Mental Status: She is  alert and oriented to person, place, and time.  Psychiatric:        Behavior: Behavior normal.     ED Results / Procedures / Treatments   Labs (all labs ordered are listed, but only abnormal results are displayed) Labs Reviewed  BASIC METABOLIC PANEL - Abnormal; Notable for the following components:      Result Value   Sodium 134 (*)    Potassium 3.0 (*)    Glucose, Bld 124 (*)    Calcium 8.8 (*)    All other components within normal limits  CBC - Abnormal; Notable for the following components:   Hemoglobin 11.6 (*)    MCV 78.3 (*)    MCH 24.7 (*)    All other components within normal limits  TROPONIN I (HIGH SENSITIVITY)  TROPONIN I (HIGH SENSITIVITY)    EKG EKG Interpretation Date/Time:  Thursday June 19 2023 19:22:51 EST Ventricular Rate:  89 PR Interval:  181 QRS Duration:  82 QT Interval:  366 QTC Calculation: 446 R Axis:   47  Text Interpretation: Sinus rhythm Probable left atrial enlargement Borderline T wave abnormalities Confirmed by Tilden Fossa [phone]) on 06/19/2023 11:02:59 PM  Radiology DG Chest 2 View Result Date: 06/19/2023 CLINICAL DATA:  Shortness of breath and heart palpitations worsening x2 weeks. EXAM: CHEST - 2 VIEW COMPARISON:  Portable chest 01/25/2022 FINDINGS: The heart size and mediastinal contours are within normal limits. Both lungs are clear. The visualized skeletal structures are unremarkable apart from slight thoracic levoscoliosis. Cholecystectomy clips right upper abdomen. IMPRESSION: No evidence of acute chest disease. Electronically Signed   By: Almira Bar M.D.   On: 06/19/2023 20:01    Procedures Procedures    Medications Ordered in ED Medications  potassium chloride SA (KLOR-CON M) CR tablet 40 mEq (40 mEq Oral Given 06/20/23 0022)    ED Course/ Medical Decision Making/ A&P                                 Medical Decision Making Amount and/or Complexity of Data Reviewed Labs: ordered. Radiology:  ordered.  Risk Prescription drug management.   Patient here for evaluation of intermittent palpitations.  EKG was sinus rhythm.  She did have occasional sinus arrhythmia on the monitor.  Doubt PE she is PERC negative.  Labs with mild hypokalemia, mild anemia.  She was treated with oral potassium.  Discussed with patient unclear source of palpitations.  Feel she is stable for outpatient cardiology/PCP follow-up with return precautions.  Presentation is not consistent with acute CHF, life-threatening arrhythmia.        Final Clinical Impression(s) / ED  Diagnoses Final diagnoses:  Palpitations  Hypokalemia    Rx / DC Orders ED Discharge Orders     None         Tilden Fossa, MD 06/20/23 [phone]

## 2023-06-19 NOTE — Telephone Encounter (Signed)
See note

## 2023-06-19 NOTE — Telephone Encounter (Signed)
Patient c/o Palpitations:  STAT if patient reporting lightheadedness, shortness of breath, or chest pain  How long have you had palpitations/irregular HR/ Afib? Are you having the symptoms now?   Yes  Are you currently experiencing lightheadedness, SOB or CP?   Dizzinerss  Do you have a history of afib (atrial fibrillation) or irregular heart rhythm?   Yes  Have you checked your BP or HR? (document readings if available):   No  Are you experiencing any other symptoms? Fatigue  Patient is concerned she has been having "racing heart".

## 2023-06-19 NOTE — ED Triage Notes (Signed)
Pt reports intermittent palpitation and SHOB on exertion for the last two weeks. Pt reports hx of PACs and anemia. Pt reports symptoms have gotten worse over the last tow weeks.

## 2023-06-19 NOTE — Telephone Encounter (Signed)
Called and spoke to patient. Verified name and DOB.Patient report she is having palpitation/flutters, fatigue and SOB. She stated she has had them for years that would come and go. She said she's been having these symptoms for the last two weeks everyday and all day. She deny chest pain, dizziness, lightheadedness and blurred vision at this time. Her current heart rate is 102-114 while resting and her BP 122/85. Advised her with her current symptoms it was recommended that she go to the ED for evaluation. Patient verbalized understanding and agree.

## 2023-06-22 ENCOUNTER — Ambulatory Visit (INDEPENDENT_AMBULATORY_CARE_PROVIDER_SITE_OTHER): Payer: Medicaid Other

## 2023-06-22 ENCOUNTER — Ambulatory Visit
Admission: EM | Admit: 2023-06-22 | Discharge: 2023-06-22 | Disposition: A | Discharge status: Home / Self Care | Payer: Medicaid Other | Attending: Internal Medicine | Admitting: Internal Medicine

## 2023-06-22 DIAGNOSIS — R051 Acute cough: Secondary | ICD-10-CM | POA: Diagnosis not present

## 2023-06-22 DIAGNOSIS — U071 COVID-19: Secondary | ICD-10-CM

## 2023-06-22 DIAGNOSIS — R059 Cough, unspecified: Secondary | ICD-10-CM | POA: Diagnosis not present

## 2023-06-22 DIAGNOSIS — Z2089 Contact with and (suspected) exposure to other communicable diseases: Secondary | ICD-10-CM

## 2023-06-22 LAB — POC COVID19/FLU A&B COMBO
Covid Antigen, POC: POSITIVE — AB
Influenza A Antigen, POC: NEGATIVE
Influenza B Antigen, POC: NEGATIVE

## 2023-06-22 MED ORDER — BENZONATATE 200 MG PO CAPS: 200.0000 mg | ORAL_CAPSULE | Freq: Three times a day (TID) | ORAL | 0 refills | Status: DC | PRN

## 2023-06-22 MED ORDER — PAXLOVID (300/100) 20 X 150 MG & 10 X 100MG PO TBPK: 3.0000 | ORAL_TABLET | Freq: Two times a day (BID) | ORAL | 0 refills | Status: DC

## 2023-06-22 MED ORDER — PAXLOVID (300/100) 20 X 150 MG & 10 X 100MG PO TBPK: 3.0000 | ORAL_TABLET | Freq: Two times a day (BID) | ORAL | 0 refills | Status: AC

## 2023-06-22 NOTE — Discharge Instructions (Signed)
You have tested positive for COVID-19.  Please start Paxlovid twice daily for 10 days.  You may take Tessalon as needed for cough.  Lots of rest and fluids.  Over-the-counter Tylenol or ibuprofen as needed for fever management.  Please follow-up with your PCP if your symptoms do not improve.  Please go to the ER for any worsening symptoms.  I hope you feel better soon!

## 2023-06-22 NOTE — ED Provider Notes (Signed)
[facility]    CSN: [id number] Arrival date & time: 06/22/23  0900      History   Chief Complaint No chief complaint on file.   HPI Breanna Miranda is a 37 y.o. female  presents for evaluation of URI symptoms for 4 days. Patient reports associated symptoms of cough, congestion, sore throat, ear pain, tactile fevers, fatigue, shortness of breath. Denies N/V/D, documented fevers, body aches. Patient does not have a hx of asthma. Patient not not an active smoker.   Reports daughter had pneumonia.  Patient states she had a positive home COVID test yesterday.  States she has had COVID in the past without complication or hospitalization.  Pt has taken DayQuil, NyQuil, Mucinex OTC for symptoms.  Patient was seen in the ER on 12/26 for palpitations and was advised to follow-up outpatient.  Pt has no other concerns at this time.   HPI  Past Medical History:  Diagnosis Date   Anemia    Anxiety    Asthma    Blood transfusion without reported diagnosis 2016   Chiari I malformation (HCC)    Complication of anesthesia    Patient states he heart beat decreases, shallow breathing   COVID 07/14/2020   Depression    Dyspnea    Empty sella (HCC)    Family history of adverse reaction to anesthesia    mother heart beat decreases   GERD (gastroesophageal reflux disease)    Irregular heart rhythm    Low grade squamous intraepith lesion on cytologic smear cervix (lgsil) 01/15/2018   Menorrhagia     Patient Active Problem List   Diagnosis Date Noted   Urticaria 11/07/2022   It band syndrome, left 10/25/2022   Breast pain, left 10/25/2022   History of bilateral salpingectomy 10/17/2022   History of vaginal hysterectomy 10/17/2022   Frequent headaches 08/27/2022   Chronic throat clearing 03/23/2022   Palpitations 01/24/2022   1st degree AV block 01/24/2022   Thoracic back pain 10/09/2020   Breast pain 08/04/2020   External hemorrhoid 06/02/2020   Peripheral neuropathy  06/02/2020   Ganglion cyst 06/02/2020   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/25/2020   Gastroesophageal reflux disease without esophagitis 04/25/2020   Insomnia 10/15/2017   Pica in adults    Anxiety 08/10/2014   Anemia 05/06/2012    Past Surgical History:  Procedure Laterality Date   BREAST BIOPSY Left 12/2020   CHOLECYSTECTOMY     TUBAL LIGATION     VAGINAL HYSTERECTOMY Bilateral 09/17/2022   Procedure: VAGINAL HYSTERECTOMY WITH BILATERAL SALPINGECTOMY;  Surgeon: Hermina Staggers, MD;  Location: [facility] OR;  Service: Gynecology;  Laterality: Bilateral;    OB History     Gravida  5   Para  4   Term  2   Preterm  2   AB  1   Living  4      SAB  1   IAB  0   Ectopic  0   Multiple  0   Live Births  4            Home Medications    Prior to Admission medications   Medication Sig Start Date End Date Taking? Authorizing Provider  benzonatate (TESSALON) 200 MG capsule Take 1 capsule (200 mg total) by mouth 3 (three) times daily as needed. 06/22/23  Yes Radford Pax, NP  acetaminophen (TYLENOL) 500 MG tablet Take 1,000 mg by mouth every 6 (six) hours as  needed for mild pain.    [provider]  cetirizine (ZYRTEC) 10 MG tablet Take 1 tablet (10 mg total) by mouth daily. 11/07/22   [name], MD  Cyanocobalamin (VITAMIN B-12 PO) Take by mouth.    [provider]  ibuprofen (ADVIL) 800 MG tablet Take 1 tablet (800 mg total) by mouth every 8 (eight) hours. 10/25/22   Hermina Staggers, MD  Multiple Vitamins-Minerals (ZINC PO) Take 1 tablet by mouth in the morning.    [provider]  nirmatrelvir/ritonavir (PAXLOVID, 300/100,) 20 x 150 MG & 10 x 100MG  TBPK Take 3 tablets by mouth 2 (two) times daily for 5 days. Patient GFR is >60. Take nirmatrelvir (150 mg) two tablets twice daily for 5 days and ritonavir (100 mg) one tablet twice daily for 5 days. 06/22/23 06/27/23  Radford Pax, NP   montelukast (SINGULAIR) 10 MG tablet Take 1 tablet (10 mg total) by mouth at bedtime. 05/12/18 08/02/19  Doreene Eland, MD    Family History Family History  Problem Relation Age of Onset   Diabetes Father    Kidney disease Father        failure due to diabetes   Hypertension Mother    Heart attack Maternal Grandmother 43    Social History Social History   Tobacco Use   Smoking status: Former    Current packs/day: 0.00    Average packs/day: 0.1 packs/day for 2.0 years (0.2 ttl pk-yrs)    Types: Cigarettes    Start date: 05/24/2005    Quit date: 05/25/2007    Years since quitting: 16.0   Smokeless tobacco: Never  Vaping Use   Vaping status: Never Used  Substance Use Topics   Alcohol use: Yes    Alcohol/week: 1.0 standard drink of alcohol    Types: 1 Glasses of wine per week    Comment: occaional   Drug use: No     Allergies   Penicillins and Shrimp [shellfish allergy]   Review of Systems Review of Systems  Constitutional:  Positive for fatigue and fever.  HENT:  Positive for congestion and sore throat.   Respiratory:  Positive for cough and shortness of breath.      Physical Exam Triage Vital Signs ED Triage Vitals  Encounter Vitals Group     BP 06/22/23 0926 115/76     Systolic BP Percentile --      Diastolic BP Percentile --      Pulse Rate 06/22/23 0926 83     Resp 06/22/23 0926 18     Temp 06/22/23 0926 98.4 F (36.9 C)     Temp Source 06/22/23 0926 Oral     SpO2 06/22/23 0926 97 %     Weight --      Height --      Head Circumference --      Peak Flow --      Pain Score 06/22/23 0923 0     Pain Loc --      Pain Education --      Exclude from Growth Chart --    No data found.  Updated Vital Signs BP 115/76 (BP Location: Right Arm)   Pulse 83   Temp 98.4 F (36.9 C) (Oral)   Resp 18   LMP 06/26/2022   SpO2 97%   Visual Acuity Right Eye Distance:   Left Eye Distance:   Bilateral Distance:    Right Eye Near:   Left Eye Near:  Bilateral Near:     Physical Exam Vitals and nursing note reviewed.  Constitutional:      General: She is not in acute distress.    Appearance: She is well-developed. She is not ill-appearing.  HENT:     Head: Normocephalic and atraumatic.     Right Ear: Tympanic membrane and ear canal normal.     Left Ear: Tympanic membrane and ear canal normal.     Nose: Congestion present.     Mouth/Throat:     Mouth: Mucous membranes are moist.     Pharynx: Oropharynx is clear. Uvula midline. Posterior oropharyngeal erythema present.     Tonsils: No tonsillar exudate or tonsillar abscesses.  Eyes:     Conjunctiva/sclera: Conjunctivae normal.     Pupils: Pupils are equal, round, and reactive to light.  Cardiovascular:     Rate and Rhythm: Normal rate and regular rhythm.     Heart sounds: Normal heart sounds.  Pulmonary:     Effort: Pulmonary effort is normal. No respiratory distress.     Breath sounds: Normal breath sounds. No stridor. No wheezing or rhonchi.  Musculoskeletal:     Cervical back: Normal range of motion and neck supple.  Lymphadenopathy:     Cervical: No cervical adenopathy.  Skin:    General: Skin is warm and dry.  Neurological:     General: No focal deficit present.     Mental Status: She is alert and oriented to person, place, and time.  Psychiatric:        Mood and Affect: Mood normal.        Behavior: Behavior normal.      UC Treatments / Results  Labs (all labs ordered are listed, but only abnormal results are displayed) Labs Reviewed  POC COVID19/FLU A&B COMBO - Abnormal; Notable for the following components:      Result Value   Covid Antigen, POC Positive (*)    All other components within normal limits   Basic metabolic panel Order: [id number]  Status: Final result     Next appt: 07/15/2023 at 03:10 PM in Cardiology Ronney Asters, NP)   Test Result Released: Yes (not seen)   0 Result Notes          Component Ref Range & Units (hover) 3 d  ago (06/19/23) 2 mo ago (04/18/23) 4 mo ago (02/13/23) 6 mo ago (12/13/22) 9 mo ago (09/18/22) 9 mo ago (09/12/22) 10 mo ago (08/02/22)  Sodium 134 Low  139 R 137 138 R 137 138 139 R  Potassium 3.0 Low  4.2 R 3.9 CM 4.1 R 4.0 3.7 4.2 R  Chloride 104 104 R 102 102 R 104 109 105 R  CO2 22 24 R 25 23 R 25 22 20  R  Glucose, Bld 124 High  89 116 High  CM 90 105 High  CM 101 High  CM 103 High   Comment: Glucose reference range applies only to samples taken after fasting for at least 8 hours.  BUN 9 13 9 16 11 5  Low  10  Creatinine, Ser 0.63 0.77 R 0.83 0.88 R 0.86 0.79 0.84 R  Calcium 8.8 Low  9.2 R 8.7 Low  9.6 R 8.7 Low  9.0 8.8 R  GFR, Estimated >60  >60 CM  >60 CM >60 CM   Comment: (NOTE) Calculated using the CKD-EPI Creatinine Equation (2021)  Anion gap 8  10 CM  8 CM 7 CM   Comment: Performed at [facility], [address], [place], [place] [address]  Resulting Agency [facility]        Specimen Collected: 06/19/23 19:36 Last Resulted: 06/19/23 20:30    EKG   Radiology No results found.  Procedures Procedures (including critical care time)  Medications Ordered in UC Medications - No data to display  Initial Impression / Assessment and Plan / UC Course  I have reviewed the triage vital signs and the nursing notes.  Pertinent labs & imaging results that were available during my care of the patient were reviewed by me and considered in my medical decision making (see chart for details).     Reviewed exam and symptoms with patient.  No red flags.  Positive rapid COVID test.  Wet read of chest x-ray without consolidation, will contact for any positive results based on radiology overread once available.  Patient denies any kidney or liver function issues and labs from 12/26 reviewed.  She states she takes no medications prescribed or over-the-counter.  Patient has history of hysterectomy.  Patient is  followed by for Paxlovid, Rx sent to pharmacy.  Tessalon as needed for cough.  Discussed CDC guidelines for quarantine.  PCP follow-up if symptoms do not improve.  ER precautions reviewed and patient verbalized understanding. Final Clinical Impressions(s) / UC Diagnoses   Final diagnoses:  Acute cough  Exposure to pneumonia  COVID-19     Discharge Instructions      You have tested positive for COVID-19.  Please start Paxlovid twice daily for 10 days.  You may take Tessalon as needed for cough.  Lots of rest and fluids.  Over-the-counter Tylenol or ibuprofen as needed for fever management.  Please follow-up with your PCP if your symptoms do not improve.  Please go to the ER for any worsening symptoms.  I hope you feel better soon!     ED Prescriptions     Medication Sig Dispense Auth. Provider   nirmatrelvir/ritonavir (PAXLOVID, 300/100,) 20 x 150 MG & 10 x 100MG  TBPK  (Status: Discontinued) Take 3 tablets by mouth 2 (two) times daily for 5 days. Patient GFR is >60. Take nirmatrelvir (150 mg) two tablets twice daily for 5 days and ritonavir (100 mg) one tablet twice daily for 5 days. 30 tablet Radford Pax, NP   benzonatate (TESSALON) 200 MG capsule Take 1 capsule (200 mg total) by mouth 3 (three) times daily as needed. 20 capsule Radford Pax, NP   nirmatrelvir/ritonavir (PAXLOVID, 300/100,) 20 x 150 MG & 10 x 100MG  TBPK Take 3 tablets by mouth 2 (two) times daily for 5 days. Patient GFR is >60. Take nirmatrelvir (150 mg) two tablets twice daily for 5 days and ritonavir (100 mg) one tablet twice daily for 5 days. 30 tablet Radford Pax, NP      PDMP not reviewed this encounter.   Radford Pax, NP 06/22/23 1009

## 2023-06-22 NOTE — ED Triage Notes (Signed)
Pt presents to UC for c/o otalgia, productive coughing, night sweats, weakness x4 days. Home covid positive test Daughter had pneumonia recently. Taking dayquil, nyquil, mucinex give no relief.

## 2023-06-23 ENCOUNTER — Encounter: Payer: Self-pay | Admitting: Student

## 2023-06-26 ENCOUNTER — Ambulatory Visit
Admission: EM | Admit: 2023-06-26 | Discharge: 2023-06-26 | Disposition: A | Discharge status: Home / Self Care | Payer: Medicaid Other | Attending: Family Medicine | Admitting: Family Medicine

## 2023-06-26 ENCOUNTER — Ambulatory Visit (INDEPENDENT_AMBULATORY_CARE_PROVIDER_SITE_OTHER): Payer: Medicaid Other

## 2023-06-26 DIAGNOSIS — R051 Acute cough: Secondary | ICD-10-CM

## 2023-06-26 DIAGNOSIS — J209 Acute bronchitis, unspecified: Secondary | ICD-10-CM | POA: Diagnosis not present

## 2023-06-26 DIAGNOSIS — U071 COVID-19: Secondary | ICD-10-CM | POA: Diagnosis not present

## 2023-06-26 DIAGNOSIS — R059 Cough, unspecified: Secondary | ICD-10-CM | POA: Diagnosis not present

## 2023-06-26 DIAGNOSIS — R509 Fever, unspecified: Secondary | ICD-10-CM | POA: Diagnosis not present

## 2023-06-26 MED ORDER — PREDNISONE 20 MG PO TABS: 40.0000 mg | ORAL_TABLET | Freq: Every day | ORAL | 0 refills | Status: AC

## 2023-06-26 MED ORDER — GUAIFENESIN-CODEINE 100-10 MG/5ML PO SOLN: 5.0000 mL | Freq: Four times a day (QID) | ORAL | 0 refills | Status: DC | PRN

## 2023-06-26 MED ORDER — ALBUTEROL SULFATE HFA 108 (90 BASE) MCG/ACT IN AERS: 1.0000 | INHALATION_SPRAY | Freq: Four times a day (QID) | RESPIRATORY_TRACT | 0 refills | Status: AC | PRN

## 2023-06-26 NOTE — ED Provider Notes (Signed)
 [facility]    CSN: [id number] Arrival date & time: 06/26/23  1128      History   Chief Complaint Chief Complaint  Patient presents with   Cough   Wheezing   Shortness of Breath    HPI Breanna Miranda is a 38 y.o. female presents for evaluation of cough.  Patient was seen in urgent care on 12/29 for 4 days of upper respiratory symptoms.  She had a positive rapid COVID test in clinic.  She also had a negative chest x-ray at that time.  She was started on Paxlovid , benzonatate .  She states she is taking these as prescribed and feels like her cough is worsening.  Reports posttussive vomiting with continued low-grade fevers of 100 degrees.  Endorses shortness of breath and wheezing as well.  Reports history of asthma as a child but outgrew it.  Denies smoking history.  Reports headache secondary to coughing.  She has tried over-the-counter cough medicine as well without improvement.  States she also tried a previous prescription of Promethazine  DM with no improvement. no other concerns at this time.   Cough Associated symptoms: fever, headaches, shortness of breath and wheezing   Wheezing Associated symptoms: cough, fever, headaches and shortness of breath   Shortness of Breath Associated symptoms: cough, fever, headaches and wheezing     Past Medical History:  Diagnosis Date   Anemia    Anxiety    Asthma    Blood transfusion without reported diagnosis 2016   Chiari I malformation (HCC)    Complication of anesthesia    Patient states he heart beat decreases, shallow breathing   COVID 07/14/2020   Depression    Dyspnea    Empty sella (HCC)    Family history of adverse reaction to anesthesia    mother heart beat decreases   GERD (gastroesophageal reflux disease)    Irregular heart rhythm    Low grade squamous intraepith lesion on cytologic smear cervix (lgsil) 01/15/2018   Menorrhagia     Patient Active Problem List   Diagnosis Date Noted   Urticaria  11/07/2022   It band syndrome, left 10/25/2022   Breast pain, left 10/25/2022   History of bilateral salpingectomy 10/17/2022   History of vaginal hysterectomy 10/17/2022   Frequent headaches 08/27/2022   Chronic throat clearing 03/23/2022   Palpitations 01/24/2022   1st degree AV block 01/24/2022   Thoracic back pain 10/09/2020   Breast pain 08/04/2020   External hemorrhoid 06/02/2020   Peripheral neuropathy 06/02/2020   Ganglion cyst 06/02/2020   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/25/2020   Gastroesophageal reflux disease without esophagitis 04/25/2020   Insomnia 10/15/2017   Pica in adults    Anxiety 08/10/2014   Anemia 05/06/2012    Past Surgical History:  Procedure Laterality Date   BREAST BIOPSY Left 12/2020   CHOLECYSTECTOMY     TUBAL LIGATION     VAGINAL HYSTERECTOMY Bilateral 09/17/2022   Procedure: VAGINAL HYSTERECTOMY WITH BILATERAL SALPINGECTOMY;  Surgeon: Lorence Ozell CROME, MD;  Location: [facility] OR;  Service: Gynecology;  Laterality: Bilateral;    OB History     Gravida  5   Para  4   Term  2   Preterm  2   AB  1   Living  4      SAB  1   IAB  0   Ectopic  0   Multiple  0   Live Births  4  Home Medications    Prior to Admission medications   Medication Sig Start Date End Date Taking? Authorizing Provider  albuterol  (VENTOLIN  HFA) 108 (90 Base) MCG/ACT inhaler Inhale 1-2 puffs into the lungs every 6 (six) hours as needed. 06/26/23  Yes Kairo Laubacher, Jodi R, NP  guaiFENesin -codeine  100-10 MG/5ML syrup Take 5 mLs by mouth every 6 (six) hours as needed for cough. 06/26/23  Yes Chozen Latulippe, Jodi R, NP  predniSONE  (DELTASONE ) 20 MG tablet Take 2 tablets (40 mg total) by mouth daily with breakfast for 5 days. 06/26/23 07/01/23 Yes Khanh Tanori, Jodi R, NP  acetaminophen  (TYLENOL ) 500 MG tablet Take 1,000 mg by mouth every 6 (six) hours as needed for mild pain.    [provider]  benzonatate  (TESSALON )  200 MG capsule Take 1 capsule (200 mg total) by mouth 3 (three) times daily as needed. 06/22/23   Arrion Burruel, Jodi R, NP  cetirizine  (ZYRTEC ) 10 MG tablet Take 1 tablet (10 mg total) by mouth daily. 11/07/22   Jennelle Riis, MD  Cyanocobalamin  (VITAMIN B-12 PO) Take by mouth.    [provider]  ibuprofen  (ADVIL ) 800 MG tablet Take 1 tablet (800 mg total) by mouth every 8 (eight) hours. 10/25/22   Ervin, Michael L, MD  Multiple Vitamins-Minerals (ZINC PO) Take 1 tablet by mouth in the morning.    [provider]  nirmatrelvir/ritonavir (PAXLOVID , 300/100,) 20 x 150 MG & 10 x 100MG  TBPK Take 3 tablets by mouth 2 (two) times daily for 5 days. Patient GFR is >60. Take nirmatrelvir (150 mg) two tablets twice daily for 5 days and ritonavir (100 mg) one tablet twice daily for 5 days. 06/22/23 06/27/23  Elner Seifert, Jodi R, NP  montelukast  (SINGULAIR ) 10 MG tablet Take 1 tablet (10 mg total) by mouth at bedtime. 05/12/18 08/02/19  Anders Otto DASEN, MD    Family History Family History  Problem Relation Age of Onset   Diabetes Father    Kidney disease Father        failure due to diabetes   Hypertension Mother    Heart attack Maternal Grandmother 77    Social History Social History   Tobacco Use   Smoking status: Former    Current packs/day: 0.00    Average packs/day: 0.1 packs/day for 2.0 years (0.2 ttl pk-yrs)    Types: Cigarettes    Start date: 05/24/2005    Quit date: 05/25/2007    Years since quitting: 16.0   Smokeless tobacco: Never  Vaping Use   Vaping status: Never Used  Substance Use Topics   Alcohol use: Yes    Alcohol/week: 1.0 standard drink of alcohol    Types: 1 Glasses of wine per week    Comment: occaional   Drug use: No     Allergies   Penicillins and Shrimp [shellfish allergy]   Review of Systems Review of Systems  Constitutional:  Positive for fever.  HENT:  Positive for congestion.   Respiratory:  Positive for cough, shortness of breath and wheezing.    Neurological:  Positive for headaches.     Physical Exam Triage Vital Signs ED Triage Vitals  Encounter Vitals Group     BP 06/26/23 1346 (!) 231/190     Systolic BP Percentile --      Diastolic BP Percentile --      Pulse Rate 06/26/23 1345 (!) 107     Resp 06/26/23 1345 17     Temp 06/26/23 1346 98.5 F (36.9 C)     Temp  Source 06/26/23 1346 Oral     SpO2 06/26/23 1345 96 %     Weight --      Height --      Head Circumference --      Peak Flow --      Pain Score 06/26/23 1345 4     Pain Loc --      Pain Education --      Exclude from Growth Chart --    No data found.  Updated Vital Signs BP 129/88   Pulse (!) 107   Temp 98.5 F (36.9 C) (Oral)   Resp 17   LMP 06/26/2022   SpO2 96%   Visual Acuity Right Eye Distance:   Left Eye Distance:   Bilateral Distance:    Right Eye Near:   Left Eye Near:    Bilateral Near:     Physical Exam Vitals and nursing note reviewed.  Constitutional:      General: She is not in acute distress.    Appearance: She is well-developed. She is not ill-appearing.  HENT:     Head: Normocephalic and atraumatic.     Right Ear: Tympanic membrane and ear canal normal.     Left Ear: Tympanic membrane and ear canal normal.     Nose: Congestion present.     Mouth/Throat:     Mouth: Mucous membranes are moist.     Pharynx: Oropharynx is clear. Uvula midline. No oropharyngeal exudate or posterior oropharyngeal erythema.     Tonsils: No tonsillar exudate or tonsillar abscesses.  Eyes:     Conjunctiva/sclera: Conjunctivae normal.     Pupils: Pupils are equal, round, and reactive to light.  Cardiovascular:     Rate and Rhythm: Regular rhythm. Tachycardia present.     Heart sounds: Normal heart sounds.     Comments: Mildly tacky at 107 Pulmonary:     Effort: Pulmonary effort is normal.     Breath sounds: Normal breath sounds.  Musculoskeletal:     Cervical back: Normal range of motion and neck supple.  Lymphadenopathy:      Cervical: No cervical adenopathy.  Skin:    General: Skin is warm and dry.  Neurological:     General: No focal deficit present.     Mental Status: She is alert and oriented to person, place, and time.  Psychiatric:        Mood and Affect: Mood normal.        Behavior: Behavior normal.      UC Treatments / Results  Labs (all labs ordered are listed, but only abnormal results are displayed) Labs Reviewed - No data to display  EKG   Radiology No results found.  Procedures Procedures (including critical care time)  Medications Ordered in UC Medications - No data to display  Initial Impression / Assessment and Plan / UC Course  I have reviewed the triage vital signs and the nursing notes.  Pertinent labs & imaging results that were available during my care of the patient were reviewed by me and considered in my medical decision making (see chart for details).     Reviewed exam and symptoms with patient.  No red flags.  Wet read of x-ray without obvious consolidation, will contact for any positive results based on radiology overread.  Will start prednisone  daily for 5 days.  Albuterol  inhaler as needed.  Will do Cheratussin cough syrup as needed.  Advised PCP follow-up in 2 to 3 days for recheck.  Strict ER precautions  reviewed and patient verbalized understanding. Final Clinical Impressions(s) / UC Diagnoses   Final diagnoses:  Acute cough  COVID-19  Acute bronchitis, unspecified organism     Discharge Instructions      Start prednisone  daily for 5 days.  I sent in albuterol  inhaler to use as needed for shortness of breath.  You may take guaifenesin /codeine  cough syrup every 6 hours as needed for your cough.  Lots of rest and fluids.  Please follow-up with your PCP in 2 to 3 days for recheck.  Please go to the ER if you develop any worsening symptoms.  I hope you feel better soon!     ED Prescriptions     Medication Sig Dispense Auth. Provider   predniSONE   (DELTASONE ) 20 MG tablet Take 2 tablets (40 mg total) by mouth daily with breakfast for 5 days. 10 tablet Gaines Cartmell, Jodi R, NP   albuterol  (VENTOLIN  HFA) 108 (90 Base) MCG/ACT inhaler Inhale 1-2 puffs into the lungs every 6 (six) hours as needed. 1 each Breyonna Nault, Jodi R, NP   guaiFENesin -codeine  100-10 MG/5ML syrup Take 5 mLs by mouth every 6 (six) hours as needed for cough. 118 mL Dene Landsberg, Jodi R, NP      I have reviewed the PDMP during this encounter.   Loreda Myla SAUNDERS, NP 06/26/23 [phone]

## 2023-06-26 NOTE — Discharge Instructions (Signed)
 Start prednisone  daily for 5 days.  I sent in albuterol  inhaler to use as needed for shortness of breath.  You may take guaifenesin /codeine  cough syrup every 6 hours as needed for your cough.  Lots of rest and fluids.  Please follow-up with your PCP in 2 to 3 days for recheck.  Please go to the ER if you develop any worsening symptoms.  I hope you feel better soon!

## 2023-06-26 NOTE — ED Triage Notes (Signed)
 Pt presents with c/o cough that causes her to vomit and chest pain X 2 wks.  Pt states she is COVID + and has been feeling worse. Feels worse at night.

## 2023-07-02 ENCOUNTER — Ambulatory Visit: Payer: Medicaid Other | Admitting: Family Medicine

## 2023-07-07 ENCOUNTER — Encounter: Payer: Self-pay | Admitting: Student

## 2023-07-07 ENCOUNTER — Ambulatory Visit (INDEPENDENT_AMBULATORY_CARE_PROVIDER_SITE_OTHER): Payer: Medicaid Other | Admitting: Student

## 2023-07-07 VITALS — BP 107/61 | HR 80 | Temp 98.2°F | Ht 61.0 in | Wt 214.4 lb

## 2023-07-07 DIAGNOSIS — G9331 Postviral fatigue syndrome: Secondary | ICD-10-CM | POA: Insufficient documentation

## 2023-07-07 NOTE — Assessment & Plan Note (Addendum)
 Patient comes in for persistent cough that has been around since just before Christmas.  Patient was diagnosed with COVID around Christmas, received treatment.  Patient then went to urgent care for cough, was diagnosed with bronchitis and treated with prednisone , no improvement in cough/symptoms.  Patient denies any recent fevers or systemic symptoms, reports she feels well, just has persistent cough.  Cough most concerning for postviral cough syndrome, as this is patient's only complaint.  Patient has tried a number of prescribed/over-the-counter cough suppressants without any success.  Discussed with patient there is no good therapy for this issue, and cough should resolve on its own in a few weeks.  Recommend patient follow-up if symptoms worsen or she feels ill. - Follow-up as needed - Over-the-counter cough suppressants

## 2023-07-07 NOTE — Progress Notes (Signed)
  SUBJECTIVE:   CHIEF COMPLAINT / HPI:   Cough Started before christmas, then caught covid and was treated and then continued to have cough and went back to UC and was dx w/ bronchitis. Was treated w/ prednisone , no relief in cough. Has tried robitussin, mucinex , delsium and honey/tea, and no relief. Otherwise feels great, just can't shake the cough. Took something with codiene and it was too strong, had her feeling funny.   No fevers, or systemic symptoms.    PERTINENT  PMH / PSH:    OBJECTIVE:  BP 107/61   Pulse 80   Temp 98.2 F (36.8 C) (Oral)   Ht 5' 1 (1.549 m)   Wt 214 lb 6.4 oz (97.3 kg)   LMP 06/26/2022   SpO2 100%   BMI 40.51 kg/m  Physical Exam Constitutional:      General: She is not in acute distress.    Appearance: Normal appearance. She is not ill-appearing.  HENT:     Mouth/Throat:     Mouth: Mucous membranes are moist.     Pharynx: Oropharynx is clear. No oropharyngeal exudate or posterior oropharyngeal erythema.  Cardiovascular:     Rate and Rhythm: Normal rate and regular rhythm.     Pulses: Normal pulses.     Heart sounds: Normal heart sounds. No murmur heard.    No friction rub. No gallop.  Pulmonary:     Effort: Pulmonary effort is normal. No respiratory distress.     Breath sounds: Normal breath sounds. No stridor. No wheezing, rhonchi or rales.  Neurological:     Mental Status: She is alert.      ASSESSMENT/PLAN:   Assessment & Plan Postviral syndrome Patient comes in for persistent cough that has been around since just before Christmas.  Patient was diagnosed with COVID around Christmas, received treatment.  Patient then went to urgent care for cough, was diagnosed with bronchitis and treated with prednisone , no improvement in cough/symptoms.  Patient denies any recent fevers or systemic symptoms, reports she feels well, just has persistent cough.  Cough most concerning for postviral cough syndrome, as this is patient's only complaint.   Patient has tried a number of prescribed/over-the-counter cough suppressants without any success.  Discussed with patient there is no good therapy for this issue, and cough should resolve on its own in a few weeks.  Recommend patient follow-up if symptoms worsen or she feels ill. - Follow-up as needed - Over-the-counter cough suppressants No follow-ups on file. Penne Rhein, MD 07/07/2023, 11:34 AM PGY-3, [facility]

## 2023-07-11 NOTE — Progress Notes (Deleted)
Cardiology Clinic Note   Patient Name: Breanna Miranda Date of Encounter: 07/11/2023  Primary Care Provider:  Latrelle Dodrill, MD Primary Cardiologist:  Lance Muss, MD  Patient Profile    Breanna Miranda 38 year old female presents the clinic today for follow-up evaluation of her palpitations.  Past Medical History    Past Medical History:  Diagnosis Date   Anemia    Anxiety    Asthma    Blood transfusion without reported diagnosis 2016   Chiari I malformation (HCC)    Complication of anesthesia    Patient states he heart beat decreases, shallow breathing   COVID 07/14/2020   Depression    Dyspnea    Empty sella (HCC)    Family history of adverse reaction to anesthesia    mother heart beat decreases   GERD (gastroesophageal reflux disease)    Irregular heart rhythm    Low grade squamous intraepith lesion on cytologic smear cervix (lgsil) 01/15/2018   Menorrhagia    Past Surgical History:  Procedure Laterality Date   BREAST BIOPSY Left 12/2020   CHOLECYSTECTOMY     TUBAL LIGATION     VAGINAL HYSTERECTOMY Bilateral 09/17/2022   Procedure: VAGINAL HYSTERECTOMY WITH BILATERAL SALPINGECTOMY;  Surgeon: Hermina Staggers, MD;  Location: [facility] OR;  Service: Gynecology;  Laterality: Bilateral;    Allergies  Allergies  Allergen Reactions   Penicillins Anaphylaxis, Hives and Itching    Has patient had a PCN reaction causing immediate rash, facial/tongue/throat swelling, SOB or lightheadedness with hypotension: Yes Has patient had a PCN reaction causing severe rash involving mucus membranes or skin necrosis: No Has patient had a PCN reaction that required hospitalization No Has patient had a PCN reaction occurring within the last 10 years: Yes If all of the above answers are "NO", then may proceed with Cephalosporin use.    Shrimp [Shellfish Allergy] Anaphylaxis, Hives and Itching    History of Present Illness    Breanna Miranda has a PMH of  asthma, depression, dyspnea, GERD, palpitations, anxiety, and anemia.  She wore a cardiac event monitor in 2016 which was reassuring.  She was seen by Dr.Varanasi 05/31/2022.  During that time she noted a several hour episode of fast heart rate and brief episodes of palpitations.  She previously had blood transfusions and IV iron due to heavy bleeding from menstruation.  She denied chest pain, dizziness, lower extremity swelling, orthopnea.  It was felt that her chest soreness was atypical for ischemia.  Echocardiogram was ordered and showed normal LVEF and no significant valvular abnormalities.  She wore a cardiac event monitor 07/09/2022 which showed normal sinus rhythm with rare PACs and rare PVCs no sinus arrhythmia no atrial fibrillation.  She presented to the emergency department on 02/13/2023 and was discharged on 02/14/2023.  She reported chest pain.  She also noted that she had been having increased palpitations over the past 1-2 days.  She described her episodes as a flutter type palpitations that was on and off.  At the time of exam she reported her palpitations were constant.  She also noted some chest pressure that had been brief.  She denied shortness of breath and lower extremity swelling.  Her EKG showed sinus rhythm with first-degree AV block.  She was noted to have occasional PACs.  Her high-sensitivity troponin was noted to be 3 and 3 and her magnesium was 1.9.  Her BMP was unremarkable.  Her CBC was stable.  She presented to the clinic 02/14/23  for follow-up evaluation and stated she presented to the emergency department because she had continued palpitations.  We reviewed her lab work and EKG.  We reviewed telemetry report.  We also reviewed her previous cardiac event monitor and echocardiogram.  She expressed understanding.  We reviewed triggers for palpitations.  She reported that she did not sleep well at night and was tired through the day.  She also reported snoring.  Her STOP-BANG score  was 4.  I will ordered a home sleep study, 14-day cardiac event monitor, and planned follow-up in 2 to 3 months.  Her cardiac event monitor showed minimum heart rate of 55, maximum heart rate of 148 and average heart rate of 82 bpm.  She was noted to have rare isolated PACs and PVCs with predominantly normal sinus rhythm.  No arrhythmias were noted.  Sleep study not yet resulted.  She presents to the clinic today for follow-up evaluation and states she has been exercising more and reduced her calories to around 1200/day.  She does note some periods of dizziness throughout the week.  She reports maintaining adequate p.o. hydration.  We reviewed her cardiac event monitor.  She reports that she was not able to keep the monitor on.  She did have PACs PVCs and SVT.  We reviewed vagal maneuvers.  She expressed understanding.  I will order a BMP, have her increase her calories in diet, continue her current medical therapy, and plan follow-up in 6 to 9 months.  Today she denies chest pain, shortness of breath, lower extremity edema, fatigue,  melena, hematuria, hemoptysis, diaphoresis, weakness, presyncope, syncope, orthopnea, and PND.     Home Medications    Prior to Admission medications   Medication Sig Start Date End Date Taking? Authorizing Provider  acetaminophen (TYLENOL) 500 MG tablet Take 1,000 mg by mouth every 6 (six) hours as needed for mild pain.    [provider]  cetirizine (ZYRTEC) 10 MG tablet Take 1 tablet (10 mg total) by mouth daily. 11/07/22   [name], MD  Cyanocobalamin (VITAMIN B-12 PO) Take by mouth.    [provider]  diclofenac Sodium (VOLTAREN) 1 % GEL Apply 1 Application topically 2 (two) times daily as needed (pain). 04/08/22   [provider]  famotidine (PEPCID) 20 MG tablet TAKE 1 TABLET(20 MG) BY MOUTH TWICE DAILY 12/13/22   Cora Collum, DO  ferrous sulfate 325 (65 FE) MG tablet Take 325 mg by mouth daily with breakfast.     [provider]  fluticasone (FLONASE) 50 MCG/ACT nasal spray SHAKE LIQUID AND USE 2 SPRAYS IN EACH NOSTRIL DAILY Patient taking differently: Place 2 sprays into both nostrils daily as needed for allergies. 03/08/22   Cora Collum, DO  ibuprofen (ADVIL) 800 MG tablet Take 1 tablet (800 mg total) by mouth every 8 (eight) hours. 10/25/22   Hermina Staggers, MD  Multiple Vitamins-Minerals (ZINC PO) Take 1 tablet by mouth in the morning.    [provider]  ondansetron (ZOFRAN-ODT) 4 MG disintegrating tablet Take 1 tablet (4 mg total) by mouth every 8 (eight) hours as needed for nausea. 12/13/22   Sabino Dick, DO  pantoprazole (PROTONIX) 40 MG tablet TAKE 1 TABLET(40 MG) BY MOUTH DAILY Strength: 40 mg 02/05/23   Baloch, Mahnoor, MD  montelukast (SINGULAIR) 10 MG tablet Take 1 tablet (10 mg total) by mouth at bedtime. 05/12/18 08/02/19  Doreene Eland, MD    Family History    Family History  Problem Relation  Age of Onset   Diabetes Father    Kidney disease Father        failure due to diabetes   Hypertension Mother    Heart attack Maternal Grandmother 64   She indicated that her mother is alive. She indicated that her father is deceased. She indicated that all of her four sisters are alive. She indicated that her brother is deceased. She indicated that her maternal grandmother is deceased. She indicated that both of her daughters are alive. She indicated that both of her sons are alive.  Social History    Social History   Socioeconomic History   Marital status: Married    Spouse name: Not on file   Number of children: Not on file   Years of education: Not on file   Highest education level: Not on file  Occupational History   Not on file  Tobacco Use   Smoking status: Former    Current packs/day: 0.00    Average packs/day: 0.1 packs/day for 2.0 years (0.2 ttl pk-yrs)    Types: Cigarettes    Start date: 05/24/2005    Quit date: 05/25/2007    Years since  quitting: 16.1   Smokeless tobacco: Never  Vaping Use   Vaping status: Never Used  Substance and Sexual Activity   Alcohol use: Yes    Alcohol/week: 1.0 standard drink of alcohol    Types: 1 Glasses of wine per week    Comment: occaional   Drug use: No   Sexual activity: Yes    Birth control/protection: Surgical  Other Topics Concern   Not on file  Social History Narrative   Are you right handed or left handed? left   Are you currently employed ?    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   Who lives with you? Husband and two children   What type of home do you live in: 1 story or 2 story? two    Caffeine none   Social Drivers of Corporate investment banker Strain: Not on file  Food Insecurity: Not on file  Transportation Needs: Not on file  Physical Activity: Not on file  Stress: Not on file  Social Connections: Not on file  Intimate Partner Violence: Not on file     Review of Systems    General:  No chills, fever, night sweats or weight changes.  Cardiovascular:  No chest pain, dyspnea on exertion, edema, orthopnea, palpitations, paroxysmal nocturnal dyspnea. Dermatological: No rash, lesions/masses Respiratory: No cough, dyspnea Urologic: No hematuria, dysuria Abdominal:   No nausea, vomiting, diarrhea, bright red blood per rectum, melena, or hematemesis Neurologic:  No visual changes, wkns, changes in mental status.  Dizziness All other systems reviewed and are otherwise negative except as noted above.  Physical Exam    VS:  LMP 06/26/2022  , BMI There is no height or weight on file to calculate BMI. GEN: Well nourished, well developed, in no acute distress. HEENT: normal. Neck: Supple, no JVD, carotid bruits, or masses. Cardiac: RRR, no murmurs, rubs, or gallops. No clubbing, cyanosis, edema.  Radials/DP/PT 2+ and equal bilaterally.  Respiratory:  Respirations regular and unlabored, clear to auscultation bilaterally. GI:  Soft, nontender, nondistended, BS + x 4. MS: no deformity or atrophy. Skin: warm and dry, no rash. Neuro:  Strength and sensation are intact. Psych: Normal affect.  Accessory Clinical Findings    Recent Labs: 12/13/2022: ALT 13; TSH 1.470 02/14/2023: Magnesium  1.9 06/19/2023: BUN 9; Creatinine, Ser 0.63; Hemoglobin 11.6; Platelets 217; Potassium 3.0; Sodium 134   Recent Lipid Panel    Component Value Date/Time   CHOL 185 05/21/2021 1655   TRIG 340 (H) 05/21/2021 1655   HDL 61 05/21/2021 1655   CHOLHDL 3.0 05/21/2021 1655   CHOLHDL 2.5 08/16/2016 0452   VLDL 16 08/16/2016 0452   LDLCALC 71 05/21/2021 1655    No BP recorded.  {Refresh Note OR Click here to enter BP  :1}***    ECG personally reviewed by me today-EKG Interpretation Date/Time:    Ventricular Rate:    PR Interval:    QRS Duration:    QT Interval:    QTC Calculation:   R Axis:      Text Interpretation:     Echocardiogram 06/28/2022  IMPRESSIONS     1. Left ventricular ejection fraction, by estimation, is 60 to 65%. The  left ventricle has normal function. The left ventricle has no regional  wall motion abnormalities. Left ventricular diastolic parameters were  normal.   2. Right ventricular systolic function is normal. The right ventricular  size is normal. Tricuspid regurgitation signal is inadequate for assessing  PA pressure.   3. The mitral valve is normal in structure. No evidence of mitral valve  regurgitation. No evidence of mitral stenosis.   4. The aortic valve is normal in structure. Aortic valve regurgitation is  not visualized. No aortic stenosis is present.   5. The inferior vena cava is normal in size with greater than 50%  respiratory variability, suggesting right atrial pressure of 3 mmHg.   FINDINGS   Left Ventricle: Left ventricular ejection fraction, by estimation, is 60  to 65%. The left ventricle has normal function. The left ventricle has no  regional wall motion abnormalities.  3D left ventricular ejection fraction  analysis performed but not  reported based on interpreter judgement due to suboptimal tracking. The  left ventricular internal cavity size was normal in size. There is no left  ventricular hypertrophy. Left ventricular diastolic parameters were  normal. Normal left ventricular filling   pressure.   Right Ventricle: The right ventricular size is normal. No increase in  right ventricular wall thickness. Right ventricular systolic function is  normal. Tricuspid regurgitation signal is inadequate for assessing PA  pressure.   Left Atrium: Left atrial size was normal in size.   Right Atrium: Right atrial size was normal in size.   Pericardium: There is no evidence of pericardial effusion.   Mitral Valve: The mitral valve is normal in structure. No evidence of  mitral valve regurgitation. No evidence of mitral valve stenosis.   Tricuspid Valve: The tricuspid valve is normal in structure. Tricuspid  valve regurgitation is trivial. No evidence of tricuspid stenosis.   Aortic Valve: The aortic valve is normal in structure. Aortic valve  regurgitation is not visualized. No aortic stenosis is present.   Pulmonic Valve: The pulmonic valve was normal in structure. Pulmonic valve  regurgitation is not visualized. No evidence of pulmonic stenosis.   Aorta: The aortic root is normal in size and structure.   Venous: The inferior vena cava is normal in size with greater than 50%  respiratory variability, suggesting right atrial pressure of 3 mmHg.   IAS/Shunts: No atrial level shunt detected by color flow Doppler.    Cardiac event monitor 07/09/2022    Normal sinus rhythm with rare PACs and rare PVCs.   Symptoms correlated to PACs.   No sustained arrhythmias.  No atrial fibrillation.     Patch Wear Time:  4 days and 10 hours (2023-12-17T10:07:40-0500 to 2023-12-21T20:47:22-0500)   Patient had a min HR of 59 bpm, max HR of 148 bpm, and avg HR of 87  bpm. Predominant underlying rhythm was Sinus Rhythm. Isolated SVEs were rare (<1.0%), SVE Triplets were rare (<1.0%), and no SVE Couplets were present. Isolated VEs were rare (<1.0%),  and no VE Couplets or VE Triplets were present.  Cardiac event monitor 04/08/2023     Patient had a minimum heart rate of 55 bpm, maximum heart rate of 148 bpm, and average heart rate of 82 bpm.   Predominant underlying rhythm was sinus rhythm.   One short run of SVT (5 beats).   Isolated PACs were rare (<1.0%).   Isolated PVCs were rare (<1.0%).   No symptoms.   No malignant arrhythmias.  Assessment & Plan   1.  Palpitations-heart rate today 76.  Cardiac event monitor showed rare PACs and PVCs with no significant arrhythmias.  Details above. Avoid triggers caffeine, chocolate, EtOH, dehydration etc.-reviewed Maintain physical activity Vagal maneuvers  Dizziness- EKG today shows normal sinus rhythm 76 bpm.  Last thyroid and magnesium levels within normal limits.  Appears to be related to calorie restricted diet. Increase diet calories Increase p.o. hydration Order  BMP,    Chest pain of uncertain etiology-denies further episodes. Maintain physical activity No plans for ischemic evaluation  Daytime somnolence-reports snoring.  STOP-BANG score 4 Home sleep study-reordered Sleep hygiene instructions given  Obesity-weight today 215. Heart healthy low-sodium diet-continue/reviewed Maintain physical activity Continue weight loss  Disposition: Follow-up with Dr.Varanasi or me in 6-9 months.   Breanna Miranda. Breanna Ramos NP-C     07/11/2023, 8:17 AM [facility] [address] Office [phone] Fax [phone]    I spent 14*** minutes examining this patient, reviewing medications, and using patient centered shared decision making involving her cardiac care.  Prior to her visit I spent greater than 20 minutes reviewing her past medical history,  medications, and  prior cardiac tests.

## 2023-07-15 ENCOUNTER — Ambulatory Visit: Payer: Medicaid Other | Attending: General Practice | Admitting: General Practice

## 2023-07-17 ENCOUNTER — Encounter: Payer: Self-pay | Admitting: General Practice

## 2023-07-25 ENCOUNTER — Other Ambulatory Visit: Payer: Self-pay

## 2023-07-25 ENCOUNTER — Encounter: Payer: Self-pay | Admitting: Student

## 2023-07-25 ENCOUNTER — Ambulatory Visit (INDEPENDENT_AMBULATORY_CARE_PROVIDER_SITE_OTHER): Payer: Medicaid Other | Admitting: Student

## 2023-07-25 VITALS — BP 98/66 | HR 78 | Wt 203.0 lb

## 2023-07-25 DIAGNOSIS — R55 Syncope and collapse: Secondary | ICD-10-CM | POA: Diagnosis not present

## 2023-07-25 MED ORDER — BLOOD GLUCOSE MONITOR KIT: PACK | 0 refills | Status: DC

## 2023-07-25 MED ORDER — MIDODRINE HCL 5 MG PO TABS: ORAL_TABLET | ORAL | 0 refills | Status: DC

## 2023-07-25 NOTE — Patient Instructions (Addendum)
Breanna Miranda to meet you! I'm sorry you're having these symptoms.  Symptoms I have a high suspicion that you are having vasovagal syncope, I am reassured by the workup that she had in the past.  I am going to recheck some labs today.  I would also like to start her on a medicine called midodrine.  This is a medicine to raise your blood pressure.  I would recommend that you take it around 4 or 5 PM to see if this helps prevent syncopal episodes in the evening. I am also sending you with a prescription for a glucometer. If you are able, please try to check a blood sugar immediately after your next episode.  Make an appointment to come back and see your PCP in a month or so to see how you are doing.  Eliezer Mccoy, MD

## 2023-07-26 LAB — BASIC METABOLIC PANEL
BUN/Creatinine Ratio: 13 (ref 9–23)
BUN: 12 mg/dL (ref 6–20)
CO2: 21 mmol/L (ref 20–29)
Calcium: 8.9 mg/dL (ref 8.7–10.2)
Chloride: 102 mmol/L (ref 96–106)
Creatinine, Ser: 0.94 mg/dL (ref 0.57–1.00)
Glucose: 110 mg/dL — ABNORMAL HIGH (ref 70–99)
Potassium: 3.5 mmol/L (ref 3.5–5.2)
Sodium: 138 mmol/L (ref 134–144)
eGFR: 79 mL/min/{1.73_m2} (ref >59)

## 2023-07-26 LAB — CBC
Hematocrit: 38.8 % (ref 34.0–46.6)
Hemoglobin: 12.2 g/dL (ref 11.1–15.9)
MCH: 24.6 pg — ABNORMAL LOW (ref 26.6–33.0)
MCHC: 31.4 g/dL — ABNORMAL LOW (ref 31.5–35.7)
MCV: 78 fL — ABNORMAL LOW (ref 79–97)
Platelets: 256 10*3/uL (ref 150–450)
RBC: 4.95 x10E6/uL (ref 3.77–5.28)
RDW: 15 % (ref 11.7–15.4)
WBC: 5 10*3/uL (ref 3.4–10.8)

## 2023-07-26 LAB — MAGNESIUM: Magnesium: 1.7 mg/dL (ref 1.6–2.3)

## 2023-07-26 LAB — TSH RFX ON ABNORMAL TO FREE T4: TSH: 1 u[IU]/mL (ref 0.450–4.500)

## 2023-07-26 NOTE — Progress Notes (Signed)
    SUBJECTIVE:   CHIEF COMPLAINT / HPI:   Recurrent Syncope Patient has longstanding history of recurrent syncope, thought to be vasovagal in origin.  She has been worked up for this in the past including multiple Zio patch as send referral to cardiology.  It is not a daily occurrence but does happen frequently enough to be quite bothersome.  She actually had 2 episodes just last night.  1 during micturition and then another when she was walking from the bathroom back to the bed. Both of these episodes were preceded by typical prodrome of lightheadedness, darkening of vision, and feeling of warmth.  On review, she does endorse that these almost always happen in the evening, never in the middle of the day.  She notes that she has been told to increase her salt intake and fluid intake in the past but finds that when she increases the salt in her diet she gets migraine headaches so this is not a great option for her. In the past, was also thought that anemia was playing into these episodes, but she has since had a hysterectomy and would like to recheck her hemoglobin today.  OBJECTIVE:   BP 98/66   Pulse 78   Wt 203 lb (92.1 kg)   LMP 06/26/2022   SpO2 98%   BMI 38.36 kg/m   Gen: Well appearing and NAD  HENT: MMM Cardio: RRR without murmur Pulm: Normal WOB on RA, speaking in full sentences Abd: Non-tender, non-distended  Ext; Without edema or deformity   ASSESSMENT/PLAN:   Assessment & Plan Syncope, unspecified syncope type Her history certainly sounds like a typical vasovagal episode consistent with prior workup.  EKG in clinic today is benign.  Orthostatic vitals in clinic likewise benign.  At this point, I think that she certainly warrants some sort of intervention given that she has failed more conservative efforts such as increasing fluid and salt intake. -Will start midodrine 5 mg daily at 4 or 5 PM to try and avoid these evening episodes  -CBC, TSH, BMP, magnesium today -Given  a paper prescription for a glucometer. Should this happen again in the future, I would like her to check her blood sugar at the time of the episode as this is one etiology that has not been thoroughly investigated.    Eliezer Mccoy, MD [facility]

## 2023-07-30 ENCOUNTER — Encounter: Payer: Self-pay | Admitting: Student

## 2023-07-31 NOTE — Progress Notes (Signed)
 Cardiology Clinic Note   Date: 08/01/2023 ID: Breanna, Miranda [birth year], MRN [id number]  Primary Cardiologist:  Candyce Reek, MD  Chief Complaint   Breanna Miranda is a 38 y.o. female who presents to the clinic today for evaluation of increased palpitations.   Patient Profile   Breanna Miranda is followed by cardiology for the history outlined below.      Past medical history significant for: Palpitations. 3-day ZIO 04/08/2023: HR 55 to 148 bpm, average 82 bpm.  Predominantly sinus rhythm.  1 short run of SVT lasting 5 beats.  Rare PACs/PVCs.  No sustained arrhythmias. Chest pain/dyspnea. Echo 06/28/2022: EF 60 to 65%.  No RWMA.  Normal diastolic parameters.  Normal RV size/function. GERD. Anemia. Depression. Anxiety. Vasovagal syncope.  In summary, patient was first evaluated by Dr. Reek on 05/31/2022 for palpitations at the request of [name].  Patient reported an episode of fast heart rate of 280 bpm lasting several hours.  She also reported episodes of more brief palpitations.  She has a history of anemia requiring blood transfusions and IV iron  in the setting of dysmenorrhea.  CBC at that time showed hemoglobin 8.6.  Patient wore a ZIO monitor in January 2024 which showed NSR with rare PACs/PVCs and no significant arrhythmias.  Echo showed normal LV/RV function as detailed above.  Patient was evaluated in the emergency department in August 2024 for chest pain and increased palpitations.  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  Troponin was negative x 2.  CBC was stable at [facility] was unremarkable.  She followed up in the office and had a 3-day ZIO placed which showed no sustained arrhythmias as detailed above.  Patient was last seen in the office by [name], NP on 04/18/2023 for follow-up after testing.  She was working on reducing caloric intake and exercising more.  She was advised to  avoid triggers and vagal maneuvers were discussed.  Home sleep study was ordered for STOP-BANG score 4 and complaints of daytime somnolence and snoring.  Patient was evaluated in the ED on 06/19/2023 for palpitations at exertional dyspnea.  Troponin negative x 2.  Potassium 3.  Blood counts stable.  EKG showed sinus rhythm and she was noted to have occasional sinus arrhythmia on the monitor.  Patient was evaluated by PCP on 07/25/2023 for evaluation of syncopal events.  She reported to episodes the evening prior 1 during micturition and 1 when walking back to bed from the bathroom.  Both episodes preceded by typical prodrome of lightheadedness, darkening vision, feeling of warmth.  She reports episodes always happen in the evening and never in the middle of the day.  She had tried increasing sodium intake and hydration but found the increased sodium caused migraines.  In the past episodes were attributed to anemia however since hysterectomy hemoglobin has been stable.     History of Present Illness    Today, patient reports increased palpitations described as irregular pounding that is occurring more frequently than previous. She states when she started to have issues it occurred several times a day. The pounding is now occurring nearly all day. She reports associated shortness of breath that occurs with palpitations. She will have some mild chest tightness on occasion with the palpitations. 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 She is not exercising or being overly active secondary to the palpitations. She  has not taken midodrine  because it makes her nervous and she does not want her BP to get too high.  She has not had a syncopal episode since last week. She reports she hydrates well. Recent labs from PCP reviewed.      ROS: All other systems reviewed and are otherwise negative except as noted in History of Present Illness.  EKGs/Labs Reviewed     EKG Interpretation Date/Time:  Friday August 01 2023 10:24:17 EST Ventricular Rate:  73 PR Interval:  174 QRS Duration:  72 QT Interval:  378 QTC Calculation: 416 R Axis:   32  Text Interpretation: Normal sinus rhythm Normal ECG When compared with ECG of 19-Jun-2023 19:22, No significant change was found Confirmed by Loistine Sober [phone]) on 08/01/2023 10:26:59 AM   12/13/2022: ALT 13; AST 18 07/25/2023: BUN 12; Creatinine, Ser 0.94; Potassium 3.5; Sodium 138   07/25/2023: Hemoglobin 12.2; WBC 5.0   07/25/2023: TSH 1.000    Physical Exam    VS:  BP 96/70 (BP Location: Left Arm, Patient Position: Sitting, Cuff Size: Normal)   Pulse 73   Ht 5' 4 (1.626 m)   Wt 209 lb 8 oz (95 kg)   LMP 06/26/2022   SpO2 96%   BMI 35.96 kg/m  , BMI Body mass index is 35.96 kg/m.  Orthostatic VS for the past 24 hrs (Last 3 readings):  BP- Lying Pulse- Lying BP- Sitting Pulse- Sitting BP- Standing at 0 minutes Pulse- Standing at 0 minutes BP- Standing at 3 minutes Pulse- Standing at 3 minutes  08/01/23 1025 106/70 93 108/73 93 109/76 93 110/78 93     GEN: Well nourished, well developed, in no acute distress. Neck: No JVD or carotid bruits. Cardiac:  RRR. No murmurs. No rubs or gallops.   Respiratory:  Respirations regular and unlabored. Clear to auscultation without rales, wheezing or rhonchi. GI: Soft, nontender, nondistended. Extremities: Radials/DP/PT 2+ and equal bilaterally. No clubbing or cyanosis. No edema.  Skin: Warm and dry, no rash. Neuro: Strength intact.  Assessment & Plan   Palpitations/shortness of breath 3-day ZIO October 2024 showed HR 55 to 148 bpm, average 82 bpm, predominantly sinus rhythm, 1 short run of SVT lasting 5 beats, rare PACs/PVCs.  Patient has a long history of palpitations.  She reports palpitations are getting worse and occurring more frequently. She describes episodes of irregular pounding that last all day with associated shortness of breath and  occasional chest tightness. Labs drawn by PCP showed normal blood counts, kidney function, electrolytes, and thyroid . EKG today shows NSR. No extrasystole on auscultation.  -14-day Zio for further evaluation.  -Echo.   Vasovagal syncope Patient recently evaluated by PCP and started on midodrine  in the evenings.  She reports no further episodes of syncope. She has not taken midodrine  as she is concerned her BP will get too high. She denies lightheadedness or dizziness during the day. Orthostatic vitals were negative. BP 96/70 on intake.  -Consider trialing midodrine  at night to avoid repeat syncope.   Disposition: 14-day Zio, echo. Return in 8-10 weeks or sooner as needed.          Signed, Sober HERO. Milus Fritze, DNP, NP-C

## 2023-08-01 ENCOUNTER — Ambulatory Visit: Payer: Medicaid Other | Attending: Student | Admitting: Student

## 2023-08-01 ENCOUNTER — Encounter: Payer: Self-pay | Admitting: Student

## 2023-08-01 ENCOUNTER — Ambulatory Visit: Payer: Medicaid Other

## 2023-08-01 VITALS — BP 96/70 | HR 73 | Ht 64.0 in | Wt 209.5 lb

## 2023-08-01 DIAGNOSIS — R002 Palpitations: Secondary | ICD-10-CM | POA: Diagnosis not present

## 2023-08-01 DIAGNOSIS — R0602 Shortness of breath: Secondary | ICD-10-CM

## 2023-08-01 DIAGNOSIS — R0600 Dyspnea, unspecified: Secondary | ICD-10-CM

## 2023-08-01 DIAGNOSIS — R55 Syncope and collapse: Secondary | ICD-10-CM | POA: Diagnosis not present

## 2023-08-01 NOTE — Patient Instructions (Signed)
 Medication Instructions:  Your Physician recommend you continue on your current medication as directed.    *If you need a refill on your cardiac medications before your next appointment, please call your pharmacy*   Lab Work: None ordered at this time  If you have labs (blood work) drawn today and your tests are completely normal, you will receive your results only by: MyChart Message (if you have MyChart) OR A paper copy in the mail If you have any lab test that is abnormal or we need to change your treatment, we will call you to review the results.   Testing/Procedures: Your physician has requested that you have an echocardiogram in [place]. Echocardiography is a painless test that uses sound waves to create images of your heart. It provides your doctor with information about the size and shape of your heart and how well your heart's chambers and valves are working.   You may receive an ultrasound enhancing agent through an IV if needed to better visualize your heart during the echo. This procedure takes approximately one hour.  There are no restrictions for this procedure.  This will take place at [address] [facility] [address], [place] [address]  Please note: We ask at that you not bring children with you during ultrasound (echo/ vascular) testing. Due to room size and safety concerns, children are not allowed in the ultrasound rooms during exams. Our front office staff cannot provide observation of children in our lobby area while testing is being conducted. An adult accompanying a patient to their appointment will only be allowed in the ultrasound room at the discretion of the ultrasound technician under special circumstances. We apologize for any inconvenience.   ZIO XT- Long Term Monitor Instructions  Your physician has requested you wear a ZIO patch monitor for 14 days.  This is a single patch monitor. [employer] supplies one patch monitor per enrollment.  Additional stickers are not available. Please do not apply patch if you will be having a Nuclear Stress Test, Echocardiogram, Cardiac CT, MRI, or Chest Xray during the period you would be wearing the monitor. The patch cannot be worn during these tests. You cannot remove and re-apply the ZIO XT patch monitor.  Your ZIO patch monitor will be mailed 3 day USPS to your address on file. It may take 3-5 days to receive your monitor after you have been enrolled.  Once you have received your monitor, please review the enclosed instructions. Your monitor has already been registered assigning a specific monitor serial # to you.  Billing and Patient Assistance Program Information  We have supplied [employer] with any of your insurance information on file for billing purposes. [employer] offers a sliding scale Patient Assistance Program for patients that do not have insurance, or whose insurance does not completely cover the cost of the ZIO monitor.  You must apply for the Patient Assistance Program to qualify for this discounted rate.  To apply, please call [employer] at [phone], select option 4, select option 2, ask to apply for Patient Assistance Program. Mered[employer] will ask your household income, and how many people are in your household. They will quote your out-of-pocket cost based on that information. [employer] will also be able to set up a 61-month, interest-free payment plan if needed.  Applying the monitor   Shave hair from upper left chest.  Hold abrader disc by orange tab. Rub abrader in 40 strokes over the upper left chest as indicated in your monitor instructions.  Clean  area with 4 enclosed alcohol pads. Let dry.  Apply patch as indicated in monitor instructions. Patch will be placed under collarbone on left side of chest with arrow pointing upward.  Rub patch adhesive wings for 2 minutes. Remove white label marked 1. Remove the white label marked 2. Rub patch adhesive wings for 2 additional minutes.   While looking in a mirror, press and release button in center of patch. A small green light will flash 3-4 times. This will be your only indicator that the monitor has been turned on.  Do not shower for the first 24 hours. You may shower after the first 24 hours.  Press the button if you feel a symptom. You will hear a small click. Record Date, Time and Symptom in the Patient Logbook.  When you are ready to remove the patch, follow instructions on the last 2 pages of Patient Logbook. Stick patch monitor onto the last page of Patient Logbook.  Place Patient Logbook in the blue and white box. Use locking tab on box and tape box closed securely. The blue and white box has prepaid postage on it. Please place it in the mailbox as soon as possible. Your physician should have your test results approximately 7 days after the monitor has been mailed back to [facility].  Call [facility] Customer Care at [phone] if you have questions regarding your ZIO XT patch monitor. Call them immediately if you see an orange light blinking on your monitor.  If your monitor falls off in less than 4 days, contact our Monitor department at [phone].  If your monitor becomes loose or falls off after 4 days call [employer] at [phone] for suggestions on securing your monitor.    Follow-Up: At [facility], you and your health needs are our priority.  As part of our continuing mission to provide you with exceptional heart care, we have created designated Provider Care Teams.  These Care Teams include your primary Cardiologist (physician) and Advanced Practice Providers (APPs -  Physician Assistants and Nurse Practitioners) who all work together to provide you with the care you need, when you need it.     Your next appointment:   8-10  week(s)  Provider:   You may see Barnie Hila, NP

## 2023-08-21 ENCOUNTER — Ambulatory Visit (HOSPITAL_COMMUNITY): Payer: Medicaid Other | Attending: Student

## 2023-09-22 DIAGNOSIS — E6609 Other obesity due to excess calories: Secondary | ICD-10-CM | POA: Diagnosis not present

## 2023-09-22 DIAGNOSIS — Z6837 Body mass index (BMI) 37.0-37.9, adult: Secondary | ICD-10-CM | POA: Diagnosis not present

## 2023-09-22 DIAGNOSIS — Z13220 Encounter for screening for lipoid disorders: Secondary | ICD-10-CM | POA: Diagnosis not present

## 2023-09-22 DIAGNOSIS — Z131 Encounter for screening for diabetes mellitus: Secondary | ICD-10-CM | POA: Diagnosis not present

## 2023-09-24 NOTE — Progress Notes (Deleted)
 Cardiology Clinic Note   Date: 09/24/2023 ID: Breanna Miranda, Breanna Miranda 1985/12/14, MRN [id number]  Primary Cardiologist:  Lance Muss, MD  Chief Complaint   Breanna Miranda is a 38 y.o. female who presents to the clinic today for ***  Patient Profile   Breanna Miranda is followed by *** for the history outlined below.       Past medical history significant for: Palpitations. 3-day ZIO 04/08/2023: HR 55 to 148 bpm, average 82 bpm.  Predominantly sinus rhythm.  1 short run of SVT lasting 5 beats.  Rare PACs/PVCs.  No sustained arrhythmias. Chest pain/dyspnea. Echo 06/28/2022: EF 60 to 65%.  No RWMA.  Normal diastolic parameters.  Normal RV size/function. GERD. Anemia. Depression. Anxiety. Vasovagal syncope.  In summary, patient was first evaluated by Dr. Eldridge Dace on 05/31/2022 for palpitations at the request of [name].  Patient reported an episode of fast heart rate of 280 bpm lasting several hours.  She also reported episodes of more brief palpitations.  She has a history of anemia requiring blood transfusions and IV iron in the setting of dysmenorrhea.  CBC at that time showed hemoglobin 8.6.  Patient wore a ZIO monitor in January 2024 which showed NSR with rare PACs/PVCs and no significant arrhythmias.  Echo showed normal LV/RV function as detailed above.  Patient was evaluated in the emergency department in August 2024 for chest pain and increased palpitations.  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  Troponin was negative x 2.  CBC was stable at [facility] was unremarkable.  She followed up in the office and had a 3-day ZIO placed which showed no sustained arrhythmias as detailed above.  Patient was last seen in the office by Edd Fabian, NP on 04/18/2023 for follow-up after testing.  She was working on reducing caloric intake and exercising more.  She was advised to avoid triggers and vagal maneuvers were  discussed.  Home sleep study was ordered for STOP-BANG score 4 and complaints of daytime somnolence and snoring.  Patient was evaluated in the ED on 06/19/2023 for palpitations at exertional dyspnea.  Troponin negative x 2.  Potassium 3.  Blood counts stable.  EKG showed sinus rhythm and she was noted to have occasional sinus arrhythmia on the monitor.  Patient was evaluated by PCP on 07/25/2023 for evaluation of syncopal events.  She reported to episodes the evening prior 1 during micturition and 1 when walking back to bed from the bathroom.  Both episodes preceded by typical prodrome of lightheadedness, darkening vision, feeling of warmth.  She reports episodes always happen in the evening and never in the middle of the day.  She had tried increasing sodium intake and hydration but found the increased sodium caused migraines.  In the past episodes were attributed to anemia however since hysterectomy hemoglobin has been stable.   Patient was last seen in the office by me on 08/01/2023 for increased palpitations.  Patient reported palpitations described as irregular pounding initially occurring several times a day but increased to continuous sensation with associated shortness of breath and occasional chest tightness.  She had not started midodrine secondary to concern for BP getting too high.  She reported episodes of syncope occurred at night and she was encouraged to trial midodrine at night to avoid repeat syncope.  Echo and 14-day ZIO ordered but does not appear to have been completed.     History of Present Illness  Today, patient ***  Palpitations/shortness of breath 3-day ZIO October 2024 showed HR 55 to 148 bpm, average 82 bpm, predominantly sinus rhythm, 1 short run of SVT lasting 5 beats, rare PACs/PVCs.  Patient was seen in February 2025 with complaints of increased palpitations with associated dyspnea.  Echo and 14-day ZIO were ordered but does not appear to have been completed.   Patient*** -***   Vasovagal syncope Patient recently evaluated by PCP and started on midodrine in the evenings.  Patient*** -Continue midodrine in the evenings.***  ROS: All other systems reviewed and are otherwise negative except as noted in History of Present Illness.  EKGs/Labs Reviewed        12/13/2022: ALT 13; AST 18 07/25/2023: BUN 12; Creatinine, Ser 0.94; Potassium 3.5; Sodium 138   07/25/2023: Hemoglobin 12.2; WBC 5.0   07/25/2023: TSH 1.000   No results found for requested labs within last 365 days.  ***  Risk Assessment/Calculations    {Does this patient have ATRIAL FIBRILLATION?[phone]} No BP recorded.  {Refresh Note OR Click here to enter BP  :1}***   STOP-Bang Score:  4  { Consider Dx Sleep Disordered Breathing or Sleep Apnea  ICD G47.33          :1}     Physical Exam    VS:  LMP 06/26/2022  , BMI There is no height or weight on file to calculate BMI.  GEN: Well nourished, well developed, in no acute distress. Neck: No JVD or carotid bruits. Cardiac: *** RRR. No murmurs. No rubs or gallops.   Respiratory:  Respirations regular and unlabored. Clear to auscultation without rales, wheezing or rhonchi. GI: Soft, nontender, nondistended. Extremities: Radials/DP/PT 2+ and equal bilaterally. No clubbing or cyanosis. No edema ***  Skin: Warm and dry, no rash. Neuro: Strength intact.  Assessment & Plan   ***  Disposition: ***     {Are you ordering a CV Procedure (e.g. stress test, cath, DCCV, TEE, etc)?   Press F2        :[id number]}   Signed, Etta Grandchild. Islah Eve, DNP, NP-C

## 2023-09-29 ENCOUNTER — Ambulatory Visit: Payer: Medicaid Other | Attending: Student | Admitting: Student

## 2023-10-20 DIAGNOSIS — Z6837 Body mass index (BMI) 37.0-37.9, adult: Secondary | ICD-10-CM | POA: Diagnosis not present

## 2023-10-20 DIAGNOSIS — E7800 Pure hypercholesterolemia, unspecified: Secondary | ICD-10-CM | POA: Diagnosis not present

## 2023-10-20 DIAGNOSIS — E6609 Other obesity due to excess calories: Secondary | ICD-10-CM | POA: Diagnosis not present

## 2023-11-19 DIAGNOSIS — E6609 Other obesity due to excess calories: Secondary | ICD-10-CM | POA: Diagnosis not present

## 2023-11-19 DIAGNOSIS — E7800 Pure hypercholesterolemia, unspecified: Secondary | ICD-10-CM | POA: Diagnosis not present

## 2023-11-19 DIAGNOSIS — Z6837 Body mass index (BMI) 37.0-37.9, adult: Secondary | ICD-10-CM | POA: Diagnosis not present

## 2023-11-27 ENCOUNTER — Other Ambulatory Visit: Payer: Self-pay

## 2023-12-19 DIAGNOSIS — Z6837 Body mass index (BMI) 37.0-37.9, adult: Secondary | ICD-10-CM | POA: Diagnosis not present

## 2023-12-19 DIAGNOSIS — E7800 Pure hypercholesterolemia, unspecified: Secondary | ICD-10-CM | POA: Diagnosis not present

## 2023-12-19 DIAGNOSIS — E6609 Other obesity due to excess calories: Secondary | ICD-10-CM | POA: Diagnosis not present

## 2023-12-19 DIAGNOSIS — Z6834 Body mass index (BMI) 34.0-34.9, adult: Secondary | ICD-10-CM | POA: Diagnosis not present

## 2024-01-14 ENCOUNTER — Other Ambulatory Visit (HOSPITAL_COMMUNITY)
Admission: RE | Admit: 2024-01-14 | Discharge: 2024-01-14 | Disposition: A | Discharge status: Home / Self Care | Source: Ambulatory Visit | Attending: Family Medicine | Admitting: Family Medicine

## 2024-01-14 ENCOUNTER — Ambulatory Visit

## 2024-01-14 VITALS — BP 101/69 | HR 72 | Ht 64.0 in | Wt 204.2 lb

## 2024-01-14 DIAGNOSIS — Z0000 Encounter for general adult medical examination without abnormal findings: Secondary | ICD-10-CM | POA: Diagnosis not present

## 2024-01-14 DIAGNOSIS — Z23 Encounter for immunization: Secondary | ICD-10-CM

## 2024-01-14 LAB — POCT GLYCOSYLATED HEMOGLOBIN (HGB A1C): Hemoglobin A1C: 5.3 % (ref 4.0–5.6)

## 2024-01-14 MED ORDER — FLUTICASONE PROPIONATE 50 MCG/ACT NA SUSP: 2.0000 | Freq: Every day | NASAL | 6 refills | Status: DC

## 2024-01-14 MED ORDER — PANTOPRAZOLE SODIUM 40 MG PO TBEC: 40.0000 mg | DELAYED_RELEASE_TABLET | Freq: Every day | ORAL | 3 refills | Status: DC

## 2024-01-14 NOTE — Patient Instructions (Addendum)
 It was wonderful to see you today.  Please bring ALL of your medications with you to every visit.   Today we talked about:  Your annual exam. Good job staying on top of your health! See us  back in the clinic in 1 year for your next annual physical and as needed in between. I will contact you about your lab results. Try good sleep hygiene and 3-5mg  of melatonin before bed to help your insomnia. Follow up with cardiology as you mentioned.   Thank you for choosing [facility].   Please call [phone] with any questions about today's appointment.  Please arrive at least 15 minutes prior to your scheduled appointments.   If you had blood work today, I will send you a MyChart message or a letter if results are normal. Otherwise, I will give you a call.   If you had a referral placed, they will call you to set up an appointment. Please give us  a call if you don't hear back in the next 2 weeks.   If you need additional refills before your next appointment, please call your pharmacy first.   You should follow up in our clinic in 1 year and as needed.  Breanna Dixons, DO Family Medicine   Insomnia Insomnia is a sleep disorder that makes it difficult to fall asleep or stay asleep. Insomnia can cause fatigue, low energy, difficulty concentrating, mood swings, and poor performance at work or school. There are three different ways to classify insomnia: Difficulty falling asleep. Difficulty staying asleep. Waking up too early in the morning. Any type of insomnia can be long-term (chronic) or short-term (acute). Both are common. Short-term insomnia usually lasts for 3 months or less. Chronic insomnia occurs at least three times a week for longer than 3 months. What are the causes? Insomnia may be caused by another condition, situation, or substance, such as: Having certain mental health conditions, such as anxiety and depression. Using caffeine, alcohol, tobacco, or drugs. Having  gastrointestinal conditions, such as gastroesophageal reflux disease (GERD). Having certain medical conditions. These include: Asthma. Alzheimer's disease. Stroke. Chronic pain. An overactive thyroid  gland (hyperthyroidism). Other sleep disorders, such as restless legs syndrome and sleep apnea. Menopause. Sometimes, the cause of insomnia may not be known. What increases the risk? Risk factors for insomnia include: Gender. Females are affected more often than males. Age. Insomnia is more common as people get older. Stress and certain medical and mental health conditions. Lack of exercise. Having an irregular work schedule. This may include working night shifts and traveling between different time zones. What are the signs or symptoms? If you have insomnia, the main symptom is having trouble falling asleep or having trouble staying asleep. This may lead to other symptoms, such as: Feeling tired or having low energy. Feeling nervous about going to sleep. Not feeling rested in the morning. Having trouble concentrating. Feeling irritable, anxious, or depressed. How is this diagnosed? This condition may be diagnosed based on: Your symptoms and medical history. Your health care provider may ask about: Your sleep habits. Any medical conditions you have. Your mental health. A physical exam. How is this treated? Treatment for insomnia depends on the cause. Treatment may focus on treating an underlying condition that is causing the insomnia. Treatment may also include: Medicines to help you sleep. Counseling or therapy. Lifestyle adjustments to help you sleep better. Follow these instructions at home: Eating and drinking  Limit or avoid alcohol, caffeinated beverages, and products that contain nicotine  and tobacco, especially close to bedtime. These can disrupt your sleep. Do not eat a large meal or eat spicy foods right before bedtime. This can lead to digestive discomfort that can make  it hard for you to sleep. Sleep habits  Keep a sleep diary to help you and your health care provider figure out what could be causing your insomnia. Write down: When you sleep. When you wake up during the night. How well you sleep and how rested you feel the next day. Any side effects of medicines you are taking. What you eat and drink. Make your bedroom a dark, comfortable place where it is easy to fall asleep. Put up shades or blackout curtains to block light from outside. Use a white noise machine to block noise. Keep the temperature cool. Limit screen use before bedtime. This includes: Not watching TV. Not using your smartphone, tablet, or computer. Stick to a routine that includes going to bed and waking up at the same times every day and night. This can help you fall asleep faster. Consider making a quiet activity, such as reading, part of your nighttime routine. Try to avoid taking naps during the day so that you sleep better at night. Get out of bed if you are still awake after 15 minutes of trying to sleep. Keep the lights down, but try reading or doing a quiet activity. When you feel sleepy, go back to bed. General instructions Take over-the-counter and prescription medicines only as told by your health care provider. Exercise regularly as told by your health care provider. However, avoid exercising in the hours right before bedtime. Use relaxation techniques to manage stress. Ask your health care provider to suggest some techniques that may work well for you. These may include: Breathing exercises. Routines to release muscle tension. Visualizing peaceful scenes. Make sure that you drive carefully. Do not drive if you feel very sleepy. Keep all follow-up visits. This is important. Contact a health care provider if: You are tired throughout the day. You have trouble in your daily routine due to sleepiness. You continue to have sleep problems, or your sleep problems get  worse. Get help right away if: You have thoughts about hurting yourself or someone else. Get help right away if you feel like you may hurt yourself or others, or have thoughts about taking your own life. Go to your nearest emergency room or: Call 911. Call the National Suicide Prevention Lifeline at 807 161 3317 or 988. This is open 24 hours a day. Text the Crisis Text Line at (316)163-6688. Summary Insomnia is a sleep disorder that makes it difficult to fall asleep or stay asleep. Insomnia can be long-term (chronic) or short-term (acute). Treatment for insomnia depends on the cause. Treatment may focus on treating an underlying condition that is causing the insomnia. Keep a sleep diary to help you and your health care provider figure out what could be causing your insomnia. This information is not intended to replace advice given to you by your health care provider. Make sure you discuss any questions you have with your health care provider. Document Revised: 05/21/2021 Document Reviewed: 05/21/2021 Elsevier Patient Education  2024 [employer].

## 2024-01-14 NOTE — Progress Notes (Addendum)
 Complete physical exam  Patient: Breanna Miranda   DOB: 1986-06-20   38 y.o. Female  MRN: [id number]  Subjective:    Chief Complaint  Patient presents with   Annual Exam    Jumping out of her sleep, heart palpitations but is seeing cardio, clearing throat and sob    Gynecologic Exam    Breanna Miranda is a 38 y.o. female who presents today for a complete physical exam. She reports consuming a diet high in fast food or whatever is easiest. She sees [facility] for weight loss and is was on [place] but hasn't taken it in 2 weeks. Unfortunately her son passed away last month and this has given a lot of stress and grief. The patient has a physically strenuous job, but has no regular exercise apart from work.  She generally feels fairly well but reports sleeping poorly since the passing of her son.   Patient Care Team: Lonnie Earnest, MD as PCP - General (Family Medicine) Dann Candyce RAMAN, MD as PCP - Cardiology (Cardiology)   ROS + for SOB, palpitations + for insomnia - for CP, nausea, vomiting, diaphoresis   Objective:     BP 101/69   Pulse 72   Ht 5' 4 (1.626 m)   Wt 204 lb 3.2 oz (92.6 kg)   LMP 06/26/2022   SpO2 100%   BMI 35.05 kg/m  Wt Readings from Last 3 Encounters:  01/14/24 204 lb 3.2 oz (92.6 kg)  08/01/23 209 lb 8 oz (95 kg)  07/25/23 203 lb (92.1 kg)    Physical Exam  General: A&O, NAD, pleasant HEENT: No sign of trauma, EOM grossly intact, PERRLA, clear nares, clear posterior oropharynx without erythema or exudate, full ROM of head and neck, supple neck, no cervical lymphadenopathy, no goiter or neck mass Cardiac: RRR, no m/r/g Respiratory: CTAB, normal WOB, no w/c/r, good air movement throughout all lung fields GI: Soft, NTTP, non-distended, no masses, no rebound, no guarding Extremities: NTTP, no peripheral edema Neuro: Normal gait, moves all four extremities appropriately, CN II-XII intact Psych: Appropriate mood and affect   GU: GU:  normal appearing external genitalia without lesions. Vagina is moist without discharge. Cervix normal in appearance. No cervical motion tenderness or tenderness on bimanual exam. No adnexal masses. Chaperone present: Cassell Mary, CMA    Assessment & Plan:    Routine Health Maintenance and Physical Exam  Immunization History  Administered Date(s) Administered   HPV 9-valent 01/14/2024   Hepatitis B, PED/ADOLESCENT 03/29/1996, 05/10/1996, 09/28/1996   Influenza,inj,Quad PF,6+ Mos 04/26/2021   MMR 10/09/2020   PPD Test 10/25/2019, 08/21/2021, 03/31/2023   Tdap 07/13/2020    Health Maintenance  Topic Date Due   Cervical Cancer Screening (Pap smear)  08/14/2023   HPV VACCINES (2 - 3-dose SCDM series) 02/11/2024   INFLUENZA VACCINE  01/23/2024   DTaP/Tdap/Td (2 - Td or Tdap) 07/13/2030   Hepatitis B Vaccines  Completed   Hepatitis C Screening  Completed   HIV Screening  Completed   Meningococcal B Vaccine  Aged Out   COVID-19 Vaccine  Discontinued    Problem List Items Addressed This Visit       Other   Palpitations   Other Visit Diagnoses       Encounter for annual health examination    -  Primary   Relevant Orders   Cytology - PAP([place])   CBC   Basic Metabolic Panel   Lipid Panel   TSH   HgB A1c (  Completed)   MM 3D SCREENING MAMMOGRAM BILATERAL BREAST     Encounter for immunization       Relevant Orders   HPV 9-valent vaccine,Recombinat (Completed)     All annual lab work ordered. Will contact patient with results.  Annual mammogram order sent.  HPV vaccine given in office today.  Take 3-5mg  of melatonin at bedtime to help with sleep maintenance insomnia. Speak to a grief counselor about the passing of your son.  Discussed health benefits of physical activity, and encouraged her to engage in regular exercise appropriate for her age and condition. Medication refills sent.  Patient is seeing her cardiologist next month to discuss SOB and palpitations. She  denies any symptoms during the office visit.  Return in about 2 weeks (around 01/28/2024) for insomnia. Follow up with PCP.     Camie Dixons, DO

## 2024-01-15 ENCOUNTER — Ambulatory Visit: Payer: Self-pay | Admitting: Family Medicine

## 2024-01-15 LAB — CBC
Hematocrit: 39.9 % (ref 34.0–46.6)
Hemoglobin: 13.1 g/dL (ref 11.1–15.9)
MCH: 26.4 pg — ABNORMAL LOW (ref 26.6–33.0)
MCHC: 32.8 g/dL (ref 31.5–35.7)
MCV: 80 fL (ref 79–97)
Platelets: 266 x10E3/uL (ref 150–450)
RBC: 4.96 x10E6/uL (ref 3.77–5.28)
RDW: 14.3 % (ref 11.7–15.4)
WBC: 5 x10E3/uL (ref 3.4–10.8)

## 2024-01-15 LAB — LIPID PANEL
Chol/HDL Ratio: 3.1 ratio (ref 0.0–4.4)
Cholesterol, Total: 181 mg/dL (ref 100–199)
HDL: 59 mg/dL (ref >39)
LDL Chol Calc (NIH): 102 mg/dL — ABNORMAL HIGH (ref 0–99)
Triglycerides: 111 mg/dL (ref 0–149)
VLDL Cholesterol Cal: 20 mg/dL (ref 5–40)

## 2024-01-15 LAB — BASIC METABOLIC PANEL WITH GFR
BUN/Creatinine Ratio: 10 (ref 9–23)
BUN: 9 mg/dL (ref 6–24)
CO2: 23 mmol/L (ref 20–29)
Calcium: 9.5 mg/dL (ref 8.7–10.2)
Chloride: 102 mmol/L (ref 96–106)
Creatinine, Ser: 0.93 mg/dL (ref 0.57–1.00)
Glucose: 83 mg/dL (ref 70–99)
Potassium: 4.7 mmol/L (ref 3.5–5.2)
Sodium: 137 mmol/L (ref 134–144)
eGFR: 80 mL/min/1.73 (ref >59)

## 2024-01-15 LAB — TSH: TSH: 1.09 u[IU]/mL (ref 0.450–4.500)

## 2024-01-19 DIAGNOSIS — E7800 Pure hypercholesterolemia, unspecified: Secondary | ICD-10-CM | POA: Diagnosis not present

## 2024-01-19 DIAGNOSIS — Z6834 Body mass index (BMI) 34.0-34.9, adult: Secondary | ICD-10-CM | POA: Diagnosis not present

## 2024-01-19 DIAGNOSIS — E6609 Other obesity due to excess calories: Secondary | ICD-10-CM | POA: Diagnosis not present

## 2024-01-19 DIAGNOSIS — Z6837 Body mass index (BMI) 37.0-37.9, adult: Secondary | ICD-10-CM | POA: Diagnosis not present

## 2024-01-21 LAB — CYTOLOGY - PAP
Adequacy: ABSENT
Comment: NEGATIVE
Comment: NEGATIVE
Comment: NEGATIVE
Diagnosis: NEGATIVE
HPV 16: NEGATIVE
HPV 18 / 45: NEGATIVE
High risk HPV: POSITIVE — AB

## 2024-01-22 NOTE — Telephone Encounter (Signed)
 Spoke to patient about cervical cancer screening results. She is agreeable to colposcopy at this time. Burnard will call with an appointment for colposcopy clinic.

## 2024-01-25 ENCOUNTER — Encounter: Payer: Self-pay | Admitting: Family Medicine

## 2024-01-26 MED ORDER — METRONIDAZOLE 500 MG PO TABS: 500.0000 mg | ORAL_TABLET | Freq: Two times a day (BID) | ORAL | 0 refills | Status: DC

## 2024-02-11 ENCOUNTER — Ambulatory Visit: Admitting: Student

## 2024-02-12 ENCOUNTER — Ambulatory Visit (INDEPENDENT_AMBULATORY_CARE_PROVIDER_SITE_OTHER): Admitting: Family Medicine

## 2024-02-12 VITALS — BP 110/74 | HR 66 | Ht 64.0 in | Wt 203.6 lb

## 2024-02-12 DIAGNOSIS — R87810 Cervical high risk human papillomavirus (HPV) DNA test positive: Secondary | ICD-10-CM

## 2024-02-12 DIAGNOSIS — Z90710 Acquired absence of both cervix and uterus: Secondary | ICD-10-CM

## 2024-02-12 NOTE — Progress Notes (Unsigned)
 COLPOSCOPY CLINIC:  HPI: Patient here for further evaluation abnormal cytology on pap smear.   PERTINENT  PMH / PSH: I have reviewed the patient's medications, allergies, past medical and surgical history, smoking status.  Pertinent findings that relate to today's visit / issues include:  01/14/24 09:40  Adequacy Satisfactory for evaluation; transformation zone component ABSENT.  HIGH RISK HPV ([place]): Positive Abnormal  YPV 16 ([place]): Negative HPV 18 / 45 ([place]): Negative ...............................................................................  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 ENDOMETRIUM, POLYPECTOMY: Benign endometrial polyp Benign endometrium with marked progestational effect Negative for hyperplasia and carcinoma   ................................................................................. PROCEDURE NOTE: COLPOSCOPIC EXAM Patient given informed consent, signed copy in the chart.  Placed in lithotomy position. Vaginal cuff from prior hysterectomy  viewed with speculum and colposcope . Small amount of thin white discharge noted but easily removed with Fox swab. The vaginal cuff was somewhat redundant but no cervix in evidence, consistent with prior hx  hysterectomy.   ...........................................................................SABRA ASSESSMENT/PLAN  pt had attempted pap at prior visit however  she is s/p hysterectomy. -  cytology from that visit/procedure revealed absent transformation zone (as expected). 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  Colposcopic exam today confirms absence of cervix, (She has some redundant issue at the vaginal cuff so I can understand the confusion). Will update chart She needs no  more cervical cytology surveillance (no paps) Discussed this with patient and explained.  No further testing or follow up indicated for the HPV seen on smear.

## 2024-02-15 NOTE — Progress Notes (Unsigned)
 Cardiology Clinic Note   Date: 02/19/2024 ID: Dorrie, Cocuzza 1985-10-03, MRN [id number]  Primary Cardiologist:  Candyce Reek, MD  Chief Complaint   Breanna Miranda is a 38 y.o. female who presents to the clinic today for follow up after echo.   Patient Profile   Breanna Miranda is followed by cardiology for the history outlined below.      Past medical history significant for: Palpitations. 3-day ZIO 04/08/2023: HR 55 to 148 bpm, average 82 bpm.  Predominantly sinus rhythm.  1 short run of SVT lasting 5 beats.  Rare PACs/PVCs.  No sustained arrhythmias. Chest pain/dyspnea. Echo 02/18/2024: EF 55 to 60%.  No RWMA.  Normal diastolic parameters.  Normal global strain.  Normal RV size/function.  Mild MR. GERD. Anemia. Depression. Anxiety. Vasovagal syncope.   In summary, patient was first evaluated by Dr. Reek on 05/31/2022 for palpitations at the request of [name].  Patient reported an episode of fast heart rate of 280 bpm lasting several hours.  She also reported episodes of more brief palpitations.  She has a history of anemia requiring blood transfusions and IV iron  in the setting of dysmenorrhea.  CBC at that time showed hemoglobin 8.6.  Patient wore a ZIO monitor in January 2024 which showed NSR with rare PACs/PVCs and no significant arrhythmias.  Echo showed normal LV/RV function as detailed above.  Patient was evaluated in the emergency department in August 2024 for chest pain and increased palpitations.  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  Troponin was negative x 2.  CBC was stable at [facility] was unremarkable.  She followed up in the office and had a 3-day ZIO placed which showed no sustained arrhythmias as detailed above.   Patient was last seen in the office by [name], NP on 04/18/2023 for follow-up after testing.  She was working on reducing caloric intake and exercising more.   She was advised to avoid triggers and vagal maneuvers were discussed.  Home sleep study was ordered for STOP-BANG score 4 and complaints of daytime somnolence and snoring.   Patient was evaluated in the ED on 06/19/2023 for palpitations at exertional dyspnea.  Troponin negative x 2.  Potassium 3.  Blood counts stable.  EKG showed sinus rhythm and she was noted to have occasional sinus arrhythmia on the monitor.   Patient was evaluated by PCP on 07/25/2023 for evaluation of syncopal events.  She reported to episodes the evening prior 1 during micturition and 1 when walking back to bed from the bathroom.  Both episodes preceded by typical prodrome of lightheadedness, darkening vision, feeling of warmth.  She reports episodes always happen in the evening and never in the middle of the day.  She had tried increasing sodium intake and hydration but found the increased sodium caused migraines.  In the past episodes were attributed to anemia however since hysterectomy hemoglobin has been stable.    Patient was last seen in the office by me on 08/01/2023 for increased palpitations.  Patient reported palpitations described as irregular pounding initially occurring several times a day but increased to continuous sensation with associated shortness of breath and occasional chest tightness.  She had not started midodrine  secondary to concern for BP getting too high.  She reported episodes of syncope occurred at night and she was encouraged to trial midodrine  at night to avoid repeat syncope.  14-day ZIO ordered but completed.  History of Present Illness    Today, patient reports continued palpitations unchanged from previous. She is unsure if she ever received the heart monitor ordered in February. She reports she has episodes of waking up in the middle of the night gasping for breath. IT does not happen every night but when it does she feels her heart pounding. She does not normally have shortness of breath. No  lower extremity edema or orthopnea. She does snore at night and have daytime somnolence. She is not taking midodrine , as she felt jittery on it. Discussed results of echo in detail. All questions answered. Tragically, she lost her 86 year old son in June.     ROS: All other systems reviewed and are otherwise negative except as noted in History of Present Illness.  EKGs/Labs Reviewed    EKG Interpretation Date/Time:  Thursday February 19 2024 15:48:20 EDT Ventricular Rate:  77 PR Interval:  194 QRS Duration:  62 QT Interval:  374 QTC Calculation: 423 R Axis:   22  Text Interpretation: Normal sinus rhythm Nonspecific ST abnormality When compared with ECG of 01-Aug-2023 10:24, No significant change was found Confirmed by Loistine Sober [phone]) on 02/19/2024 3:53:53 PM   01/14/2024: BUN 9; Creatinine, Ser 0.93; Potassium 4.7; Sodium 137   01/14/2024: Hemoglobin 13.1; WBC 5.0   01/14/2024: TSH 1.090    Risk Assessment/Calculations         STOP-Bang Score:  4       Physical Exam    VS:  BP 100/70 (BP Location: Left Arm, Patient Position: Sitting, Cuff Size: Normal)   Pulse 77   Ht 5' 4 (1.626 m)   Wt 206 lb (93.4 kg)   LMP 06/26/2022   SpO2 100%   BMI 35.36 kg/m  , BMI Body mass index is 35.36 kg/m.  GEN: Well nourished, well developed, in no acute distress. Neck: No JVD or carotid bruits. Cardiac:  RRR.  No murmur. No rubs or gallops.   Respiratory:  Respirations regular and unlabored. Clear to auscultation without rales, wheezing or rhonchi. GI: Soft, nontender, nondistended. Extremities: Radials/DP/PT 2+ and equal bilaterally. No clubbing or cyanosis. No edema.  Skin: Warm and dry, no rash. Neuro: Strength intact.  Assessment & Plan   Palpitations/PND 3-day ZIO October 2024 showed HR 55 to 148 bpm, average 82 bpm, predominantly sinus rhythm, 1 short run of SVT lasting 5 beats, rare PACs/PVCs.  Echo 02/18/2024 demonstrated normal LV/RV function with normal diastolic  parameters and mild MR.  Patient reports continued episodes of palpitations that can occur all day long. She is not sure if she ever received the outpatient heart monitor ordered in February. She also describes episodes of waking up gasping for breath and her heart pounding. No lower extremity edema or orthopnea.  - Reorder 14 day Zio.   Sleep disordered breathing Patient reports episodes of waking up gasping for breath. Upon further questioning she reports snoring at night and day time somnolence. Stop bang score 4.  - Refer to pulmonology.    Vasovagal syncope Patient recently evaluated by PCP and started on midodrine  in the evenings.  Patient tired taking the midodrine  but it made her feel jittery so she did not take it again. No further syncopal events reported.  -Continue to monitor.   Disposition: 14-day Zio. Refer to pulmonology. Return in 3 months.          Signed, Sober HERO. Atalya Dano, DNP, NP-C

## 2024-02-16 DIAGNOSIS — Z6834 Body mass index (BMI) 34.0-34.9, adult: Secondary | ICD-10-CM | POA: Diagnosis not present

## 2024-02-16 DIAGNOSIS — Z6837 Body mass index (BMI) 37.0-37.9, adult: Secondary | ICD-10-CM | POA: Diagnosis not present

## 2024-02-16 DIAGNOSIS — E6609 Other obesity due to excess calories: Secondary | ICD-10-CM | POA: Diagnosis not present

## 2024-02-16 DIAGNOSIS — E7800 Pure hypercholesterolemia, unspecified: Secondary | ICD-10-CM | POA: Diagnosis not present

## 2024-02-18 ENCOUNTER — Ambulatory Visit: Attending: Student

## 2024-02-18 DIAGNOSIS — R0602 Shortness of breath: Secondary | ICD-10-CM | POA: Diagnosis not present

## 2024-02-18 DIAGNOSIS — R002 Palpitations: Secondary | ICD-10-CM | POA: Diagnosis not present

## 2024-02-18 LAB — ECHOCARDIOGRAM COMPLETE
AR max vel: 2.94 cm2
AV Area VTI: 2.72 cm2
AV Area mean vel: 2.74 cm2
AV Mean grad: 3 mmHg
AV Peak grad: 5.6 mmHg
Ao pk vel: 1.18 m/s
Area-P 1/2: 4.21 cm2
S' Lateral: 2.74 cm

## 2024-02-19 ENCOUNTER — Ambulatory Visit: Payer: Self-pay | Admitting: Student

## 2024-02-19 ENCOUNTER — Ambulatory Visit: Attending: Student | Admitting: Student

## 2024-02-19 ENCOUNTER — Encounter: Payer: Self-pay | Admitting: Student

## 2024-02-19 ENCOUNTER — Ambulatory Visit

## 2024-02-19 VITALS — BP 100/70 | HR 77 | Ht 64.0 in | Wt 206.0 lb

## 2024-02-19 DIAGNOSIS — R002 Palpitations: Secondary | ICD-10-CM | POA: Insufficient documentation

## 2024-02-19 DIAGNOSIS — R0600 Dyspnea, unspecified: Secondary | ICD-10-CM | POA: Insufficient documentation

## 2024-02-19 DIAGNOSIS — G4730 Sleep apnea, unspecified: Secondary | ICD-10-CM | POA: Diagnosis not present

## 2024-02-19 NOTE — Patient Instructions (Addendum)
 Medication Instructions:  Your physician recommends that you continue on your current medications as directed. Please refer to the Current Medication list given to you today.   *If you need a refill on your cardiac medications before your next appointment, please call your pharmacy*  Lab Work: None ordered at this time   Testing/Procedures: Your physician has recommended that you wear a Zio monitor.   This monitor is a medical device that records the heart's electrical activity. Doctors most often use these monitors to diagnose arrhythmias. Arrhythmias are problems with the speed or rhythm of the heartbeat. The monitor is a small device applied to your chest. You can wear one while you do your normal daily activities. While wearing this monitor if you have any symptoms to push the button and record what you felt. Once you have worn this monitor for the period of time provider prescribed (14 days), you will return the monitor device in the postage paid box. Once it is returned they will download the data collected and provide us  with a report which the provider will then review and we will call you with those results. Important tips:  Avoid showering during the first 24 hours of wearing the monitor. Avoid excessive sweating to help maximize wear time. Do not submerge the device, no hot tubs, and no swimming pools. Keep any lotions or oils away from the patch. After 24 hours you may shower with the patch on. Take brief showers with your back facing the shower head.  Do not remove patch once it has been placed because that will interrupt data and decrease adhesive wear time. Push the button when you have any symptoms and write down what you were feeling. Once you have completed wearing your monitor, remove and place into box which has postage paid and place in your outgoing mailbox.  If for some reason you have misplaced your box then call our office and we can provide another box and/or mail it off  for you.   Follow-Up: At [facility], you and your health needs are our priority.  As part of our continuing mission to provide you with exceptional heart care, our providers are all part of one team.  This team includes your primary Cardiologist (physician) and Advanced Practice Providers or APPs (Physician Assistants and Nurse Practitioners) who all work together to provide you with the care you need, when you need it.  Your next appointment:   3 month(s)  Provider:   You may see Candyce Reek, MD or Barnie Hila, NP

## 2024-02-26 ENCOUNTER — Ambulatory Visit (INDEPENDENT_AMBULATORY_CARE_PROVIDER_SITE_OTHER): Admitting: Sleep Medicine

## 2024-02-26 ENCOUNTER — Encounter: Payer: Self-pay | Admitting: Sleep Medicine

## 2024-02-26 VITALS — BP 118/80 | HR 71 | Temp 98.8°F | Ht 64.0 in | Wt 205.6 lb

## 2024-02-26 DIAGNOSIS — G4733 Obstructive sleep apnea (adult) (pediatric): Secondary | ICD-10-CM | POA: Diagnosis not present

## 2024-02-26 DIAGNOSIS — E669 Obesity, unspecified: Secondary | ICD-10-CM | POA: Diagnosis not present

## 2024-02-26 DIAGNOSIS — Z6835 Body mass index (BMI) 35.0-35.9, adult: Secondary | ICD-10-CM | POA: Diagnosis not present

## 2024-02-26 NOTE — Patient Instructions (Signed)
 SABRA

## 2024-02-26 NOTE — Progress Notes (Signed)
 Name:Breanna Miranda MRN: [id number] DOB: Feb 11, 1986   CHIEF COMPLAINT:  EXCESSIVE DAYTIME SLEEPINESS   HISTORY OF PRESENT ILLNESS: [name] is a 38 y.o. w/ a h/o obesity who presents for c/o loud snoring and excessive daytime sleepiness which has been present for several years. Reports nocturnal awakenings due to nocturia and has difficulty falling back to sleep. Reports a 20 lb weight loss. Denies morning headaches, RLS symptoms, dream enactment, cataplexy, hypnagogic or hypnapompic hallucinations. Reports a family history of sleep apnea. Denies drowsy driving. Occasional alcohol use, denies tobacco or illicit drug use.   Bedtime 9-11 pm Sleep onset 10 mins Rise time 5-6 am   EPWORTH SLEEP SCORE 18    02/26/2024    3:00 PM  Results of the Epworth flowsheet  Sitting and reading 2  Watching TV 3  Sitting, inactive in a public place (e.g. a theatre or a meeting) 3  As a passenger in a car for an hour without a break 2  Lying down to rest in the afternoon when circumstances permit 2  Sitting and talking to someone 2  Sitting quietly after a lunch without alcohol 3  In a car, while stopped for a few minutes in traffic 1  Total score 18    PAST MEDICAL HISTORY :   has a past medical history of Anemia, Anxiety, Asthma, Blood transfusion without reported diagnosis (2016), Chiari I malformation (HCC), Complication of anesthesia, COVID (07/14/2020), Depression, Dyspnea, Empty sella (HCC), Family history of adverse reaction to anesthesia, GERD (gastroesophageal reflux disease), Irregular heart rhythm, Low grade squamous intraepith lesion on cytologic smear cervix (lgsil) (01/15/2018), and Menorrhagia.  has a past surgical history that includes Cholecystectomy; Tubal ligation; Breast biopsy (Left, 12/2020); and Vaginal hysterectomy (Bilateral, 09/17/2022). Prior to Admission medications   Medication Sig Start Date End Date Taking? Authorizing Provider  albuterol  (VENTOLIN   HFA) 108 (90 Base) MCG/ACT inhaler Inhale 1-2 puffs into the lungs every 6 (six) hours as needed. 06/26/23  Yes Mayer, Jodi R, NP  cholecalciferol (VITAMIN D3) 25 MCG (1000 UNIT) tablet Take 2,000 Units by mouth daily.   Yes [provider]  Cyanocobalamin  (VITAMIN B-12 PO) Take by mouth.   Yes [provider]  fluticasone  (FLONASE ) 50 MCG/ACT nasal spray Place 2 sprays into both nostrils daily. 01/14/24  Yes Majeed, Camie, DO  Multiple Vitamins-Minerals (ZINC PO) Take 1 tablet by mouth in the morning.   Yes [provider]  ondansetron  (ZOFRAN -ODT) 8 MG disintegrating tablet Take 8 mg by mouth every 8 (eight) hours as needed. 01/08/24  Yes [provider]  pantoprazole  (PROTONIX ) 40 MG tablet Take 1 tablet (40 mg total) by mouth daily. 01/14/24  Yes Majeed, Camie, DO  WEGOVY 0.5 MG/0.5ML SOAJ SQ injection Inject 0.5 mg into the skin once a week. 02/18/24  Yes [provider]  montelukast  (SINGULAIR ) 10 MG tablet Take 1 tablet (10 mg total) by mouth at bedtime. 05/12/18 08/02/19  Anders Otto DASEN, MD   Allergies  Allergen Reactions   Penicillins Anaphylaxis, Hives and Itching    Has patient had a PCN reaction causing immediate rash, facial/tongue/throat swelling, SOB or lightheadedness with hypotension: Yes Has patient had a PCN reaction causing severe rash involving mucus membranes or skin necrosis: No Has patient had a PCN reaction that required hospitalization No Has patient had a PCN reaction occurring within the last 10 years: Yes If all of the above answers are NO, then may proceed with Cephalosporin use.  Shrimp [Shellfish Allergy] Anaphylaxis, Hives and Itching    FAMILY HISTORY:  family history includes Diabetes in her father; Heart attack (age of onset: 74) in her maternal grandmother; Hypertension in her mother; Kidney disease in her father. SOCIAL HISTORY:  reports that she quit smoking about 16 years ago. Her smoking use included  cigarettes. She started smoking about 18 years ago. She has a 0.2 pack-year smoking history. She has been exposed to tobacco smoke. She has never used smokeless tobacco. She reports current alcohol use of about 1.0 standard drink of alcohol per week. She reports that she does not use drugs.   Review of Systems:  Gen:  Denies  fever, sweats, chills weight loss  HEENT: Denies blurred vision, double vision, ear pain, eye pain, hearing loss, nose bleeds, sore throat Cardiac:  No dizziness, chest pain or heaviness, chest tightness,edema, No JVD Resp:   No cough, -sputum production, -shortness of breath,-wheezing, -hemoptysis,  Gi: Denies swallowing difficulty, stomach pain, nausea or vomiting, diarrhea, constipation, bowel incontinence Gu:  Denies bladder incontinence, burning urine Ext:   Denies Joint pain, stiffness or swelling Skin: Denies  skin rash, easy bruising or bleeding or hives Endoc:  Denies polyuria, polydipsia , polyphagia or weight change Psych:   Denies depression, insomnia or hallucinations  Other:  All other systems negative  VITAL SIGNS: BP 118/80   Pulse 71   Temp 98.8 F (37.1 C)   Ht 5' 4 (1.626 m)   Wt 205 lb 9.6 oz (93.3 kg)   LMP 06/26/2022   SpO2 99%   BMI 35.29 kg/m    Physical Examination:   General Appearance: No distress  EYES PERRLA, EOM intact.   NECK Supple, No JVD Pulmonary: normal breath sounds, No wheezing.  CardiovascularNormal S1,S2.  No m/r/g.   Abdomen: Benign, Soft, non-tender. Skin:   warm, no rashes, no ecchymosis  Extremities: normal, no cyanosis, clubbing. Neuro:without focal findings,  speech normal  PSYCHIATRIC: Mood, affect within normal limits.   ASSESSMENT AND PLAN  OSA I suspect that OSA is likely present due to clinical presentation. Discussed the consequences of untreated sleep apnea. Advised not to drive drowsy for safety of patient and others. Will complete further evaluation with a home sleep study and follow up to  review results.    Obesity Counseled patient on diet and lifestyle modification.    MEDICATION ADJUSTMENTS/LABS AND TESTS ORDERED: Recommend Sleep Study   Patient  satisfied with Plan of action and management. All questions answered  Follow up to review HST results and treatment plan.   I spent a total of 30 minutes reviewing chart data, face-to-face evaluation with the patient, counseling and coordination of care as detailed above.    Darrion Wyszynski, M.D.  Sleep Medicine [facility]

## 2024-03-18 DIAGNOSIS — Z6834 Body mass index (BMI) 34.0-34.9, adult: Secondary | ICD-10-CM | POA: Diagnosis not present

## 2024-03-18 DIAGNOSIS — E7800 Pure hypercholesterolemia, unspecified: Secondary | ICD-10-CM | POA: Diagnosis not present

## 2024-03-18 DIAGNOSIS — E6609 Other obesity due to excess calories: Secondary | ICD-10-CM | POA: Diagnosis not present

## 2024-03-18 DIAGNOSIS — Z6837 Body mass index (BMI) 37.0-37.9, adult: Secondary | ICD-10-CM | POA: Diagnosis not present

## 2024-04-14 ENCOUNTER — Other Ambulatory Visit: Payer: Self-pay | Admitting: Family Medicine

## 2024-05-10 ENCOUNTER — Ambulatory Visit
Admission: EM | Admit: 2024-05-10 | Discharge: 2024-05-10 | Disposition: A | Discharge status: Home / Self Care | Attending: Urgent Care | Admitting: Urgent Care

## 2024-05-10 DIAGNOSIS — K219 Gastro-esophageal reflux disease without esophagitis: Secondary | ICD-10-CM

## 2024-05-10 DIAGNOSIS — G44209 Tension-type headache, unspecified, not intractable: Secondary | ICD-10-CM | POA: Diagnosis not present

## 2024-05-10 DIAGNOSIS — K529 Noninfective gastroenteritis and colitis, unspecified: Secondary | ICD-10-CM | POA: Diagnosis not present

## 2024-05-10 MED ORDER — LOPERAMIDE HCL 2 MG PO CAPS: 2.0000 mg | ORAL_CAPSULE | Freq: Two times a day (BID) | ORAL | 0 refills | Status: AC | PRN

## 2024-05-10 MED ORDER — FAMOTIDINE 20 MG PO TABS: 20.0000 mg | ORAL_TABLET | Freq: Two times a day (BID) | ORAL | 0 refills | Status: AC

## 2024-05-10 MED ORDER — ONDANSETRON 4 MG PO TBDP: 4.0000 mg | ORAL_TABLET | Freq: Three times a day (TID) | ORAL | 0 refills | Status: AC | PRN

## 2024-05-10 MED ORDER — ACETAMINOPHEN 325 MG PO TABS: 650.0000 mg | ORAL_TABLET | Freq: Four times a day (QID) | ORAL | 0 refills | Status: AC | PRN

## 2024-05-10 NOTE — Discharge Instructions (Addendum)
 Make sure you push fluids drinking mostly water but mix it with Gatorade.  Try to eat light meals including soups, broths and soft foods, fruits.  You may use Zofran  for your nausea and vomiting once every 8 hours.  Imodium can help with diarrhea but use this carefully limiting it to 1-2 times per day only if you are having a lot of diarrhea. Continue taking Protonix .  Please return to the clinic if symptoms worsen or you start having severe abdominal pain not helped by taking Tylenol  or start having bloody stools or blood in the vomit.

## 2024-05-10 NOTE — ED Triage Notes (Signed)
 Pt c/o abd pain, HA, acid reflux x 5 days-sx started after eating a bite of sausage and pepperoni pizza at work-last dose tylenol  ~2p-NAD-steady gait

## 2024-05-10 NOTE — ED Provider Notes (Signed)
 [facility]  Note:  This document was prepared using Conservation officer, historic buildings and may include unintentional dictation errors.  MRN: [id number] DOB: [birth year]  Subjective:   Breanna Miranda is a 38 y.o. female presenting for 4 day history of generalized belly pain, nausea, acid reflux, epigastric pain. Has had frontal-temporal headaches as well. Symptoms started after she ate sausage and pepperoni pizza. Has had diarrhea consistently as well. Has used Protonix , Pepto Bismol and Tylenol . No fever, vision changes, confusion, weakness, numbness or tingling, bloody stools recent antibiotic use, hospitalizations or long distance travel.  Has not eaten raw foods, drank unfiltered water.  No history of GI disorders including Crohn's, IBS, ulcerative colitis. Patient is no longer taking Wegovy.   No current facility-administered medications for this encounter.  Current Outpatient Medications:    albuterol  (VENTOLIN  HFA) 108 (90 Base) MCG/ACT inhaler, Inhale 1-2 puffs into the lungs every 6 (six) hours as needed., Disp: 1 each, Rfl: 0   cholecalciferol (VITAMIN D3) 25 MCG (1000 UNIT) tablet, Take 2,000 Units by mouth daily., Disp: , Rfl:    Cyanocobalamin  (VITAMIN B-12 PO), Take by mouth., Disp: , Rfl:    fluticasone  (FLONASE ) 50 MCG/ACT nasal spray, SHAKE LIQUID WELL AND USE 2 SPRAYS IN EACH NOSTRIL DAILY, Disp: 16 g, Rfl: 6   Multiple Vitamins-Minerals (ZINC PO), Take 1 tablet by mouth in the morning., Disp: , Rfl:    ondansetron  (ZOFRAN -ODT) 8 MG disintegrating tablet, Take 8 mg by mouth every 8 (eight) hours as needed., Disp: , Rfl:    pantoprazole  (PROTONIX ) 40 MG tablet, Take 1 tablet (40 mg total) by mouth daily., Disp: 90 tablet, Rfl: 3   WEGOVY 0.5 MG/0.5ML SOAJ SQ injection, Inject 0.5 mg into the skin once a week., Disp: , Rfl:    Allergies  Allergen Reactions   Penicillins Anaphylaxis, Hives and Itching    Has patient had a PCN reaction causing  immediate rash, facial/tongue/throat swelling, SOB or lightheadedness with hypotension: Yes Has patient had a PCN reaction causing severe rash involving mucus membranes or skin necrosis: No Has patient had a PCN reaction that required hospitalization No Has patient had a PCN reaction occurring within the last 10 years: Yes If all of the above answers are NO, then may proceed with Cephalosporin use.    Shrimp [Shellfish Allergy] Anaphylaxis, Hives and Itching    Past Medical History:  Diagnosis Date   Anemia    Anxiety    Asthma    Blood transfusion without reported diagnosis 2016   Chiari I malformation (HCC)    Complication of anesthesia    Patient states he heart beat decreases, shallow breathing   COVID 07/14/2020   Depression    Dyspnea    Empty sella    Family history of adverse reaction to anesthesia    mother heart beat decreases   GERD (gastroesophageal reflux disease)    Irregular heart rhythm    Low grade squamous intraepith lesion on cytologic smear cervix (lgsil) 01/15/2018   Menorrhagia      Past Surgical History:  Procedure Laterality Date   BREAST BIOPSY Left 12/2020   CHOLECYSTECTOMY     TUBAL LIGATION     VAGINAL HYSTERECTOMY Bilateral 09/17/2022   Procedure: VAGINAL HYSTERECTOMY WITH BILATERAL SALPINGECTOMY;  Surgeon: Lorence Ozell CROME, MD;  Location: [facility] OR;  Service: Gynecology;  Laterality: Bilateral;    Family History  Problem Relation Age of Onset   Diabetes Father    Kidney disease Father  failure due to diabetes   Hypertension Mother    Heart attack Maternal Grandmother 38    Social History   Tobacco Use   Smoking status: Former    Current packs/day: 0.00    Average packs/day: 0.1 packs/day for 2.0 years (0.2 ttl pk-yrs)    Types: Cigarettes    Start date: 05/24/2005    Quit date: 05/25/2007    Years since quitting: 16.9    Passive exposure: Past   Smokeless tobacco: Never  Vaping Use   Vaping status: Never Used  Substance Use  Topics   Alcohol use: Yes    Alcohol/week: 1.0 standard drink of alcohol    Types: 1 Glasses of wine per week    Comment: occaional   Drug use: No    ROS   Objective:   Vitals: BP 105/73 (BP Location: Left Arm)   Pulse 70   Temp 98.7 F (37.1 C) (Oral)   Resp 16   LMP 06/26/2022   SpO2 96%   Physical Exam Constitutional:      General: She is not in acute distress.    Appearance: Normal appearance. She is well-developed. She is not ill-appearing, toxic-appearing or diaphoretic.  HENT:     Head: Normocephalic and atraumatic.     Nose: Nose normal.     Mouth/Throat:     Mouth: Mucous membranes are moist.     Pharynx: Oropharynx is clear.  Eyes:     General: No scleral icterus.       Right eye: No discharge.        Left eye: No discharge.     Extraocular Movements: Extraocular movements intact.     Conjunctiva/sclera: Conjunctivae normal.  Neck:     Meningeal: Brudzinski's sign and Kernig's sign absent.  Cardiovascular:     Rate and Rhythm: Normal rate and regular rhythm.     Heart sounds: Normal heart sounds. No murmur heard.    No friction rub. No gallop.  Pulmonary:     Effort: Pulmonary effort is normal. No respiratory distress.     Breath sounds: No stridor. No wheezing, rhonchi or rales.  Chest:     Chest wall: No tenderness.  Abdominal:     General: Bowel sounds are normal. There is no distension.     Palpations: Abdomen is soft. There is no mass.     Tenderness: There is generalized abdominal tenderness (mild). There is no right CVA tenderness, left CVA tenderness, guarding or rebound.  Skin:    General: Skin is warm and dry.  Neurological:     General: No focal deficit present.     Mental Status: She is alert and oriented to person, place, and time.     Cranial Nerves: No cranial nerve deficit, dysarthria or facial asymmetry.     Motor: No weakness or pronator drift.     Coordination: Romberg sign negative. Coordination normal. Finger-Nose-Finger Test  and Heel to [facility] Test normal. Rapid alternating movements normal.     Gait: Gait and tandem walk normal.     Deep Tendon Reflexes: Reflexes normal.  Psychiatric:        Mood and Affect: Mood normal.        Behavior: Behavior normal.        Thought Content: Thought content normal.        Judgment: Judgment normal.     Assessment and Plan :   PDMP not reviewed this encounter.  1. Gastroesophageal reflux disease, unspecified whether esophagitis present  2. Colitis   3. Tension headache    No signs of an acute encephalopathy, acute abdomen.  Patient has hemodynamically stable vital signs.  Discussed this with her and at this stage she is not inclined to present to the emergency room despite reporting that her symptoms are severe.  Recommended conservative management for GERD, concurrent colitis and resulting tension headache.  Counseled patient on potential for adverse effects with medications prescribed/recommended today, ER and return-to-clinic precautions discussed, patient verbalized understanding.    Christopher Savannah, [place] 05/10/24 1843

## 2024-05-18 ENCOUNTER — Ambulatory Visit

## 2024-05-19 ENCOUNTER — Ambulatory Visit: Admitting: Family Medicine

## 2024-05-19 ENCOUNTER — Ambulatory Visit: Payer: Self-pay

## 2024-05-19 VITALS — BP 110/59 | HR 80 | Ht 64.0 in | Wt 200.8 lb

## 2024-05-19 DIAGNOSIS — R1084 Generalized abdominal pain: Secondary | ICD-10-CM | POA: Diagnosis present

## 2024-05-19 DIAGNOSIS — R110 Nausea: Secondary | ICD-10-CM | POA: Diagnosis not present

## 2024-05-19 DIAGNOSIS — K219 Gastro-esophageal reflux disease without esophagitis: Secondary | ICD-10-CM

## 2024-05-19 DIAGNOSIS — R002 Palpitations: Secondary | ICD-10-CM | POA: Diagnosis not present

## 2024-05-19 MED ORDER — PANTOPRAZOLE SODIUM 40 MG PO TBEC
40.0000 mg | DELAYED_RELEASE_TABLET | Freq: Two times a day (BID) | ORAL | 3 refills | Status: AC
Start: 2024-05-19 — End: ?

## 2024-05-19 NOTE — Assessment & Plan Note (Signed)
 Given patient's report of palpitations that have recurred, would like to obtain EKG.  Due to issue with EKG machine and [facility] clinic, will need to have patient return this afternoon to complete EKG.  Of note, patient has follow-up with cardiology on 12/5.

## 2024-05-19 NOTE — Progress Notes (Deleted)
    SUBJECTIVE:   CHIEF COMPLAINT / HPI:   Palpitations   PERTINENT  PMH / PSH: ***  OBJECTIVE:   LMP 06/26/2022  ***  General: NAD, pleasant, able to participate in exam Cardiac: RRR, no murmurs. Respiratory: CTAB, normal effort, No wheezes, rales or rhonchi Abdomen: Bowel sounds present, nontender, nondistended Extremities: no edema or cyanosis. Skin: warm and dry, no rashes noted Neuro: alert, no obvious focal deficits Psych: Normal affect and mood  ASSESSMENT/PLAN:   No problem-specific Assessment & Plan notes found for this encounter.     Dr. Izetta Nap, DO [place] [facility]    {    This will disappear when note is signed, click to select method of visit    :1}

## 2024-05-19 NOTE — Progress Notes (Addendum)
    SUBJECTIVE:   CHIEF COMPLAINT / HPI:   Follow-up abdominal pain, vomiting, headache UC visit 11/17- abd pain, HA, diarrhea. Dx with GERD, prescribed Pepcid  20 BID.  Also takes Protonix  40 mg daily Has used Zofran  but only works for a few hours Ongoing headaches, nausea, poor appetite, diarrhea (nonbloody), L sided pain. Notes heart fluttering as well. Feels worse than at [facility] Has experienced palpitations in the past and recently underwent workup with cardiology, normal echo, Zio in 2024 was normal (do not see results from recent Zio in chart).  Patient states her palpitations had subsided but have returned with these symptoms. Some pain on the R side associated with eating, comes and goes.  S/p cholecystectomy Taking Tylenol  every 4-6 hours daily since sx onset On Wegovy since July, last injection about 1 week ago No fevers No weight change No dysuria No recent travel No sick contacts No recent ABX S/p hysterectomy tubal ligation   PERTINENT  PMH / PSH: Reviewed  OBJECTIVE:   BP (!) 110/59   Pulse 80   Ht 5' 4 (1.626 m)   Wt 200 lb 12.8 oz (91.1 kg)   LMP 06/26/2022   SpO2 97%   BMI 34.47 kg/m   General: Awake and conversant, no acute distress CV: RRR, normal S1/S2, no M/R/G, Pulm: CTAB, normal work of breathing on room air, no W/R/R. Abdominal: Normoactive bowel sounds, soft, nontender to superficial and deep palpation, nondistended Skin: No CVA tenderness bilaterally Neuro: No focal deficits Psych: Appropriate mood and affect   ASSESSMENT/PLAN:   Assessment & Plan Generalized abdominal pain Gastroesophageal reflux disease, unspecified whether esophagitis present Nausea Ongoing symptoms of abdominal pain with some focality on the left or right upper quadrants, associated nausea, diarrhea, headache.  Differential includes pancreatitis, gastroenteritis, choledocholithiasis, medication reaction, worsening of GERD.  Less likely pancreatitis given relatively mild  presentation.  Less likely gastroenteritis given persistence of symptoms for longer than would be expected with this.  Symptoms possibly due to [facility] side effect, advised patient to discontinue this medicine for now and see if symptoms improve.  Also advised patient to increase Protonix  to 40 mg twice daily along with Pepcid  twice daily.  Will collect basic blood work and lipase today.  Advise close follow-up if symptoms are not improved or if they worsen.  Can consider H. pylori testing or GI referral moving forward. - CBC - Comprehensive metabolic panel with GFR - Lipase - pantoprazole  (PROTONIX ) 40 MG tablet; Take 1 tablet (40 mg total) by mouth 2 (two) times daily.  Dispense: 180 tablet; Refill: 3  Palpitations Given patient's report of palpitations that have recurred, would like to obtain EKG.  Due to issue with EKG machine and [facility] clinic, will need to have patient return this afternoon to complete EKG.  Of note, patient has follow-up with cardiology on 12/5.     Rea Raring, MD [facility]

## 2024-05-19 NOTE — Patient Instructions (Addendum)
 Thank you for coming in today! Here is a summary of what we discussed:  -Stop taking the [facility] for now- let's see if that helps.   -You can take the Protonix  and Pepcid  twice daily  -We will contact you if there is anything critically abnormal on your labs  -Please follow up next week if your symptoms persist. We can consider more testing at that time  We are checking some labs today. If they are abnormal, I will call you. If they are normal, I will send you a MyChart message (if it is active) or a letter in the mail. If you do not hear about your labs in the next 2 weeks, please call the office.   Please call the clinic at [phone] if your symptoms worsen or you have any concerns.  Best, Dr Adele

## 2024-05-20 LAB — COMPREHENSIVE METABOLIC PANEL WITH GFR
ALT: 8 IU/L (ref 0–32)
AST: 14 IU/L (ref 0–40)
Albumin: 3.8 g/dL — ABNORMAL LOW (ref 3.9–4.9)
Alkaline Phosphatase: 69 IU/L (ref 41–116)
BUN/Creatinine Ratio: 7 — ABNORMAL LOW (ref 9–23)
BUN: 6 mg/dL (ref 6–24)
Bilirubin Total: 0.6 mg/dL (ref 0.0–1.2)
CO2: 22 mmol/L (ref 20–29)
Calcium: 9.4 mg/dL (ref 8.7–10.2)
Chloride: 102 mmol/L (ref 96–106)
Creatinine, Ser: 0.85 mg/dL (ref 0.57–1.00)
Globulin, Total: 2.6 g/dL (ref 1.5–4.5)
Glucose: 90 mg/dL (ref 70–99)
Potassium: 4 mmol/L (ref 3.5–5.2)
Sodium: 141 mmol/L (ref 134–144)
Total Protein: 6.4 g/dL (ref 6.0–8.5)
eGFR: 89 mL/min/1.73 (ref >59)

## 2024-05-20 LAB — CBC
Hematocrit: 40 % (ref 34.0–46.6)
Hemoglobin: 12.4 g/dL (ref 11.1–15.9)
MCH: 24.7 pg — ABNORMAL LOW (ref 26.6–33.0)
MCHC: 31 g/dL — ABNORMAL LOW (ref 31.5–35.7)
MCV: 80 fL (ref 79–97)
Platelets: 259 x10E3/uL (ref 150–450)
RBC: 5.03 x10E6/uL (ref 3.77–5.28)
RDW: 14 % (ref 11.7–15.4)
WBC: 4 x10E3/uL (ref 3.4–10.8)

## 2024-05-20 LAB — LIPASE: Lipase: 45 U/L (ref 14–72)

## 2024-05-24 ENCOUNTER — Ambulatory Visit: Payer: Self-pay | Admitting: Family Medicine

## 2024-05-24 NOTE — Progress Notes (Deleted)
 Cardiology Clinic Note   Date: 05/24/2024 ID: Eleny, Cortez 10/14/1985, MRN [id number]  Primary Cardiologist:  Candyce Reek, MD  Chief Complaint   Maryjo LITTIE Picking is a 38 y.o. female who presents to the clinic today for ***  Patient Profile   Cornella L Darius is followed by *** for the history outlined below.      Past medical history significant for: Palpitations. 3-day ZIO 04/08/2023: HR 55 to 148 bpm, average 82 bpm.  Predominantly sinus rhythm.  1 short run of SVT lasting 5 beats.  Rare PACs/PVCs.  No sustained arrhythmias. Chest pain/dyspnea. Echo 02/18/2024: EF 55 to 60%.  No RWMA.  Normal diastolic parameters.  Normal global strain.  Normal RV size/function.  Mild MR. GERD. Anemia. Depression. Anxiety. Vasovagal syncope.   In summary, patient was first evaluated by Dr. Reek on 05/31/2022 for palpitations at the request of [name].  Patient reported an episode of fast heart rate of 280 bpm lasting several hours.  She also reported episodes of more brief palpitations.  She has a history of anemia requiring blood transfusions and IV iron  in the setting of dysmenorrhea.  CBC at that time showed hemoglobin 8.6.  Patient wore a ZIO monitor in January 2024 which showed NSR with rare PACs/PVCs and no significant arrhythmias.  Echo showed normal LV/RV function as detailed above.  Patient was evaluated in the emergency department in August 2024 for chest pain and increased palpitations.  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  Troponin was negative x 2.  CBC was stable at [facility] was unremarkable.  She followed up in the office and had a 3-day ZIO placed which showed no sustained arrhythmias as detailed above.   Patient was last seen in the office by [name], NP on 04/18/2023 for follow-up after testing.  She was working on reducing caloric intake and exercising more.  She was advised to avoid  triggers and vagal maneuvers were discussed.  Home sleep study was ordered for STOP-BANG score 4 and complaints of daytime somnolence and snoring.   Patient was evaluated in the ED on 06/19/2023 for palpitations at exertional dyspnea.  Troponin negative x 2.  Potassium 3.  Blood counts stable.  EKG showed sinus rhythm and she was noted to have occasional sinus arrhythmia on the monitor.   Patient was evaluated by PCP on 07/25/2023 for evaluation of syncopal events.  She reported to episodes the evening prior 1 during micturition and 1 when walking back to bed from the bathroom.  Both episodes preceded by typical prodrome of lightheadedness, darkening vision, feeling of warmth.  She reports episodes always happen in the evening and never in the middle of the day.  She had tried increasing sodium intake and hydration but found the increased sodium caused migraines.  In the past episodes were attributed to anemia however since hysterectomy hemoglobin has been stable.    Patient was seen in the clinic on 08/01/2023 for increased palpitations.  Patient reported palpitations described as irregular pounding initially occurring several times a day but increased to continuous sensation with associated shortness of breath and occasional chest tightness.  She had not started midodrine  secondary to concern for BP getting too high.  She reported episodes of syncope occurred at night and she was encouraged to trial midodrine  at night to avoid repeat syncope.  14-day ZIO ordered but completed.   Patient was last seen in the office by me  on 02/19/2024 for follow-up after testing.  She reported palpitations unchanged from previous.  She was unable to take midodrine  as it made her feel jittery.  She was unsure if she received heart monitor in the mail that was ordered in February.  She reported waking in the middle of the night gasping for breath as well as daytime somnolence.  She denied lower extremity edema, orthopnea, DOE.   She was referred to pulmonology for possible sleep study.  14-day ZIO was reordered but does not appear to have been completed.     History of Present Illness    Today, patient ***  Palpitations/PND 3-day ZIO October 2024 showed HR 55 to 148 bpm, average 82 bpm, predominantly sinus rhythm, 1 short run of SVT lasting 5 beats, rare PACs/PVCs.  Echo 02/18/2024 demonstrated normal LV/RV function with normal diastolic parameters and mild MR.  Patient*** -***   Sleep disordered breathing Patient reports episodes of waking up gasping for breath. Upon further questioning she reports snoring at night and day time somnolence. Stop bang score 4.*** - Refer to pulmonology.    Vasovagal syncope Patient recently evaluated by PCP and started on midodrine  in the evenings.  She reported intolerance to midodrine  secondary to feeling jittery on the medication.  Patient*** -Continue to monitor.   ROS: All other systems reviewed and are otherwise negative except as noted in History of Present Illness.  EKGs/Labs Reviewed        05/19/2024: ALT 8; AST 14; BUN 6; Creatinine, Ser 0.85; Potassium 4.0; Sodium 141   05/19/2024: Hemoglobin 12.4; WBC 4.0   01/14/2024: TSH 1.090   No results found for requested labs within last 365 days.  ***  Risk Assessment/Calculations    {Does this patient have ATRIAL FIBRILLATION?[phone]} No BP recorded.  {Refresh Note OR Click here to enter BP  :1}***   STOP-Bang Score:  4  { Consider Dx Sleep Disordered Breathing or Sleep Apnea  ICD G47.33          :1}     Physical Exam    VS:  LMP 06/26/2022  , BMI There is no height or weight on file to calculate BMI.  GEN: Well nourished, well developed, in no acute distress. Neck: No JVD or carotid bruits. Cardiac: *** RRR. *** No murmur. No rubs or gallops.   Respiratory:  Respirations regular and unlabored. Clear to auscultation without rales, wheezing or rhonchi. GI: Soft, nontender, nondistended. Extremities:  Radials/DP/PT 2+ and equal bilaterally. No clubbing or cyanosis. No edema ***  Skin: Warm and dry, no rash. Neuro: Strength intact.  Assessment & Plan   ***  Disposition: ***     {Are you ordering a CV Procedure (e.g. stress test, cath, DCCV, TEE, etc)?   Press F2        :[id number]}   Signed, Barnie HERO. Kailey Esquilin, DNP, NP-C

## 2024-05-25 ENCOUNTER — Other Ambulatory Visit: Payer: Self-pay | Admitting: Family Medicine

## 2024-05-25 DIAGNOSIS — K219 Gastro-esophageal reflux disease without esophagitis: Secondary | ICD-10-CM

## 2024-05-28 ENCOUNTER — Ambulatory Visit: Admitting: Student

## 2024-05-28 ENCOUNTER — Other Ambulatory Visit: Payer: Self-pay

## 2024-05-28 ENCOUNTER — Encounter (HOSPITAL_COMMUNITY): Payer: Self-pay | Admitting: Emergency Medicine

## 2024-05-28 ENCOUNTER — Emergency Department (HOSPITAL_COMMUNITY)

## 2024-05-28 ENCOUNTER — Emergency Department (HOSPITAL_COMMUNITY)
Admission: EM | Admit: 2024-05-28 | Discharge: 2024-05-28 | Disposition: A | Discharge status: Home / Self Care | Attending: Emergency Medicine | Admitting: Emergency Medicine

## 2024-05-28 DIAGNOSIS — R072 Precordial pain: Secondary | ICD-10-CM | POA: Insufficient documentation

## 2024-05-28 DIAGNOSIS — Z87891 Personal history of nicotine dependence: Secondary | ICD-10-CM | POA: Insufficient documentation

## 2024-05-28 DIAGNOSIS — R1010 Upper abdominal pain, unspecified: Secondary | ICD-10-CM | POA: Diagnosis not present

## 2024-05-28 DIAGNOSIS — R748 Abnormal levels of other serum enzymes: Secondary | ICD-10-CM | POA: Insufficient documentation

## 2024-05-28 DIAGNOSIS — R1013 Epigastric pain: Secondary | ICD-10-CM | POA: Insufficient documentation

## 2024-05-28 DIAGNOSIS — R1011 Right upper quadrant pain: Secondary | ICD-10-CM | POA: Insufficient documentation

## 2024-05-28 DIAGNOSIS — R1012 Left upper quadrant pain: Secondary | ICD-10-CM | POA: Insufficient documentation

## 2024-05-28 LAB — CBC
HCT: 40.3 % (ref 36.0–46.0)
Hemoglobin: 12.9 g/dL (ref 12.0–15.0)
MCH: 25 pg — ABNORMAL LOW (ref 26.0–34.0)
MCHC: 32 g/dL (ref 30.0–36.0)
MCV: 78.3 fL — ABNORMAL LOW (ref 80.0–100.0)
Platelets: 250 K/uL (ref 150–400)
RBC: 5.15 MIL/uL — ABNORMAL HIGH (ref 3.87–5.11)
RDW: 15 % (ref 11.5–15.5)
WBC: 8.2 K/uL (ref 4.0–10.5)
nRBC: 0 % (ref 0.0–0.2)

## 2024-05-28 LAB — COMPREHENSIVE METABOLIC PANEL WITH GFR
ALT: 13 U/L (ref 0–44)
AST: 21 U/L (ref 15–41)
Albumin: 4.1 g/dL (ref 3.5–5.0)
Alkaline Phosphatase: 75 U/L (ref 38–126)
Anion gap: 12 (ref 5–15)
BUN: 12 mg/dL (ref 6–20)
CO2: 22 mmol/L (ref 22–32)
Calcium: 9 mg/dL (ref 8.9–10.3)
Chloride: 103 mmol/L (ref 98–111)
Creatinine, Ser: 0.78 mg/dL (ref 0.44–1.00)
GFR, Estimated: >60 mL/min (ref >60)
Glucose, Bld: 121 mg/dL — ABNORMAL HIGH (ref 70–99)
Potassium: 3.8 mmol/L (ref 3.5–5.1)
Sodium: 137 mmol/L (ref 135–145)
Total Bilirubin: 0.4 mg/dL (ref 0.0–1.2)
Total Protein: 7.6 g/dL (ref 6.5–8.1)

## 2024-05-28 LAB — URINALYSIS, ROUTINE W REFLEX MICROSCOPIC
Bilirubin Urine: NEGATIVE
Glucose, UA: NEGATIVE mg/dL
Hgb urine dipstick: NEGATIVE
Ketones, ur: 5 mg/dL — AB
Leukocytes,Ua: NEGATIVE
Nitrite: NEGATIVE
Protein, ur: NEGATIVE mg/dL
Specific Gravity, Urine: 1.025 (ref 1.005–1.030)
pH: 5 (ref 5.0–8.0)

## 2024-05-28 LAB — LIPASE, BLOOD: Lipase: 56 U/L — ABNORMAL HIGH (ref 11–51)

## 2024-05-28 MED ORDER — SUCRALFATE 1 G PO TABS: 1.0000 g | ORAL_TABLET | Freq: Three times a day (TID) | ORAL | 0 refills | Status: AC

## 2024-05-28 MED ORDER — FAMOTIDINE IN NACL 20-0.9 MG/50ML-% IV SOLN
20.0000 mg | Freq: Once | INTRAVENOUS | Status: AC
  Administered 2024-05-28: 20 mg via INTRAVENOUS
  Filled 2024-05-28: qty 50

## 2024-05-28 MED ORDER — PANTOPRAZOLE SODIUM 40 MG IV SOLR
40.0000 mg | Freq: Once | INTRAVENOUS | Status: AC
  Administered 2024-05-28: 40 mg via INTRAVENOUS
  Filled 2024-05-28: qty 10

## 2024-05-28 MED ORDER — SUCRALFATE 1 G PO TABS
1.0000 g | ORAL_TABLET | Freq: Once | ORAL | Status: AC
  Administered 2024-05-28: 1 g via ORAL
  Filled 2024-05-28: qty 1

## 2024-05-28 MED ORDER — ALUM & MAG HYDROXIDE-SIMETH 200-200-20 MG/5ML PO SUSP
30.0000 mL | Freq: Once | ORAL | Status: AC
  Administered 2024-05-28: 30 mL via ORAL
  Filled 2024-05-28: qty 30

## 2024-05-28 MED ORDER — IOHEXOL 300 MG/ML  SOLN
100.0000 mL | Freq: Once | INTRAMUSCULAR | Status: AC | PRN
  Administered 2024-05-28: 100 mL via INTRAVENOUS

## 2024-05-28 NOTE — Discharge Instructions (Addendum)
 Your workup this evening was reassuring.  Your symptoms are likely related to your underlying acid reflux.  Please follow-up with gastroenterology for further evaluation.  Prescription has been sent for Carafate .  This is a tablet that she will take 3-4 times daily.  It is recommended that she take this with meals and at bedtime.  I also recommend continued follow-up with your primary care provider.  If you develop any life-threatening symptoms return to the emergency department.

## 2024-05-28 NOTE — ED Provider Notes (Signed)
 [place] EMERGENCY DEPARTMENT AT [facility] Provider Note   CSN: [id number] Arrival date & time: 05/28/24  2114     Patient presents with: Abdominal Pain   Breanna Miranda is a 38 y.o. female.  Patient with past medical history significant for GERD, anemia presents to the emergency department complaining of 3 weeks of upper abdominal pain.  She states she was seen by both urgent care and primary care but has had worsening pain.  Pain is described mostly as burning..  She states initially she had pain in the substernal/esophageal region but the pain has now spread across the upper abdomen.  She states that she drinks and occasional glass of wine.  She denies nausea, vomiting, chest pain, shortness of breath, urinary symptoms, vaginal discharge.  She has been taking Protonix  for multiple years and recently started on Pepcid .  She believes her symptoms got worse after initiation of Pepcid     Abdominal Pain      Prior to Admission medications   Medication Sig Start Date End Date Taking? Authorizing Provider  sucralfate  (CARAFATE ) 1 g tablet Take 1 tablet (1 g total) by mouth 4 (four) times daily -  with meals and at bedtime. 05/28/24 06/27/24 Yes Logan Ubaldo NOVAK, PA-C  acetaminophen  (TYLENOL ) 325 MG tablet Take 2 tablets (650 mg total) by mouth every 6 (six) hours as needed for moderate pain (pain score 4-6). 05/10/24   Christopher Savannah, PA-C  albuterol  (VENTOLIN  HFA) 108 (90 Base) MCG/ACT inhaler Inhale 1-2 puffs into the lungs every 6 (six) hours as needed. 06/26/23   Mayer, Jodi R, NP  cholecalciferol (VITAMIN D3) 25 MCG (1000 UNIT) tablet Take 2,000 Units by mouth daily.    [provider]  Cyanocobalamin  (VITAMIN B-12 PO) Take by mouth.    [provider]  famotidine  (PEPCID ) 20 MG tablet Take 1 tablet (20 mg total) by mouth 2 (two) times daily. 05/10/24   Christopher Savannah, PA-C  fluticasone  (FLONASE ) 50 MCG/ACT nasal spray SHAKE LIQUID WELL AND USE 2 SPRAYS IN  EACH NOSTRIL DAILY 04/14/24   Baloch, Mahnoor, MD  loperamide  (IMODIUM ) 2 MG capsule Take 1 capsule (2 mg total) by mouth 2 (two) times daily as needed for diarrhea or loose stools. 05/10/24   Christopher Savannah, PA-C  Multiple Vitamins-Minerals (ZINC PO) Take 1 tablet by mouth in the morning.    [provider]  ondansetron  (ZOFRAN -ODT) 4 MG disintegrating tablet Take 1 tablet (4 mg total) by mouth every 8 (eight) hours as needed for nausea or vomiting. 05/10/24   Christopher Savannah, PA-C  pantoprazole  (PROTONIX ) 40 MG tablet Take 1 tablet (40 mg total) by mouth 2 (two) times daily. 05/19/24   Adele Song, MD  montelukast  (SINGULAIR ) 10 MG tablet Take 1 tablet (10 mg total) by mouth at bedtime. 05/12/18 08/02/19  Anders Otto DASEN, MD    Allergies: Penicillins and Shrimp [shellfish allergy]    Review of Systems  Gastrointestinal:  Positive for abdominal pain.    Updated Vital Signs BP 124/69   Pulse 82   Temp 98.1 F (36.7 C)   Resp 16   LMP 06/26/2022   SpO2 98%   Physical Exam Vitals and nursing note reviewed.  Constitutional:      General: She is not in acute distress.    Appearance: She is well-developed.  HENT:     Head: Normocephalic and atraumatic.  Eyes:     Conjunctiva/sclera: Conjunctivae normal.  Cardiovascular:     Rate and Rhythm: Normal  rate and regular rhythm.  Pulmonary:     Effort: Pulmonary effort is normal.     Breath sounds: Normal breath sounds.  Abdominal:     Palpations: Abdomen is soft.     Tenderness: There is abdominal tenderness in the right upper quadrant, epigastric area and left upper quadrant.  Musculoskeletal:        General: No swelling.     Cervical back: Neck supple.  Skin:    General: Skin is warm and dry.     Capillary Refill: Capillary refill takes less than 2 seconds.  Neurological:     Mental Status: She is alert.  Psychiatric:        Mood and Affect: Mood normal.     (all labs ordered are listed, but only abnormal results are  displayed) Labs Reviewed  LIPASE, BLOOD - Abnormal; Notable for the following components:      Result Value   Lipase 56 (*)    All other components within normal limits  COMPREHENSIVE METABOLIC PANEL WITH GFR - Abnormal; Notable for the following components:   Glucose, Bld 121 (*)    All other components within normal limits  CBC - Abnormal; Notable for the following components:   RBC 5.15 (*)    MCV 78.3 (*)    MCH 25.0 (*)    All other components within normal limits  URINALYSIS, ROUTINE W REFLEX MICROSCOPIC - Abnormal; Notable for the following components:   Ketones, ur 5 (*)    All other components within normal limits    EKG: None  Radiology: CT ABDOMEN PELVIS W CONTRAST Result Date: 05/28/2024 EXAM: CT ABDOMEN AND PELVIS WITH CONTRAST 05/28/2024 10:49:41 PM TECHNIQUE: CT of the abdomen and pelvis was performed with the administration of 100 mL of iohexol  (OMNIPAQUE ) 300 MG/ML solution. Multiplanar reformatted images are provided for review. Automated exposure control, iterative reconstruction, and/or weight-based adjustment of the mA/kV was utilized to reduce the radiation dose to as low as reasonably achievable. COMPARISON: None available. CLINICAL HISTORY: Upper abdominal pain. FINDINGS: LOWER CHEST: No acute abnormality. LIVER: The liver is unremarkable. GALLBLADDER AND BILE DUCTS: Prior cholecystectomy. Mild intrahepatic and extrahepatic biliary ductal dilatation likely related to post-cholecystectomy state. SPLEEN: No acute abnormality. PANCREAS: No acute abnormality. ADRENAL GLANDS: No acute abnormality. KIDNEYS, URETERS AND BLADDER: Contrast material is seen throughout the collecting systems and ureters bilaterally, which are normal caliber. This precludes evaluation for stones. No hydronephrosis. No perinephric or periureteral stranding. Urinary bladder is unremarkable. GI AND BOWEL: Stomach demonstrates no acute abnormality. There is no bowel obstruction. Normal appendix.  PERITONEUM AND RETROPERITONEUM: No ascites. No free air. VASCULATURE: Aorta is normal in caliber. LYMPH NODES: No lymphadenopathy. REPRODUCTIVE ORGANS: No acute abnormality. BONES AND SOFT TISSUES: No acute osseous abnormality. No focal soft tissue abnormality. IMPRESSION: 1. No acute findings. Electronically signed by: Franky Crease MD 05/28/2024 10:53 PM EST RP Workstation: [id number]     Procedures   Medications Ordered in the ED  famotidine  (PEPCID ) IVPB 20 mg premix (20 mg Intravenous New Bag/Given 05/28/24 2258)  pantoprazole  (PROTONIX ) injection 40 mg (40 mg Intravenous Given 05/28/24 2258)  alum & mag hydroxide-simeth (MAALOX/MYLANTA) 200-200-20 MG/5ML suspension 30 mL (30 mLs Oral Given 05/28/24 2256)  sucralfate  (CARAFATE ) tablet 1 g (1 g Oral Given 05/28/24 2256)  iohexol  (OMNIPAQUE ) 300 MG/ML solution 100 mL (100 mLs Intravenous Contrast Given 05/28/24 2238)  Medical Decision Making Amount and/or Complexity of Data Reviewed Labs: ordered. Radiology: ordered.  Risk OTC drugs. Prescription drug management.   This patient presents to the ED for concern of abdominal pain, this involves an extensive number of treatment options, and is a complaint that carries with it a high risk of complications and morbidity.  The differential diagnosis includes gastritis, GERD, peptic ulcer disease, cholecystitis, pancreatitis, others   Co morbidities / Chronic conditions that complicate the patient evaluation  History of GERD, anemia   Additional history obtained:  Additional history obtained from EMR External records from outside source obtained and reviewed including urgent care and primary care notes   Lab Tests:  I Ordered, and personally interpreted labs.  The pertinent results include: Minimally elevated lipase at 56   Imaging Studies ordered:  I ordered imaging studies including CT abdomen pelvis with contrast I independently visualized and  interpreted imaging which showed no acute findings I agree with the radiologist interpretation   Problem List / ED Course / Critical interventions / Medication management   I ordered medication including Pepcid , Protonix , Maalox, Carafate  Reevaluation of the patient after these medicines showed that the patient improved I have reviewed the patients home medicines and have made adjustments as needed   Social Determinants of Health:  Patient is a former smoker, has Medicaid for her primary health insurance type   Test / Admission - Considered:  No acute findings on CT scan.  Suspect GERD versus peptic ulcer disease.  Patient has been referred to gastroenterology by her primary care provider.  Plan to have patient follow-up with PCP/GI.  Carafate  prescription provided.  No indication for further emergent workup or admission.  Return precautions provided.      Final diagnoses:  Epigastric pain    ED Discharge Orders          Ordered    sucralfate  (CARAFATE ) 1 g tablet  3 times daily with meals & bedtime        05/28/24 2321               Logan Ubaldo KATHEE DEVONNA 05/28/24 2322    Trine Raynell Moder, MD 05/29/24 [phone]

## 2024-05-28 NOTE — ED Triage Notes (Signed)
 Pt reports abd pain x 3 weeks. Also reports acid reflux. Reports she was seen at [facility] and PCP w/ no relief. Denies n/v/d.

## 2024-06-07 ENCOUNTER — Encounter: Payer: Self-pay | Admitting: Family Medicine

## 2024-06-07 ENCOUNTER — Ambulatory Visit: Payer: Self-pay | Admitting: Family Medicine

## 2024-06-07 VITALS — BP 106/61 | HR 78 | Ht 64.0 in | Wt 205.6 lb

## 2024-06-07 DIAGNOSIS — N644 Mastodynia: Secondary | ICD-10-CM

## 2024-06-07 MED ORDER — IBUPROFEN 600 MG PO TABS: 600.0000 mg | ORAL_TABLET | Freq: Three times a day (TID) | ORAL | 0 refills | Status: AC

## 2024-06-07 NOTE — Assessment & Plan Note (Signed)
 Suspect pain is 2/2 to MSK. Located mainly along sternal border and axilla and TTP. No pain when palpated breast tissue. Tissue does have fibrous changes but no overt mass. Will order diagnostic mammogram to rule out any breast pathology.  - Ibuprofen  600mg  q8h for 2 weeks - diagnostic mammogram

## 2024-06-07 NOTE — Patient Instructions (Addendum)
 It was wonderful to see you today.  Please bring ALL of your medications with you to every visit.   Today we talked about:  I believe your pain is musculoskeletal in nature. Please take the ibuprofen  as prescribed for 2 weeks. I will call you with the mammogram results.   Thank you for choosing [facility].   Please call [phone] with any questions about today's appointment.  Please arrive at least 15 minutes prior to your scheduled appointments.   If you had blood work today, I will send you a MyChart message or a letter if results are normal. Otherwise, I will give you a call.   If you had a referral placed, they will call you to set up an appointment. Please give us  a call if you don't hear back in the next 2 weeks.   If you need additional refills before your next appointment, please call your pharmacy first.   Do you need your medications delivered to your home?   Well send your prescription to the [facility] for delivery.          Address: [address] [place], [place], [place] [address]          Phone: [phone]  Please call the [facility] to speak with a pharmacist and set up your home medication delivery. If you have any questions, feel free to contact us  -- were happy to help!  Other [place] [facility]acies that offer affordable prices on both prescriptions and over-the-counter items, as well as convenient services like vaccinations, are  [facility], at [facility]         Address:  [address], [place], [place] [address]         Phone: [phone]  [facility], located in the [facility]        Address: [address], [place], [place] [address]        Phone: [phone]  [facility], at [facility]       Address: [address], [place], [place] [address]       Phone: [phone]  [facility],  at [facility]       Address: [address], [place], [place] [address]       Phone: [phone]  You should follow up in our clinic in as needed   Gloriann Ogren, MD Family Medicine

## 2024-06-07 NOTE — Progress Notes (Signed)
° ° °  SUBJECTIVE:   CHIEF COMPLAINT / HPI:   Presenting for left side breast pain. Reports sharp pain at the center of chest and then also on the left axilla where her breast tissue begins. Has had this happen in the past and had biopsy done which showed fibrocystic changes with usual ductal hyperplasia, apocrine metaplasia and calcifications. The pain comes and goes. Has not tried anything for it yet. Denies any nipple discharge. Denies any pain on R side. No chest pressure. No SOB. Had hysterectomy in the past.  OBJECTIVE:   BP 106/61   Pulse 78   Ht 5' 4 (1.626 m)   Wt 205 lb 9.6 oz (93.3 kg)   LMP 06/26/2022   SpO2 99%   BMI 35.29 kg/m   General: A&O, NAD HEENT: No sign of trauma, EOM grossly intact Respiratory: NWOB on RA  Neuro: Normal gait, moves all four extremities appropriately. Psych: Appropriate mood and affect Breasts: Left breast with symmetric fibrous changes in both upper outer quadrants, right breast normal without mass, skin or nipple changes or axillary nodes. TTP along chest wall and axilla    ASSESSMENT/PLAN:   Assessment & Plan Breast pain, left Suspect pain is 2/2 to MSK. Located mainly along sternal border and axilla and TTP. No pain when palpated breast tissue. Tissue does have fibrous changes but no overt mass. Will order diagnostic mammogram to rule out any breast pathology.  - Ibuprofen  600mg  q8h for 2 weeks - diagnostic mammogram     Gloriann Ogren, MD [facility]

## 2024-06-08 ENCOUNTER — Other Ambulatory Visit: Payer: Self-pay | Admitting: Family Medicine

## 2024-06-08 DIAGNOSIS — N644 Mastodynia: Secondary | ICD-10-CM

## 2024-06-15 NOTE — Progress Notes (Signed)
 "  Cardiology Clinic Note   Date: 06/22/2024 ID: Breanna Miranda [birth year], MRN [id number]  Primary Cardiologist:  Redell Cave, MD  Chief Complaint   Breanna Miranda is a 38 y.o. female who presents to the clinic today for follow up after testing.   Patient Profile   Breanna Miranda was previously followed  by Dr. Dann in [place]. She will be transitioning care to Dr. Cave.     Past medical history significant for: Palpitations. 3-day ZIO 04/08/2023: HR 55 to 148 bpm, average 82 bpm.  Predominantly sinus rhythm.  1 short run of SVT lasting 5 beats.  Rare PACs/PVCs.  No sustained arrhythmias. 3 day Zio 06/07/2024: Preliminary report - HR 58-162 bpm, average 85 bpm. Predominately sinus rhythm. 2 runs of SVT fastest/longest 5 beats max rate 165 bpm, avg 110 bpm.  Chest pain/dyspnea. Echo 02/18/2024: EF 55 to 60%.  No RWMA.  Normal diastolic parameters.  Normal global strain.  Normal RV size/function.  Mild MR. GERD. Anemia. Depression. Anxiety. Vasovagal syncope.   In summary, patient was first evaluated by Dr. Dann on 05/31/2022 for palpitations at the request of Breanna Miranda.  Patient reported an episode of fast heart rate of 280 bpm lasting several hours.  She also reported episodes of more brief palpitations.  She has a history of anemia requiring blood transfusions and IV iron  in the setting of dysmenorrhea.  CBC at that time showed hemoglobin 8.6.  Patient wore a ZIO monitor in January 2024 which showed NSR with rare PACs/PVCs and no significant arrhythmias.  Echo showed normal LV/RV function as detailed above.  Patient was evaluated in the emergency department in August 2024 for chest pain and increased palpitations.  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  Troponin was negative x 2.  CBC was stable at Breanna Miranda was unremarkable.  She followed up in the office and had a 3-day ZIO placed  which showed no sustained arrhythmias as detailed above.   Patient was last seen in the office by Breanna Beauvais, NP on 04/18/2023 for follow-up after testing.  She was working on reducing caloric intake and exercising more.  She was advised to avoid triggers and vagal maneuvers were discussed.  Home sleep study was ordered for STOP-BANG score 4 and complaints of daytime somnolence and snoring.   Patient was evaluated in the ED on 06/19/2023 for palpitations at exertional dyspnea.  Troponin negative x 2.  Potassium 3.  Blood counts stable.  EKG showed sinus rhythm and she was noted to have occasional sinus arrhythmia on the monitor.   Patient was evaluated by PCP on 07/25/2023 for evaluation of syncopal events.  She reported to episodes the evening prior 1 during micturition and 1 when walking back to bed from the bathroom.  Both episodes preceded by typical prodrome of lightheadedness, darkening vision, feeling of warmth.  She reports episodes always happen in the evening and never in the middle of the day.  She had tried increasing sodium intake and hydration but found the increased sodium caused migraines.  In the past episodes were attributed to anemia however since hysterectomy hemoglobin has been stable.    Patient was seen in the clinic on 08/01/2023 for increased palpitations.  Patient reported palpitations described as irregular pounding initially occurring several times a day but increased to continuous sensation with associated shortness of breath and occasional chest tightness.  She had not started midodrine  secondary to concern for  BP getting too high.  She reported episodes of syncope occurred at night and she was encouraged to trial midodrine  at night to avoid repeat syncope.  14-day ZIO ordered but completed.   Patient was last seen in the office by me on 02/19/2024 for follow-up after testing.  She reported palpitations unchanged from previous.  She was unable to take midodrine  as it made her  feel jittery.  She was unsure if she received heart monitor in the mail that was ordered in February.  She reported waking in the middle of the night gasping for breath as well as daytime somnolence.  She denied lower extremity edema, orthopnea, DOE.  She was referred to pulmonology for possible sleep study.  14-day ZIO was reordered.     History of Present Illness    Today, patient continues to have daily palpitations described has heart racing. Palpitations are unchanged from previous and are accompanied by dyspnea and chest tightness. She has a history of hypotension and could not tolerate midodrine . She checks her BP regularly at home and it is typically in the 100s/60s. She will rarely get readings as high as 160 systolic but that is typically when she has salt. She states she is very sensitive to sodium and will experience increased BP with just a small amount. Discussed results of heart monitor in detail. All questions answered.     ROS: All other systems reviewed and are otherwise negative except as noted in History of Present Illness.  EKGs/Labs Reviewed    EKG Interpretation Date/Time:  Tuesday June 22 2024 15:59:11 EST Ventricular Rate:  70 PR Interval:  190 QRS Duration:  70 QT Interval:  378 QTC Calculation: 408 R Axis:   25  Text Interpretation: Normal sinus rhythm Normal ECG When compared with ECG of 19-Feb-2024 15:48, No significant change was found Confirmed by Loistine Sober [phone]) on 06/22/2024 4:00:09 PM   05/28/2024: ALT 13; AST 21; BUN 12; Creatinine, Ser 0.78; Potassium 3.8; Sodium 137   05/28/2024: Hemoglobin 12.9; WBC 8.2   01/14/2024: TSH 1.090   Risk Assessment/Calculations          STOP-Bang Score:  4       Physical Exam    VS:  BP 108/70 (BP Location: Left Arm, Patient Position: Sitting, Cuff Size: Large)   Pulse 70   Ht 5' 4 (1.626 m)   Wt 203 lb (92.1 kg)   LMP 06/26/2022   SpO2 99%   BMI 34.84 kg/m  , BMI Body mass index is 34.84  kg/m.  GEN: Well nourished, well developed, in no acute distress. Neck: No JVD or carotid bruits. Cardiac:  RRR.  No murmur. No rubs or gallops.   Respiratory:  Respirations regular and unlabored. Clear to auscultation without rales, wheezing or rhonchi. GI: Soft, nontender, nondistended. Extremities: Radials/DP/PT 2+ and equal bilaterally. No clubbing or cyanosis. No edema   Skin: Warm and dry, no rash. Neuro: Strength intact.  Assessment & Plan   Palpitations/PSVT 3-day ZIO December 2025 showed HR 58 to 162 bpm, average 85 bpm, predominantly sinus rhythm, 2 runs of SVT lasting 5 beats, rare PACs/PVCs.  Echo 02/18/2024 demonstrated normal LV/RV function with normal diastolic parameters and mild MR.  Patient reports palpitations unchanged from previous. She has a history of hypotension making treating palpitations challenging. Home BP typically in the 100s/60s. Rarely she gets readings as high as 160s systolic associated with eating something salty. She states she is very sensitive to sodium. In the past she has  taken a pinch of salt to help raise her BP when it is very low. Discussed starting propranolol as needed. She will check BP first and not take it if <100/60. She plans to take a pinch of salt when she takes the medication to try to balance the BP lowering effects.  - Propranolol 10 mg bid prn palpitations. Hold for BP <100/60.    Sleep disordered breathing Patient reports episodes of waking up gasping for breath. Upon further questioning she reports snoring at night and day time somnolence. Stop bang score 4. - She was evaluated by sleep medicine in September.    Vasovagal syncope Patient recently evaluated by PCP and started on midodrine  in the evenings.  She reported intolerance to midodrine  secondary to feeling jittery on the medication.  No report of further syncope.  -Continue to monitor.   Disposition: Propranolol 10 mg bid prn palpitations. Hold for BP <100/60. Return in 3  months or sooner as needed.          Signed, Barnie HERO. Coleton Woon, DNP, NP-C  "

## 2024-06-22 ENCOUNTER — Encounter: Payer: Self-pay | Admitting: Student

## 2024-06-22 ENCOUNTER — Ambulatory Visit: Attending: Student | Admitting: Student

## 2024-06-22 VITALS — BP 108/70 | HR 70 | Ht 64.0 in | Wt 203.0 lb

## 2024-06-22 DIAGNOSIS — R002 Palpitations: Secondary | ICD-10-CM | POA: Diagnosis not present

## 2024-06-22 DIAGNOSIS — G4730 Sleep apnea, unspecified: Secondary | ICD-10-CM | POA: Diagnosis not present

## 2024-06-22 DIAGNOSIS — R55 Syncope and collapse: Secondary | ICD-10-CM | POA: Diagnosis not present

## 2024-06-22 DIAGNOSIS — I471 Supraventricular tachycardia, unspecified: Secondary | ICD-10-CM | POA: Diagnosis not present

## 2024-06-22 MED ORDER — PROPRANOLOL HCL 10 MG PO TABS: 10.0000 mg | ORAL_TABLET | Freq: Two times a day (BID) | ORAL | 3 refills | Status: AC | PRN

## 2024-06-22 NOTE — Patient Instructions (Signed)
 Medication Instructions:   Your physician recommends the following medication changes.  START TAKING: Propanolol (Inderal) 10 mg two times daily as needed for Palpitations  *If you need a refill on your cardiac medications before your next appointment, please call your pharmacy*  Lab Work:  None ordered at this time   If you have labs (blood work) drawn today and your tests are completely normal, you will receive your results only by:  MyChart Message (if you have MyChart) OR  A paper copy in the mail If you have any lab test that is abnormal or we need to change your treatment, we will call you to review the results.  Testing/Procedures:  None ordered at this time   Referrals:  None ordered at this time   Follow-Up:  At [facility], you and your health needs are our priority.  As part of our continuing mission to provide you with exceptional heart care, our providers are all part of one team.  This team includes your primary Cardiologist (physician) and Advanced Practice Providers or APPs (Physician Assistants and Nurse Practitioners) who all work together to provide you with the care you need, when you need it.  Your next appointment:   3 month(s)  Provider:    Redell Cave, MD or Barnie Hila, NP    We recommend signing up for the patient portal called MyChart.  Sign up information is provided on this After Visit Summary.  MyChart is used to connect with patients for Virtual Visits (Telemedicine).  Patients are able to view lab/test results, encounter notes, upcoming appointments, etc.  Non-urgent messages can be sent to your provider as well.   To learn more about what you can do with MyChart, go to [email or web address].

## 2024-06-30 ENCOUNTER — Other Ambulatory Visit

## 2024-06-30 ENCOUNTER — Encounter

## 2024-07-05 DIAGNOSIS — R002 Palpitations: Secondary | ICD-10-CM

## 2024-07-06 ENCOUNTER — Ambulatory Visit
Admission: RE | Admit: 2024-07-06 | Discharge: 2024-07-06 | Disposition: A | Source: Ambulatory Visit | Attending: Family Medicine

## 2024-07-06 ENCOUNTER — Ambulatory Visit: Payer: Self-pay | Admitting: Student

## 2024-07-06 DIAGNOSIS — N644 Mastodynia: Secondary | ICD-10-CM

## 2024-07-07 ENCOUNTER — Ambulatory Visit
Admission: EM | Admit: 2024-07-07 | Discharge: 2024-07-07 | Disposition: A | Discharge status: Home / Self Care | Attending: Family Medicine | Admitting: Family Medicine

## 2024-07-07 ENCOUNTER — Encounter: Payer: Self-pay | Admitting: Emergency Medicine

## 2024-07-07 DIAGNOSIS — M25562 Pain in left knee: Secondary | ICD-10-CM | POA: Diagnosis not present

## 2024-07-07 MED ORDER — NAPROXEN 500 MG PO TABS: 500.0000 mg | ORAL_TABLET | Freq: Two times a day (BID) | ORAL | 0 refills | Status: AC | PRN

## 2024-07-07 NOTE — ED Triage Notes (Signed)
 Patient c/o left knee pain for several weeks.  Denies any injury.  No swelling and no redness.  Sore to touch.  Patient has taken Tylenol  for pain.

## 2024-07-07 NOTE — Discharge Instructions (Addendum)
 Start naproxen  twice daily for 7 days.  Take this with food.  Use a knee sleeve to help support the joint.  Elevate and ice as needed.  Follow-up with your PCP in 1 week if symptoms do not improve.  Please go to the ER for any worsening symptoms.  Hope you feel better soon!

## 2024-07-07 NOTE — ED Provider Notes (Signed)
 " [facility]    CSN: [id number] Arrival date & time: 07/07/24  1606      History   Chief Complaint Chief Complaint  Patient presents with   Knee Pain    HPI Breanna Miranda is a 39 y.o. female presents for knee pain.  Patient reports 2 weeks of a medial left knee pain that is persistent.  Denies any known injury or inciting event.  Denies any swelling, numbness tingling or bruising.  Pain does not worsen with weightbearing or activity.  No history of injuries or surgeries to the past.  Breanna Miranda has not used any OTC treatments for symptoms.  No other concerns.    Knee Pain   Past Medical History:  Diagnosis Date   Anemia    Anxiety    Asthma    Blood transfusion without reported diagnosis 2016   Chiari I malformation (HCC)    Complication of anesthesia    Patient states he heart beat decreases, shallow breathing   COVID 07/14/2020   Depression    Dyspnea    Empty sella    Family history of adverse reaction to anesthesia    mother heart beat decreases   GERD (gastroesophageal reflux disease)    Irregular heart rhythm    Low grade squamous intraepith lesion on cytologic smear cervix (lgsil) 01/15/2018   Menorrhagia     Patient Active Problem List   Diagnosis Date Noted   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   Urticaria 11/07/2022   It band syndrome, left 10/25/2022   Breast pain, left 10/25/2022   History of bilateral salpingectomy 10/17/2022   History of vaginal hysterectomy 10/17/2022   Frequent headaches 08/27/2022   Chronic throat clearing 03/23/2022   Palpitations 01/24/2022   1st degree AV block 01/24/2022   Thoracic back pain 10/09/2020   Breast pain 08/04/2020   External hemorrhoid 06/02/2020   Peripheral neuropathy 06/02/2020   Ganglion cyst 06/02/2020   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/25/2020   Gastroesophageal reflux disease without esophagitis 04/25/2020   Insomnia 10/15/2017   Pica in  adults    Anxiety 08/10/2014   Anemia 05/06/2012    Past Surgical History:  Procedure Laterality Date   BREAST BIOPSY Left 12/2020   CHOLECYSTECTOMY     TUBAL LIGATION     VAGINAL HYSTERECTOMY Bilateral 09/17/2022   Procedure: VAGINAL HYSTERECTOMY WITH BILATERAL SALPINGECTOMY;  Surgeon: Lorence Ozell CROME, MD;  Location: [facility] OR;  Service: Gynecology;  Laterality: Bilateral;    OB History     Gravida  5   Para  4   Term  2   Preterm  2   AB  1   Living  4      SAB  1   IAB  0   Ectopic  0   Multiple  0   Live Births  4            Home Medications    Prior to Admission medications  Medication Sig Start Date End Date Taking? Authorizing Provider  acetaminophen  (TYLENOL ) 325 MG tablet Take 2 tablets (650 mg total) by mouth every 6 (six) hours as needed for moderate pain (pain score 4-6). 05/10/24  Yes Christopher Savannah, PA-C  cholecalciferol (VITAMIN D3) 25 MCG (1000 UNIT) tablet Take 2,000 Units by mouth daily.   Yes [provider]  Cyanocobalamin  (VITAMIN B-12 PO) Take by mouth.   Yes [provider]  fluticasone  (FLONASE ) 50 MCG/ACT nasal spray SHAKE LIQUID  WELL AND USE 2 SPRAYS IN EACH NOSTRIL DAILY 04/14/24  Yes Baloch, Mahnoor, MD  ibuprofen  (ADVIL ) 600 MG tablet Take 1 tablet (600 mg total) by mouth every 8 (eight) hours. 06/07/24  Yes Baloch, Mahnoor, MD  naproxen  (NAPROSYN ) 500 MG tablet Take 1 tablet (500 mg total) by mouth 2 (two) times daily as needed (knee pain). 07/07/24  Yes Betheny Suchecki, Jodi R, NP  albuterol  (VENTOLIN  HFA) 108 (90 Base) MCG/ACT inhaler Inhale 1-2 puffs into the lungs every 6 (six) hours as needed. 06/26/23   Tammara Massing, Jodi R, NP  famotidine  (PEPCID ) 20 MG tablet Take 1 tablet (20 mg total) by mouth 2 (two) times daily. 05/10/24   Christopher Savannah, PA-C  loperamide  (IMODIUM ) 2 MG capsule Take 1 capsule (2 mg total) by mouth 2 (two) times daily as needed for diarrhea or loose stools. 05/10/24   Christopher Savannah, PA-C  Multiple Vitamins-Minerals  (ZINC PO) Take 1 tablet by mouth in the morning.    [provider]  ondansetron  (ZOFRAN -ODT) 4 MG disintegrating tablet Take 1 tablet (4 mg total) by mouth every 8 (eight) hours as needed for nausea or vomiting. 05/10/24   Christopher Savannah, PA-C  pantoprazole  (PROTONIX ) 40 MG tablet Take 1 tablet (40 mg total) by mouth 2 (two) times daily. Patient not taking: Reported on 06/22/2024 05/19/24   Adele Song, MD  propranolol  (INDERAL ) 10 MG tablet Take 1 tablet (10 mg total) by mouth 2 (two) times daily as needed (For Palpitations). 06/22/24   Loistine Sober, NP  sucralfate  (CARAFATE ) 1 g tablet Take 1 tablet (1 g total) by mouth 4 (four) times daily -  with meals and at bedtime. 05/28/24 06/27/24  Logan Ubaldo NOVAK, PA-C  montelukast  (SINGULAIR ) 10 MG tablet Take 1 tablet (10 mg total) by mouth at bedtime. Patient not taking: Reported on 06/22/2024 05/12/18 08/02/19  Anders Otto DASEN, MD    Family History Family History  Problem Relation Age of Onset   Diabetes Father    Kidney disease Father        failure due to diabetes   Hypertension Mother    Heart attack Maternal Grandmother 71    Social History Social History[1]   Allergies   Penicillins and Shrimp [shellfish allergy]   Review of Systems Review of Systems  Musculoskeletal:        Left knee pain     Physical Exam Triage Vital Signs ED Triage Vitals  Encounter Vitals Group     BP 07/07/24 1638 100/67     Girls Systolic BP Percentile --      Girls Diastolic BP Percentile --      Boys Systolic BP Percentile --      Boys Diastolic BP Percentile --      Pulse Rate 07/07/24 1638 69     Resp --      Temp 07/07/24 1638 99 F (37.2 C)     Temp Source 07/07/24 1638 Oral     SpO2 07/07/24 1638 97 %     Weight 07/07/24 1637 197 lb (89.4 kg)     Height 07/07/24 1637 5' 4 (1.626 m)     Head Circumference --      Peak Flow --      Pain Score 07/07/24 1637 8     Pain Loc --      Pain Education --      Exclude from  Growth Chart --    No data found.  Updated Vital Signs BP 100/67 (BP Location:  Right Arm)   Pulse 69   Temp 99 F (37.2 C) (Oral)   Ht 5' 4 (1.626 m)   Wt 197 lb (89.4 kg)   LMP 06/26/2022   SpO2 97%   BMI 33.81 kg/m   Visual Acuity Right Eye Distance:   Left Eye Distance:   Bilateral Distance:    Right Eye Near:   Left Eye Near:    Bilateral Near:     Physical Exam Vitals and nursing note reviewed.  Constitutional:      General: Breanna Miranda is not in acute distress.    Appearance: Normal appearance. Breanna Miranda is not ill-appearing.  HENT:     Head: Normocephalic and atraumatic.  Eyes:     Pupils: Pupils are equal, round, and reactive to light.  Cardiovascular:     Rate and Rhythm: Normal rate.  Pulmonary:     Effort: Pulmonary effort is normal.  Musculoskeletal:     Left knee: No swelling, deformity, effusion, erythema, ecchymosis, lacerations, bony tenderness or crepitus. Normal range of motion. Tenderness present over the medial joint line. No lateral joint line or patellar tendon tenderness. Normal alignment and normal patellar mobility. Normal pulse.     Comments: Negative valgus and varus stress test  Skin:    General: Skin is warm and dry.  Neurological:     General: No focal deficit present.     Mental Status: Breanna Miranda is alert and oriented to person, place, and time.  Psychiatric:        Mood and Affect: Mood normal.        Behavior: Behavior normal.      UC Treatments / Results  Labs (all labs ordered are listed, but only abnormal results are displayed) Labs Reviewed - No data to display  EKG   Radiology MM 3D DIAGNOSTIC MAMMOGRAM BILATERAL BREAST Result Date: 07/06/2024 CLINICAL DATA:  LEFT inner breast pain EXAM: DIGITAL DIAGNOSTIC BILATERAL MAMMOGRAM WITH TOMOSYNTHESIS AND CAD; ULTRASOUND LEFT BREAST LIMITED TECHNIQUE: Bilateral digital diagnostic mammography and breast tomosynthesis was performed. The images were evaluated with computer-aided detection. ;  Targeted ultrasound examination of the left breast was performed. COMPARISON:  Previous exam(s). ACR Breast Density Category b: There are scattered areas of fibroglandular density. FINDINGS: Diagnostic tomosynthesis views were obtained over the area of pain in the LEFT breast. No suspicious mammographic finding is identified in this area. No suspicious mass, microcalcification, or other finding is identified in either breast. On physical exam, no suspicious mass is appreciated. Targeted LEFT breast ultrasound was performed in the area of pain at the inner breast. No suspicious solid or cystic mass is identified. IMPRESSION: 1. No mammographic or sonographic evidence of malignancy at the site of painful concern in the LEFT breast. Any further workup of the patient's symptoms should be based on the clinical assessment. Recommend routine annual screening mammogram in 1 year. 2. No mammographic evidence of malignancy bilaterally. 3. Breast pain is a common condition, which will often resolve on its own without intervention. It can be affected by hormonal changes, medication side effect, weight changes and fit of the bra. Pain may also be referred from other adjacent areas of the body. Breast pain may be improved by wearing adequate well-fitting support, over-the-counter topical and oral NSAID medication, low-fat diet, and ice/heat as needed. Studies have shown an improvement in cyclic pain with use of evening primrose oil, vitamin D  and vitamin E. Clinical follow-up recommended to discuss any further work-up recommendations and appropriate treatment. RECOMMENDATION: Screening mammogram in one year.(Code:SM-B-01Y)  I have discussed the findings and recommendations with the patient. If applicable, a reminder letter will be sent to the patient regarding the next appointment. BI-RADS CATEGORY  1: Negative. Electronically Signed   By: Corean Salter M.D.   On: 07/06/2024 14:18   US  LIMITED ULTRASOUND INCLUDING AXILLA  LEFT BREAST  Result Date: 07/06/2024 CLINICAL DATA:  LEFT inner breast pain EXAM: DIGITAL DIAGNOSTIC BILATERAL MAMMOGRAM WITH TOMOSYNTHESIS AND CAD; ULTRASOUND LEFT BREAST LIMITED TECHNIQUE: Bilateral digital diagnostic mammography and breast tomosynthesis was performed. The images were evaluated with computer-aided detection. ; Targeted ultrasound examination of the left breast was performed. COMPARISON:  Previous exam(s). ACR Breast Density Category b: There are scattered areas of fibroglandular density. FINDINGS: Diagnostic tomosynthesis views were obtained over the area of pain in the LEFT breast. No suspicious mammographic finding is identified in this area. No suspicious mass, microcalcification, or other finding is identified in either breast. On physical exam, no suspicious mass is appreciated. Targeted LEFT breast ultrasound was performed in the area of pain at the inner breast. No suspicious solid or cystic mass is identified. IMPRESSION: 1. No mammographic or sonographic evidence of malignancy at the site of painful concern in the LEFT breast. Any further workup of the patient's symptoms should be based on the clinical assessment. Recommend routine annual screening mammogram in 1 year. 2. No mammographic evidence of malignancy bilaterally. 3. Breast pain is a common condition, which will often resolve on its own without intervention. It can be affected by hormonal changes, medication side effect, weight changes and fit of the bra. Pain may also be referred from other adjacent areas of the body. Breast pain may be improved by wearing adequate well-fitting support, over-the-counter topical and oral NSAID medication, low-fat diet, and ice/heat as needed. Studies have shown an improvement in cyclic pain with use of evening primrose oil, vitamin D  and vitamin E. Clinical follow-up recommended to discuss any further work-up recommendations and appropriate treatment. RECOMMENDATION: Screening mammogram in one  year.(Code:SM-B-01Y) I have discussed the findings and recommendations with the patient. If applicable, a reminder letter will be sent to the patient regarding the next appointment. BI-RADS CATEGORY  1: Negative. Electronically Signed   By: Corean Salter M.D.   On: 07/06/2024 14:18    Procedures Procedures (including critical care time)  Medications Ordered in UC Medications - No data to display  Initial Impression / Assessment and Plan / UC Course  I have reviewed the triage vital signs and the nursing notes.  Pertinent labs & imaging results that were available during my care of the patient were reviewed by me and considered in my medical decision making (see chart for details).     Reviewed exam and symptoms with patient.  Patient with 2 weeks of persistent knee pain with no known injury.  Exam is reassuring.  Will do trial of naproxen  and patient fitted with knee sleeve.  Discussed RICE therapy and PCP follow-up 1 week if symptoms do not improve.  ER precautions reviewed Final Clinical Impressions(s) / UC Diagnoses   Final diagnoses:  Acute pain of left knee     Discharge Instructions      Start naproxen  twice daily for 7 days.  Take this with food.  Use a knee sleeve to help support the joint.  Elevate and ice as needed.  Follow-up with your PCP in 1 week if symptoms do not improve.  Please go to the ER for any worsening symptoms.  Hope you feel better soon!  ED Prescriptions     Medication Sig Dispense Auth. Provider   naproxen  (NAPROSYN ) 500 MG tablet Take 1 tablet (500 mg total) by mouth 2 (two) times daily as needed (knee pain). 14 tablet Meridee Branum, Jodi R, NP      PDMP not reviewed this encounter.    [1]  Social History Tobacco Use   Smoking status: Former    Current packs/day: 0.00    Average packs/day: 0.1 packs/day for 2.0 years (0.2 ttl pk-yrs)    Types: Cigarettes    Start date: 05/24/2005    Quit date: 05/25/2007    Years since quitting: 17.1     Passive exposure: Past   Smokeless tobacco: Never  Vaping Use   Vaping status: Never Used  Substance Use Topics   Alcohol use: Yes    Alcohol/week: 1.0 standard drink of alcohol    Types: 1 Glasses of wine per week    Comment: occaional   Drug use: No     Loreda Myla SAUNDERS, NP 07/07/24 1718  "

## 2024-07-26 ENCOUNTER — Ambulatory Visit
Admission: EM | Admit: 2024-07-26 | Discharge: 2024-07-26 | Disposition: A | Discharge status: Home / Self Care | Source: Home / Self Care

## 2024-07-26 ENCOUNTER — Ambulatory Visit

## 2024-07-26 ENCOUNTER — Ambulatory Visit: Payer: Self-pay | Admitting: Nurse Practitioner

## 2024-07-26 DIAGNOSIS — W19XXXA Unspecified fall, initial encounter: Secondary | ICD-10-CM | POA: Diagnosis not present

## 2024-07-26 DIAGNOSIS — M25521 Pain in right elbow: Secondary | ICD-10-CM

## 2024-07-26 DIAGNOSIS — M25551 Pain in right hip: Secondary | ICD-10-CM

## 2024-07-26 NOTE — Discharge Instructions (Signed)
 The xrays of your hip and elbow are negative.  You may use over-the-counter Tylenol  ibuprofen , ice to the areas as needed and elevation.  Follow-up with your PCP in 2 to 3 days for recheck.  Please go to the emergency room if you develop any worsening symptoms.  Hope you feel better soon!

## 2024-07-26 NOTE — ED Provider Notes (Signed)
 " [facility]    CSN: [id number] Arrival date & time: 07/26/24  1158      History   Chief Complaint Chief Complaint  Patient presents with   Fall    HPI Breanna Miranda is a 39 y.o. female presents for fall. Pt reports today she sat on her porch while going to work.  States she landed on her right elbow and right hip.  Since then she is reporting right elbow and right hip/upper outer thigh pain.  She denies any neck pain, back pain, abdominal pain, rib pain, hand or wrist pain, knee pain, ankle or foot pain.  Denies any bruising or swelling.  States she just feels sore.  She does think she bumped the back of her head on the hand railing but denies LOC.  No headache.  Reports pain with walking.  No history of injuries or surgeries to the area in the past.  Took Tylenol  prior to arrival.  No other injuries or concerns at this time.   Fall    Past Medical History:  Diagnosis Date   Anemia    Anxiety    Asthma    Blood transfusion without reported diagnosis 2016   Chiari I malformation (HCC)    Complication of anesthesia    Patient states he heart beat decreases, shallow breathing   COVID 07/14/2020   Depression    Dyspnea    Empty sella    Family history of adverse reaction to anesthesia    mother heart beat decreases   GERD (gastroesophageal reflux disease)    Irregular heart rhythm    Low grade squamous intraepith lesion on cytologic smear cervix (lgsil) 01/15/2018   Menorrhagia     Patient Active Problem List   Diagnosis Date Noted   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   Urticaria 11/07/2022   It band syndrome, left 10/25/2022   Breast pain, left 10/25/2022   History of bilateral salpingectomy 10/17/2022   History of vaginal hysterectomy 10/17/2022   Frequent headaches 08/27/2022   Chronic throat clearing 03/23/2022   Palpitations 01/24/2022   1st degree AV block 01/24/2022   Thoracic back pain 10/09/2020   Breast pain 08/04/2020   External hemorrhoid  06/02/2020   Peripheral neuropathy 06/02/2020   Ganglion cyst 06/02/2020   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/25/2020   Gastroesophageal reflux disease without esophagitis 04/25/2020   Insomnia 10/15/2017   Pica in adults    Anxiety 08/10/2014   Anemia 05/06/2012    Past Surgical History:  Procedure Laterality Date   BREAST BIOPSY Left 12/2020   CHOLECYSTECTOMY     TUBAL LIGATION     VAGINAL HYSTERECTOMY Bilateral 09/17/2022   Procedure: VAGINAL HYSTERECTOMY WITH BILATERAL SALPINGECTOMY;  Surgeon: Lorence Ozell CROME, MD;  Location: [facility] OR;  Service: Gynecology;  Laterality: Bilateral;    OB History     Gravida  5   Para  4   Term  2   Preterm  2   AB  1   Living  4      SAB  1   IAB  0   Ectopic  0   Multiple  0   Live Births  4            Home Medications    Prior to Admission medications  Medication Sig Start Date End Date Taking? Authorizing Provider  acetaminophen  (TYLENOL ) 325 MG tablet Take 2 tablets (650 mg total) by mouth every 6 (six)  hours as needed for moderate pain (pain score 4-6). 05/10/24   Christopher Savannah, PA-C  albuterol  (VENTOLIN  HFA) 108 (90 Base) MCG/ACT inhaler Inhale 1-2 puffs into the lungs every 6 (six) hours as needed. 06/26/23   Flemon Kelty, Jodi R, NP  cholecalciferol (VITAMIN D3) 25 MCG (1000 UNIT) tablet Take 2,000 Units by mouth daily.    [provider]  Cyanocobalamin  (VITAMIN B-12 PO) Take by mouth.    [provider]  famotidine  (PEPCID ) 20 MG tablet Take 1 tablet (20 mg total) by mouth 2 (two) times daily. 05/10/24   Christopher Savannah, PA-C  fluticasone  (FLONASE ) 50 MCG/ACT nasal spray SHAKE LIQUID WELL AND USE 2 SPRAYS IN EACH NOSTRIL DAILY 04/14/24   Baloch, Mahnoor, MD  ibuprofen  (ADVIL ) 600 MG tablet Take 1 tablet (600 mg total) by mouth every 8 (eight) hours. 06/07/24   Baloch, Mahnoor, MD  loperamide  (IMODIUM ) 2 MG capsule Take 1 capsule (2 mg total) by mouth 2  (two) times daily as needed for diarrhea or loose stools. 05/10/24   Christopher Savannah, PA-C  Multiple Vitamins-Minerals (ZINC PO) Take 1 tablet by mouth in the morning.    [provider]  naproxen  (NAPROSYN ) 500 MG tablet Take 1 tablet (500 mg total) by mouth 2 (two) times daily as needed (knee pain). 07/07/24   Kailan Laws, Jodi R, NP  ondansetron  (ZOFRAN -ODT) 4 MG disintegrating tablet Take 1 tablet (4 mg total) by mouth every 8 (eight) hours as needed for nausea or vomiting. 05/10/24   Christopher Savannah, PA-C  pantoprazole  (PROTONIX ) 40 MG tablet Take 1 tablet (40 mg total) by mouth 2 (two) times daily. Patient not taking: Reported on 06/22/2024 05/19/24   Adele Song, MD  propranolol  (INDERAL ) 10 MG tablet Take 1 tablet (10 mg total) by mouth 2 (two) times daily as needed (For Palpitations). 06/22/24   Loistine Sober, NP  sucralfate  (CARAFATE ) 1 g tablet Take 1 tablet (1 g total) by mouth 4 (four) times daily -  with meals and at bedtime. 05/28/24 06/27/24  Logan Ubaldo NOVAK, PA-C  montelukast  (SINGULAIR ) 10 MG tablet Take 1 tablet (10 mg total) by mouth at bedtime. Patient not taking: Reported on 06/22/2024 05/12/18 08/02/19  Anders Otto DASEN, MD    Family History Family History  Problem Relation Age of Onset   Diabetes Father    Kidney disease Father        failure due to diabetes   Hypertension Mother    Heart attack Maternal Grandmother 66    Social History Social History[1]   Allergies   Penicillins and Shrimp [shellfish allergy]   Review of Systems Review of Systems  Musculoskeletal:        Right elbow and hip pain after fall.     Physical Exam Triage Vital Signs ED Triage Vitals  Encounter Vitals Group     BP 07/26/24 1231 120/76     Girls Systolic BP Percentile --      Girls Diastolic BP Percentile --      Boys Systolic BP Percentile --      Boys Diastolic BP Percentile --      Pulse Rate 07/26/24 1231 75     Resp 07/26/24 1231 18     Temp 07/26/24 1231 98.1 F  (36.7 C)     Temp src --      SpO2 07/26/24 1231 98 %     Weight --      Height --      Head Circumference --  Peak Flow --      Pain Score 07/26/24 1229 10     Pain Loc --      Pain Education --      Exclude from Growth Chart --    No data found.  Updated Vital Signs BP 120/76   Pulse 75   Temp 98.1 F (36.7 C)   Resp 18   LMP 06/26/2022   SpO2 98%   Visual Acuity Right Eye Distance:   Left Eye Distance:   Bilateral Distance:    Right Eye Near:   Left Eye Near:    Bilateral Near:     Physical Exam Vitals and nursing note reviewed.  Constitutional:      General: She is not in acute distress.    Appearance: Normal appearance. She is not ill-appearing.  HENT:     Head: Normocephalic and atraumatic.  Eyes:     Pupils: Pupils are equal, round, and reactive to light.  Cardiovascular:     Rate and Rhythm: Normal rate.  Pulmonary:     Effort: Pulmonary effort is normal.  Musculoskeletal:     Right shoulder: No tenderness or bony tenderness.     Right upper arm: No tenderness or bony tenderness.     Right elbow: No swelling, deformity, effusion or lacerations. Normal range of motion. Tenderness present in radial head, medial epicondyle, lateral epicondyle and olecranon process.     Right forearm: No tenderness or bony tenderness.     Right wrist: No tenderness or bony tenderness.     Right hand: No tenderness or bony tenderness.     Cervical back: No tenderness or bony tenderness.     Thoracic back: No tenderness or bony tenderness.     Lumbar back: No tenderness or bony tenderness.     Right hip: Tenderness and bony tenderness present. No deformity, lacerations or crepitus. Normal range of motion. Normal strength.     Comments: Strength 5 out of 5 bilateral lower extremities.  Pain with external and internal rotation of the right hip  Skin:    General: Skin is warm and dry.  Neurological:     General: No focal deficit present.     Mental Status: She is alert  and oriented to person, place, and time.  Psychiatric:        Mood and Affect: Mood normal.        Behavior: Behavior normal.      UC Treatments / Results  Labs (all labs ordered are listed, but only abnormal results are displayed) Labs Reviewed - No data to display  EKG   Radiology DG Hip Unilat With Pelvis 2-3 Views Right Result Date: 07/26/2024 CLINICAL DATA:  Status post fall. EXAM: DG HIP (WITH OR WITHOUT PELVIS) 2-3V RIGHT COMPARISON:  None Available. FINDINGS: There is no evidence of hip fracture or dislocation. There is no evidence of arthropathy or other focal bone abnormality. IMPRESSION: Negative. Electronically Signed   By: Suzen Dials M.D.   On: 07/26/2024 13:17   DG Elbow Complete Right Result Date: 07/26/2024 CLINICAL DATA:  Fall, slipped on the ice EXAM: RIGHT ELBOW - COMPLETE 3 VIEW COMPARISON:  None Available. FINDINGS: There is no evidence of fracture, dislocation, or joint effusion. There is no evidence of arthropathy or other focal bone abnormality. Soft tissues are unremarkable. IMPRESSION: Negative. Electronically Signed   By: Wilkie Lent M.D.   On: 07/26/2024 13:17    Procedures Procedures (including critical care time)  Medications Ordered in UC  Medications - No data to display  Initial Impression / Assessment and Plan / UC Course  I have reviewed the triage vital signs and the nursing notes.  Pertinent labs & imaging results that were available during my care of the patient were reviewed by me and considered in my medical decision making (see chart for details).     Reviewed exam and symptoms with patient.  X-ray of elbow and hip negative.  Discussed likely soft tissue injury/underlying contusion and RICE therapy.  May continue OTC analgesics as needed.  PCP follow-up 2 to 3 days for recheck.  ER precautions reviewed. Final Clinical Impressions(s) / UC Diagnoses   Final diagnoses:  Right hip pain  Right elbow pain  Fall from standing,  initial encounter     Discharge Instructions      The xrays of your hip and elbow are negative.  You may use over-the-counter Tylenol  ibuprofen , ice to the areas as needed and elevation.  Follow-up with your PCP in 2 to 3 days for recheck.  Please go to the emergency room if you develop any worsening symptoms.  Hope you feel better soon!     ED Prescriptions   None    PDMP not reviewed this encounter.     [1]  Social History Tobacco Use   Smoking status: Former    Current packs/day: 0.00    Average packs/day: 0.1 packs/day for 2.0 years (0.2 ttl pk-yrs)    Types: Cigarettes    Start date: 05/24/2005    Quit date: 05/25/2007    Years since quitting: 17.1    Passive exposure: Past   Smokeless tobacco: Never  Vaping Use   Vaping status: Never Used  Substance Use Topics   Alcohol use: Yes    Alcohol/week: 1.0 standard drink of alcohol    Types: 1 Glasses of wine per week    Comment: occaional   Drug use: No     Loreda Myla SAUNDERS, NP 07/26/24 1328  "

## 2024-09-22 ENCOUNTER — Ambulatory Visit: Admitting: Student
